# Patient Record
Sex: Female | Born: 1940 | Race: White | Hispanic: No | Marital: Married | Smoking: Former smoker
Health system: Southern US, Community
[De-identification: ages and names within clinical notes are randomized; demographics above are authoritative.]

## PROBLEM LIST (undated history)

## (undated) DIAGNOSIS — H25019 Cortical age-related cataract, unspecified eye: Secondary | ICD-10-CM

## (undated) DIAGNOSIS — R0600 Dyspnea, unspecified: Secondary | ICD-10-CM

## (undated) DIAGNOSIS — K219 Gastro-esophageal reflux disease without esophagitis: Secondary | ICD-10-CM

## (undated) DIAGNOSIS — E538 Deficiency of other specified B group vitamins: Secondary | ICD-10-CM

## (undated) DIAGNOSIS — E039 Hypothyroidism, unspecified: Secondary | ICD-10-CM

## (undated) DIAGNOSIS — J449 Chronic obstructive pulmonary disease, unspecified: Secondary | ICD-10-CM

## (undated) DIAGNOSIS — D649 Anemia, unspecified: Secondary | ICD-10-CM

## (undated) DIAGNOSIS — B029 Zoster without complications: Secondary | ICD-10-CM

## (undated) DIAGNOSIS — C801 Malignant (primary) neoplasm, unspecified: Secondary | ICD-10-CM

## (undated) DIAGNOSIS — J439 Emphysema, unspecified: Secondary | ICD-10-CM

## (undated) DIAGNOSIS — E785 Hyperlipidemia, unspecified: Secondary | ICD-10-CM

## (undated) DIAGNOSIS — I10 Essential (primary) hypertension: Secondary | ICD-10-CM

## (undated) DIAGNOSIS — M25551 Pain in right hip: Secondary | ICD-10-CM

## (undated) DIAGNOSIS — Z974 Presence of external hearing-aid: Secondary | ICD-10-CM

## (undated) DIAGNOSIS — I700 Atherosclerosis of aorta: Secondary | ICD-10-CM

## (undated) DIAGNOSIS — I2510 Atherosclerotic heart disease of native coronary artery without angina pectoris: Secondary | ICD-10-CM

## (undated) DIAGNOSIS — M1612 Unilateral primary osteoarthritis, left hip: Secondary | ICD-10-CM

## (undated) DIAGNOSIS — Z8719 Personal history of other diseases of the digestive system: Secondary | ICD-10-CM

## (undated) DIAGNOSIS — M810 Age-related osteoporosis without current pathological fracture: Secondary | ICD-10-CM

## (undated) HISTORY — PX: CATARACT EXTRACTION: SUR2

## (undated) HISTORY — PX: TUBAL LIGATION: SHX77

---

## 1968-04-05 HISTORY — PX: TUBAL LIGATION: SHX77

## 2004-03-27 ENCOUNTER — Ambulatory Visit: Payer: Self-pay | Admitting: Internal Medicine

## 2004-05-26 ENCOUNTER — Ambulatory Visit: Payer: Self-pay | Admitting: Internal Medicine

## 2004-11-11 ENCOUNTER — Ambulatory Visit: Payer: Self-pay

## 2005-02-04 ENCOUNTER — Ambulatory Visit: Payer: Self-pay | Admitting: Urology

## 2005-04-05 HISTORY — PX: COLONOSCOPY: SHX174

## 2005-05-27 ENCOUNTER — Ambulatory Visit: Payer: Self-pay | Admitting: Internal Medicine

## 2006-03-31 ENCOUNTER — Ambulatory Visit: Payer: Self-pay | Admitting: Gastroenterology

## 2006-05-30 ENCOUNTER — Ambulatory Visit: Payer: Self-pay | Admitting: Internal Medicine

## 2006-09-21 ENCOUNTER — Ambulatory Visit: Payer: Self-pay | Admitting: Internal Medicine

## 2007-06-27 ENCOUNTER — Ambulatory Visit: Payer: Self-pay | Admitting: Internal Medicine

## 2008-06-27 ENCOUNTER — Ambulatory Visit: Payer: Self-pay | Admitting: Internal Medicine

## 2009-07-14 ENCOUNTER — Ambulatory Visit: Payer: Self-pay | Admitting: Internal Medicine

## 2010-08-06 ENCOUNTER — Ambulatory Visit: Payer: Self-pay | Admitting: Internal Medicine

## 2010-08-15 ENCOUNTER — Emergency Department: Payer: Self-pay | Admitting: Unknown Physician Specialty

## 2010-09-06 ENCOUNTER — Ambulatory Visit: Payer: Self-pay | Admitting: Family Medicine

## 2011-08-12 ENCOUNTER — Ambulatory Visit: Payer: Self-pay | Admitting: Internal Medicine

## 2012-08-31 ENCOUNTER — Ambulatory Visit: Payer: Self-pay | Admitting: Internal Medicine

## 2013-01-09 ENCOUNTER — Ambulatory Visit: Payer: Self-pay | Admitting: Internal Medicine

## 2013-04-30 ENCOUNTER — Ambulatory Visit: Payer: Self-pay | Admitting: Internal Medicine

## 2013-09-25 ENCOUNTER — Ambulatory Visit: Payer: Self-pay | Admitting: Internal Medicine

## 2013-11-06 DIAGNOSIS — D519 Vitamin B12 deficiency anemia, unspecified: Secondary | ICD-10-CM | POA: Insufficient documentation

## 2013-11-11 ENCOUNTER — Ambulatory Visit: Payer: Self-pay | Admitting: Internal Medicine

## 2013-11-11 LAB — URINALYSIS, COMPLETE: WBC UR: >30 /HPF (ref 0–5)

## 2013-11-13 LAB — URINE CULTURE

## 2014-05-04 ENCOUNTER — Ambulatory Visit: Payer: Self-pay | Admitting: Emergency Medicine

## 2014-05-04 LAB — URINALYSIS, COMPLETE: WBC UR: >30 /HPF (ref 0–5)

## 2014-05-06 LAB — URINE CULTURE

## 2014-07-19 ENCOUNTER — Other Ambulatory Visit: Payer: Self-pay | Admitting: Internal Medicine

## 2014-07-19 DIAGNOSIS — Z1231 Encounter for screening mammogram for malignant neoplasm of breast: Secondary | ICD-10-CM

## 2014-10-01 ENCOUNTER — Ambulatory Visit
Admission: RE | Admit: 2014-10-01 | Discharge: 2014-10-01 | Disposition: A | Discharge status: Home / Self Care | Payer: Medicare PPO | Source: Ambulatory Visit | Attending: Internal Medicine | Admitting: Internal Medicine

## 2014-10-01 DIAGNOSIS — Z1231 Encounter for screening mammogram for malignant neoplasm of breast: Secondary | ICD-10-CM

## 2015-09-19 ENCOUNTER — Other Ambulatory Visit: Payer: Self-pay | Admitting: Internal Medicine

## 2015-09-19 DIAGNOSIS — Z1231 Encounter for screening mammogram for malignant neoplasm of breast: Secondary | ICD-10-CM

## 2015-10-22 ENCOUNTER — Ambulatory Visit
Admission: RE | Admit: 2015-10-22 | Discharge: 2015-10-22 | Disposition: A | Discharge status: Home / Self Care | Payer: Medicare PPO | Source: Ambulatory Visit | Attending: Internal Medicine | Admitting: Internal Medicine

## 2015-10-22 DIAGNOSIS — Z1231 Encounter for screening mammogram for malignant neoplasm of breast: Secondary | ICD-10-CM | POA: Insufficient documentation

## 2016-06-22 ENCOUNTER — Encounter: Payer: Self-pay | Admitting: *Deleted

## 2016-06-23 ENCOUNTER — Ambulatory Visit
Admission: RE | Admit: 2016-06-23 | Payer: Medicare HMO | Source: Ambulatory Visit | Admitting: Unknown Physician Specialty

## 2016-06-23 ENCOUNTER — Encounter: Admission: RE | Payer: Self-pay | Source: Ambulatory Visit

## 2016-06-23 ENCOUNTER — Encounter
Admission: RE | Disposition: A | Discharge status: Home / Self Care | Payer: Self-pay | Source: Ambulatory Visit | Attending: Unknown Physician Specialty

## 2016-06-23 ENCOUNTER — Ambulatory Visit: Payer: Medicare HMO | Admitting: Anesthesiology

## 2016-06-23 ENCOUNTER — Ambulatory Visit
Admission: RE | Admit: 2016-06-23 | Discharge: 2016-06-23 | Disposition: A | Discharge status: Home / Self Care | Payer: Medicare HMO | Source: Ambulatory Visit | Attending: Unknown Physician Specialty | Admitting: Unknown Physician Specialty

## 2016-06-23 ENCOUNTER — Encounter: Payer: Self-pay | Admitting: *Deleted

## 2016-06-23 DIAGNOSIS — Z79899 Other long term (current) drug therapy: Secondary | ICD-10-CM | POA: Insufficient documentation

## 2016-06-23 DIAGNOSIS — K5730 Diverticulosis of large intestine without perforation or abscess without bleeding: Secondary | ICD-10-CM | POA: Insufficient documentation

## 2016-06-23 DIAGNOSIS — K640 First degree hemorrhoids: Secondary | ICD-10-CM | POA: Diagnosis not present

## 2016-06-23 DIAGNOSIS — J449 Chronic obstructive pulmonary disease, unspecified: Secondary | ICD-10-CM | POA: Insufficient documentation

## 2016-06-23 DIAGNOSIS — E785 Hyperlipidemia, unspecified: Secondary | ICD-10-CM | POA: Diagnosis not present

## 2016-06-23 DIAGNOSIS — K621 Rectal polyp: Secondary | ICD-10-CM | POA: Insufficient documentation

## 2016-06-23 DIAGNOSIS — D649 Anemia, unspecified: Secondary | ICD-10-CM | POA: Insufficient documentation

## 2016-06-23 DIAGNOSIS — Z1211 Encounter for screening for malignant neoplasm of colon: Secondary | ICD-10-CM | POA: Insufficient documentation

## 2016-06-23 DIAGNOSIS — K219 Gastro-esophageal reflux disease without esophagitis: Secondary | ICD-10-CM | POA: Insufficient documentation

## 2016-06-23 DIAGNOSIS — Z8371 Family history of colonic polyps: Secondary | ICD-10-CM | POA: Insufficient documentation

## 2016-06-23 DIAGNOSIS — E039 Hypothyroidism, unspecified: Secondary | ICD-10-CM | POA: Insufficient documentation

## 2016-06-23 DIAGNOSIS — Z87891 Personal history of nicotine dependence: Secondary | ICD-10-CM | POA: Diagnosis not present

## 2016-06-23 HISTORY — PX: COLONOSCOPY: SHX5424

## 2016-06-23 HISTORY — DX: Hypothyroidism, unspecified: E03.9

## 2016-06-23 HISTORY — DX: Gastro-esophageal reflux disease without esophagitis: K21.9

## 2016-06-23 HISTORY — DX: Hyperlipidemia, unspecified: E78.5

## 2016-06-23 HISTORY — DX: Anemia, unspecified: D64.9

## 2016-06-23 SURGERY — COLONOSCOPY WITH PROPOFOL
Anesthesia: General

## 2016-06-23 SURGERY — COLONOSCOPY
Anesthesia: General

## 2016-06-23 MED ORDER — LIDOCAINE HCL (PF) 2 % IJ SOLN
INTRAMUSCULAR | Status: AC
  Filled 2016-06-23: qty 2

## 2016-06-23 MED ORDER — FENTANYL CITRATE (PF) 100 MCG/2ML IJ SOLN
INTRAMUSCULAR | Status: DC | PRN
  Administered 2016-06-23 (×2): 50 ug via INTRAVENOUS

## 2016-06-23 MED ORDER — PROPOFOL 10 MG/ML IV BOLUS
INTRAVENOUS | Status: DC | PRN
  Administered 2016-06-23: 10 mg via INTRAVENOUS
  Administered 2016-06-23: 30 mg via INTRAVENOUS

## 2016-06-23 MED ORDER — SODIUM CHLORIDE 0.9 % IV SOLN: INTRAVENOUS | Status: DC

## 2016-06-23 MED ORDER — LIDOCAINE HCL (PF) 2 % IJ SOLN
INTRAMUSCULAR | Status: DC | PRN
Start: 2016-06-23 — End: 2016-06-23
  Administered 2016-06-23: 50 mg

## 2016-06-23 MED ORDER — MIDAZOLAM HCL 2 MG/2ML IJ SOLN
INTRAMUSCULAR | Status: AC
  Filled 2016-06-23: qty 2

## 2016-06-23 MED ORDER — PROPOFOL 500 MG/50ML IV EMUL
INTRAVENOUS | Status: DC | PRN
  Administered 2016-06-23: 50 ug/kg/min via INTRAVENOUS

## 2016-06-23 MED ORDER — MIDAZOLAM HCL 5 MG/5ML IJ SOLN
INTRAMUSCULAR | Status: DC | PRN
  Administered 2016-06-23 (×2): 1 mg via INTRAVENOUS

## 2016-06-23 MED ORDER — SODIUM CHLORIDE 0.9 % IV SOLN
INTRAVENOUS | Status: DC
  Administered 2016-06-23: 1000 mL via INTRAVENOUS

## 2016-06-23 MED ORDER — FENTANYL CITRATE (PF) 100 MCG/2ML IJ SOLN
INTRAMUSCULAR | Status: AC
  Filled 2016-06-23: qty 2

## 2016-06-23 NOTE — Anesthesia Preprocedure Evaluation (Signed)
Anesthesia Evaluation  Patient identified by MRN, date of birth, ID band Patient awake    Reviewed: Allergy & Precautions, NPO status , Patient's Chart, lab work & pertinent test results  Airway Mallampati: II       Dental  (+) Teeth Intact   Pulmonary neg pulmonary ROS, COPD, former smoker,     + decreased breath sounds      Cardiovascular Exercise Tolerance: Good  Rhythm:Regular     Neuro/Psych negative neurological ROS     GI/Hepatic Neg liver ROS, GERD  Medicated,  Endo/Other    Renal/GU negative Renal ROS     Musculoskeletal   Abdominal Normal abdominal exam  (+)   Peds  Hematology  (+) anemia ,   Anesthesia Other Findings   Reproductive/Obstetrics                             Anesthesia Physical Anesthesia Plan  ASA: II  Anesthesia Plan: General   Post-op Pain Management:    Induction: Intravenous  Airway Management Planned: Natural Airway and Nasal Cannula  Additional Equipment:   Intra-op Plan:   Post-operative Plan:   Informed Consent: I have reviewed the patients History and Physical, chart, labs and discussed the procedure including the risks, benefits and alternatives for the proposed anesthesia with the patient or authorized representative who has indicated his/her understanding and acceptance.     Plan Discussed with: CRNA  Anesthesia Plan Comments:         Anesthesia Quick Evaluation

## 2016-06-23 NOTE — Transfer of Care (Signed)
Immediate Anesthesia Transfer of Care Note  Patient: Kelly Wood  Procedure(s) Performed: Procedure(s): COLONOSCOPY (N/A)  Patient Location: PACU  Anesthesia Type:General  Level of Consciousness: sedated  Airway & Oxygen Therapy: Patient Spontanous Breathing and Patient connected to nasal cannula oxygen  Post-op Assessment: Report given to RN and Post -op Vital signs reviewed and stable  Post vital signs: Reviewed and stable  Last Vitals:  Vitals:   06/23/16 0849 06/23/16 0953  BP: 138/86 98/62  Pulse: 68 64  Resp: (!) 22 11  Temp: 36.3 C (!) 36.1 C    Last Pain:  Vitals:   06/23/16 0953  TempSrc: Tympanic         Complications: No apparent anesthesia complications

## 2016-06-23 NOTE — Op Note (Signed)
[facility] Patient Name: Kelly Wood Procedure Date: 06/23/2016 9:07 AM MRN: [id number] Account #: [id number] Date of Birth: 09-07-1940 Admit Type: Outpatient Age: 76 Room: [facility] ENDO ROOM 4 Gender: Female Note Status: Finalized Procedure:            Colonoscopy Indications:          Colon cancer screening in patient at increased risk:                        Family history of 1st-degree relative with colon polyps Providers:            Manya Silvas, MD Referring MD:         Leonie Douglas. Doy Hutching, MD (Referring MD) Medicines:            Propofol per Anesthesia Complications:        No immediate complications. Procedure:            Pre-Anesthesia Assessment:                       - After reviewing the risks and benefits, the patient                        was deemed in satisfactory condition to undergo the                        procedure.                       After obtaining informed consent, the colonoscope was                        passed under direct vision. Throughout the procedure,                        the patient's blood pressure, pulse, and oxygen                        saturations were monitored continuously. The Olympus                        CF-HQ190L Colonoscope (S#. [id number]) was introduced                        through the anus and advanced to the the cecum,                        identified by appendiceal orifice and ileocecal valve.                        The colonoscopy was performed without difficulty. The                        patient tolerated the procedure well. The quality of                        the bowel preparation was excellent. Findings:      A small polyp was found in the recto-sigmoid colon. The polyp was       sessile. The polyp was removed with a hot snare. Resection and retrieval       were complete.  Many small and large-mouthed diverticula were found in the sigmoid       colon, descending colon and  transverse colon.      Internal hemorrhoids were found during endoscopy. The hemorrhoids were       small and Grade I (internal hemorrhoids that do not prolapse).      The exam was otherwise without abnormality. Impression:           - One small polyp at the recto-sigmoid colon, removed                        with a hot snare. Resected and retrieved.                       - Diverticulosis in the sigmoid colon, in the                        descending colon and in the transverse colon.                       - Internal hemorrhoids.                       - The examination was otherwise normal. Recommendation:       - Await pathology results. Manya Silvas, MD 06/23/2016 9:55:03 AM This report has been signed electronically. Number of Addenda: 0 Note Initiated On: 06/23/2016 9:07 AM Scope Withdrawal Time: 0 hours 10 minutes 58 seconds  Total Procedure Duration: 0 hours 27 minutes 21 seconds       [facility]

## 2016-06-23 NOTE — H&P (Signed)
   Primary Care Physician:  Idelle Crouch, MD Primary Gastroenterologist:  Dr. Vira Agar  Pre-Procedure History & Physical: HPI:  Kelly Wood is a 76 y.o. female is here for an colonoscopy.   Past Medical History:  Diagnosis Date  . Anemia   . GERD (gastroesophageal reflux disease)   . Hyperlipidemia   . Hypothyroidism     Past Surgical History:  Procedure Laterality Date  . TUBAL LIGATION      Prior to Admission medications   Medication Sig Start Date End Date Taking? Authorizing Provider  diltiazem (CARDIZEM CD) 240 MG 24 hr capsule Take 240 mg by mouth daily.   Yes Historical Provider, MD  levothyroxine (SYNTHROID, LEVOTHROID) 75 MCG tablet Take 75 mcg by mouth daily before breakfast.   Yes Historical Provider, MD  potassium chloride SA (K-DUR,KLOR-CON) 20 MEQ tablet Take 20 mEq by mouth 2 (two) times daily.   Yes Historical Provider, MD  pravastatin (PRAVACHOL) 80 MG tablet Take 80 mg by mouth daily.   Yes Historical Provider, MD  torsemide (DEMADEX) 20 MG tablet Take 20 mg by mouth daily.   Yes Historical Provider, MD    Allergies as of 05/25/2016  . (Not on File)    Family History  Problem Relation Age of Onset  . Breast cancer Neg Hx     Social History   Social History  . Marital status: Married    Spouse name: N/A  . Number of children: N/A  . Years of education: N/A   Occupational History  . Not on file.   Social History Main Topics  . Smoking status: Former Smoker    Quit date: 10/24/2006  . Smokeless tobacco: Never Used  . Alcohol use Not on file  . Drug use: Unknown  . Sexual activity: Not on file   Other Topics Concern  . Not on file   Social History Narrative  . No narrative on file    Review of Systems: See HPI, otherwise negative ROS  Physical Exam: BP 138/86   Pulse 68   Temp 97.3 F (36.3 C) (Tympanic)   Resp (!) 22   Ht 5\' 1"  (1.549 m)   Wt 63.5 kg (140 lb)   SpO2 98%   BMI 26.45 kg/m  General:   Alert,  pleasant and  cooperative in NAD Head:  Normocephalic and atraumatic. Neck:  Supple; no masses or thyromegaly. Lungs:  Clear throughout to auscultation.    Heart:  Regular rate and rhythm. Abdomen:  Soft, nontender and nondistended. Normal bowel sounds, without guarding, and without rebound.   Neurologic:  Alert and  oriented x4;  grossly normal neurologically.  Impression/Plan: Kelly Wood is here for an colonoscopy to be performed for screening for family history of colon polyps  Risks, benefits, limitations, and alternatives regarding  colonoscopy have been reviewed with the patient.  Questions have been answered.  All parties agreeable.   Gaylyn Cheers, MD  06/23/2016, 9:15 AM

## 2016-06-23 NOTE — Anesthesia Postprocedure Evaluation (Signed)
Anesthesia Post Note  Patient: Kelly Wood  Procedure(s) Performed: Procedure(s) (LRB): COLONOSCOPY (N/A)  Patient location during evaluation: PACU Anesthesia Type: General Level of consciousness: awake Pain management: pain level controlled Vital Signs Assessment: post-procedure vital signs reviewed and stable Respiratory status: spontaneous breathing Cardiovascular status: stable Anesthetic complications: no     Last Vitals:  Vitals:   06/23/16 1013 06/23/16 1023  BP: 137/77 (!) 142/79  Pulse: 60 63  Resp: 14 13  Temp:      Last Pain:  Vitals:   06/23/16 1023  TempSrc:   PainSc: 0-No pain                 [name],Kelly Fasching

## 2016-06-23 NOTE — Anesthesia Post-op Follow-up Note (Cosign Needed)
Anesthesia QCDR form completed.        

## 2016-06-24 ENCOUNTER — Encounter: Payer: Self-pay | Admitting: Unknown Physician Specialty

## 2016-06-24 LAB — SURGICAL PATHOLOGY

## 2016-07-14 ENCOUNTER — Encounter: Payer: Self-pay | Admitting: *Deleted

## 2016-07-14 ENCOUNTER — Ambulatory Visit
Admission: EM | Admit: 2016-07-14 | Discharge: 2016-07-14 | Disposition: A | Discharge status: Home / Self Care | Payer: Medicare HMO | Attending: Family Medicine | Admitting: Family Medicine

## 2016-07-14 DIAGNOSIS — H6692 Otitis media, unspecified, left ear: Secondary | ICD-10-CM

## 2016-07-14 DIAGNOSIS — H6123 Impacted cerumen, bilateral: Secondary | ICD-10-CM

## 2016-07-14 MED ORDER — AMOXICILLIN 875 MG PO TABS: 875.0000 mg | ORAL_TABLET | Freq: Two times a day (BID) | ORAL | 0 refills | Status: DC

## 2016-07-14 NOTE — ED Triage Notes (Signed)
Left ear pain and fullness x 2 days.

## 2016-07-14 NOTE — Discharge Instructions (Signed)
Take medication as prescribed. Rest. Drink plenty of fluids.  ° °Follow up with your primary care physician this week as needed. Return to Urgent care for new or worsening concerns.  ° °

## 2016-07-14 NOTE — ED Provider Notes (Signed)
[facility] ____________________________________________  Time seen: Approximately 11:42 AM  I have reviewed the triage vital signs and the nursing notes.   HISTORY  Chief Complaint Otalgia   HPI Kelly Wood is a 76 y.o. female presenting for evaluation of left ear discomfort and stopped up feeling. Patient reports that she has had a similar sensation in the past with cerumen impaction, but reports this time has more tenderness. Denies any recent injury or trauma. Denies tinnitus. Denies pain radiation. States mild left ear pain at this time. Has not tried any over-the-counter medications or treatments. Patient denies fevers. Reports otherwise feels well. Denies recent sickness, cough, congestion or sore throat.  Denies chest pain, shortness of breath, abdominal pain, or rash. Denies recent sickness. Denies recent antibiotic use. Denies renal insufficiency.   SPARKS,JEFFREY D, MD: PCP   Past Medical History:  Diagnosis Date  . Anemia   . GERD (gastroesophageal reflux disease)   . Hyperlipidemia   . Hypothyroidism     There are no active problems to display for this patient.   Past Surgical History:  Procedure Laterality Date  . COLONOSCOPY N/A 06/23/2016   Procedure: COLONOSCOPY;  Surgeon: Manya Silvas, MD;  Location: [facility] ENDOSCOPY;  Service: Endoscopy;  Laterality: N/A;  . TUBAL LIGATION       No current facility-administered medications for this encounter.   Current Outpatient Prescriptions:  .  diltiazem (CARDIZEM CD) 240 MG 24 hr capsule, Take 240 mg by mouth daily., Disp: , Rfl:  .  levothyroxine (SYNTHROID, LEVOTHROID) 75 MCG tablet, Take 75 mcg by mouth daily before breakfast., Disp: , Rfl:  .  potassium chloride SA (K-DUR,KLOR-CON) 20 MEQ tablet, Take 20 mEq by mouth 2 (two) times daily., Disp: , Rfl:  .  pravastatin (PRAVACHOL) 80 MG tablet, Take 80 mg by mouth daily., Disp: , Rfl:  .  torsemide (DEMADEX) 20 MG tablet, Take 20 mg by mouth  daily., Disp: , Rfl:  .  amoxicillin (AMOXIL) 875 MG tablet, Take 1 tablet (875 mg total) by mouth 2 (two) times daily., Disp: 20 tablet, Rfl: 0  Allergies Sulfa antibiotics  Family History  Problem Relation Age of Onset  . Breast cancer Neg Hx     Social History Social History  Substance Use Topics  . Smoking status: Former Smoker    Quit date: 10/24/2006  . Smokeless tobacco: Never Used  . Alcohol use No    Review of Systems Constitutional: No fever/chills Eyes: No visual changes. ENT: No sore throat. As above.  Cardiovascular: Denies chest pain. Respiratory: Denies shortness of breath. Gastrointestinal: No abdominal pain.   Genitourinary: Negative for dysuria. Musculoskeletal: Negative for back pain. Skin: Negative for rash. ____________________________________________   PHYSICAL EXAM:  VITAL SIGNS: ED Triage Vitals  Enc Vitals Group     BP 07/14/16 1106 127/62     Pulse Rate 07/14/16 1106 64     Resp 07/14/16 1106 16     Temp 07/14/16 1106 97.7 F (36.5 C)     Temp Source 07/14/16 1106 Oral     SpO2 07/14/16 1106 100 %     Weight 07/14/16 1108 140 lb (63.5 kg)     Height 07/14/16 1108 5\' 1"  (1.549 m)     Head Circumference --      Peak Flow --      Pain Score --      Pain Loc --      Pain Edu? --      Excl. in [place]? --  Constitutional: Alert and oriented. Well appearing and in no acute distress. Eyes: Conjunctivae are normal. PERRL. EOMI. ENT      Head: Normocephalic and atraumatic.      Ears: Right: Nontender, total cerumen impaction. Left: Nontender, cerumen impaction present. Post cerumen removal with irrigation as well as curette reevaluated. Right: Canal clear, no erythema, normal TM. Left: Canal clear, moderate erythema and dullness. The surrounding tenderness, swelling or erythema bilaterally.      Nose: No congestion/rhinnorhea.      Mouth/Throat: Mucous membranes are moist.Oropharynx non-erythematous. Neck: No stridor. Supple without  meningismus.  Hematological/Lymphatic/Immunilogical: No cervical lymphadenopathy. Cardiovascular: Normal rate, regular rhythm. Grossly normal heart sounds.  Good peripheral circulation. Respiratory: Normal respiratory effort without tachypnea nor retractions. Breath sounds are clear and equal bilaterally. No wheezes, rales, rhonchi. Musculoskeletal:No midline cervical, thoracic or lumbar tenderness to palpation.  Neurologic:  Normal speech and language. Speech is normal. No gait instability.  Skin:  Skin is warm, dry and intact. No rash noted. Psychiatric: Mood and affect are normal. Speech and behavior are normal. Patient exhibits appropriate insight and judgment   ___________________________________________   LABS (all labs ordered are listed, but only abnormal results are displayed)  Labs Reviewed - No data to display  PROCEDURES Procedures   Ceruminosis is noted. Procedure explained and verbal consent obtained. Wax is removed by irrigation and curette manual debridement. Patient tolerated well. Instructions for home care to prevent wax buildup are given. __________________________________________   INITIAL IMPRESSION / ASSESSMENT AND PLAN / ED COURSE  Pertinent labs & imaging results that were available during my care of the patient were reviewed by me and considered in my medical decision making (see chart for details).  Well appearing patient. No acute distress. Cerumen impaction removed. Patient tolerated well. Left otitis media. Discussed with patient monitoring and conservative therapy and may resolve without antibiotics, patient expressed concern of not previously having pain in the left ear and owuld like to start antibiotic prescription, will treat patient with oral amoxicillin.Discussed indication, risks and benefits of medications with patient.  Discussed follow up with Primary care physician this week. Discussed follow up and return parameters including no resolution or  any worsening concerns. Patient verbalized understanding and agreed to plan.   ____________________________________________   FINAL CLINICAL IMPRESSION(S) / ED DIAGNOSES  Final diagnoses:  Left otitis media, unspecified otitis media type  Bilateral impacted cerumen     Discharge Medication List as of 07/14/2016 12:22 PM    START taking these medications   Details  amoxicillin (AMOXIL) 875 MG tablet Take 1 tablet (875 mg total) by mouth 2 (two) times daily., Starting Wed 07/14/2016, Normal        Note: This dictation was prepared with Dragon dictation along with smaller phrase technology. Any transcriptional errors that result from this process are unintentional.         Marylene Land, NP 07/14/16 1610

## 2016-11-24 ENCOUNTER — Other Ambulatory Visit: Payer: Self-pay | Admitting: Internal Medicine

## 2016-11-24 DIAGNOSIS — Z1231 Encounter for screening mammogram for malignant neoplasm of breast: Secondary | ICD-10-CM

## 2016-12-15 ENCOUNTER — Ambulatory Visit
Admission: RE | Admit: 2016-12-15 | Discharge: 2016-12-15 | Disposition: A | Discharge status: Home / Self Care | Payer: Medicare HMO | Source: Ambulatory Visit | Attending: Internal Medicine | Admitting: Internal Medicine

## 2016-12-15 DIAGNOSIS — Z1231 Encounter for screening mammogram for malignant neoplasm of breast: Secondary | ICD-10-CM | POA: Diagnosis present

## 2017-07-08 ENCOUNTER — Ambulatory Visit
Admission: EM | Admit: 2017-07-08 | Discharge: 2017-07-08 | Disposition: A | Discharge status: Home / Self Care | Payer: Medicare HMO | Attending: Emergency Medicine | Admitting: Emergency Medicine

## 2017-07-08 ENCOUNTER — Other Ambulatory Visit: Payer: Self-pay

## 2017-07-08 DIAGNOSIS — J0100 Acute maxillary sinusitis, unspecified: Secondary | ICD-10-CM | POA: Diagnosis not present

## 2017-07-08 DIAGNOSIS — R51 Headache: Secondary | ICD-10-CM | POA: Diagnosis not present

## 2017-07-08 DIAGNOSIS — R519 Headache, unspecified: Secondary | ICD-10-CM

## 2017-07-08 MED ORDER — FLUTICASONE PROPIONATE 50 MCG/ACT NA SUSP: 2.0000 | Freq: Every day | NASAL | 0 refills | Status: DC

## 2017-07-08 MED ORDER — GUAIFENESIN ER 600 MG PO TB12: 1200.0000 mg | ORAL_TABLET | Freq: Two times a day (BID) | ORAL | 0 refills | Status: AC

## 2017-07-08 NOTE — ED Provider Notes (Signed)
HPI  SUBJECTIVE:  Kelly Wood is a 77 y.o. female who presents with diffuse, gradual onset, intermittent, hours long headaches and sinus pain and pressure, mild nasal congestion, postnasal drip, sore, scratchy irritated throat starting 2 days ago.  She reports nonproductive cough and fatigue.  No rhinorrhea, fevers, upper dental pain, facial swelling.  No allergy symptoms.  No body aches, flulike symptoms.  No wheezing, chest pain, shortness of breath.  She tried ibuprofen 400 mg with complete resolution of her headache.  Her last dose was last night.  No neck stiffness, photophobia, rash, ear pain.  She has history of atrial fibrillation or SVT, not sure which one, and is on Cardizem for this.  Also hypercholesterolemia, hypothyroidism.  No history of hypertension, allergies, sinusitis, pulmonary disease.  She quit smoking years ago.  Has been caring for her husband over the past 2 weeks who is status post shoulder surgery.  States that she is not getting a whole lot of rest.  XFG:[id number], Leonie Douglas, MD   Past Medical History:  Diagnosis Date  . Anemia   . GERD (gastroesophageal reflux disease)   . Hyperlipidemia   . Hypothyroidism     Past Surgical History:  Procedure Laterality Date  . COLONOSCOPY N/A 06/23/2016   Procedure: COLONOSCOPY;  Surgeon: Manya Silvas, MD;  Location: [facility] ENDOSCOPY;  Service: Endoscopy;  Laterality: N/A;  . TUBAL LIGATION      Family History  Problem Relation Age of Onset  . Breast cancer Neg Hx     Social History   Tobacco Use  . Smoking status: Former Smoker    Last attempt to quit: 10/24/2006    Years since quitting: 10.7  . Smokeless tobacco: Never Used  Substance Use Topics  . Alcohol use: Yes    Comment: rarely  . Drug use: Never    No current facility-administered medications for this encounter.   Current Outpatient Medications:  .  diltiazem (CARDIZEM CD) 240 MG 24 hr capsule, Take 240 mg by mouth daily., Disp: , Rfl:  .   levothyroxine (SYNTHROID, LEVOTHROID) 75 MCG tablet, Take 75 mcg by mouth daily before breakfast., Disp: , Rfl:  .  potassium chloride SA (K-DUR,KLOR-CON) 20 MEQ tablet, Take 20 mEq by mouth 2 (two) times daily., Disp: , Rfl:  .  pravastatin (PRAVACHOL) 80 MG tablet, Take 80 mg by mouth daily., Disp: , Rfl:  .  torsemide (DEMADEX) 20 MG tablet, Take 20 mg by mouth daily., Disp: , Rfl:  .  fluticasone (FLONASE) 50 MCG/ACT nasal spray, Place 2 sprays into both nostrils daily., Disp: 16 g, Rfl: 0 .  guaiFENesin (MUCINEX) 600 MG 12 hr tablet, Take 2 tablets (1,200 mg total) by mouth 2 (two) times daily for 10 days., Disp: 40 tablet, Rfl: 0  Allergies  Allergen Reactions  . Sulfa Antibiotics      ROS  As noted in HPI.   Physical Exam  BP 120/84 (BP Location: Left Arm)   Pulse 73   Temp 98.9 F (37.2 C) (Oral)   Resp 18   Ht 5' (1.524 m)   Wt 135 lb (61.2 kg)   SpO2 98%   BMI 26.37 kg/m   Constitutional: Well developed, well nourished, no acute distress Eyes:  EOMI, conjunctiva normal bilaterally HENT: Normocephalic, atraumatic,mucus membranes moist.  Mucoid nasal congestion.  Erythematous, swollen turbinates.  No frontal sinus tenderness.  Positive maxillary sinus tenderness.  No obvious postnasal drip.  Oropharynx normal. Neck: No cervical lymphadenopathy, meningismus.  No  trapezial tenderness. Respiratory: Normal inspiratory effort Cardiovascular: Normal rate GI: nondistended skin: No rash, skin intact Musculoskeletal: no deformities Neurologic: Alert & oriented x 3, no focal neuro deficits no nerves II through XII intact grossly. Psychiatric: Speech and behavior appropriate   ED Course   Medications - No data to display  No orders of the defined types were placed in this encounter.   No results found for this or any previous visit (from the past 24 hour(s)). No results found.  ED Clinical Impression  Acute non-recurrent maxillary sinusitis  Acute nonintractable  headache, unspecified headache type   ED Assessment/Plan  Patient Hayden Pedro consistent with a viral sinusitis.  Also suspect that the headache is from not getting enough sleep recently.  There is not appear to be any evidence of otitis, pharyngitis, meningitis, or other serious cause of her headache.  Will send home with Flonase, Mucinex, saline nasal irrigation with a Milta Deiters med sinus rinse, 400 mg of ibuprofen to take with 500 mg of Tylenol 3-4 times a day as needed.  Follow-up with PMD or may return here if not better in 7-10 days or or if she starts to have a fever above 100.4 we can consider antibiotics at that time.  Discussed signs and symptoms that should prompt return to the emergency department.  Discussed medical decision making, treatment plan, plan for follow-up with patient.  She agrees the plan.   Meds ordered this encounter  Medications  . fluticasone (FLONASE) 50 MCG/ACT nasal spray    Sig: Place 2 sprays into both nostrils daily.    Dispense:  16 g    Refill:  0  . guaiFENesin (MUCINEX) 600 MG 12 hr tablet    Sig: Take 2 tablets (1,200 mg total) by mouth 2 (two) times daily for 10 days.    Dispense:  40 tablet    Refill:  0    *This clinic note was created using [employer]. Therefore, there may be occasional mistakes despite careful proofreading.   ?    Melynda Ripple, MD 07/08/17 1011

## 2017-07-08 NOTE — Discharge Instructions (Addendum)
Flonase, Mucinex, saline nasal irrigation with a Milta Deiters med sinus rinse, follow the directions on the box.  You may do this as often as you want., 400 mg of ibuprofen to take with 500 mg of Tylenol 3-4 times a day as needed.  Follow-up with Dr. Doy Hutching or may return here if not better in 1 week or if you start to to have a fever above 100.4 , and we can consider antibiotics at that time.

## 2017-07-08 NOTE — ED Triage Notes (Signed)
Patient complains of congestion, scratchy throat, body aches, fever, fatigue, cough x 3-4 days.

## 2017-10-12 ENCOUNTER — Other Ambulatory Visit: Payer: Self-pay | Admitting: Internal Medicine

## 2017-10-12 DIAGNOSIS — Z1231 Encounter for screening mammogram for malignant neoplasm of breast: Secondary | ICD-10-CM

## 2017-12-19 ENCOUNTER — Ambulatory Visit
Admission: RE | Admit: 2017-12-19 | Discharge: 2017-12-19 | Disposition: A | Discharge status: Home / Self Care | Payer: Medicare HMO | Source: Ambulatory Visit | Attending: Internal Medicine | Admitting: Internal Medicine

## 2017-12-19 DIAGNOSIS — Z1231 Encounter for screening mammogram for malignant neoplasm of breast: Secondary | ICD-10-CM | POA: Insufficient documentation

## 2018-11-28 ENCOUNTER — Other Ambulatory Visit: Payer: Self-pay | Admitting: Internal Medicine

## 2018-11-28 DIAGNOSIS — Z1231 Encounter for screening mammogram for malignant neoplasm of breast: Secondary | ICD-10-CM

## 2018-12-26 ENCOUNTER — Encounter
Admission: EM | Disposition: A | Discharge status: Home Health | Payer: Self-pay | Source: Home / Self Care | Attending: Internal Medicine

## 2018-12-26 ENCOUNTER — Inpatient Hospital Stay: Payer: Medicare HMO

## 2018-12-26 ENCOUNTER — Inpatient Hospital Stay: Payer: Medicare HMO | Admitting: Anesthesiology

## 2018-12-26 ENCOUNTER — Emergency Department: Payer: Medicare HMO

## 2018-12-26 ENCOUNTER — Other Ambulatory Visit: Payer: Self-pay

## 2018-12-26 ENCOUNTER — Ambulatory Visit: Payer: Medicare HMO

## 2018-12-26 ENCOUNTER — Inpatient Hospital Stay
Admission: EM | Admit: 2018-12-26 | Discharge: 2018-12-29 | DRG: 481 | Disposition: A | Discharge status: Home Health | Payer: Medicare HMO | Attending: Internal Medicine | Admitting: Internal Medicine

## 2018-12-26 DIAGNOSIS — Z87891 Personal history of nicotine dependence: Secondary | ICD-10-CM | POA: Diagnosis not present

## 2018-12-26 DIAGNOSIS — Z23 Encounter for immunization: Secondary | ICD-10-CM

## 2018-12-26 DIAGNOSIS — E785 Hyperlipidemia, unspecified: Secondary | ICD-10-CM | POA: Diagnosis present

## 2018-12-26 DIAGNOSIS — E039 Hypothyroidism, unspecified: Secondary | ICD-10-CM | POA: Diagnosis present

## 2018-12-26 DIAGNOSIS — S72001A Fracture of unspecified part of neck of right femur, initial encounter for closed fracture: Secondary | ICD-10-CM

## 2018-12-26 DIAGNOSIS — Z20828 Contact with and (suspected) exposure to other viral communicable diseases: Secondary | ICD-10-CM | POA: Diagnosis present

## 2018-12-26 DIAGNOSIS — M25551 Pain in right hip: Secondary | ICD-10-CM | POA: Diagnosis present

## 2018-12-26 DIAGNOSIS — W108XXA Fall (on) (from) other stairs and steps, initial encounter: Secondary | ICD-10-CM | POA: Diagnosis present

## 2018-12-26 DIAGNOSIS — I10 Essential (primary) hypertension: Secondary | ICD-10-CM | POA: Diagnosis present

## 2018-12-26 DIAGNOSIS — D62 Acute posthemorrhagic anemia: Secondary | ICD-10-CM | POA: Diagnosis not present

## 2018-12-26 DIAGNOSIS — Y92008 Other place in unspecified non-institutional (private) residence as the place of occurrence of the external cause: Secondary | ICD-10-CM | POA: Diagnosis not present

## 2018-12-26 DIAGNOSIS — Z79890 Hormone replacement therapy (postmenopausal): Secondary | ICD-10-CM

## 2018-12-26 DIAGNOSIS — Z79899 Other long term (current) drug therapy: Secondary | ICD-10-CM | POA: Diagnosis not present

## 2018-12-26 DIAGNOSIS — S72141A Displaced intertrochanteric fracture of right femur, initial encounter for closed fracture: Principal | ICD-10-CM | POA: Diagnosis present

## 2018-12-26 DIAGNOSIS — Z7951 Long term (current) use of inhaled steroids: Secondary | ICD-10-CM

## 2018-12-26 DIAGNOSIS — Z7982 Long term (current) use of aspirin: Secondary | ICD-10-CM | POA: Diagnosis not present

## 2018-12-26 DIAGNOSIS — Z7983 Long term (current) use of bisphosphonates: Secondary | ICD-10-CM

## 2018-12-26 DIAGNOSIS — K219 Gastro-esophageal reflux disease without esophagitis: Secondary | ICD-10-CM | POA: Diagnosis present

## 2018-12-26 DIAGNOSIS — Z419 Encounter for procedure for purposes other than remedying health state, unspecified: Secondary | ICD-10-CM

## 2018-12-26 DIAGNOSIS — W19XXXA Unspecified fall, initial encounter: Secondary | ICD-10-CM

## 2018-12-26 HISTORY — PX: INTRAMEDULLARY (IM) NAIL INTERTROCHANTERIC: SHX5875

## 2018-12-26 LAB — CBC WITH DIFFERENTIAL/PLATELET
Abs Immature Granulocytes: 0.04 10*3/uL (ref 0.00–0.07)
Basophils Absolute: 0 10*3/uL (ref 0.0–0.1)
Basophils Relative: 1 %
Eosinophils Absolute: 0.3 10*3/uL (ref 0.0–0.5)
Eosinophils Relative: 4 %
HCT: 40.4 % (ref 36.0–46.0)
Hemoglobin: 12.8 g/dL (ref 12.0–15.0)
Immature Granulocytes: 1 %
Lymphocytes Relative: 36 %
Lymphs Abs: 3 10*3/uL (ref 0.7–4.0)
MCH: 27.9 pg (ref 26.0–34.0)
MCHC: 31.7 g/dL (ref 30.0–36.0)
MCV: 88.2 fL (ref 80.0–100.0)
Monocytes Absolute: 0.6 10*3/uL (ref 0.1–1.0)
Monocytes Relative: 7 %
Neutro Abs: 4.3 10*3/uL (ref 1.7–7.7)
Neutrophils Relative %: 51 %
Platelets: 205 10*3/uL (ref 150–400)
RBC: 4.58 MIL/uL (ref 3.87–5.11)
RDW: 13 % (ref 11.5–15.5)
WBC: 8.2 10*3/uL (ref 4.0–10.5)
nRBC: 0 % (ref 0.0–0.2)

## 2018-12-26 LAB — BASIC METABOLIC PANEL
Anion gap: 11 (ref 5–15)
BUN: 17 mg/dL (ref 8–23)
CO2: 23 mmol/L (ref 22–32)
Calcium: 9.3 mg/dL (ref 8.9–10.3)
Chloride: 105 mmol/L (ref 98–111)
Creatinine, Ser: 0.62 mg/dL (ref 0.44–1.00)
GFR calc Af Amer: >60 mL/min (ref >60)
GFR calc non Af Amer: >60 mL/min (ref >60)
Glucose, Bld: 112 mg/dL — ABNORMAL HIGH (ref 70–99)
Potassium: 3.8 mmol/L (ref 3.5–5.1)
Sodium: 139 mmol/L (ref 135–145)

## 2018-12-26 LAB — SARS CORONAVIRUS 2 BY RT PCR (HOSPITAL ORDER, PERFORMED IN ~~LOC~~ HOSPITAL LAB): SARS Coronavirus 2: NEGATIVE

## 2018-12-26 SURGERY — FIXATION, FRACTURE, INTERTROCHANTERIC, WITH INTRAMEDULLARY ROD
Anesthesia: Spinal | Laterality: Right

## 2018-12-26 MED ORDER — HYDROCODONE-ACETAMINOPHEN 5-325 MG PO TABS
1.0000 | ORAL_TABLET | ORAL | Status: DC | PRN
  Administered 2018-12-27 (×2): 2 via ORAL
  Administered 2018-12-27: 02:00:00 1 via ORAL
  Filled 2018-12-26 (×2): qty 2
  Filled 2018-12-26: qty 1

## 2018-12-26 MED ORDER — ONDANSETRON HCL 4 MG PO TABS: 4.0000 mg | ORAL_TABLET | Freq: Four times a day (QID) | ORAL | Status: DC | PRN

## 2018-12-26 MED ORDER — PROPOFOL 500 MG/50ML IV EMUL
INTRAVENOUS | Status: AC
  Filled 2018-12-26: qty 50

## 2018-12-26 MED ORDER — PNEUMOCOCCAL VAC POLYVALENT 25 MCG/0.5ML IJ INJ
0.5000 mL | INJECTION | INTRAMUSCULAR | Status: AC
  Administered 2018-12-27: 13:00:00 0.5 mL via INTRAMUSCULAR
  Filled 2018-12-26: qty 0.5

## 2018-12-26 MED ORDER — FLUTICASONE PROPIONATE 50 MCG/ACT NA SUSP
2.0000 | Freq: Every day | NASAL | Status: DC | PRN
  Filled 2018-12-26: qty 16

## 2018-12-26 MED ORDER — ASPIRIN EC 81 MG PO TBEC
81.0000 mg | DELAYED_RELEASE_TABLET | Freq: Every day | ORAL | Status: DC
  Administered 2018-12-27 – 2018-12-29 (×3): 81 mg via ORAL
  Filled 2018-12-26 (×3): qty 1

## 2018-12-26 MED ORDER — BISACODYL 10 MG RE SUPP: 10.0000 mg | Freq: Every day | RECTAL | Status: DC | PRN

## 2018-12-26 MED ORDER — FENTANYL CITRATE (PF) 100 MCG/2ML IJ SOLN
INTRAMUSCULAR | Status: DC | PRN
  Administered 2018-12-26: 50 ug via INTRAVENOUS

## 2018-12-26 MED ORDER — LACTATED RINGERS IV SOLN
INTRAVENOUS | Status: DC
  Administered 2018-12-26 (×2): via INTRAVENOUS

## 2018-12-26 MED ORDER — INFLUENZA VAC A&B SA ADJ QUAD 0.5 ML IM PRSY
0.5000 mL | PREFILLED_SYRINGE | INTRAMUSCULAR | Status: AC
  Administered 2018-12-27: 13:00:00 0.5 mL via INTRAMUSCULAR
  Filled 2018-12-26: qty 0.5

## 2018-12-26 MED ORDER — ACETAMINOPHEN 500 MG PO TABS: 1000.0000 mg | ORAL_TABLET | Freq: Four times a day (QID) | ORAL | Status: AC

## 2018-12-26 MED ORDER — PROPOFOL 10 MG/ML IV BOLUS
INTRAVENOUS | Status: DC | PRN
  Administered 2018-12-26: 20 mg via INTRAVENOUS

## 2018-12-26 MED ORDER — BUPIVACAINE-EPINEPHRINE (PF) 0.5% -1:200000 IJ SOLN
INTRAMUSCULAR | Status: DC | PRN
  Administered 2018-12-26: 30 mL via PERINEURAL

## 2018-12-26 MED ORDER — METOCLOPRAMIDE HCL 10 MG PO TABS
5.0000 mg | ORAL_TABLET | Freq: Three times a day (TID) | ORAL | Status: DC | PRN
  Administered 2018-12-27: 14:00:00 5 mg via ORAL
  Filled 2018-12-26: qty 1

## 2018-12-26 MED ORDER — FLEET ENEMA 7-19 GM/118ML RE ENEM: 1.0000 | ENEMA | Freq: Once | RECTAL | Status: DC | PRN

## 2018-12-26 MED ORDER — ONDANSETRON HCL 4 MG/2ML IJ SOLN
4.0000 mg | Freq: Four times a day (QID) | INTRAMUSCULAR | Status: DC | PRN
  Administered 2018-12-28: 07:00:00 4 mg via INTRAVENOUS
  Filled 2018-12-26: qty 2

## 2018-12-26 MED ORDER — ONDANSETRON HCL 4 MG PO TABS
4.0000 mg | ORAL_TABLET | Freq: Four times a day (QID) | ORAL | Status: DC | PRN
  Administered 2018-12-27 – 2018-12-29 (×2): 4 mg via ORAL
  Filled 2018-12-26 (×2): qty 1

## 2018-12-26 MED ORDER — FENTANYL CITRATE (PF) 100 MCG/2ML IJ SOLN
INTRAMUSCULAR | Status: AC
  Filled 2018-12-26: qty 2

## 2018-12-26 MED ORDER — METOCLOPRAMIDE HCL 5 MG/ML IJ SOLN: 5.0000 mg | Freq: Three times a day (TID) | INTRAMUSCULAR | Status: DC | PRN

## 2018-12-26 MED ORDER — PROPOFOL 500 MG/50ML IV EMUL
INTRAVENOUS | Status: DC | PRN
  Administered 2018-12-26: 30 ug/kg/min via INTRAVENOUS

## 2018-12-26 MED ORDER — KETAMINE HCL 50 MG/ML IJ SOLN
INTRAMUSCULAR | Status: DC | PRN
  Administered 2018-12-26: 25 mg via INTRAMUSCULAR

## 2018-12-26 MED ORDER — KETAMINE HCL 50 MG/ML IJ SOLN
INTRAMUSCULAR | Status: AC
  Filled 2018-12-26: qty 10

## 2018-12-26 MED ORDER — HYDROMORPHONE HCL 1 MG/ML IJ SOLN: 0.2500 mg | INTRAMUSCULAR | Status: DC | PRN

## 2018-12-26 MED ORDER — CEFAZOLIN SODIUM-DEXTROSE 2-4 GM/100ML-% IV SOLN
2.0000 g | Freq: Four times a day (QID) | INTRAVENOUS | Status: AC
  Administered 2018-12-27 (×3): 2 g via INTRAVENOUS
  Filled 2018-12-26 (×3): qty 100

## 2018-12-26 MED ORDER — SENNOSIDES-DOCUSATE SODIUM 8.6-50 MG PO TABS: 1.0000 | ORAL_TABLET | Freq: Every evening | ORAL | Status: DC | PRN

## 2018-12-26 MED ORDER — MIDAZOLAM HCL 5 MG/5ML IJ SOLN
INTRAMUSCULAR | Status: DC | PRN
  Administered 2018-12-26: 1 mg via INTRAVENOUS

## 2018-12-26 MED ORDER — SODIUM CHLORIDE 0.9 % IV SOLN
INTRAVENOUS | Status: DC
  Administered 2018-12-26: 22:00:00 via INTRAVENOUS

## 2018-12-26 MED ORDER — LEVOTHYROXINE SODIUM 100 MCG PO TABS
100.0000 ug | ORAL_TABLET | Freq: Every day | ORAL | Status: DC
  Administered 2018-12-27 – 2018-12-29 (×3): 100 ug via ORAL
  Filled 2018-12-26 (×3): qty 1

## 2018-12-26 MED ORDER — MIDAZOLAM HCL 2 MG/2ML IJ SOLN
INTRAMUSCULAR | Status: AC
  Filled 2018-12-26: qty 2

## 2018-12-26 MED ORDER — BISACODYL 5 MG PO TBEC: 5.0000 mg | DELAYED_RELEASE_TABLET | Freq: Every day | ORAL | Status: DC | PRN

## 2018-12-26 MED ORDER — ACETAMINOPHEN 325 MG PO TABS: 650.0000 mg | ORAL_TABLET | Freq: Four times a day (QID) | ORAL | Status: DC | PRN

## 2018-12-26 MED ORDER — ONDANSETRON HCL 4 MG/2ML IJ SOLN: 4.0000 mg | Freq: Once | INTRAMUSCULAR | Status: DC | PRN

## 2018-12-26 MED ORDER — MORPHINE SULFATE (PF) 4 MG/ML IV SOLN: 4.0000 mg | INTRAVENOUS | Status: DC | PRN

## 2018-12-26 MED ORDER — PROPOFOL 10 MG/ML IV BOLUS
INTRAVENOUS | Status: AC
  Filled 2018-12-26: qty 20

## 2018-12-26 MED ORDER — MAGNESIUM HYDROXIDE 400 MG/5ML PO SUSP: 30.0000 mL | Freq: Every day | ORAL | Status: DC | PRN

## 2018-12-26 MED ORDER — POTASSIUM CHLORIDE CRYS ER 20 MEQ PO TBCR
20.0000 meq | EXTENDED_RELEASE_TABLET | Freq: Two times a day (BID) | ORAL | Status: DC
  Administered 2018-12-27 – 2018-12-29 (×3): 20 meq via ORAL
  Filled 2018-12-26 (×5): qty 1

## 2018-12-26 MED ORDER — DOCUSATE SODIUM 100 MG PO CAPS
100.0000 mg | ORAL_CAPSULE | Freq: Two times a day (BID) | ORAL | Status: DC
  Administered 2018-12-27 – 2018-12-29 (×5): 100 mg via ORAL
  Filled 2018-12-26 (×5): qty 1

## 2018-12-26 MED ORDER — TRAMADOL HCL 50 MG PO TABS
50.0000 mg | ORAL_TABLET | Freq: Four times a day (QID) | ORAL | Status: DC | PRN
  Administered 2018-12-27 – 2018-12-29 (×4): 50 mg via ORAL
  Filled 2018-12-26 (×4): qty 1

## 2018-12-26 MED ORDER — ACETAMINOPHEN 325 MG PO TABS: 325.0000 mg | ORAL_TABLET | Freq: Four times a day (QID) | ORAL | Status: DC | PRN

## 2018-12-26 MED ORDER — ALBUTEROL SULFATE (2.5 MG/3ML) 0.083% IN NEBU: 2.5000 mg | INHALATION_SOLUTION | RESPIRATORY_TRACT | Status: DC | PRN

## 2018-12-26 MED ORDER — TORSEMIDE 20 MG PO TABS
20.0000 mg | ORAL_TABLET | Freq: Every day | ORAL | Status: DC
  Filled 2018-12-26 (×3): qty 1

## 2018-12-26 MED ORDER — ENOXAPARIN SODIUM 40 MG/0.4ML ~~LOC~~ SOLN
40.0000 mg | SUBCUTANEOUS | Status: DC
  Administered 2018-12-27 – 2018-12-29 (×3): 40 mg via SUBCUTANEOUS
  Filled 2018-12-26 (×3): qty 0.4

## 2018-12-26 MED ORDER — PRAVASTATIN SODIUM 20 MG PO TABS
80.0000 mg | ORAL_TABLET | Freq: Every day | ORAL | Status: DC
  Administered 2018-12-27 – 2018-12-29 (×3): 80 mg via ORAL
  Filled 2018-12-26 (×4): qty 4

## 2018-12-26 MED ORDER — CEFAZOLIN SODIUM-DEXTROSE 2-4 GM/100ML-% IV SOLN
2.0000 g | Freq: Once | INTRAVENOUS | Status: AC
  Administered 2018-12-26: 19:00:00 2 g via INTRAVENOUS
  Filled 2018-12-26: qty 100

## 2018-12-26 MED ORDER — FENTANYL CITRATE (PF) 100 MCG/2ML IJ SOLN: 25.0000 ug | INTRAMUSCULAR | Status: DC | PRN

## 2018-12-26 MED ORDER — DIPHENHYDRAMINE HCL 12.5 MG/5ML PO ELIX: 12.5000 mg | ORAL_SOLUTION | ORAL | Status: DC | PRN

## 2018-12-26 MED ORDER — FENTANYL CITRATE (PF) 100 MCG/2ML IJ SOLN
50.0000 ug | Freq: Once | INTRAMUSCULAR | Status: AC
  Administered 2018-12-26: 14:00:00 50 ug via INTRAVENOUS
  Filled 2018-12-26: qty 2

## 2018-12-26 MED ORDER — OXYCODONE HCL 5 MG PO TABS: 5.0000 mg | ORAL_TABLET | ORAL | Status: DC | PRN

## 2018-12-26 MED ORDER — SODIUM CHLORIDE 0.9 % IV SOLN
INTRAVENOUS | Status: DC | PRN
  Administered 2018-12-26: 19:00:00 20 ug/min via INTRAVENOUS

## 2018-12-26 MED ORDER — ACETAMINOPHEN 10 MG/ML IV SOLN
INTRAVENOUS | Status: AC
  Filled 2018-12-26: qty 100

## 2018-12-26 MED ORDER — ACETAMINOPHEN 650 MG RE SUPP: 650.0000 mg | Freq: Four times a day (QID) | RECTAL | Status: DC | PRN

## 2018-12-26 MED ORDER — ONDANSETRON HCL 4 MG/2ML IJ SOLN: 4.0000 mg | Freq: Four times a day (QID) | INTRAMUSCULAR | Status: DC | PRN

## 2018-12-26 MED ORDER — ACETAMINOPHEN 10 MG/ML IV SOLN
INTRAVENOUS | Status: DC | PRN
  Administered 2018-12-26: 1000 mg via INTRAVENOUS

## 2018-12-26 MED ORDER — SODIUM CHLORIDE 0.9 % IV SOLN: INTRAVENOUS | Status: DC

## 2018-12-26 MED ORDER — DILTIAZEM HCL ER COATED BEADS 240 MG PO CP24
240.0000 mg | ORAL_CAPSULE | Freq: Every day | ORAL | Status: DC
  Administered 2018-12-27 – 2018-12-29 (×3): 240 mg via ORAL
  Filled 2018-12-26 (×3): qty 1

## 2018-12-26 MED ORDER — FENTANYL CITRATE (PF) 100 MCG/2ML IJ SOLN
50.0000 ug | Freq: Once | INTRAMUSCULAR | Status: AC
  Administered 2018-12-26: 50 ug via INTRAVENOUS

## 2018-12-26 SURGICAL SUPPLY — 43 items
BIT DRILL 4.3MMS DISTAL GRDTED (BIT) ×1 IMPLANT
BNDG COHESIVE 4X5 TAN STRL (GAUZE/BANDAGES/DRESSINGS) ×3 IMPLANT
BNDG COHESIVE 6X5 TAN STRL LF (GAUZE/BANDAGES/DRESSINGS) ×3 IMPLANT
CANISTER SUCT 1200ML W/VALVE (MISCELLANEOUS) ×3 IMPLANT
CHLORAPREP W/TINT 26 (MISCELLANEOUS) ×6 IMPLANT
COVER WAND RF STERILE (DRAPES) ×3 IMPLANT
DRAPE 3/4 80X56 (DRAPES) ×3 IMPLANT
DRAPE C-ARMOR (DRAPES) ×3 IMPLANT
DRILL 4.3MMS DISTAL GRADUATED (BIT) ×3
DRSG OPSITE POSTOP 3X4 (GAUZE/BANDAGES/DRESSINGS) ×12 IMPLANT
DRSG OPSITE POSTOP 4X6 (GAUZE/BANDAGES/DRESSINGS) ×6 IMPLANT
ELECT CAUTERY BLADE 6.4 (BLADE) ×3 IMPLANT
ELECT REM PT RETURN 9FT ADLT (ELECTROSURGICAL) ×3
ELECTRODE REM PT RTRN 9FT ADLT (ELECTROSURGICAL) ×1 IMPLANT
GAUZE SPONGE 4X4 12PLY STRL (GAUZE/BANDAGES/DRESSINGS) IMPLANT
GLOVE BIO SURGEON STRL SZ8 (GLOVE) ×9 IMPLANT
GLOVE INDICATOR 8.0 STRL GRN (GLOVE) ×9 IMPLANT
GOWN STRL REUS W/ TWL LRG LVL3 (GOWN DISPOSABLE) ×2 IMPLANT
GOWN STRL REUS W/ TWL XL LVL3 (GOWN DISPOSABLE) ×1 IMPLANT
GOWN STRL REUS W/TWL LRG LVL3 (GOWN DISPOSABLE) ×4
GOWN STRL REUS W/TWL XL LVL3 (GOWN DISPOSABLE) ×2
GUIDEPIN VERSANAIL DSP 3.2X444 (ORTHOPEDIC DISPOSABLE SUPPLIES) ×3 IMPLANT
GUIDEWIRE BALL NOSE 80CM (WIRE) ×3 IMPLANT
HFN RH 130 DEG 9MM X 360MM (Nail) ×3 IMPLANT
HIP FRA NAIL LAG SCREW 10.5X90 (Orthopedic Implant) ×3 IMPLANT
MAT ABSORB  FLUID 56X50 GRAY (MISCELLANEOUS) ×2
MAT ABSORB FLUID 56X50 GRAY (MISCELLANEOUS) ×1 IMPLANT
NEEDLE FILTER BLUNT 18X 1/2SAF (NEEDLE) ×2
NEEDLE FILTER BLUNT 18X1 1/2 (NEEDLE) ×1 IMPLANT
NEEDLE HYPO 22GX1.5 SAFETY (NEEDLE) ×3 IMPLANT
NS IRRIG 500ML POUR BTL (IV SOLUTION) ×3 IMPLANT
PACK HIP COMPR (MISCELLANEOUS) ×3 IMPLANT
SCREW BONE CORTICAL 5.0X40 (Screw) ×3 IMPLANT
SCREW LAG HIP FRA NAIL 10.5X90 (Orthopedic Implant) ×1 IMPLANT
SLEEVE SCD COMPRESS THIGH MED (MISCELLANEOUS) ×3 IMPLANT
STAPLER SKIN PROX 35W (STAPLE) ×3 IMPLANT
STRAP SAFETY 5IN WIDE (MISCELLANEOUS) ×3 IMPLANT
SUT VIC AB 0 CT1 36 (SUTURE) ×3 IMPLANT
SUT VIC AB 1 CT1 36 (SUTURE) ×3 IMPLANT
SUT VIC AB 2-0 CT1 (SUTURE) ×6 IMPLANT
SYR 10ML LL (SYRINGE) ×3 IMPLANT
SYR 30ML LL (SYRINGE) ×3 IMPLANT
TAPE MICROFOAM 4IN (TAPE) ×3 IMPLANT

## 2018-12-26 NOTE — Anesthesia Preprocedure Evaluation (Signed)
Anesthesia Evaluation  Patient identified by MRN, date of birth, ID band Patient awake    Reviewed: Allergy & Precautions, NPO status , Patient's Chart, lab work & pertinent test results  History of Anesthesia Complications Negative for: history of anesthetic complications  Airway Mallampati: III       Dental   Pulmonary neg sleep apnea, neg COPD, Not current smoker, former smoker,           Cardiovascular (-) hypertension(-) Past MI + dysrhythmias (tachycardia) (-) Valvular Problems/Murmurs     Neuro/Psych neg Seizures    GI/Hepatic Neg liver ROS, GERD  Medicated and Controlled,  Endo/Other  Hypothyroidism   Renal/GU negative Renal ROS     Musculoskeletal   Abdominal   Peds  Hematology  (+) anemia ,   Anesthesia Other Findings   Reproductive/Obstetrics                             Anesthesia Physical Anesthesia Plan  ASA: II and emergent  Anesthesia Plan: Spinal   Post-op Pain Management:    Induction:   PONV Risk Score and Plan:   Airway Management Planned:   Additional Equipment:   Intra-op Plan:   Post-operative Plan:   Informed Consent: I have reviewed the patients History and Physical, chart, labs and discussed the procedure including the risks, benefits and alternatives for the proposed anesthesia with the patient or authorized representative who has indicated his/her understanding and acceptance.       Plan Discussed with:   Anesthesia Plan Comments:         Anesthesia Quick Evaluation

## 2018-12-26 NOTE — ED Notes (Signed)
Consent form signed and sent with OR staff.

## 2018-12-26 NOTE — ED Notes (Signed)
[place] on pt by Probation officer and EDT Mel.

## 2018-12-26 NOTE — Op Note (Signed)
12/26/2018  8:01 PM  Patient:   Kelly Wood  Pre-Op Diagnosis:   Closed displaced intertrochanteric fracture, right hip.  Post-Op Diagnosis:   Same  Procedure:   Reduction and internal fixation of right hip fracture with Biomet Affixis TFN nail.  Surgeon:   Pascal Lux, MD  Assistant:   None  Anesthesia:   Spinal  Findings:   As above  Complications:   None  EBL:   50 cc  Fluids:   600 cc crystalloid  UOP:   300 cc  TT:   None  Drains:   None  Closure:   Staples  Implants:   Biomet Affixis 9 x 360 mm TFN with a 90 mm lag screw and a 40 mm distal interlocking screw  Brief Clinical Note:   The patient is a 78 year old female who sustained the above-noted injury earlier today when she tripped descending several steps into her garage and landed on her right hip. She was brought to the emergency room where x-rays demonstrated the above-noted injury. The patient has been cleared medically and presents at this time for reduction and internal fixation of the right hip fracture.  Procedure:   The patient was brought into the operating room. After adequate spinal anesthesia was obtained, the patient was lain in the supine position on the fracture table. The uninjured leg was placed in a flexed and abducted position while the injured lower extremity was placed in longitudinal traction. The fracture was reduced using longitudinal traction and internal rotation. The adequacy of reduction was verified fluoroscopically in AP and lateral projections and found to be near anatomic. The lateral aspects of the right hip and thigh were prepped with ChloraPrep solution before being draped sterilely. Preoperative antibiotics were administered. A timeout was performed to verify the appropriate surgical site. The greater trochanter was identified fluoroscopically and an approximately 3 cm incision made about 2-3 fingerbreadths above the tip of the greater trochanter. The incision was carried  down through the subcutaneous tissues to expose the gluteal fascia. This was split the length of the incision, providing access to the tip of the trochanter. Under fluoroscopic guidance, a guidewire was drilled through the tip of the trochanter into the proximal metaphysis to the level of the lesser trochanter. After verifying its position fluoroscopically in AP and lateral projections, it was overreamed with the initial reamer to the depth of the lesser trochanter. A guidewire was passed down through the femoral canal to the supracondylar region. The adequacy of guidewire position was verified fluoroscopically in AP and lateral projections before the length of the guidewire within the canal was measured and found to be 365 mm. Therefore, a 360 mm length nail was selected. The guidewire was overreamed sequentially using the flexible reamers, beginning with a 9.5 mm reamer and progressing to a 10.5 mm reamer. This provided good cortical chatter. The 9 x 360 mm Biomet Affixis TFN rod was selected and advanced to the appropriate depth, as verified fluoroscopically.   The guide system for the lag screw was positioned and advanced through an approximately 2 cm stab incision over the lateral aspect of the proximal femur. The guidewire was drilled up through the trochanteric femoral nail and into the femoral neck to rest within 5 mm of subchondral bone. After verifying its position in the femoral neck and head in both AP and lateral projections, the guidewire was measured and found to be optimally replicated by a 90 mm lag screw. The guidewire was overreamed to the  appropriate depth before the lag screw was inserted and advanced to the appropriate depth as verified fluoroscopically in AP and lateral projections. The locking screw was advanced, then backed off a quarter turn to set the lag screw. Again the adequacy of hardware position and fracture reduction was verified fluoroscopically in AP and lateral projections and  found to be excellent.  Attention was directed distally. Using the "perfect circle" technique, the leg and fluoroscopy machine were positioned appropriately. An approximately 1.5 cm stab incision was made over the skin at the appropriate point before the drill bit was advanced through the cortex and across the static hole of the nail. The appropriate length of the screw was determined before the 40 mm distal interlocking screw was positioned, then advanced and tightened securely. Again the adequacy of screw position was verified fluoroscopically in AP and lateral projections and found to be excellent.  The wounds were irrigated thoroughly with sterile saline solution before the abductor fascia was reapproximated using #1 Vicryl interrupted sutures. The subcutaneous tissues were closed using 2-0 Vicryl interrupted sutures. The skin was closed using staples. A total of 30 cc of 0.5% Sensorcaine with epinephrine was injected in and around all incisions. Sterile occlusive dressings were applied to all wounds before the patient was transferred back to his/her hospital bed. The patient was then transferred to the recovery room in satisfactory condition after tolerating the procedure well.

## 2018-12-26 NOTE — Transfer of Care (Signed)
Immediate Anesthesia Transfer of Care Note  Patient: Danylah Forrister Hollen  Procedure(s) Performed: INTRAMEDULLARY (IM) NAIL INTERTROCHANTRIC (Right )  Patient Location: PACU  Anesthesia Type:Spinal  Level of Consciousness: awake, alert , oriented and patient cooperative  Airway & Oxygen Therapy: Patient Spontanous Breathing  Post-op Assessment: Report given to RN and Post -op Vital signs reviewed and stable  Post vital signs: Reviewed and stable  Last Vitals:  Vitals Value Taken Time  BP 114/72 12/26/18 2005  Temp 36.4 C 12/26/18 2005  Pulse 78 12/26/18 2007  Resp 19 12/26/18 2007  SpO2 100 % 12/26/18 2007  Vitals shown include unvalidated device data.  Last Pain:  Vitals:   12/26/18 1713  TempSrc: Oral  PainSc:       Patients Stated Pain Goal: 3 ([id number])  Complications: No apparent anesthesia complications

## 2018-12-26 NOTE — H&P (Addendum)
[place] NAME: Kelly Wood    MR#:  [id number]  DATE OF BIRTH:  [birth year]  DATE OF ADMISSION:  12/26/2018  PRIMARY CARE PHYSICIAN: Idelle Crouch, MD   REQUESTING/REFERRING PHYSICIAN: Dr. Krishika Mayans  CHIEF COMPLAINT:   Chief Complaint  Patient presents with  . Hip Pain  . Fall   Fall today. HISTORY OF PRESENT ILLNESS:  Kelly Wood  is a 78 y.o. female with a known history of anemia, hypothyroidism, GERD and hyperlipidemia.  The patient presents the ED with above chief complaints.  She fell by accident today and complains of right hip pain.  She cannot walk after fall.  She denies any loss of consciousness or syncope.  X-ray showed right hip fracture.  Dr. Roland Rack will do surgery today. PAST MEDICAL HISTORY:   Past Medical History:  Diagnosis Date  . Anemia   . GERD (gastroesophageal reflux disease)   . Hyperlipidemia   . Hypothyroidism     PAST SURGICAL HISTORY:   Past Surgical History:  Procedure Laterality Date  . COLONOSCOPY N/A 06/23/2016   Procedure: COLONOSCOPY;  Surgeon: Manya Silvas, MD;  [facility] ENDOSCOPY;  Service: Endoscopy;  Laterality: N/A;  . TUBAL LIGATION      SOCIAL HISTORY:   Social History   Tobacco Use  . Smoking status: Former Smoker    Quit date: 10/24/2006    Years since quitting: 12.1  . Smokeless tobacco: Never Used  Substance Use Topics  . Alcohol use: Yes    Comment: rarely    FAMILY HISTORY:   Family History  Problem Relation Age of Onset  . Breast cancer Neg Hx     DRUG ALLERGIES:   Allergies  Allergen Reactions  . Azithromycin Diarrhea  . Levofloxacin Other (See Comments)    Pt prefers not to take because og side effects  . Sulfa Antibiotics     REVIEW OF SYSTEMS:   Review of Systems  Constitutional: Negative for chills, fever and malaise/fatigue.  HENT: Negative for sore throat.   Eyes: Negative for blurred vision and double vision.  Respiratory:  Negative for cough, hemoptysis, shortness of breath, wheezing and stridor.   Cardiovascular: Negative for chest pain, palpitations, orthopnea and leg swelling.  Gastrointestinal: Negative for abdominal pain, blood in stool, diarrhea, melena, nausea and vomiting.  Genitourinary: Negative for dysuria, flank pain and hematuria.  Musculoskeletal: Positive for joint pain. Negative for back pain.  Skin: Negative for rash.  Neurological: Negative for dizziness, sensory change, focal weakness, seizures, loss of consciousness, weakness and headaches.  Endo/Heme/Allergies: Negative for polydipsia.  Psychiatric/Behavioral: Negative for depression. The patient is not nervous/anxious.     MEDICATIONS AT HOME:   Prior to Admission medications   Medication Sig Start Date End Date Taking? Authorizing Provider  alendronate (FOSAMAX) 70 MG tablet Take 70 mg by mouth once a week. 11/03/18  Yes [provider]  aspirin EC 81 MG tablet Take 81 mg by mouth daily.   Yes [provider]  diltiazem (CARDIZEM CD) 240 MG 24 hr capsule Take 240 mg by mouth daily.   Yes [provider]  levothyroxine (SYNTHROID) 100 MCG tablet Take 100 mcg by mouth daily. Take on an empty stomach with a glass of water at least 30-60 minutes before breakfast 10/23/18 10/23/19 Yes [provider]  potassium chloride SA (K-DUR,KLOR-CON) 20 MEQ tablet Take 20 mEq by mouth 2 (two) times daily.   Yes [provider]  pravastatin (PRAVACHOL) 80 MG tablet Take 80 mg by mouth daily.   Yes [provider]  torsemide (DEMADEX) 20 MG tablet Take 20 mg by mouth daily.   Yes [provider]  fluticasone (FLONASE) 50 MCG/ACT nasal spray Place 2 sprays into both nostrils daily. 07/08/17   Melynda Ripple, MD      VITAL SIGNS:  Blood pressure (!) 166/91, pulse 74, temperature 98.4 F (36.9 C), temperature source Oral, resp. rate 14, height 5\' 2"  (1.575 m), weight 63.5 kg, SpO2 97 %.   PHYSICAL EXAMINATION:  Physical Exam  GENERAL:  78 y.o.-year-old patient lying in the bed with no acute distress.  EYES: Pupils equal, round, reactive to light and accommodation. No scleral icterus. Extraocular muscles intact.  HEENT: Head atraumatic, normocephalic. Oropharynx and nasopharynx clear.  NECK:  Supple, no jugular venous distention. No thyroid enlargement, no tenderness.  LUNGS: Normal breath sounds bilaterally, no wheezing, rales,rhonchi or crepitation. No use of accessory muscles of respiration.  CARDIOVASCULAR: S1, S2 normal. No murmurs, rubs, or gallops.  ABDOMEN: Soft, nontender, nondistended. Bowel sounds present. No organomegaly or mass.  EXTREMITIES: No pedal edema, cyanosis, or clubbing.  Right leg deformity. NEUROLOGIC: Cranial nerves II through XII are intact. Muscle strength 5/5 in all extremities. Sensation intact. Gait not checked.  PSYCHIATRIC: The patient is alert and oriented x 3.  SKIN: No obvious rash, lesion, or ulcer.   LABORATORY PANEL:   CBC Recent Labs  Lab 12/26/18 1233  WBC 8.2  HGB 12.8  HCT 40.4  PLT 205   ------------------------------------------------------------------------------------------------------------------  Chemistries  Recent Labs  Lab 12/26/18 1233  NA 139  K 3.8  CL 105  CO2 23  GLUCOSE 112*  BUN 17  CREATININE 0.62  CALCIUM 9.3   ------------------------------------------------------------------------------------------------------------------  Cardiac Enzymes No results for input(s): TROPONINI in the last 168 hours. ------------------------------------------------------------------------------------------------------------------  RADIOLOGY:  Dg Chest 1 View  Result Date: 12/26/2018 CLINICAL DATA:  Pain EXAM: CHEST  1 VIEW COMPARISON:  None. FINDINGS: The heart size is enlarged. Aortic calcifications are noted. There is no pneumothorax. No large pleural effusion. No focal infiltrate. IMPRESSION: No active  disease. Electronically Signed   By: Constance Holster M.D.   On: 12/26/2018 13:33   Dg Hip Unilat  With Pelvis 2-3 Views Right  Result Date: 12/26/2018 CLINICAL DATA:  Right hip pain after fall from standing today. Initial encounter. EXAM: DG HIP (WITH OR WITHOUT PELVIS) 2-3V RIGHT COMPARISON:  None. FINDINGS: The patient has an acute right intertrochanteric fracture. No other acute abnormality is identified. IMPRESSION: Acute right intertrochanteric fracture. Electronically Signed   By: Inge Rise M.D.   On: 12/26/2018 13:34      IMPRESSION AND PLAN:   Right hip fracture. The patient will be admitted to the medical floor. Right hip surgery today per Dr. Darin Engels.  SCD for now.  PT and DVT prophylaxis after surgery. Pain control and IV fluid support. Hypertension.  Continue Cardizem and Lasix. Hypothyroidism and hyperlipidemia.  Continue home medication.  All the records are reviewed and case discussed with ED provider. Management plans discussed with the patient, her husband and they are in agreement.  CODE STATUS: Full code TOTAL TIME TAKING CARE OF THIS PATIENT: 52 minutes.    Demetrios Loll M.D on 12/26/2018 at 3:17 PM  Between 7am to 6pm - Pager - [phone]  After 6pm go to [email or web address] - [employer] [facility]  Office  [phone]  CC: Primary care physician; Idelle Crouch,  MD   Note: This dictation was prepared with Dragon dictation along with smaller phrase technology. Any transcriptional errors that result from this process are unin

## 2018-12-26 NOTE — OR Nursing (Signed)
Report to [facility] on ortho floor.

## 2018-12-26 NOTE — Anesthesia Post-op Follow-up Note (Signed)
Anesthesia QCDR form completed.        

## 2018-12-26 NOTE — ED Triage Notes (Signed)
Pt in via EMS from home d/t fall resulting in R hip/knee pain. Pt A&Ox4. Denies hitting head. States fell d/t missing step at the house. Denies dizziness. History of HTN, inc chol, thyroid issues. VSS with EMS.

## 2018-12-26 NOTE — ED Provider Notes (Signed)
[facility] Emergency Department Provider Note  ____________________________________________   First MD Initiated Contact with Patient 12/26/18 1234     (approximate)  I have reviewed the triage vital signs and the nursing notes.  History  Chief Complaint Hip Pain and Fall    HPI Kelly Wood is a 78 y.o. female history of hyperlipidemia, hypothyroidism, who presents emergency department for mechanical fall with resultant right hip pain. Pain is moderate in severity, worse with movement. Patient states she missed a step walking from her house into her garage, causing her to fall, landing on her hip.  She denies any preceding prodromal symptoms including lightheadedness, dizziness, chest pain, or shortness of breath.  Unable to walk since the fall.  She denies any head injury, loss of consciousness.  She is not on any blood thinning medications. Last ate at 10:00 AM.    Past Medical Hx Past Medical History:  Diagnosis Date  . Anemia   . GERD (gastroesophageal reflux disease)   . Hyperlipidemia   . Hypothyroidism     Problem List There are no active problems to display for this patient.   Past Surgical Hx Past Surgical History:  Procedure Laterality Date  . COLONOSCOPY N/A 06/23/2016   Procedure: COLONOSCOPY;  Surgeon: Manya Silvas, MD;  Location: [facility] ENDOSCOPY;  Service: Endoscopy;  Laterality: N/A;  . TUBAL LIGATION      Medications Prior to Admission medications   Medication Sig Start Date End Date Taking? Authorizing Provider  diltiazem (CARDIZEM CD) 240 MG 24 hr capsule Take 240 mg by mouth daily.    [provider]  fluticasone (FLONASE) 50 MCG/ACT nasal spray Place 2 sprays into both nostrils daily. 07/08/17   Melynda Ripple, MD  levothyroxine (SYNTHROID, LEVOTHROID) 75 MCG tablet Take 75 mcg by mouth daily before breakfast.    [provider]  potassium chloride SA (K-DUR,KLOR-CON) 20 MEQ tablet Take 20 mEq by  mouth 2 (two) times daily.    [provider]  pravastatin (PRAVACHOL) 80 MG tablet Take 80 mg by mouth daily.    [provider]  torsemide (DEMADEX) 20 MG tablet Take 20 mg by mouth daily.    [provider]    Allergies Sulfa antibiotics  Family Hx Family History  Problem Relation Age of Onset  . Breast cancer Neg Hx     Social Hx Social History   Tobacco Use  . Smoking status: Former Smoker    Quit date: 10/24/2006    Years since quitting: 12.1  . Smokeless tobacco: Never Used  Substance Use Topics  . Alcohol use: Yes    Comment: rarely  . Drug use: Never     Review of Systems  Constitutional: Negative for fever, chills. Eyes: Negative for visual changes. ENT: Negative for sore throat. Cardiovascular: Negative for chest pain. Respiratory: Negative for shortness of breath. Gastrointestinal: Negative for nausea, vomiting.  Genitourinary: Negative for dysuria. Musculoskeletal: + right hip pain Skin: Negative for rash. Neurological: Negative for for headaches.   Physical Exam  Vital Signs: ED Triage Vitals  Enc Vitals Group     BP 12/26/18 1225 (!) 169/98     Pulse Rate 12/26/18 1225 62     Resp 12/26/18 1225 14     Temp 12/26/18 1225 98.4 F (36.9 C)     Temp Source 12/26/18 1225 Oral     SpO2 12/26/18 1225 97 %     Weight 12/26/18 1227 140 lb (63.5 kg)  Height 12/26/18 1227 5\' 2"  (1.575 m)     Head Circumference --      Peak Flow --      Pain Score 12/26/18 1227 9     Pain Loc --      Pain Edu? --      Excl. in [place]? --     Constitutional: Alert and oriented.  Head: Normocephalic. Atraumatic. Eyes: Conjunctivae clear. Sclera anicteric. Nose: No congestion. No rhinorrhea. Mouth/Throat: Mucous membranes are moist.  Neck: No stridor.  No midline CS tenderness. Cardiovascular: Normal rate, regular rhythm. No murmurs. Extremities well perfused. 2+ symmetric DP pulses. Toes are WWP. Respiratory: Normal respiratory effort.   Lungs CTAB. Gastrointestinal: Soft. Non-tender. Non-distended.  Pelvis: TTP on R. ROM deferred 2/2 suspected injury. Musculoskeletal: RLE is shortened and externally rotated. Compartments are soft and compressible. Neurologic:  Normal speech and language. Skin: Skin is warm, dry and intact. No rash noted. Psychiatric: Mood and affect are appropriate for situation.  EKG  Personally reviewed.   Rate: 63 Rhythm: sinus Axis: normal Intervals: WNL No acute ischemic changes No STEMI    Radiology  XR hip: IMPRESSION: Acute right intertrochanteric fracture.   Procedures  Procedure(s) performed (including critical care):  Procedures   Initial Impression / Assessment and Plan / ED Course  78 y.o. female who presents to the ED for mechanical fall with resultant right hip pain, as above.  Imaging reveals a right intertrochanteric fracture. Basic blood work w/o actionable derangements. Updated patient on results. Discussed w/ orthopedics and hospitalist for admission.   Final Clinical Impression(s) / ED Diagnosis  Final diagnoses:  Fall, initial encounter  Closed fracture of right hip, initial encounter [facility])       Note:  This document was prepared using Dragon voice recognition software and may include unintentional dictation errors.   Lilia Pro., MD 12/26/18 [phone]

## 2018-12-26 NOTE — ED Notes (Signed)
Sam RN attempted for IV. Grn/lav collected.

## 2018-12-26 NOTE — Consult Note (Signed)
ORTHOPAEDIC CONSULTATION  REQUESTING PHYSICIAN: Demetrios Loll, MD  Chief Complaint:   Right hip pain.  History of Present Illness: Kelly Wood is a 78 y.o. female with a history of hypothyroidism, hyperlipidemia, gastroesophageal reflux disease, and anemia who lives independently with her husband was in her usual state of health this morning.  Apparently she was rushing into her garage down several steps when she missed the bottom step and fell forward, landing on her left hip.  She was brought to the emergency room where x-rays demonstrated a mildly displaced 2-part intertrochanteric fracture of the right hip.  The patient denies any associated injuries resulting from the fall.  She did not strike her head or lose consciousness.  She also denies any lightheadedness, dizziness, chest pain, shortness of breath, or other symptoms which may have precipitated her fall.  Past Medical History:  Diagnosis Date  . Anemia   . GERD (gastroesophageal reflux disease)   . Hyperlipidemia   . Hypothyroidism    Past Surgical History:  Procedure Laterality Date  . COLONOSCOPY N/A 06/23/2016   Procedure: COLONOSCOPY;  Surgeon: Manya Silvas, MD;  Location: [facility] ENDOSCOPY;  Service: Endoscopy;  Laterality: N/A;  . TUBAL LIGATION     Social History   Socioeconomic History  . Marital status: Married    Spouse name: Not on file  . Number of children: Not on file  . Years of education: Not on file  . Highest education level: Not on file  Occupational History  . Not on file  Social Needs  . Financial resource strain: Not on file  . Food insecurity    Worry: Not on file    Inability: Not on file  . Transportation needs    Medical: Not on file    Non-medical: Not on file  Tobacco Use  . Smoking status: Former Smoker    Quit date: 10/24/2006    Years since quitting: 12.1  . Smokeless tobacco: Never Used  Substance and Sexual  Activity  . Alcohol use: Yes    Comment: rarely  . Drug use: Never  . Sexual activity: Not on file  Lifestyle  . Physical activity    Days per week: Not on file    Minutes per session: Not on file  . Stress: Not on file  Relationships  . Social Herbalist on phone: Not on file    Gets together: Not on file    Attends religious service: Not on file    Active member of club or organization: Not on file    Attends meetings of clubs or organizations: Not on file    Relationship status: Not on file  Other Topics Concern  . Not on file  Social History Narrative  . Not on file   Family History  Problem Relation Age of Onset  . Breast cancer Neg Hx    Allergies  Allergen Reactions  . Azithromycin Diarrhea  . Levofloxacin Other (See Comments)    Pt prefers not to take because og side effects  . Sulfa Antibiotics    Prior to Admission medications   Medication Sig Start Date End Date Taking? Authorizing Provider  alendronate (FOSAMAX) 70 MG tablet Take 70 mg by mouth once a week. 11/03/18  Yes [provider]  aspirin EC 81 MG tablet Take 81 mg by mouth daily.   Yes [provider]  diltiazem (CARDIZEM CD) 240 MG 24 hr capsule Take 240 mg by mouth daily.   Yes  [provider]  levothyroxine (SYNTHROID) 100 MCG tablet Take 100 mcg by mouth daily. Take on an empty stomach with a glass of water at least 30-60 minutes before breakfast 10/23/18 10/23/19 Yes [provider]  potassium chloride SA (K-DUR,KLOR-CON) 20 MEQ tablet Take 20 mEq by mouth 2 (two) times daily.   Yes [provider]  pravastatin (PRAVACHOL) 80 MG tablet Take 80 mg by mouth daily.   Yes [provider]  torsemide (DEMADEX) 20 MG tablet Take 20 mg by mouth daily.   Yes [provider]  fluticasone (FLONASE) 50 MCG/ACT nasal spray Place 2 sprays into both nostrils daily. 07/08/17   Melynda Ripple, MD   Dg Chest 1 View  Result Date:  12/26/2018 CLINICAL DATA:  Pain EXAM: CHEST  1 VIEW COMPARISON:  None. FINDINGS: The heart size is enlarged. Aortic calcifications are noted. There is no pneumothorax. No large pleural effusion. No focal infiltrate. IMPRESSION: No active disease. Electronically Signed   By: Constance Holster M.D.   On: 12/26/2018 13:33   Dg Hip Unilat  With Pelvis 2-3 Views Right  Result Date: 12/26/2018 CLINICAL DATA:  Right hip pain after fall from standing today. Initial encounter. EXAM: DG HIP (WITH OR WITHOUT PELVIS) 2-3V RIGHT COMPARISON:  None. FINDINGS: The patient has an acute right intertrochanteric fracture. No other acute abnormality is identified. IMPRESSION: Acute right intertrochanteric fracture. Electronically Signed   By: Inge Rise M.D.   On: 12/26/2018 13:34    Positive ROS: All other systems have been reviewed and were otherwise negative with the exception of those mentioned in the HPI and as above.  Physical Exam: General:  Alert, no acute distress Psychiatric:  Patient is competent for consent with normal mood and affect   Cardiovascular:  No pedal edema Respiratory:  No wheezing, non-labored breathing GI:  Abdomen is soft and non-tender Skin:  No lesions in the area of chief complaint Neurologic:  Sensation intact distally Lymphatic:  No axillary or cervical lymphadenopathy  Orthopedic Exam:  Orthopedic examination is limited to the right hip and lower extremity.  The right lower extremity is somewhat shortened and externally rotated as compared to the left lower extremity.  Skin inspection around the right hip is unremarkable.  No swelling, erythema, ecchymosis, abrasions, or other skin abnormalities are identified.  She has mild discomfort to palpation over the lateral aspect of the right hip.  She has more severe pain with any attempted active or passive motion of the hip.  She is able to dorsiflex and plantarflex her toes and ankle.  Sensation is intact to light touch to all  distributions.  She has good capillary refill to her right foot.  X-rays:  X-rays of the pelvis and right hip are available for review and have been reviewed by myself.  These films demonstrate a mildly displaced 2 part intertrochanteric fracture of the right hip.  The hip joint itself appears to be well-maintained with no evidence for significant degenerative changes.  No lytic lesions or other acute bony abnormalities are identified.  Assessment: Displaced intertrochanteric fracture, right hip.  Plan: The treatment options have been discussed with the patient and her husband who is at the bedside, including both surgical and nonsurgical choices.  The patient would like to proceed with surgical intervention to include an intramedullary nailing of the displaced right intertrochanteric hip fracture.  This procedure has been discussed in detail with the patient, as have the potential risks (including bleeding, infection, nerve and/or blood vessel injury,  persistent or recurrent pain, stiffness, malunion or nonunion, need for further surgery, blood clots, strokes, heart attacks and/or arrhythmias, etc.) and benefits.  The patient states her understanding and wishes to proceed.  A formal written consent will be obtained by the nursing staff.  Thank you for asking me to participate in the care of this most pleasant yet unfortunate woman.  I will be happy to follow her with you.   Pascal Lux, MD  Beeper #:  [phone]  12/26/2018 5:38 PM

## 2018-12-27 ENCOUNTER — Encounter: Payer: Self-pay | Admitting: Surgery

## 2018-12-27 LAB — CBC WITH DIFFERENTIAL/PLATELET
Abs Immature Granulocytes: 0.03 10*3/uL (ref 0.00–0.07)
Basophils Absolute: 0 10*3/uL (ref 0.0–0.1)
Basophils Relative: 0 %
Eosinophils Absolute: 0 10*3/uL (ref 0.0–0.5)
Eosinophils Relative: 0 %
HCT: 29.2 % — ABNORMAL LOW (ref 36.0–46.0)
Hemoglobin: 9.3 g/dL — ABNORMAL LOW (ref 12.0–15.0)
Immature Granulocytes: 0 %
Lymphocytes Relative: 23 %
Lymphs Abs: 2.1 10*3/uL (ref 0.7–4.0)
MCH: 27.9 pg (ref 26.0–34.0)
MCHC: 31.8 g/dL (ref 30.0–36.0)
MCV: 87.7 fL (ref 80.0–100.0)
Monocytes Absolute: 0.8 10*3/uL (ref 0.1–1.0)
Monocytes Relative: 9 %
Neutro Abs: 6.2 10*3/uL (ref 1.7–7.7)
Neutrophils Relative %: 68 %
Platelets: 168 10*3/uL (ref 150–400)
RBC: 3.33 MIL/uL — ABNORMAL LOW (ref 3.87–5.11)
RDW: 13.1 % (ref 11.5–15.5)
WBC: 9.3 10*3/uL (ref 4.0–10.5)
nRBC: 0 % (ref 0.0–0.2)

## 2018-12-27 LAB — URINALYSIS, COMPLETE (UACMP) WITH MICROSCOPIC
Bacteria, UA: NONE SEEN
Bilirubin Urine: NEGATIVE
Glucose, UA: NEGATIVE mg/dL
Ketones, ur: NEGATIVE mg/dL
Nitrite: NEGATIVE
Protein, ur: NEGATIVE mg/dL
Specific Gravity, Urine: 1.017 (ref 1.005–1.030)
Squamous Epithelial / LPF: NONE SEEN (ref 0–5)
WBC, UA: >50 WBC/hpf — ABNORMAL HIGH (ref 0–5)
pH: 6 (ref 5.0–8.0)

## 2018-12-27 LAB — BASIC METABOLIC PANEL
Anion gap: 9 (ref 5–15)
BUN: 13 mg/dL (ref 8–23)
CO2: 24 mmol/L (ref 22–32)
Calcium: 7.8 mg/dL — ABNORMAL LOW (ref 8.9–10.3)
Chloride: 105 mmol/L (ref 98–111)
Creatinine, Ser: 0.59 mg/dL (ref 0.44–1.00)
GFR calc Af Amer: >60 mL/min (ref >60)
GFR calc non Af Amer: >60 mL/min (ref >60)
Glucose, Bld: 115 mg/dL — ABNORMAL HIGH (ref 70–99)
Potassium: 3.4 mmol/L — ABNORMAL LOW (ref 3.5–5.1)
Sodium: 138 mmol/L (ref 135–145)

## 2018-12-27 NOTE — Progress Notes (Signed)
Physical Therapy Treatment Patient Details Name: Kelly Wood MRN: [id number] DOB: 1940-06-25 Today's Date: 12/27/2018    History of Present Illness presented to ER secondary to mechanical fall with acute onset of R hip pain; admitted for management of displaced R introtrochanteric hip fracture, s/p ORIF with nailing (9/22), WBAT.    PT Comments    Pt agreeable to PT; however, pt feeling very nauseated and reports recent emesis. Pt states she is trying not to take anti nausea medication if she can manage. Pt visibly looking ill and using a cold towel. Pt primarily wishes return to bed. Requires Mod A to stand and to control descent to bed as well as sit to supine bed mobility. Pt educated on a few basic bed level exercises and encouraged to perform in sets of 10 when pt feeling better. Pt wishing anti nausea medication at this time and nursing notified. Continue PT to progress strength, endurance to improve all functional mobility.   Follow Up Recommendations  SNF     Equipment Recommendations  Rolling walker with 5" wheels;3in1 (PT)(received)    Recommendations for Other Services       Precautions / Restrictions Precautions Precautions: Fall Restrictions Weight Bearing Restrictions: Yes RLE Weight Bearing: Weight bearing as tolerated    Mobility  Bed Mobility Overal bed mobility: Needs Assistance Bed Mobility: Sit to Supine     Supine to sit: Mod assist Sit to supine: Mod assist   General bed mobility comments: for LEs  Transfers Overall transfer level: Needs assistance Equipment used: Rolling walker (2 wheeled) Transfers: Sit to/from Stand Sit to Stand: Mod assist         General transfer comment: From recliner and to control descent to bed  Ambulation/Gait Ambulation/Gait assistance: Min guard Gait Distance (Feet): 3 Feet Assistive device: Rolling walker (2 wheeled) Gait Pattern/deviations: Step-to pattern     General Gait Details: chair to  bed   Stairs             Wheelchair Mobility    Modified Rankin (Stroke Patients Only)       Balance Overall balance assessment: Needs assistance Sitting-balance support: Bilateral upper extremity supported;Feet supported Sitting balance-Leahy Scale: Good Sitting balance - Comments: rigid/guarded sit   Standing balance support: Bilateral upper extremity supported Standing balance-Leahy Scale: Fair                              Cognition Arousal/Alertness: Awake/alert Behavior During Therapy: WFL for tasks assessed/performed Overall Cognitive Status: Within Functional Limits for tasks assessed                                        Exercises General Exercises - Lower Extremity Ankle Circles/Pumps: AROM;Both;10 reps Quad Sets: Strengthening;Both;5 reps Gluteal Sets: Strengthening;Both;5 reps Heel Slides: AAROM;Right;5 reps Other Exercises Other Exercises: Educated on performing exercises in sets of 10 as pt nausea symptoms improve/resolve Other Exercises: Additional activity declined due to nausea (BP 147/79, HR 90); RN informed/aware.    General Comments        Pertinent Vitals/Pain Pain Assessment: 0-10 Pain Score: 5  Pain Location: R hip/thigh with movement/walking Pain Descriptors / Indicators: Guarding;Grimacing;Aching;Shooting Pain Intervention(s): Monitored during session;Premedicated before session;Repositioned    Home Living Family/patient expects to be discharged to:: Private residence Living Arrangements: Spouse/significant other Available Help at Discharge: Family Type of Home: House  Home Access: Stairs to enter Entrance Stairs-Rails: Right Home Layout: Two level;Able to live on main level with bedroom/bathroom Home Equipment: None      Prior Function Level of Independence: Independent      Comments: Indep with ADLs, household and community mobilization upon discharge; denies additional fall history   PT  Goals (current goals can now be found in the care plan section) Acute Rehab PT Goals Patient Stated Goal: to do what I can PT Goal Formulation: With patient Time For Goal Achievement: 01/10/19 Potential to Achieve Goals: Good Progress towards PT goals: Progressing toward goals    Frequency    BID      PT Plan Current plan remains appropriate    Co-evaluation              AM-PAC PT "6 Clicks" Mobility   Outcome Measure  Help needed turning from your back to your side while in a flat bed without using bedrails?: A Little Help needed moving from lying on your back to sitting on the side of a flat bed without using bedrails?: A Lot Help needed moving to and from a bed to a chair (including a wheelchair)?: A Little Help needed standing up from a chair using your arms (e.g., wheelchair or bedside chair)?: A Lot Help needed to walk in hospital room?: A Lot Help needed climbing 3-5 steps with a railing? : A Lot 6 Click Score: 14    End of Session Equipment Utilized During Treatment: Gait belt Activity Tolerance: Other (comment)(nausea) Patient left: in bed;with call bell/phone within reach;with bed alarm set;with SCD's reapplied Nurse Communication: Other (comment)(request anti nausea medication) PT Visit Diagnosis: Muscle weakness (generalized) (M62.81);Difficulty in walking, not elsewhere classified (R26.2);Pain Pain - Right/Left: Right Pain - part of body: Hip     Time: [id number] PT Time Calculation (min) (ACUTE ONLY): 25 min  Charges:  $Gait Training: 8-22 mins $Therapeutic Exercise: 8-22 mins $Therapeutic Activity: 8-22 mins                      Larae Grooms, PTA 12/27/2018, 2:04 PM

## 2018-12-27 NOTE — Evaluation (Signed)
Physical Therapy Evaluation Patient Details Name: Kelly Wood MRN: [id number] DOB: 02-17-1941 Today's Date: 12/27/2018   History of Present Illness  presented to ER secondary to mechanical fall with acute onset of R hip pain; admitted for management of displaced R introtrochanteric hip fracture, s/p ORIF with nailing (9/22), WBAT.  Clinical Impression  Upon evaluation, patient alert and oriented; agreeable to session, but generally fearful/hesitant due to pain in R hip.  R hip remains generally guarded, but tolerates approx 40-50% normal ROM with act assist from therapist.  Currently requiring mod assist for bed mobility; min/mod assist for sit/stand, basic transfers and gait (5') with RW.  Decreased stance time/WBing R LE due to pain, mod WBing bilat UEs.  Reports onset of nausea with transition OOB; vitals stable and WFL. Additional gait deferred as result. Will continue to assess/progress in subsequent sessions as medically appropriate. Would benefit from skilled PT to address above deficits and promote optimal return to PLOF; recommend transition to STR upon discharge from acute hospitalization.  Will monitor progress and update as appropriate.     Follow Up Recommendations SNF    Equipment Recommendations  Rolling walker with 5" wheels;3in1 (PT)    Recommendations for Other Services       Precautions / Restrictions Precautions Precautions: Fall Restrictions Weight Bearing Restrictions: Yes RLE Weight Bearing: Weight bearing as tolerated      Mobility  Bed Mobility Overal bed mobility: Needs Assistance Bed Mobility: Supine to Sit     Supine to sit: Mod assist        Transfers Overall transfer level: Needs assistance   Transfers: Sit to/from Stand Sit to Stand: Min assist;Mod assist         General transfer comment: cuing for hand/foot placement; generally slow and guarded, very fearful of pain  Ambulation/Gait Ambulation/Gait assistance: Min assist Gait  Distance (Feet): 5 Feet Assistive device: Rolling walker (2 wheeled)       General Gait Details: 3-point, step to gait pattern with mod WBing bilat UEs; decreased stance time/weight acceptance, decreased R LE step height/length R LE, limited by pain.  Stairs            Wheelchair Mobility    Modified Rankin (Stroke Patients Only)       Balance Overall balance assessment: Needs assistance Sitting-balance support: No upper extremity supported;Feet supported Sitting balance-Leahy Scale: Good     Standing balance support: Bilateral upper extremity supported Standing balance-Leahy Scale: Fair                               Pertinent Vitals/Pain Pain Assessment: 0-10 Pain Score: 7  Pain Location: R hip Pain Descriptors / Indicators: Aching;Guarding;Grimacing Pain Intervention(s): Limited activity within patient's tolerance;Monitored during session;Repositioned    Home Living Family/patient expects to be discharged to:: Private residence Living Arrangements: Spouse/significant other Available Help at Discharge: Family Type of Home: House Home Access: Stairs to enter Entrance Stairs-Rails: Right Entrance Stairs-Number of Steps: 2 Home Layout: Two level;Able to live on main level with bedroom/bathroom Home Equipment: None      Prior Function Level of Independence: Independent         Comments: Indep with ADLs, household and community mobilization upon discharge; denies additional fall history     Hand Dominance        Extremity/Trunk Assessment   Upper Extremity Assessment Upper Extremity Assessment: Overall WFL for tasks assessed    Lower Extremity Assessment Lower Extremity  Assessment: (R hip grossly 3-/5, limited by pain; otherwise, LEs WFL)       Communication   Communication: No difficulties  Cognition Arousal/Alertness: Awake/alert Behavior During Therapy: WFL for tasks assessed/performed Overall Cognitive Status: Within  Functional Limits for tasks assessed                                        General Comments      Exercises Other Exercises Other Exercises: Supine R LE therex, 1x10, act assist ROM: ankle pumps, quad sets, SAQs, heel slides, hip abduct/adduct.  Guarded due to pain.   Assessment/Plan    PT Assessment Patient needs continued PT services  PT Problem List Decreased strength;Decreased range of motion;Decreased activity tolerance;Decreased balance;Decreased mobility;Decreased knowledge of use of DME;Decreased safety awareness;Decreased knowledge of precautions;Decreased skin integrity;Pain       PT Treatment Interventions DME instruction;Gait training;Stair training;Functional mobility training;Therapeutic activities;Therapeutic exercise;Patient/family education    PT Goals (Current goals can be found in the Care Plan section)  Acute Rehab PT Goals Patient Stated Goal: to do what I can PT Goal Formulation: With patient Time For Goal Achievement: 01/10/19 Potential to Achieve Goals: Good    Frequency BID   Barriers to discharge        Co-evaluation               AM-PAC PT "6 Clicks" Mobility  Outcome Measure Help needed turning from your back to your side while in a flat bed without using bedrails?: A Little Help needed moving from lying on your back to sitting on the side of a flat bed without using bedrails?: A Lot Help needed moving to and from a bed to a chair (including a wheelchair)?: A Lot Help needed standing up from a chair using your arms (e.g., wheelchair or bedside chair)?: A Lot Help needed to walk in hospital room?: A Lot Help needed climbing 3-5 steps with a railing? : Total 6 Click Score: 12    End of Session Equipment Utilized During Treatment: Gait belt Activity Tolerance: Patient tolerated treatment well Patient left: in chair;with call bell/phone within reach;with chair alarm set;with family/visitor present;with nursing/sitter in  room Nurse Communication: Mobility status PT Visit Diagnosis: Muscle weakness (generalized) (M62.81);Difficulty in walking, not elsewhere classified (R26.2);Pain Pain - Right/Left: Right Pain - part of body: Hip    Time: [id number] PT Time Calculation (min) (ACUTE ONLY): 42 min   Charges:   PT Evaluation $PT Eval Moderate Complexity: 1 Mod PT Treatments $Therapeutic Exercise: 8-22 mins $Therapeutic Activity: 8-22 mins        Temari Schooler H. Owens Shark, PT, DPT, NCS 12/27/18, 11:04 AM [phone]

## 2018-12-27 NOTE — NC FL2 (Signed)
[place] LEVEL OF CARE SCREENING TOOL     IDENTIFICATION  Patient Name: Kelly Wood Birthdate: 1940-12-15 Sex: female Admission Date (Current Location): 12/26/2018  [place] and [place] Number:  Engineering geologist and Address:  [facility], [address] [place], [place] [address]      Provider Number: [id number]  Attending Physician Name and Address:  Nicholes Mango, MD  Relative Name and Phone Number:       Current Level of Care: Hospital Recommended Level of Care: [place] Prior Approval Number:    Date Approved/Denied:   PASRR Number: [id number]  Discharge Plan: SNF    Current Diagnoses: Patient Active Problem List   Diagnosis Date Noted  . Closed right hip fracture ([place]) 12/26/2018    Orientation RESPIRATION BLADDER Height & Weight     Self, Time, Situation, Place  Normal Continent Weight: 140 lb (63.5 kg) Height:  5\' 2"  (157.5 cm)  BEHAVIORAL SYMPTOMS/MOOD NEUROLOGICAL BOWEL NUTRITION STATUS      Continent Diet(Diet: Heart Healthy/ Carb Modified.)  AMBULATORY STATUS COMMUNICATION OF NEEDS Skin   Extensive Assist Verbally Surgical wounds(Incision: Right Hip.)                       Personal Care Assistance Level of Assistance  Bathing, Feeding, Dressing Bathing Assistance: Limited assistance Feeding assistance: Independent Dressing Assistance: Limited assistance     Functional Limitations Info  Sight, Hearing, Speech Sight Info: Adequate Hearing Info: Adequate Speech Info: Adequate    SPECIAL CARE FACTORS FREQUENCY  PT (By licensed PT), OT (By licensed OT)     PT Frequency: 5 OT Frequency: 5            Contractures      Additional Factors Info  Code Status, Allergies Code Status Info: Full Code. Allergies Info: Azithromycin, Levofloxacin, Sulfa Antibiotics           Current Medications (12/27/2018):  This is the current hospital active medication list Current  Facility-Administered Medications  Medication Dose Route Frequency Provider Last Rate Last Dose  . 0.9 %  sodium chloride infusion   Intravenous Continuous Poggi, Marshall Cork, MD   Stopped at 12/27/18 [phone]  . acetaminophen (TYLENOL) tablet 650 mg  650 mg Oral Q6H PRN Demetrios Loll, MD       Or  . acetaminophen (TYLENOL) suppository 650 mg  650 mg Rectal Q6H PRN Demetrios Loll, MD      . albuterol (PROVENTIL) (2.5 MG/3ML) 0.083% nebulizer solution 2.5 mg  2.5 mg Nebulization Q2H PRN Demetrios Loll, MD      . aspirin EC tablet 81 mg  81 mg Oral Daily Demetrios Loll, MD   81 mg at 12/27/18 0853  . bisacodyl (DULCOLAX) EC tablet 5 mg  5 mg Oral Daily PRN Demetrios Loll, MD      . bisacodyl (DULCOLAX) suppository 10 mg  10 mg Rectal Daily PRN Poggi, Marshall Cork, MD      . ceFAZolin (ANCEF) IVPB 2g/100 mL premix  2 g Intravenous Q6H Poggi, Marshall Cork, MD 200 mL/hr at 12/27/18 [phone]    . diltiazem (CARDIZEM CD) 24 hr capsule 240 mg  240 mg Oral Daily Demetrios Loll, MD   240 mg at 12/27/18 0853  . diphenhydrAMINE (BENADRYL) 12.5 MG/5ML elixir 12.5-25 mg  12.5-25 mg Oral Q4H PRN Poggi, Marshall Cork, MD      . docusate sodium (COLACE) capsule 100 mg  100 mg Oral BID Poggi, Marshall Cork,  MD   100 mg at 12/27/18 0849  . enoxaparin (LOVENOX) injection 40 mg  40 mg Subcutaneous Q24H Poggi, Marshall Cork, MD   40 mg at 12/27/18 0855  . fluticasone (FLONASE) 50 MCG/ACT nasal spray 2 spray  2 spray Each Nare Daily PRN Demetrios Loll, MD      . HYDROcodone-acetaminophen (NORCO/VICODIN) 5-325 MG per tablet 1-2 tablet  1-2 tablet Oral Q4H PRN Demetrios Loll, MD   2 tablet at 12/27/18 [phone]  . HYDROmorphone (DILAUDID) injection 0.25-0.5 mg  0.25-0.5 mg Intravenous Q2H PRN Poggi, Marshall Cork, MD      . influenza vaccine adjuvanted (FLUAD) injection 0.5 mL  0.5 mL Intramuscular Tomorrow-1000 Poggi, Marshall Cork, MD      . levothyroxine (SYNTHROID) tablet 100 mcg  100 mcg Oral Q0600 Demetrios Loll, MD   100 mcg at 12/27/18 0607  . magnesium hydroxide (MILK OF MAGNESIA) suspension 30 mL  30 mL Oral  Daily PRN Poggi, Marshall Cork, MD      . metoCLOPramide (REGLAN) tablet 5-10 mg  5-10 mg Oral Q8H PRN Poggi, Marshall Cork, MD       Or  . metoCLOPramide (REGLAN) injection 5-10 mg  5-10 mg Intravenous Q8H PRN Poggi, Marshall Cork, MD      . morphine 4 MG/ML injection 4 mg  4 mg Intravenous Q4H PRN Demetrios Loll, MD      . ondansetron [facility] tablet 4 mg  4 mg Oral Q6H PRN Poggi, Marshall Cork, MD       Or  . ondansetron (ZOFRAN) injection 4 mg  4 mg Intravenous Q6H PRN Poggi, Marshall Cork, MD      . oxyCODONE (Oxy IR/ROXICODONE) immediate release tablet 5-10 mg  5-10 mg Oral Q4H PRN Poggi, Marshall Cork, MD      . pneumococcal 23 valent vaccine (PNU-IMMUNE) injection 0.5 mL  0.5 mL Intramuscular Tomorrow-1000 Poggi, Marshall Cork, MD      . potassium chloride SA (K-DUR) CR tablet 20 mEq  20 mEq Oral BID Demetrios Loll, MD      . pravastatin (PRAVACHOL) tablet 80 mg  80 mg Oral Daily Demetrios Loll, MD   80 mg at 12/27/18 0850  . senna-docusate (Senokot-S) tablet 1 tablet  1 tablet Oral QHS PRN Demetrios Loll, MD      . sodium phosphate (FLEET) 7-19 GM/118ML enema 1 enema  1 enema Rectal Once PRN Poggi, Marshall Cork, MD      . torsemide [facility]) tablet 20 mg  20 mg Oral Daily Demetrios Loll, MD      . traMADol Veatrice [facility]bon) tablet 50 mg  50 mg Oral Q6H PRN Poggi, Marshall Cork, MD   50 mg at 12/27/18 [id number]     Discharge Medications: Please see discharge summary for a list of discharge medications.  Relevant Imaging Results:  Relevant Lab Results:   Additional Information SSN: [id number]  Kelly Wood, Kelly Beets, LCSW

## 2018-12-27 NOTE — Anesthesia Postprocedure Evaluation (Signed)
Anesthesia Post Note  Patient: Kelly Wood  Procedure(s) Performed: INTRAMEDULLARY (IM) NAIL INTERTROCHANTRIC (Right )  Patient location during evaluation: Nursing Unit Anesthesia Type: Spinal Level of consciousness: awake, awake and alert, oriented and patient cooperative Pain management: pain level controlled Vital Signs Assessment: post-procedure vital signs reviewed and stable Respiratory status: spontaneous breathing, nonlabored ventilation and respiratory function stable Cardiovascular status: stable Postop Assessment: no headache, no backache, no apparent nausea or vomiting, patient able to bend at knees and adequate PO intake Anesthetic complications: no     Last Vitals:  Vitals:   12/27/18 0057 12/27/18 0406  BP: (!) 146/76 (!) 143/77  Pulse: 80 91  Resp: 19 19  Temp: 36.6 C 36.6 C  SpO2: 94% 91%    Last Pain:  Vitals:   12/27/18 0607  TempSrc:   PainSc: 7                  [name],  [name]

## 2018-12-27 NOTE — Progress Notes (Signed)
[place] at [place] NAME: Kelly Wood    MR#:  [id number]  DATE OF BIRTH:  Oct 13, 1940  SUBJECTIVE:  CHIEF COMPLAINT: Patient is reporting right hip pain after working with physical therapy   out of bed to chair  REVIEW OF SYSTEMS:  CONSTITUTIONAL: No fever, fatigue or weakness.  EYES: No blurred or double vision.  EARS, NOSE, AND THROAT: No tinnitus or ear pain.  RESPIRATORY: No cough, shortness of breath, wheezing or hemoptysis.  CARDIOVASCULAR: No chest pain, orthopnea, edema.  GASTROINTESTINAL: No nausea, vomiting, diarrhea or abdominal pain.  GENITOURINARY: No dysuria, hematuria.  ENDOCRINE: No polyuria, nocturia,  HEMATOLOGY: No anemia, easy bruising or bleeding SKIN: No rash or lesion. MUSCULOSKELETAL: Right hip pain NEUROLOGIC: No tingling, numbness, weakness.  PSYCHIATRY: No anxiety or depression.   DRUG ALLERGIES:   Allergies  Allergen Reactions  . Azithromycin Diarrhea  . Levofloxacin Other (See Comments)    Pt prefers not to take because og side effects  . Sulfa Antibiotics     VITALS:  Blood pressure (!) 161/82, pulse (!) 101, temperature 99.6 F (37.6 C), temperature source Oral, resp. rate 19, height 5\' 2"  (1.575 m), weight 63.5 kg, SpO2 93 %.  PHYSICAL EXAMINATION:  GENERAL:  78 y.o.-year-old patient lying in the bed with no acute distress.  EYES: Pupils equal, round, reactive to light and accommodation. No scleral icterus. Extraocular muscles intact.  HEENT: Head atraumatic, normocephalic. Oropharynx and nasopharynx clear.  NECK:  Supple, no jugular venous distention. No thyroid enlargement, no tenderness.  LUNGS: Normal breath sounds bilaterally, no wheezing, rales,rhonchi or crepitation. No use of accessory muscles of respiration.  CARDIOVASCULAR: S1, S2 normal. No murmurs, rubs, or gallops.  ABDOMEN: Soft, nontender, nondistended. Bowel sounds present. EXTREMITIES: Right hip and cleared honeycomb  dressing no pedal edema, cyanosis, or clubbing.  NEUROLOGIC: Cranial nerves II through XII are intact. Muscle strength 5/5 in all extremities except on right lower extremity. Sensation intact. Gait not checked.  PSYCHIATRIC: The patient is alert and oriented x 3.  SKIN: No obvious rash, lesion, or ulcer.    LABORATORY PANEL:   CBC Recent Labs  Lab 12/27/18 0550  WBC 9.3  HGB 9.3*  HCT 29.2*  PLT 168   ------------------------------------------------------------------------------------------------------------------  Chemistries  Recent Labs  Lab 12/27/18 0550  NA 138  K 3.4*  CL 105  CO2 24  GLUCOSE 115*  BUN 13  CREATININE 0.59  CALCIUM 7.8*   ------------------------------------------------------------------------------------------------------------------  Cardiac Enzymes No results for input(s): TROPONINI in the last 168 hours. ------------------------------------------------------------------------------------------------------------------  RADIOLOGY:  Dg Chest 1 View  Result Date: 12/26/2018 CLINICAL DATA:  Pain EXAM: CHEST  1 VIEW COMPARISON:  None. FINDINGS: The heart size is enlarged. Aortic calcifications are noted. There is no pneumothorax. No large pleural effusion. No focal infiltrate. IMPRESSION: No active disease. Electronically Signed   By: Constance Holster M.D.   On: 12/26/2018 13:33   Dg Hip Operative Unilat W Or W/o Pelvis Right  Result Date: 12/27/2018 CLINICAL DATA:  Right hip fixation. EXAM: OPERATIVE right HIP (WITH PELVIS IF PERFORMED) 5 VIEWS TECHNIQUE: Fluoroscopic spot image(s) were submitted for interpretation post-operatively. Radiation exposure index: 7.1 mGy. COMPARISON:  Radiographs of December 26, 2018. FINDINGS: Five intraoperative fluoroscopic images were obtained of the right femur. These demonstrate the patient be status post surgical internal fixation of proximal left femoral fracture with intramedullary rod fixation of the right  femur. IMPRESSION: Fluoroscopic guidance provided during surgical internal fixation of proximal right  femoral fracture and intramedullary rod fixation of the right femur. Electronically Signed   By: Marijo Conception M.D.   On: 12/27/2018 07:03   Dg Hip Unilat  With Pelvis 2-3 Views Right  Result Date: 12/26/2018 CLINICAL DATA:  Right hip pain after fall from standing today. Initial encounter. EXAM: DG HIP (WITH OR WITHOUT PELVIS) 2-3V RIGHT COMPARISON:  None. FINDINGS: The patient has an acute right intertrochanteric fracture. No other acute abnormality is identified. IMPRESSION: Acute right intertrochanteric fracture. Electronically Signed   By: Inge Rise M.D.   On: 12/26/2018 13:34    EKG:   Orders placed or performed during the hospital encounter of 12/26/18  . EKG 12-Lead  . EKG 12-Lead  . ED EKG  . ED EKG    ASSESSMENT AND PLAN:    Right hip fracture. Right hip surgery done on 12/26/2018 Postop care by orthopedics Pain management as needed Physical therapy Monitor a.m. labs   Hypertension.  Continue Cardizem and Lasix.  Hypothyroidism continue Synthyroid   hyperlipidemia.  Continue home medication statin  DVT prophylaxis with Lovenox  Dispositione PT is recommending skilled nursing facility All the records are reviewed and case discussed with Care Management/Social Workerr. Management plans discussed with the patient, family and they are in agreement.  CODE STATUS: fc   TOTAL TIME TAKING CARE OF THIS PATIENT: 35  minutes.   POSSIBLE D/C IN  1-2 DAYS, DEPENDING ON CLINICAL CONDITION.  Note: This dictation was prepared with Dragon dictation along with smaller phrase technology. Any transcriptional errors that result from this process are unintentional.   Nicholes Mango M.D on 12/27/2018 at 9:40 PM  Between 7am to 6pm - Pager - [phone] After 6pm go to [email or web address] - password [email or web address] [place] [facility]  Office  [phone]  CC: Primary care  physician; Idelle Crouch, MD

## 2018-12-27 NOTE — Progress Notes (Addendum)
Subjective: 1 Day Post-Op Procedure(s) (LRB): INTRAMEDULLARY (IM) NAIL INTERTROCHANTRIC (Right) Patient reports pain as mild.   Patient is well, and has had no acute complaints or problems PT and care management to assist with discharge planning, patient is hoping to return home with HHPT. Negative for chest pain and shortness of breath Fever: no Gastrointestinal:Negative for nausea and vomiting  Objective: Vital signs in last 24 hours: Temp:  [97.2 F (36.2 C)-98.4 F (36.9 C)] 97.8 F (36.6 C) (09/23 0406) Pulse Rate:  [62-166] 91 (09/23 0406) Resp:  [14-24] 19 (09/23 0406) BP: (96-180)/(52-98) 143/77 (09/23 0406) SpO2:  [91 %-100 %] 91 % (09/23 0406) Weight:  [63.5 kg] 63.5 kg (09/22 1227)  Intake/Output from previous day:  Intake/Output Summary (Last 24 hours) at 12/27/2018 0737 Last data filed at 12/27/2018 [id number] Gross per 24 hour  Intake 1544.37 ml  Output 1215 ml  Net 329.37 ml    Intake/Output this shift: No intake/output data recorded.  Labs: Recent Labs    12/26/18 1233 12/27/18 0550  HGB 12.8 9.3*   Recent Labs    12/26/18 1233 12/27/18 0550  WBC 8.2 9.3  RBC 4.58 3.33*  HCT 40.4 29.2*  PLT 205 168   Recent Labs    12/26/18 1233 12/27/18 0550  NA 139 138  K 3.8 3.4*  CL 105 105  CO2 23 24  BUN 17 13  CREATININE 0.62 0.59  GLUCOSE 112* 115*  CALCIUM 9.3 7.8*   No results for input(s): LABPT, INR in the last 72 hours.   EXAM General - Patient is Alert, Appropriate and Oriented Extremity - ABD soft Sensation intact distally Intact pulses distally Dorsiflexion/Plantar flexion intact Incision: dressing C/D/I No cellulitis present Dressing/Incision - clean, dry, no drainage Motor Function - intact, moving foot and toes well on exam.   Past Medical History:  Diagnosis Date  . Anemia   . GERD (gastroesophageal reflux disease)   . Hyperlipidemia   . Hypothyroidism     Assessment/Plan: 1 Day Post-Op Procedure(s)  (LRB): INTRAMEDULLARY (IM) NAIL INTERTROCHANTRIC (Right) Active Problems:   Closed right hip fracture (HCC)  Estimated body mass index is 25.61 kg/m as calculated from the following:   Height as of this encounter: 5\' 2"  (1.575 m).   Weight as of this encounter: 63.5 kg. Advance diet Up with therapy D/C IV fluids  When tolerating po intake.  Labs reviewed this AM.  Hg 9.3, WBC 9.3 K+ 3.4, continue to monitor. Begin working on BM, patient is passing gas. Up with therapy today.  DVT Prophylaxis - Lovenox and SCDs Weight-Bearing as tolerated to right leg  J. Cameron Proud, PA-C [facility] Orthopaedic Surgery 12/27/2018, 7:37 AM

## 2018-12-27 NOTE — TOC Initial Note (Signed)
Transition of Care [facility]) - Initial/Assessment Note    Patient Details  Name: Kelly Wood MRN: [id number] Date of Birth: 16-Mar-1941  Transition of Care [facility]) CM/SW Contact:    Navayah Sok, Lenice Llamas Phone Number: [phone]  12/27/2018, 3:33 PM  Clinical Narrative: Clinical Social Worker (CSW) met with patient to discuss D/C plan. PT is recommending SNF. Patient was alert and oriented X4 and was sitting up in the chair at bedside. CSW introduced self and explained role of CSW department. Patient reported that she lives in [place] with her husband Kelly Wood. CSW explained SNF process. Patient adamantly refused SNF and reported that she is going home. Patient reported that she will have 24/7 supervision from her husband, friends and neighbors. CSW explained the benefit of SNF and patient continued to refuse SNF. Patient is agreeable to home health and does not have a preference of a home health agency. Per [facility] representative they will accept patient. Patient is agreeable to [facility]. CSW provided patient CMS home health list. Patient requested a rolling walker and bedside commode. [employer] agency representative delivered rolling walker and bedside commode. Lovenox price requested. CSW attempted to contact patient's husband Kelly Wood however he did not answer and a voicemail was left. CSW will continue to follow and assist as needed.                  Expected Discharge Plan: [place] Barriers to Discharge: Continued Medical Work up   Patient Goals and CMS Choice Patient states their goals for this hospitalization and ongoing recovery are:: To go home. CMS Medicare.gov Compare Post Acute Care list provided to:: Patient Choice offered to / list presented to : Patient  Expected Discharge Plan and Services Expected Discharge Plan: [place] In-house Referral: Clinical Social Work   Post Acute Care Choice: [place]  arrangements for the past 2 months: [place]                 DME Arranged: Bedside commode, Walker rolling DME Agency: [employer] Date DME Agency Contacted: 12/27/18 Time DME Agency Contacted: 59 Representative spoke with at DME Agency: [place]: PT, OT, Nurse's Aide [place] Agency: [place] ([place]) Date [place]: 12/27/18 Time HH Agency Contacted: 0130 Representative spoke with at [place]: [place] Arrangements/Services Living arrangements for the past 2 months: [place] Lives with:: Spouse Patient language and need for interpreter reviewed:: No Do you feel safe going back to the place where you live?: Yes      Need for Family Participation in Patient Care: Yes (Comment) Care giver support system in place?: Yes (comment)   Criminal Activity/Legal Involvement Pertinent to Current Situation/Hospitalization: No - Comment as needed  Activities of Daily Living Home Assistive Devices/Equipment: None ADL Screening (condition at time of admission) Patient's cognitive ability adequate to safely complete daily activities?: Yes Is the patient deaf or have difficulty hearing?: No Does the patient have difficulty seeing, even when wearing glasses/contacts?: No Does the patient have difficulty concentrating, remembering, or making decisions?: No Patient able to express need for assistance with ADLs?: Yes Does the patient have difficulty dressing or bathing?: No Independently performs ADLs?: Yes (appropriate for developmental age) Does the patient have difficulty walking or climbing stairs?: Yes Weakness of Legs: Right Weakness of Arms/Hands: None  Permission Sought/Granted Permission sought to share information with : Other (comment)(Home Healthy agency and DME agency)  Permission granted to share information with : Yes, Verbal Permission Granted              Emotional Assessment Appearance:: Appears stated  age Attitude/Demeanor/Rapport: Engaged Affect (typically observed): Pleasant, Calm Orientation: : Oriented to Self, Oriented to Place, Oriented to  Time, Oriented to Situation Alcohol / Substance Use: Not Applicable Psych Involvement: No (comment)  Admission diagnosis:  Fall [W19.XXXA] Elective surgery [Z41.9] Fall, initial encounter [W19.XXXA] Closed fracture of right hip, initial encounter [facility]) [S72.001A] Patient Active Problem List   Diagnosis Date Noted  . Closed right hip fracture ([place]) 12/26/2018   PCP:  Idelle Crouch, MD Pharmacy:   [id number] [place] [address]Phone: [phone]: [address] [address]Phone: [phone]: [phone]    Social Determinants of Health (SDOH) Interventions    Readmission Risk Interventions No flowsheet data found.

## 2018-12-28 LAB — BASIC METABOLIC PANEL
Anion gap: 10 (ref 5–15)
BUN: 13 mg/dL (ref 8–23)
CO2: 23 mmol/L (ref 22–32)
Calcium: 8 mg/dL — ABNORMAL LOW (ref 8.9–10.3)
Chloride: 103 mmol/L (ref 98–111)
Creatinine, Ser: 0.6 mg/dL (ref 0.44–1.00)
GFR calc Af Amer: >60 mL/min (ref >60)
GFR calc non Af Amer: >60 mL/min (ref >60)
Glucose, Bld: 120 mg/dL — ABNORMAL HIGH (ref 70–99)
Potassium: 3.5 mmol/L (ref 3.5–5.1)
Sodium: 136 mmol/L (ref 135–145)

## 2018-12-28 LAB — CBC
HCT: 30 % — ABNORMAL LOW (ref 36.0–46.0)
Hemoglobin: 9.7 g/dL — ABNORMAL LOW (ref 12.0–15.0)
MCH: 28.4 pg (ref 26.0–34.0)
MCHC: 32.3 g/dL (ref 30.0–36.0)
MCV: 88 fL (ref 80.0–100.0)
Platelets: 156 10*3/uL (ref 150–400)
RBC: 3.41 MIL/uL — ABNORMAL LOW (ref 3.87–5.11)
RDW: 13 % (ref 11.5–15.5)
WBC: 9 10*3/uL (ref 4.0–10.5)
nRBC: 0 % (ref 0.0–0.2)

## 2018-12-28 MED ORDER — HYDROCODONE-ACETAMINOPHEN 5-325 MG PO TABS: 1.0000 | ORAL_TABLET | ORAL | 0 refills | Status: DC | PRN

## 2018-12-28 MED ORDER — ENOXAPARIN SODIUM 40 MG/0.4ML ~~LOC~~ SOLN: 40.0000 mg | SUBCUTANEOUS | 0 refills | Status: DC

## 2018-12-28 MED ORDER — PANTOPRAZOLE SODIUM 40 MG PO TBEC
40.0000 mg | DELAYED_RELEASE_TABLET | Freq: Every day | ORAL | Status: DC
  Administered 2018-12-28 – 2018-12-29 (×2): 40 mg via ORAL
  Filled 2018-12-28 (×2): qty 1

## 2018-12-28 MED ORDER — KETOROLAC TROMETHAMINE 10 MG PO TABS
10.0000 mg | ORAL_TABLET | Freq: Three times a day (TID) | ORAL | Status: DC | PRN
  Administered 2018-12-28 – 2018-12-29 (×3): 10 mg via ORAL
  Filled 2018-12-28 (×4): qty 1

## 2018-12-28 NOTE — Progress Notes (Signed)
Kelly Wood at [place] NAME: Kelly Wood    MR#:  [id number]  DATE OF BIRTH:  1940-04-27  SUBJECTIVE:  CHIEF COMPLAINT: Patient is resting comfortably.  She is not quite sure that she could go home with home health or not.  Patient's husband being 76 year old might not be able to take care of her at home, patient may consider going to SNF  REVIEW OF SYSTEMS:  CONSTITUTIONAL: No fever, fatigue or weakness.  EYES: No blurred or double vision.  EARS, NOSE, AND THROAT: No tinnitus or ear pain.  RESPIRATORY: No cough, shortness of breath, wheezing or hemoptysis.  CARDIOVASCULAR: No chest pain, orthopnea, edema.  GASTROINTESTINAL: No nausea, vomiting, diarrhea or abdominal pain.  GENITOURINARY: No dysuria, hematuria.  ENDOCRINE: No polyuria, nocturia,  HEMATOLOGY: No anemia, easy bruising or bleeding SKIN: No rash or lesion. MUSCULOSKELETAL: Right hip pain NEUROLOGIC: No tingling, numbness, weakness.  PSYCHIATRY: No anxiety or depression.   DRUG ALLERGIES:   Allergies  Allergen Reactions  . Azithromycin Diarrhea  . Levofloxacin Other (See Comments)    Pt prefers not to take because og side effects  . Sulfa Antibiotics     VITALS:  Blood pressure 122/65, pulse 67, temperature 99.3 F (37.4 C), temperature source Oral, resp. rate 17, height 5\' 2"  (1.575 m), weight 63.5 kg, SpO2 98 %.  PHYSICAL EXAMINATION:  GENERAL:  78 y.o.-year-old patient lying in the bed with no acute distress.  EYES: Pupils equal, round, reactive to light and accommodation. No scleral icterus. Extraocular muscles intact.  HEENT: Head atraumatic, normocephalic. Oropharynx and nasopharynx clear.  NECK:  Supple, no jugular venous distention. No thyroid enlargement, no tenderness.  LUNGS: Normal breath sounds bilaterally, no wheezing, rales,rhonchi or crepitation. No use of accessory muscles of respiration.  CARDIOVASCULAR: S1, S2 normal. No murmurs, rubs, or  gallops.  ABDOMEN: Soft, nontender, nondistended. Bowel sounds present. EXTREMITIES: Right hip and cleared honeycomb dressing no pedal edema, cyanosis, or clubbing.  NEUROLOGIC: Cranial nerves II through XII are intact. Muscle strength 5/5 in all extremities except on right lower extremity. Sensation intact. Gait not checked.  PSYCHIATRIC: The patient is alert and oriented x 3.  SKIN: No obvious rash, lesion, or ulcer.    LABORATORY PANEL:   CBC Recent Labs  Lab 12/28/18 0500  WBC 9.0  HGB 9.7*  HCT 30.0*  PLT 156   ------------------------------------------------------------------------------------------------------------------  Chemistries  Recent Labs  Lab 12/28/18 0500  NA 136  K 3.5  CL 103  CO2 23  GLUCOSE 120*  BUN 13  CREATININE 0.60  CALCIUM 8.0*   ------------------------------------------------------------------------------------------------------------------  Cardiac Enzymes No results for input(s): TROPONINI in the last 168 hours. ------------------------------------------------------------------------------------------------------------------  RADIOLOGY:  Dg Hip Operative Unilat W Or W/o Pelvis Right  Result Date: 12/27/2018 CLINICAL DATA:  Right hip fixation. EXAM: OPERATIVE right HIP (WITH PELVIS IF PERFORMED) 5 VIEWS TECHNIQUE: Fluoroscopic spot image(s) were submitted for interpretation post-operatively. Radiation exposure index: 7.1 mGy. COMPARISON:  Radiographs of December 26, 2018. FINDINGS: Five intraoperative fluoroscopic images were obtained of the right femur. These demonstrate the patient be status post surgical internal fixation of proximal left femoral fracture with intramedullary rod fixation of the right femur. IMPRESSION: Fluoroscopic guidance provided during surgical internal fixation of proximal right femoral fracture and intramedullary rod fixation of the right femur. Electronically Signed   By: Marijo Conception M.D.   On: 12/27/2018 07:03     EKG:   Orders placed or performed during the hospital encounter  of 12/26/18  . EKG 12-Lead  . EKG 12-Lead  . ED EKG  . ED EKG    ASSESSMENT AND PLAN:    Right hip fracture. Right hip surgery done on 12/26/2018 Postop care by orthopedics Pain management as needed Physical therapy is recommending skilled nursing facility Hemoglobin 12.3-postoperatively 9.3-9.7 Monitor a.m. labs  Blood loss anemia from surgery Monitor hemoglobin closely and transfuse if needed   Hypertension.  Continue Cardizem and Lasix.  Hypothyroidism continue Synthyroid   hyperlipidemia.  Continue home medication statin  DVT prophylaxis with Lovenox  Dispositione PT is recommending skilled nursing facility .  Patient will discuss with her husband and make a decision whether she wants to go to rehab center or home with home health social worker is following   All the records are reviewed and case discussed with Care Management/Social Workerr. Management plans discussed with the patient, family and they are in agreement.  CODE STATUS: fc   TOTAL TIME TAKING CARE OF THIS PATIENT: 35  minutes.   POSSIBLE D/C IN  1-2 DAYS, DEPENDING ON CLINICAL CONDITION.  Note: This dictation was prepared with Dragon dictation along with smaller phrase technology. Any transcriptional errors that result from this process are unintentional.   Nicholes Mango M.D on 12/28/2018 at 3:39 PM  Between 7am to 6pm - Pager - [phone] After 6pm go to [email or web address] - password [email or web address] [place] [place] [facility]  Office  [phone]  CC: Primary care physician; Idelle Crouch, MD

## 2018-12-28 NOTE — Progress Notes (Signed)
Subjective: 2 Days Post-Op Procedure(s) (LRB): INTRAMEDULLARY (IM) NAIL INTERTROCHANTRIC (Right) Patient reports pain as mild.   Patient is well, and has had no acute complaints or problems Negative for chest pain and shortness of breath. Fever: no Gastrointestinal:Negative for nausea and vomiting  Objective: Vital signs in last 24 hours: Temp:  [97.7 F (36.5 C)-99.6 F (37.6 C)] 97.7 F (36.5 C) (09/24 0734) Pulse Rate:  [75-101] 85 (09/24 0734) Resp:  [18-19] 18 (09/24 0734) BP: (130-161)/(75-84) 135/75 (09/24 0734) SpO2:  [92 %-95 %] 93 % (09/24 0734)  Intake/Output from previous day:  Intake/Output Summary (Last 24 hours) at 12/28/2018 0858 Last data filed at 12/27/2018 1700 Gross per 24 hour  Intake 1585.37 ml  Output 1 ml  Net 1584.37 ml    Intake/Output this shift: No intake/output data recorded.  Labs: Recent Labs    12/26/18 1233 12/27/18 0550 12/28/18 0500  HGB 12.8 9.3* 9.7*   Recent Labs    12/27/18 0550 12/28/18 0500  WBC 9.3 9.0  RBC 3.33* 3.41*  HCT 29.2* 30.0*  PLT 168 156   Recent Labs    12/27/18 0550 12/28/18 0500  NA 138 136  K 3.4* 3.5  CL 105 103  CO2 24 23  BUN 13 13  CREATININE 0.59 0.60  GLUCOSE 115* 120*  CALCIUM 7.8* 8.0*   No results for input(s): LABPT, INR in the last 72 hours.   EXAM General - Patient is Alert, Appropriate and Oriented Extremity - ABD soft Sensation intact distally Intact pulses distally Dorsiflexion/Plantar flexion intact Incision: dressing C/D/I No cellulitis present Dressing/Incision - clean, dry, no drainage Motor Function - intact, moving foot and toes well on exam.   Past Medical History:  Diagnosis Date  . Anemia   . GERD (gastroesophageal reflux disease)   . Hyperlipidemia   . Hypothyroidism     Assessment/Plan: 2 Days Post-Op Procedure(s) (LRB): INTRAMEDULLARY (IM) NAIL INTERTROCHANTRIC (Right) Active Problems:   Closed right hip fracture (HCC)  Estimated body mass index  is 25.61 kg/m as calculated from the following:   Height as of this encounter: 5\' 2"  (1.575 m).   Weight as of this encounter: 63.5 kg. Advance diet Up with therapy   Patient with good BM yesterday Labs reviewed this AM.  Hg 9.7, stable, trending up K+ 3.5, stable, trending up. Up with therapy today.   Follow-up with [facility] in 2 weeks for staple removal and Steri-Strip application. Compression stockings bilateral lower extremities x6 weeks Lovenox 40 mg subcu daily x14 days at discharge   DVT Prophylaxis - Lovenox and SCDs Weight-Bearing as tolerated to right leg  T. Rachelle Hora, PA-C [facility] Orthopaedic Surgery 12/28/2018, 8:58 AM

## 2018-12-28 NOTE — Progress Notes (Signed)
Physical Therapy Treatment Patient Details Name: Kelly Wood MRN: [id number] DOB: Dec 10, 1940 Today's Date: 12/28/2018    History of Present Illness presented to ER secondary to mechanical fall with acute onset of R hip pain; admitted for management of displaced R introtrochanteric hip fracture, s/p ORIF with nailing (9/22), WBAT.    PT Comments    Pt in bed upon arrival and agreed to participate with PT. Pt is progressing well towards PT goals. Pt performed all functional mobility today with min/min guard assist. Pt performed therex requiring min cuing and physical assistance for correct performance. Pt required min assist to rise x3 trials and for stand pivot transfers. Pt progressed ambulation today with improved safety. Pt reported having a bad night and not feeling well yesterday limiting her in therapy. Pt on 2L O2 nasal cannula upon arrival. Pt ambulated on room air with SpO2 dropping to 89%, improving to 91% with cuing for deep breaths. SpO2 89% after ambulation sitting in recliner and immediately increased to greater than 90% with cuing for deep breaths. Pt reports using incentive spirometer throughout the day.  Pt asked multiple questions related to concerns over discharge planning. Pt does not want to go to short term rehab due to concerns of Coronavirus and not having visitors. Pt and pts husband, who arrived end of session, believe they can handle everything on their own at home. Pt reports having multiple neighbors, including a nurse, who are willing and able to provide assistance as necessary. Pt is agreeable to home health PT. Pt presents with decreased strength, ROM, balance and activity tolerance limiting functional mobility. Pt would benefit from further skilled therapy to address deficits. PT scheduled afternoon session to include caregiver education to determine if pt's spouse is able to provide the appropriate assistance needed in a safe manner. Pt reports having two steps to  enter home so stair training with caregiver will be assessed tomorrow as appropriate.   Follow Up Recommendations  SNF;Supervision for mobility/OOB     Equipment Recommendations  Rolling walker with 5" wheels;3in1 (PT)    Recommendations for Other Services       Precautions / Restrictions Precautions Precautions: Fall Restrictions Weight Bearing Restrictions: Yes RLE Weight Bearing: Weight bearing as tolerated    Mobility  Bed Mobility Overal bed mobility: Needs Assistance Bed Mobility: Sit to Supine       Sit to supine: Min assist;HOB elevated   General bed mobility comments: min assist for LEs, cuing required for proper sequencing, pt utilized increased time, bed rails and LLE to help sit EOB  Transfers Overall transfer level: Needs assistance Equipment used: Rolling walker (2 wheeled) Transfers: Sit to/from [facility] Sit to Stand: Min assist Stand pivot transfers: Min assist       General transfer comment: min A to rise x3 trials from bed, BSC and recliner, pt required cuing for hand and foot placement, min A for stand pivot with cuing for walker mgt  Ambulation/Gait Ambulation/Gait assistance: Min guard Gait Distance (Feet): 15 Feet Assistive device: Rolling walker (2 wheeled) Gait Pattern/deviations: Step-to pattern;Decreased step length - right;Decreased stance time - right;Decreased weight shift to right;Antalgic;Trunk flexed Gait velocity: decreased   General Gait Details: pt ambulated in room with antalgic gait pattern, pt relied heavily on UE support from RW, pt ambulated with no unsteadiness, buckling or LOB, pt required min cuing for walker mgt, decreased weight shift and stance time on R secondary to pain   Stairs  Wheelchair Mobility    Modified Rankin (Stroke Patients Only)       Balance Overall balance assessment: Needs assistance Sitting-balance support: Bilateral upper extremity supported;Feet  supported Sitting balance-Leahy Scale: Good     Standing balance support: Bilateral upper extremity supported;During functional activity Standing balance-Leahy Scale: Fair Standing balance comment: pt reliant on UE support for standing balance secondary to pain and dec. WBing on R                            Cognition Arousal/Alertness: Awake/alert Behavior During Therapy: WFL for tasks assessed/performed Overall Cognitive Status: Within Functional Limits for tasks assessed                                        Exercises Total Joint Exercises Ankle Circles/Pumps: AROM;Both;15 reps Quad Sets: AROM;Right;10 reps Gluteal Sets: AROM;Both;10 reps Towel Squeeze: AROM;Both;10 reps Hip ABduction/ADduction: AAROM;Right;10 reps Straight Leg Raises: AAROM;Right;10 reps    General Comments        Pertinent Vitals/Pain Pain Score: 5  Pain Location: R hip Pain Descriptors / Indicators: Guarding;Aching Pain Intervention(s): Limited activity within patient's tolerance;Monitored during session;Premedicated before session;Repositioned    Home Living                      Prior Function            PT Goals (current goals can now be found in the care plan section) Progress towards PT goals: Progressing toward goals    Frequency    BID      PT Plan Current plan remains appropriate    Co-evaluation              AM-PAC PT "6 Clicks" Mobility   Outcome Measure  Help needed turning from your back to your side while in a flat bed without using bedrails?: A Little Help needed moving from lying on your back to sitting on the side of a flat bed without using bedrails?: A Lot Help needed moving to and from a bed to a chair (including a wheelchair)?: A Little Help needed standing up from a chair using your arms (e.g., wheelchair or bedside chair)?: A Little Help needed to walk in hospital room?: A Little Help needed climbing 3-5 steps with a  railing? : A Lot 6 Click Score: 16    End of Session Equipment Utilized During Treatment: Gait belt;Oxygen Activity Tolerance: Patient tolerated treatment well;Patient limited by pain Patient left: in chair;with call bell/phone within reach;with SCD's reapplied;with family/visitor present Nurse Communication: Mobility status PT Visit Diagnosis: Muscle weakness (generalized) (M62.81);Difficulty in walking, not elsewhere classified (R26.2);Pain Pain - Right/Left: Right Pain - part of body: Hip     Time: [id number] PT Time Calculation (min) (ACUTE ONLY): 53 min  Charges:  $Therapeutic Exercise: 8-22 mins $Therapeutic Activity: 23-37 mins                     Idabelle Mcpeters PT, DPT 10:54 AM,12/28/18 [phone]

## 2018-12-28 NOTE — Progress Notes (Signed)
Pt o2 88% started on 2l of oxygen via Nasal Canula came upo to 93% . Lung sounds clear and diminished in bilateral lungs

## 2018-12-28 NOTE — TOC Progression Note (Signed)
Transition of Care [facility]) - Progression Note    Patient Details  Name: Kelly Wood MRN: [id number] Date of Birth: 1940-05-26  Transition of Care [facility]) CM/SW [place], RN Phone Number: 12/28/2018, 9:38 AM  Clinical Narrative:     Met with the patient and discussed the need to go to rehab.  She stated that she did not have a good night and she does not know if her husband would be able to get her up or not.  We reviewed the bed offers for rehab.  I explained that none of the rehab facilities offers visitors and they all quarantine the patients for 14 days, she stated that she would accept the bed at [facility] due to she has several friends that have gone there. I notified Otila Kluver at [facility] and she stated that she would start the insurance authorization. The patient inquired about personal care assistance for the home, I explained that her insurance does not cover it however There are a few companies, I provided her with a list with 6 companies, I explained that they may not be accepting new patients, she would need to call to find out.  I explained that I do not know anything about the companies and can not recommend one over another, she stated that she would talk to her husband but to go ahead with [facility] for now   Expected Discharge Plan: [place] Barriers to Discharge: Continued Medical Work up  Expected Discharge Plan and Services Expected Discharge Plan: Long In-house Referral: Clinical Social Work   Post Acute Care Choice: [place] arrangements for the past 2 months: Single Family Home                 DME Arranged: Bedside commode, Walker rolling DME Agency: [employer] Date DME Agency Contacted: 12/27/18 Time DME Agency Contacted: 10 Representative spoke with at DME Agency: [place]: PT, OT, Nurse's Aide [place] Agency: [place] ([place]) Date [place]: 12/27/18 Time  [place]: 0130 Representative spoke with at [place]: [place] ([place]) Interventions    Readmission Risk Interventions No flowsheet data found.

## 2018-12-28 NOTE — Progress Notes (Signed)
Physical Therapy Treatment Patient Details Name: Kelly Wood MRN: [id number] DOB: Jul 24, 1940 Today's Date: 12/28/2018    History of Present Illness presented to ER secondary to mechanical fall with acute onset of R hip pain; admitted for management of displaced R introtrochanteric hip fracture, s/p ORIF with nailing (9/22), WBAT.    PT Comments    Pt in recliner upon arrival with spouse present and agreed to participate with PT. Pt is progressing well towards PT goals. Pt progressed ambulation tolerance with improved gait pattern. Pts spouse educated on appropriate guarding technique and use of gait belt. Pt and spouse both report the longest distance pt needs to be able to walk in the home without taking a rest is approximately 25 ft. Pt SpO2 88% after ambulation and quickly returned to >90% with seated rest and cuing for deep breaths. Pt denied feeling short of breath. Pt educated on seated and supine therex to perform daily as HEP with pt and spouse verbalizing understanding and requesting a handout. Pt performed bed mobility tasks with min assist. Pts spouse educated on use of gait belt during mobility and how to assist pt with functional mobility tasks including bed mobility, transfers to/from [facility], LE dressing, ambulation and stair navigation. Pt and pts spouse report feeling like they can manage at home and neither want to pursue short term rehab options. Pt reports her son will be able to assist her with the stairs to enter home as well. Pt educated on stair navigation with sideways technique and PT will assess stair navigation with spouse tomorrow morning. Pt presents with decreased strength, ROM, balance and activity tolerance consistent with recent fx. Pt will benefit from skilled acute therapy to improve deficits to increase independence, allow for return to PLOF and decrease caregiver burden.  Follow Up Recommendations  SNF;Supervision for mobility/OOB     Equipment Recommendations  Rolling walker with 5" wheels;3in1 (PT)    Recommendations for Other Services       Precautions / Restrictions Precautions Precautions: Fall Restrictions Weight Bearing Restrictions: Yes RLE Weight Bearing: Weight bearing as tolerated    Mobility  Bed Mobility Overal bed mobility: Needs Assistance Bed Mobility: Sit to Supine       Sit to supine: Min assist   General bed mobility comments: min assist for sit to supine for LE advancement, bed flat and no bed rails to best mimic home set up, pt utilized increased time and effort and was safe with bed mobility  Transfers Overall transfer level: Needs assistance Equipment used: Rolling walker (2 wheeled) Transfers: Sit to/from [facility] Sit to Stand: Min guard Stand pivot transfers: Min guard       General transfer comment: min guard A for transfers from recliner, pt required min cuing for hand and foot placement but demonstrated good carryover from prior session, no assist required for walker mgt this session  Ambulation/Gait Ambulation/Gait assistance: Min guard Gait Distance (Feet): 50 Feet Assistive device: Rolling walker (2 wheeled) Gait Pattern/deviations: Step-to pattern;Decreased step length - right;Decreased stance time - right;Decreased weight shift to right;Antalgic;Trunk flexed Gait velocity: decreased   General Gait Details: pt ambulated ~50 ft with RW and chair follow from spouse for safety, pt ambulated with decreased gait speed, decreased step length and weight shift on R although improved from this morning, no unsteadiness, buckling or LOB noted   Stairs             Wheelchair Mobility    Modified Rankin (Stroke Patients Only)  Balance Overall balance assessment: Needs assistance Sitting-balance support: Single extremity supported;Feet supported Sitting balance-Leahy Scale: Good     Standing balance support: Bilateral upper extremity supported;During functional  activity Standing balance-Leahy Scale: Fair Standing balance comment: pt reliant on UE support for standing balance secondary to pain and dec. WBing on R                            Cognition Arousal/Alertness: Awake/alert Behavior During Therapy: WFL for tasks assessed/performed Overall Cognitive Status: Within Functional Limits for tasks assessed                                        Exercises Total Joint Exercises Ankle Circles/Pumps: AROM;Both;15 reps Long Arc Quad: AROM;Both;10 reps Marching in Standing: AROM;Both;10 reps;Seated Other Exercises Other Exercises: educated and review bed level therex from this morning, encouraged pt to do exercises couple times a day    General Comments        Pertinent Vitals/Pain Pain Assessment: No/denies pain    Home Living                      Prior Function            PT Goals (current goals can now be found in the care plan section) Progress towards PT goals: Progressing toward goals    Frequency    BID      PT Plan Current plan remains appropriate    Co-evaluation              AM-PAC PT "6 Clicks" Mobility   Outcome Measure  Help needed turning from your back to your side while in a flat bed without using bedrails?: A Little Help needed moving from lying on your back to sitting on the side of a flat bed without using bedrails?: A Little Help needed moving to and from a bed to a chair (including a wheelchair)?: A Little Help needed standing up from a chair using your arms (e.g., wheelchair or bedside chair)?: A Little Help needed to walk in hospital room?: A Little Help needed climbing 3-5 steps with a railing? : A Lot 6 Click Score: 17    End of Session Equipment Utilized During Treatment: Gait belt;Oxygen Activity Tolerance: Patient tolerated treatment well Patient left: in bed;with call bell/phone within reach;with SCD's reapplied;with family/visitor present;with  nursing/sitter in room Nurse Communication: Mobility status PT Visit Diagnosis: Muscle weakness (generalized) (M62.81);Difficulty in walking, not elsewhere classified (R26.2);Pain Pain - Right/Left: Right Pain - part of body: Hip     Time: 1319-1415 PT Time Calculation (min) (ACUTE ONLY): 56 min  Charges:  $Therapeutic Exercise: 23-37 mins $Therapeutic Activity: 23-37 mins                     Gal Feldhaus PT, DPT 3:39 PM,12/28/18 [phone]

## 2018-12-28 NOTE — TOC Progression Note (Signed)
Transition of Care [facility]) - Progression Note    Patient Details  Name: Kelly Wood MRN: [id number] Date of Birth: 02-Aug-1940  Transition of Care [facility]) CM/SW [place], RN Phone Number: 12/28/2018, 3:58 PM  Clinical Narrative:     Met with Patient and Husband in the room, she has decided that after working with PT a couple of times today she will be able to go home with [facility].  She stated that the Physical Therapist showed them exercises to do at home and provided them with an instructional pamphlet.  She feels confident that she will do ok at home.   Expected Discharge Plan: [place] Barriers to Discharge: Continued Medical Work up  Expected Discharge Plan and Services Expected Discharge Plan: [place] In-house Referral: Clinical Social Work   Post Acute Care Choice: [place] arrangements for the past 2 months: [place]                 DME Arranged: Bedside commode, Walker rolling DME Agency: [employer] Date DME Agency Contacted: 12/27/18 Time DME Agency Contacted: 36 Representative spoke with at DME Agency: [place]: PT, OT, Nurse's Aide [place] Agency: [place] ([place]) Date [place]: 12/27/18 Time [place]: 0130 Representative spoke with at [place]: [place] ([place]) Interventions    Readmission Risk Interventions No flowsheet data found.

## 2018-12-29 LAB — BASIC METABOLIC PANEL
Anion gap: 6 (ref 5–15)
BUN: 17 mg/dL (ref 8–23)
CO2: 25 mmol/L (ref 22–32)
Calcium: 8 mg/dL — ABNORMAL LOW (ref 8.9–10.3)
Chloride: 107 mmol/L (ref 98–111)
Creatinine, Ser: 0.54 mg/dL (ref 0.44–1.00)
GFR calc Af Amer: >60 mL/min (ref >60)
GFR calc non Af Amer: >60 mL/min (ref >60)
Glucose, Bld: 106 mg/dL — ABNORMAL HIGH (ref 70–99)
Potassium: 3.7 mmol/L (ref 3.5–5.1)
Sodium: 138 mmol/L (ref 135–145)

## 2018-12-29 LAB — CBC
HCT: 27.2 % — ABNORMAL LOW (ref 36.0–46.0)
Hemoglobin: 8.7 g/dL — ABNORMAL LOW (ref 12.0–15.0)
MCH: 28.2 pg (ref 26.0–34.0)
MCHC: 32 g/dL (ref 30.0–36.0)
MCV: 88 fL (ref 80.0–100.0)
Platelets: 151 10*3/uL (ref 150–400)
RBC: 3.09 MIL/uL — ABNORMAL LOW (ref 3.87–5.11)
RDW: 13.1 % (ref 11.5–15.5)
WBC: 8 10*3/uL (ref 4.0–10.5)
nRBC: 0 % (ref 0.0–0.2)

## 2018-12-29 MED ORDER — ALUM & MAG HYDROXIDE-SIMETH 200-200-20 MG/5ML PO SUSP: 30.0000 mL | Freq: Four times a day (QID) | ORAL | 0 refills | Status: DC | PRN

## 2018-12-29 MED ORDER — ALUM & MAG HYDROXIDE-SIMETH 200-200-20 MG/5ML PO SUSP
30.0000 mL | Freq: Four times a day (QID) | ORAL | Status: DC | PRN
  Administered 2018-12-29: 10:00:00 30 mL via ORAL
  Filled 2018-12-29: qty 30

## 2018-12-29 MED ORDER — DOCUSATE SODIUM 100 MG PO CAPS: 100.0000 mg | ORAL_CAPSULE | Freq: Two times a day (BID) | ORAL | 0 refills | Status: DC

## 2018-12-29 MED ORDER — IRON (FERROUS SULFATE) 325 (65 FE) MG PO TABS: 1.0000 | ORAL_TABLET | Freq: Two times a day (BID) | ORAL | 1 refills | Status: DC

## 2018-12-29 NOTE — TOC Transition Note (Signed)
Transition of Care [facility]) - CM/SW Discharge Note   Patient Details  Name: Kelly Wood MRN: [id number] Date of Birth: 1940-11-29  Transition of Care [facility]) CM/SW Contact:  Arlen Dupuis, Lenice Llamas Phone Number: [phone]  12/29/2018, 12:04 PM   Clinical Narrative: Clinical Social Worker (CSW) met with patient and her husband Kelly Wood was at bedside. Patient reported that she has decided to go home with home health. Patient is agreeable to [place]. [place] representative is aware of D/C today. Patient's rolling walker and bedside commode have been delivered to patient's room. Per patient's husband he has taken rolling walker and bedside commode home. CSW made patient and husband aware that price of Lovenox is $249.35. CSW also provided patient and husband good Rx card and explained that patient can get Lovenox at [facility] for $92.52 with the good Rx card. CSW explained that if patient uses the good Rx card it will not go towards her deductible. Patient reported that she will use the good Rx card. RN will do Lovenox teaching. Patient and husband reported no other needs or concerns. Per patient she will D/C home today after her second session with PT. RN aware of above. Please reconsult if future social work needs arise. CSW signing off.     Final next level of care: [place] Barriers to Discharge: Barriers Resolved   Patient Goals and CMS Choice Patient states their goals for this hospitalization and ongoing recovery are:: To go home. CMS Medicare.gov Compare Post Acute Care list provided to:: Patient Choice offered to / list presented to : Patient  Discharge Placement                       Discharge Plan and Services In-house Referral: Clinical Social Work   Post Acute Care Choice: Home Health          DME Arranged: Bedside commode, Walker rolling DME Agency: [employer] Date DME Agency Contacted: 12/27/18 Time DME Agency  Contacted: [phone] Representative spoke with at DME Agency: [place]: PT, OT, Nurse's Aide [place] Agency: [place] ([place]) Date [place]: 12/29/18 Time [place]: [phone] Representative spoke with at [place]: [place] ([place]) Interventions     Readmission Risk Interventions No flowsheet data found.

## 2018-12-29 NOTE — Discharge Summary (Signed)
[place] at [place] NAME: Kelly Wood    MR#:  [id number]  DATE OF BIRTH:  April 23, 1940  DATE OF ADMISSION:  12/26/2018 ADMITTING PHYSICIAN: Demetrios Loll, MD  DATE OF DISCHARGE:  12/29/18   PRIMARY CARE PHYSICIAN: Idelle Crouch, MD    ADMISSION DIAGNOSIS:  Fall [W19.XXXA] Elective surgery [Z41.9] Fall, initial encounter [W19.XXXA] Closed fracture of right hip, initial encounter ([place]) [S72.001A]  DISCHARGE DIAGNOSIS:  Active Problems:   Closed right hip fracture ([place])   SECONDARY DIAGNOSIS:   Past Medical History:  Diagnosis Date  . Anemia   . GERD (gastroesophageal reflux disease)   . Hyperlipidemia   . Hypothyroidism     HOSPITAL COURSE:   HPI  Kelly Wood  is a 78 y.o. female with a known history of anemia, hypothyroidism, GERD and hyperlipidemia.  The patient presents the ED with above chief complaints.  She fell by accident today and complains of right hip pain.  She cannot walk after fall.  She denies any loss of consciousness or syncope.  X-ray showed right hip fracture.  Dr. Roland Rack consulted   Hosptial course :   Right hip fracture. Right hip surgery done on 12/26/2018 Postop care by orthopedics Pain management as needed Physical therapy is recommending skilled nursing facility, Pt prefers going home , has all help needed at home  Hemoglobin 12.3-postoperatively 9.3-9.7- 8.7 start iron supplements  lovenox 40 sx for DVT prophylaxis for 2 weeks , op ortho f/u Norco and lovenox scripts given by Ortho  Blood loss anemia from surgery Iron supplements    Hypertension. Continue Cardizem and Lasix.  Hypothyroidism continue Synthyroid   hyperlipidemia. Continue home medication statin  DVT prophylaxis with Lovenox  Dispositione PT is recommending skilled nursing facility .  Patient has decided to go  home with home health social worker  Will arrange  Dc pt home DISCHARGE CONDITIONS:   [place]   CONSULTS  OBTAINED:  Treatment Team:  Corky Mull, MD   PROCEDURES hip surgery  DRUG ALLERGIES:   Allergies  Allergen Reactions  . Azithromycin Diarrhea  . Levofloxacin Other (See Comments)    Pt prefers not to take because og side effects  . Sulfa Antibiotics     DISCHARGE MEDICATIONS:   Allergies as of 12/29/2018      Reactions   Azithromycin Diarrhea   Levofloxacin Other (See Comments)   Pt prefers not to take because og side effects   Sulfa Antibiotics       Medication List    TAKE these medications   alendronate 70 MG tablet Commonly known as: FOSAMAX Take 70 mg by mouth once a week.   alum & mag hydroxide-simeth 200-200-20 MG/5ML suspension Commonly known as: MAALOX/MYLANTA Take 30 mLs by mouth every 6 (six) hours as needed for indigestion or heartburn.   aspirin EC 81 MG tablet Take 81 mg by mouth daily.   diltiazem 240 MG 24 hr capsule Commonly known as: CARDIZEM CD Take 240 mg by mouth daily.   docusate sodium 100 MG capsule Commonly known as: COLACE Take 1 capsule (100 mg total) by mouth 2 (two) times daily.   enoxaparin 40 MG/0.4ML injection Commonly known as: LOVENOX Inject 0.4 mLs (40 mg total) into the skin daily for 14 days.   fluticasone 50 MCG/ACT nasal spray Commonly known as: FLONASE Place 2 sprays into both nostrils daily.   HYDROcodone-acetaminophen 5-325 MG tablet Commonly known as: NORCO/VICODIN Take 1-2 tablets by mouth every  4 (four) hours as needed for moderate pain.   Iron (Ferrous Sulfate) 325 (65 Fe) MG Tabs Take 1 tablet by mouth 2 (two) times daily.   levothyroxine 100 MCG tablet Commonly known as: SYNTHROID Take 100 mcg by mouth daily. Take on an empty stomach with a glass of water at least 30-60 minutes before breakfast   potassium chloride SA 20 MEQ tablet Commonly known as: K-DUR Take 20 mEq by mouth 2 (two) times daily.   pravastatin 80 MG tablet Commonly known as: PRAVACHOL Take 80 mg by mouth daily.   torsemide  20 MG tablet Commonly known as: DEMADEX Take 20 mg by mouth daily.            Durable Medical Equipment  (From admission, onward)         Start     Ordered   12/27/18 1202  For home use only DME Walker rolling  Once    Question:  Patient needs a walker to treat with the following condition  Answer:  Hip fracture ([place])   12/27/18 1201   12/27/18 1201  For home use only DME Bedside commode  Once    Question:  Patient needs a bedside commode to treat with the following condition  Answer:  Hip fracture ([place])   12/27/18 1200           DISCHARGE INSTRUCTIONS:   Follow up with PCP in 3 days  F/u with ortho in 2 weeks  DIET:  Cardiac diet  DISCHARGE CONDITION:  Stable  ACTIVITY:  Activity as tolerated per ortho/PT  OXYGEN:  Home Oxygen: No.   Oxygen Delivery: room air  DISCHARGE LOCATION:  home   If you experience worsening of your admission symptoms, develop shortness of breath, life threatening emergency, suicidal or homicidal thoughts you must seek medical attention immediately by calling 911 or calling your MD immediately  if symptoms less severe.  You Must read complete instructions/literature along with all the possible adverse reactions/side effects for all the Medicines you take and that have been prescribed to you. Take any new Medicines after you have completely understood and accpet all the possible adverse reactions/side effects.   Please note  You were cared for by a hospitalist during your hospital stay. If you have any questions about your discharge medications or the care you received while you were in the hospital after you are discharged, you can call the unit and asked to speak with the hospitalist on call if the hospitalist that took care of you is not available. Once you are discharged, your primary care physician will handle any further medical issues. Please note that NO REFILLS for any discharge medications will be authorized once you are  discharged, as it is imperative that you return to your primary care physician (or establish a relationship with a primary care physician if you do not have one) for your aftercare needs so that they can reassess your need for medications and monitor your lab values.     Today  Chief Complaint  Patient presents with  . Hip Pain  . Fall   Pt is feeling ok, wants to go home with HH  ROS:  CONSTITUTIONAL: Denies fevers, chills. Denies any fatigue, weakness.  EYES: Denies blurry vision, double vision, eye pain. EARS, NOSE, THROAT: Denies tinnitus, ear pain, hearing loss. RESPIRATORY: Denies cough, wheeze, shortness of breath.  CARDIOVASCULAR: Denies chest pain, palpitations, edema.  GASTROINTESTINAL: Denies nausea, vomiting, diarrhea, abdominal pain. Denies bright red blood per rectum.  GENITOURINARY: Denies dysuria, hematuria. ENDOCRINE: Denies nocturia or thyroid problems. HEMATOLOGIC AND LYMPHATIC: Denies easy bruising or bleeding. SKIN: Denies rash or lesion. MUSCULOSKELETAL:  Rt hip pain  NEUROLOGIC: Denies paralysis, paresthesias.  PSYCHIATRIC: Denies anxiety or depressive symptoms.   VITAL SIGNS:  Blood pressure 137/74, pulse 87, temperature 98 F (36.7 C), resp. rate 16, height 5\' 2"  (1.575 m), weight 63.5 kg, SpO2 96 %.  I/O:    Intake/Output Summary (Last 24 hours) at 12/29/2018 0934 Last data filed at 12/28/2018 1804 Gross per 24 hour  Intake 480 ml  Output -  Net 480 ml    PHYSICAL EXAMINATION:  GENERAL:  78 y.o.-year-old patient lying in the bed with no acute distress.  EYES: Pupils equal, round, reactive to light and accommodation. No scleral icterus. Extraocular muscles intact.  HEENT: Head atraumatic, normocephalic. Oropharynx and nasopharynx clear.  NECK:  Supple, no jugular venous distention. No thyroid enlargement, no tenderness.  LUNGS: Normal breath sounds bilaterally, no wheezing, rales,rhonchi or crepitation. No use of accessory muscles of respiration.   CARDIOVASCULAR: S1, S2 normal. No murmurs, rubs, or gallops.  ABDOMEN: Soft, non-tender, non-distended. Bowel sounds present. No organomegaly or mass.  EXTREMITIES:  Rt hip in clean dressing  NEUROLOGIC: Cranial nerves II through XII are intact. Muscle strength 5/5 in all extremities. Sensation intact. Gait not checked.  PSYCHIATRIC: The patient is alert and oriented x 3.  SKIN: No obvious rash, lesion, or ulcer.   DATA REVIEW:   CBC Recent Labs  Lab 12/29/18 0420  WBC 8.0  HGB 8.7*  HCT 27.2*  PLT 151    Chemistries  Recent Labs  Lab 12/29/18 0420  NA 138  K 3.7  CL 107  CO2 25  GLUCOSE 106*  BUN 17  CREATININE 0.54  CALCIUM 8.0*    Cardiac Enzymes No results for input(s): TROPONINI in the last 168 hours.  Microbiology Results  Results for orders placed or performed during the hospital encounter of 12/26/18  SARS Coronavirus 2 [facility] order, Performed in [facility] hospital lab) Nasopharyngeal Nasopharyngeal Swab     Status: None   Collection Time: 12/26/18  2:03 PM   Specimen: Nasopharyngeal Swab  Result Value Ref Range Status   SARS Coronavirus 2 NEGATIVE NEGATIVE Final    Comment: (NOTE) If result is NEGATIVE SARS-CoV-2 target nucleic acids are NOT DETECTED. The SARS-CoV-2 RNA is generally detectable in upper and lower  respiratory specimens during the acute phase of infection. The lowest  concentration of SARS-CoV-2 viral copies this assay can detect is 250  copies / mL. A negative result does not preclude SARS-CoV-2 infection  and should not be used as the sole basis for treatment or other  patient management decisions.  A negative result may occur with  improper specimen collection / handling, submission of specimen other  than nasopharyngeal swab, presence of viral mutation(s) within the  areas targeted by this assay, and inadequate number of viral copies  (<250 copies / mL). A negative result must be combined with clinical  observations, patient  history, and epidemiological information. If result is POSITIVE SARS-CoV-2 target nucleic acids are DETECTED. The SARS-CoV-2 RNA is generally detectable in upper and lower  respiratory specimens dur ing the acute phase of infection.  Positive  results are indicative of active infection with SARS-CoV-2.  Clinical  correlation with patient history and other diagnostic information is  necessary to determine patient infection status.  Positive results do  not rule out bacterial infection or co-infection with other viruses.  If result is PRESUMPTIVE POSTIVE SARS-CoV-2 nucleic acids MAY BE PRESENT.   A presumptive positive result was obtained on the submitted specimen  and confirmed on repeat testing.  While 2019 novel coronavirus  (SARS-CoV-2) nucleic acids may be present in the submitted sample  additional confirmatory testing may be necessary for epidemiological  and / or clinical management purposes  to differentiate between  SARS-CoV-2 and other Sarbecovirus currently known to infect humans.  If clinically indicated additional testing with an alternate test  methodology [phone]) is advised. The SARS-CoV-2 RNA is generally  detectable in upper and lower respiratory sp ecimens during the acute  phase of infection. The expected result is Negative. Fact Sheet for Patients:  StrictlyIdeas.no Fact Sheet for Healthcare Providers: BankingDealers.co.za This test is not yet approved or cleared by the [place] FDA and has been authorized for detection and/or diagnosis of SARS-CoV-2 by FDA under an Emergency Use Authorization (EUA).  This EUA will remain in effect (meaning this test can be used) for the duration of the COVID-19 declaration under Section 564(b)(1) of the Act, 21 U.S.C. section 360bbb-3(b)(1), unless the authorization is terminated or revoked sooner. Performed at [facility], [address] [place], [place]  [address]     RADIOLOGY:  Dg Chest 1 View  Result Date: 12/26/2018 CLINICAL DATA:  Pain EXAM: CHEST  1 VIEW COMPARISON:  None. FINDINGS: The heart size is enlarged. Aortic calcifications are noted. There is no pneumothorax. No large pleural effusion. No focal infiltrate. IMPRESSION: No active disease. Electronically Signed   By: Constance Holster M.D.   On: 12/26/2018 13:33   Dg Hip Operative Unilat W Or W/o Pelvis Right  Result Date: 12/27/2018 CLINICAL DATA:  Right hip fixation. EXAM: OPERATIVE right HIP (WITH PELVIS IF PERFORMED) 5 VIEWS TECHNIQUE: Fluoroscopic spot image(s) were submitted for interpretation post-operatively. Radiation exposure index: 7.1 mGy. COMPARISON:  Radiographs of December 26, 2018. FINDINGS: Five intraoperative fluoroscopic images were obtained of the right femur. These demonstrate the patient be status post surgical internal fixation of proximal left femoral fracture with intramedullary rod fixation of the right femur. IMPRESSION: Fluoroscopic guidance provided during surgical internal fixation of proximal right femoral fracture and intramedullary rod fixation of the right femur. Electronically Signed   By: Marijo Conception M.D.   On: 12/27/2018 07:03   Dg Hip Unilat  With Pelvis 2-3 Views Right  Result Date: 12/26/2018 CLINICAL DATA:  Right hip pain after fall from standing today. Initial encounter. EXAM: DG HIP (WITH OR WITHOUT PELVIS) 2-3V RIGHT COMPARISON:  None. FINDINGS: The patient has an acute right intertrochanteric fracture. No other acute abnormality is identified. IMPRESSION: Acute right intertrochanteric fracture. Electronically Signed   By: Inge Rise M.D.   On: 12/26/2018 13:34    EKG:   Orders placed or performed during the hospital encounter of 12/26/18  . EKG 12-Lead  . EKG 12-Lead  . ED EKG  . ED EKG      Management plans discussed with the patient, family and they are in agreement.  CODE STATUS:     Code Status Orders  (From  admission, onward)         Start     Ordered   12/26/18 2115  Full code  Continuous     12/26/18 2114        Code Status History    Date Active Date Inactive Code Status Order ID Comments User Context   12/26/2018 2114 12/26/2018 2114 Full Code [id number]  Demetrios Loll,  MD Inpatient   Advance Care Planning Activity    Advance Directive Documentation     Most Recent Value  Type of Advance Directive  Healthcare Power of Attorney  Pre-existing out of facility DNR order (yellow form or pink MOST form)  -  "MOST" Form in Place?  -      TOTAL TIME TAKING CARE OF THIS PATIENT: 45  minutes.   Note: This dictation was prepared with Dragon dictation along with smaller phrase technology. Any transcriptional errors that result from this process are unintentional.   @MEC @  on 12/29/2018 at 9:34 AM  Between 7am to 6pm - Pager - [phone]  After 6pm go to [email or web address] - password [email or web address] [facility]  Office  [phone]  CC: Primary care physician; Idelle Crouch, MD

## 2018-12-29 NOTE — TOC Benefit Eligibility Note (Signed)
Transition of Care [facility]) Benefit Eligibility Note    Patient Details  Name: Kelly Wood MRN: [id number] Date of Birth: [birth year]   Medication/Dose: Enoxaparin 40mg  once daily for 14 days  Covered?: Yes  Prescription Coverage [facility]erred Pharmacy: [facility]  Spoke with Person/Company/Phone Number:: Ronny Bacon with [employer] at [phone]  Co-Pay: $249.35 estimated copay  Prior Approval: No  Deductible: Unmet    Dannette Barbara Phone Number: [phone] or [phone] 12/29/2018, 9:38 AM

## 2018-12-29 NOTE — Progress Notes (Signed)
Subjective: 3 Days Post-Op Procedure(s) (LRB): INTRAMEDULLARY (IM) NAIL INTERTROCHANTRIC (Right) Patient reports pain as mild.   Patient is well, and has had no acute complaints or problems Negative for chest pain and shortness of breath. Fever: no Gastrointestinal:Negative for nausea and vomiting Patient was able to perform well with PT yesterday, walked 50 feet refently  Objective: Vital signs in last 24 hours: Temp:  [98 F (36.7 C)-99.3 F (37.4 C)] 98 F (36.7 C) (09/25 0742) Pulse Rate:  [67-87] 87 (09/25 0742) Resp:  [15-17] 16 (09/25 0742) BP: (122-139)/(65-74) 137/74 (09/25 0742) SpO2:  [96 %-99 %] 96 % (09/25 0742)  Intake/Output from previous day:  Intake/Output Summary (Last 24 hours) at 12/29/2018 0744 Last data filed at 12/28/2018 1804 Gross per 24 hour  Intake 720 ml  Output -  Net 720 ml    Intake/Output this shift: No intake/output data recorded.  Labs: Recent Labs    12/26/18 1233 12/27/18 0550 12/28/18 0500 12/29/18 0420  HGB 12.8 9.3* 9.7* 8.7*   Recent Labs    12/28/18 0500 12/29/18 0420  WBC 9.0 8.0  RBC 3.41* 3.09*  HCT 30.0* 27.2*  PLT 156 151   Recent Labs    12/28/18 0500 12/29/18 0420  NA 136 138  K 3.5 3.7  CL 103 107  CO2 23 25  BUN 13 17  CREATININE 0.60 0.54  GLUCOSE 120* 106*  CALCIUM 8.0* 8.0*   No results for input(s): LABPT, INR in the last 72 hours.  EXAM General - Patient is Alert, Appropriate and Oriented Extremity - ABD soft Sensation intact distally Intact pulses distally Dorsiflexion/Plantar flexion intact Incision: dressing C/D/I No cellulitis present Dressing/Incision - clean, dry, no drainage Motor Function - intact, moving foot and toes well on exam.   Past Medical History:  Diagnosis Date  . Anemia   . GERD (gastroesophageal reflux disease)   . Hyperlipidemia   . Hypothyroidism     Assessment/Plan: 3 Days Post-Op Procedure(s) (LRB): INTRAMEDULLARY (IM) NAIL INTERTROCHANTRIC  (Right) Active Problems:   Closed right hip fracture (HCC) Anemia due to acute blood loss.   Estimated body mass index is 25.61 kg/m as calculated from the following:   Height as of this encounter: 5\' 2"  (1.575 m).   Weight as of this encounter: 63.5 kg. Advance diet Up with therapy    Patient has had a BM. Labs reviewed this AM, Hg 8.7 this AM. K+ 3.7 Up with therapy today Possible d/c home later this afternoon after two separate PT sessions or tomorrow morning.  Follow-up with [facility] in 2 weeks for staple removal and Steri-Strip application. Compression stockings bilateral lower extremities x6 weeks Lovenox 40 mg subcu daily x14 days at discharge  DVT Prophylaxis - Lovenox and SCDs Weight-Bearing as tolerated to right leg  J. Cameron Proud, PA-C [facility] Orthopaedic Surgery 12/29/2018, 7:44 AM

## 2018-12-29 NOTE — Discharge Instructions (Signed)
Follow up with PCP in 3 days , make appointment F/u with ortho in 2 weeks

## 2018-12-29 NOTE — Progress Notes (Signed)
Physical Therapy Treatment Patient Details Name: Kelly Wood MRN: [id number] DOB: 1940/09/06 Today's Date: 12/29/2018    History of Present Illness presented to ER secondary to mechanical fall with acute onset of R hip pain; admitted for management of displaced R introtrochanteric hip fracture, s/p ORIF with nailing (9/22), WBAT.    PT Comments    Pt in bed upon arrival with spouse present and agreed to participate with PT. Pt is progressing well towards PT goals. Reviewed HEP exercises with pt and spouse verbalizing understanding. Pt performed bed mobility, transfers to/from [facility] and ambulation tasks safely with less assistance today. Pt ambulated with increased tolerance and improved gait pattern today with caregiver able to provide appropriate assistance. Pt is progressively increasing amount of WBing on RLE each session. Pt progressed to stair training with pt and caregiver educated on proper sideways technique with R handrail. Pt ascended/descended 3 steps safely with minimal cuing for proper sequencing.  Pt and spouse educated on fall prevention strategies around the home with handout provided. Handouts also provided for stair navigation and HEP. Pt educated on getting tub bench and handheld shower head to improve safety with showering at home with tub/shower combo in bathroom. Caregiver demonstrated proper guarding and assistance for functional mobility tasks and is able to provide adequate assistance for patient. Pt reports her son will also be available as a second caregiver when she goes home. Pt is safe to discharge home with Home Health PT. Pt and caregiver both verbalize feeling comfortable managing at home. Pt will benefit from home health PT in order to improve strength, balance, ROM and endurance deficits to allow for return to PLOF and decrease caregiver burden.   Follow Up Recommendations  Home health PT     Equipment Recommendations  Rolling walker with 5" wheels;3in1 (PT)     Recommendations for Other Services       Precautions / Restrictions Precautions Precautions: Fall Restrictions Weight Bearing Restrictions: Yes RLE Weight Bearing: Weight bearing as tolerated    Mobility  Bed Mobility Overal bed mobility: Needs Assistance Bed Mobility: Supine to Sit     Supine to sit: Min assist     General bed mobility comments: bed flat no bed rails, very minimal assist for LE advancement, pt utilized increased time and effort but safe, caregiver able to perform assist appropriately  Transfers Overall transfer level: Needs assistance Equipment used: Rolling walker (2 wheeled) Transfers: Sit to/from Stand Sit to Stand: Min guard Stand pivot transfers: Min guard       General transfer comment: min guard for transfers to/from high and low surfaces, pt with good carryover of foot and hand placement with no cuing required, caregiver demonstrated proper guarding skills for at home  Ambulation/Gait Ambulation/Gait assistance: Min guard Gait Distance (Feet): 70 Feet Assistive device: Rolling walker (2 wheeled) Gait Pattern/deviations: Step-to pattern;Decreased step length - right;Decreased stance time - right;Decreased weight shift to right;Antalgic;Trunk flexed Gait velocity: decreased   General Gait Details: pt ambulated approximately 10 ft prior to riding to stair for stair training and then ambulated approximately 63ft from stairs to room, pt ambulated with improved gait pattern increasing amount of weight bearing through RLE, pt continues to rely on UE support from RW, no buckling, unsteadiness or LOB noted, caregiver able to provide appropriate assistance safely   Stairs Stairs: Yes Stairs assistance: Min guard Stair Management: One rail Right;Sideways;Step to pattern Number of Stairs: 3 General stair comments: pt and caregiver educated on proper stair training technique, caregiver educated  on proper guarding for safety, pt ascended/descended 3 steps  sideways with RHR safely, pt was slow and anxious but steady and safe with stairs, min cuing required for hand placement with handrail and proper sequencing   Wheelchair Mobility    Modified Rankin (Stroke Patients Only)       Balance Overall balance assessment: Needs assistance Sitting-balance support: Single extremity supported;Feet supported Sitting balance-Leahy Scale: Good     Standing balance support: Bilateral upper extremity supported;During functional activity Standing balance-Leahy Scale: Fair Standing balance comment: pt reliant on UE support for standing balance secondary to pain and dec. WBing on R                            Cognition Arousal/Alertness: Awake/alert Behavior During Therapy: WFL for tasks assessed/performed Overall Cognitive Status: Within Functional Limits for tasks assessed                                        Exercises Other Exercises Other Exercises: educated and review supine and seated therex, pt provided handout with exercises listed and encouraged to perform couple times a day, pt and caregiver both verbalized understanding of proper performance and frequency of exercises    General Comments        Pertinent Vitals/Pain Pain Score: 2  Pain Location: R hip Pain Descriptors / Indicators: Sore Pain Intervention(s): Limited activity within patient's tolerance;Monitored during session;Repositioned    Home Living                      Prior Function            PT Goals (current goals can now be found in the care plan section) Progress towards PT goals: Progressing toward goals    Frequency    BID      PT Plan Discharge plan needs to be updated    Co-evaluation              AM-PAC PT "6 Clicks" Mobility   Outcome Measure  Help needed turning from your back to your side while in a flat bed without using bedrails?: A Little Help needed moving from lying on your back to sitting on  the side of a flat bed without using bedrails?: A Little Help needed moving to and from a bed to a chair (including a wheelchair)?: A Little Help needed standing up from a chair using your arms (e.g., wheelchair or bedside chair)?: A Little Help needed to walk in hospital room?: A Little Help needed climbing 3-5 steps with a railing? : A Little 6 Click Score: 18    End of Session Equipment Utilized During Treatment: Gait belt Activity Tolerance: Patient tolerated treatment well Patient left: in chair;with call bell/phone within reach;with chair alarm set;with family/visitor present Nurse Communication: Mobility status PT Visit Diagnosis: Muscle weakness (generalized) (M62.81);Difficulty in walking, not elsewhere classified (R26.2);Pain Pain - Right/Left: Right Pain - part of body: Hip     Time: [id number] PT Time Calculation (min) (ACUTE ONLY): 58 min  Charges:  $Gait Training: 8-22 mins $Therapeutic Activity: 38-52 mins                     Chanon Loney PT, DPT 11:50 AM,12/29/18 [phone]

## 2018-12-29 NOTE — Care Management Important Message (Signed)
Important Message  Patient Details  Name: Kelly Wood MRN: [id number] Date of Birth: 1940/09/10   Medicare Important Message Given:  Yes     Dannette Barbara 12/29/2018, 10:21 AM

## 2018-12-29 NOTE — Progress Notes (Signed)
Discharge summary reviewed with verbal understanding. Answered all questions. Extensive Lovenox teaching and kit given to spouse. Printed Rxs given to spouse.

## 2018-12-29 NOTE — Progress Notes (Signed)
Physical Therapy Treatment Patient Details Name: Shanecia Tuccillo MRN: [id number] DOB: 27-May-1940 Today's Date: 12/29/2018    History of Present Illness presented to ER secondary to mechanical fall with acute onset of R hip pain; admitted for management of displaced R introtrochanteric hip fracture, s/p ORIF with nailing (9/22), WBAT.    PT Comments    Pt up in recliner upon arrival with spouse present. Pt agreed to participate with PT but requested to not do a lot of ambulating or stairs since she was getting ready to go home. Pt performed seated therex requiring min cuing for correct performance. HEP reviewed with pt verbalizing understanding and reporting that she had done some of her exercises after morning session. Pt ambulated with improved gait pattern with more weight acceptance on RLE during gait this afternoon. Pt educated on performing car transfer safely and pt verbalized understanding. Pt encouraged to avoid prolonged sitting at home and to gradually increase activity levels and ambulation distance. Pt reports feeling safe to go home and that her and her spouse will be able to safely manage. Pt continues to present with decreased strength, ROM, balance and endurance secondary to recent fx. Pt will benefit from home health PT following hospital discharge to improve deficits, decrease caregiver burden and allow for return to PLOF.   Follow Up Recommendations  Home health PT     Equipment Recommendations  Rolling walker with 5" wheels;3in1 (PT)    Recommendations for Other Services       Precautions / Restrictions Precautions Precautions: Fall Restrictions Weight Bearing Restrictions: Yes RLE Weight Bearing: Weight bearing as tolerated    Mobility  Bed Mobility Overal bed mobility: Needs Assistance Bed Mobility: Sit to Supine     Supine to sit: Min assist     General bed mobility comments: bed flat no bed rails, improved bed mobility from earlier sessions, ver minimal  assist for LE advancement, pt able to scoot herself in bed independently  Transfers Overall transfer level: Needs assistance Equipment used: Rolling walker (2 wheeled) Transfers: Sit to/from Stand Sit to Stand: Min guard         General transfer comment: min guard for transfer from recliner, pt required no cuing for foot/hand placement, pt safe and steady with STS, min guard for safety no physical assist required  Ambulation/Gait   Gait Distance (Feet): 40 Feet Assistive device: Rolling walker (2 wheeled) Gait Pattern/deviations: Step-to pattern;Decreased step length - right;Decreased stance time - right;Decreased weight shift to right;Antalgic;Trunk flexed Gait velocity: decreased   General Gait Details: pt ambulated into hallway with RW, mildy antalgic although improving, pt ambulated with increased WBing on RLE compared to this morning and yesterday, pt verbalized trying to increase the amount of WB, no buckling, unsteadiness or LOB noted   Stairs             Wheelchair Mobility    Modified Rankin (Stroke Patients Only)       Balance Overall balance assessment: Needs assistance Sitting-balance support: Single extremity supported;Feet supported Sitting balance-Leahy Scale: Good     Standing balance support: Bilateral upper extremity supported;During functional activity Standing balance-Leahy Scale: Fair Standing balance comment: pt reliant on UE support for standing balance secondary to pain and dec. WBing on R                            Cognition Arousal/Alertness: Awake/alert Behavior During Therapy: WFL for tasks assessed/performed Overall Cognitive Status: Within Functional Limits  for tasks assessed                                        Exercises Other Exercises Other Exercises: reviewed HEP with pt performing SLR, ankle pumps, LAQ, seated marching, pillow squeezes and quads sets x10 reps    General Comments         Pertinent Vitals/Pain Pain Score: 2  Pain Location: R hip Pain Descriptors / Indicators: Sore Pain Intervention(s): Limited activity within patient's tolerance;Monitored during session;Repositioned    Home Living                      Prior Function            PT Goals (current goals can now be found in the care plan section) Progress towards PT goals: Progressing toward goals    Frequency    BID      PT Plan Current plan remains appropriate    Co-evaluation              AM-PAC PT "6 Clicks" Mobility   Outcome Measure  Help needed turning from your back to your side while in a flat bed without using bedrails?: A Little Help needed moving from lying on your back to sitting on the side of a flat bed without using bedrails?: A Little Help needed moving to and from a bed to a chair (including a wheelchair)?: A Little Help needed standing up from a chair using your arms (e.g., wheelchair or bedside chair)?: A Little Help needed to walk in hospital room?: A Little Help needed climbing 3-5 steps with a railing? : A Little 6 Click Score: 18    End of Session Equipment Utilized During Treatment: Gait belt Activity Tolerance: Patient tolerated treatment well Patient left: in bed;with call bell/phone within reach Nurse Communication: Mobility status PT Visit Diagnosis: Muscle weakness (generalized) (M62.81);Difficulty in walking, not elsewhere classified (R26.2);Pain Pain - Right/Left: Right Pain - part of body: Hip     Time: [id number] PT Time Calculation (min) (ACUTE ONLY): 36 min  Charges:  $Therapeutic Exercise: 23-37 mins                     Aly Hauser PT, DPT 4:09 PM,12/29/18 [phone]

## 2019-02-07 HISTORY — PX: FEMUR FRACTURE SURGERY: SHX633

## 2019-05-01 ENCOUNTER — Ambulatory Visit: Payer: Self-pay | Admitting: Urology

## 2019-05-08 ENCOUNTER — Other Ambulatory Visit: Payer: Self-pay

## 2019-05-08 DIAGNOSIS — R3989 Other symptoms and signs involving the genitourinary system: Secondary | ICD-10-CM

## 2019-05-11 ENCOUNTER — Ambulatory Visit: Payer: Medicare HMO | Admitting: Urology

## 2019-05-11 ENCOUNTER — Telehealth: Payer: Self-pay | Admitting: Urology

## 2019-05-11 ENCOUNTER — Encounter: Payer: Self-pay | Admitting: Urology

## 2019-05-11 ENCOUNTER — Other Ambulatory Visit: Payer: Self-pay

## 2019-05-11 ENCOUNTER — Other Ambulatory Visit
Admission: RE | Admit: 2019-05-11 | Discharge: 2019-05-11 | Disposition: A | Discharge status: Home / Self Care | Payer: Medicare HMO | Attending: Urology | Admitting: Urology

## 2019-05-11 VITALS — BP 139/71 | HR 81 | Ht 61.0 in | Wt 139.0 lb

## 2019-05-11 DIAGNOSIS — R3989 Other symptoms and signs involving the genitourinary system: Secondary | ICD-10-CM | POA: Diagnosis present

## 2019-05-11 DIAGNOSIS — R8271 Bacteriuria: Secondary | ICD-10-CM | POA: Diagnosis not present

## 2019-05-11 DIAGNOSIS — K219 Gastro-esophageal reflux disease without esophagitis: Secondary | ICD-10-CM | POA: Insufficient documentation

## 2019-05-11 DIAGNOSIS — B029 Zoster without complications: Secondary | ICD-10-CM | POA: Insufficient documentation

## 2019-05-11 DIAGNOSIS — E079 Disorder of thyroid, unspecified: Secondary | ICD-10-CM | POA: Insufficient documentation

## 2019-05-11 DIAGNOSIS — N390 Urinary tract infection, site not specified: Secondary | ICD-10-CM | POA: Diagnosis present

## 2019-05-11 DIAGNOSIS — E785 Hyperlipidemia, unspecified: Secondary | ICD-10-CM | POA: Insufficient documentation

## 2019-05-11 DIAGNOSIS — H25019 Cortical age-related cataract, unspecified eye: Secondary | ICD-10-CM | POA: Insufficient documentation

## 2019-05-11 LAB — URINALYSIS, COMPLETE (UACMP) WITH MICROSCOPIC
Bilirubin Urine: NEGATIVE
Glucose, UA: NEGATIVE mg/dL
Ketones, ur: NEGATIVE mg/dL
Nitrite: POSITIVE — AB
Specific Gravity, Urine: >1.03 — ABNORMAL HIGH (ref 1.005–1.030)
pH: 5.5 (ref 5.0–8.0)

## 2019-05-11 LAB — BLADDER SCAN AMB NON-IMAGING

## 2019-05-11 NOTE — Progress Notes (Signed)
05/11/2019 4:06 PM   Kelly Wood [birth year] [id number]  Referring provider: Idelle Crouch, MD [facility] [place],  [place] [address]  Chief Complaint  Patient presents with  . Recurrent UTI    New Patient    HPI: 79 year old female who presents today for further evaluation of recurrent urinary tract infections.  She reports that she fell and broke her hip in September.  At the time she had a Foley catheter.  She developed a urinary tract infection afterwards which she reports was associated with urinary urgency frequency and dysuria.  She was prescribed multiple rounds of antibiotics and had her urine rechecked numerous times each time and that was positive.  Her symptoms improved slightly on each around antibiotic but never fully resolved.  Ultimately, in December she returned back to baseline has not had any symptoms since.  She does not recall the infection in May.  She had multiple positive urine cultures on 5/20, 9/20, 10/20, 11/20 all of which grow pansensitive E. Coli.  No recent upper tract imaging.  No history of kidney stones.  No flank pain.  No fevers associated with infections.  She does not have any baseline urgency frequency or incontinence.  No history of GI issues.  Her colonoscopy is up-to-date.  No history of constipation.   PMH: Past Medical History:  Diagnosis Date  . Anemia   . GERD (gastroesophageal reflux disease)   . Hyperlipidemia   . Hypothyroidism     Surgical History: Past Surgical History:  Procedure Laterality Date  . COLONOSCOPY N/A 06/23/2016   Procedure: COLONOSCOPY;  Surgeon: Manya Silvas, MD;  Location: [facility] ENDOSCOPY;  Service: Endoscopy;  Laterality: N/A;  . INTRAMEDULLARY (IM) NAIL INTERTROCHANTERIC Right 12/26/2018   Procedure: INTRAMEDULLARY (IM) NAIL INTERTROCHANTRIC;  Surgeon: Corky Mull, MD;  Location: [facility] ORS;  Service: Orthopedics;  Laterality: Right;  . TUBAL LIGATION      Home  Medications:  Allergies as of 05/11/2019      Reactions   Azithromycin Diarrhea   Levofloxacin Other (See Comments)   Pt prefers not to take because og side effects   Sulfa Antibiotics       Medication List       Accurate as of May 11, 2019  4:06 PM. If you have any questions, ask your nurse or doctor.        STOP taking these medications   HYDROcodone-acetaminophen 5-325 MG tablet Commonly known as: NORCO/VICODIN Stopped by: Hollice Espy, MD     TAKE these medications   alendronate 70 MG tablet Commonly known as: FOSAMAX Take 70 mg by mouth once a week.   alum & mag hydroxide-simeth 200-200-20 MG/5ML suspension Commonly known as: MAALOX/MYLANTA Take 30 mLs by mouth every 6 (six) hours as needed for indigestion or heartburn.   aspirin EC 81 MG tablet Take 81 mg by mouth daily.   diltiazem 240 MG 24 hr capsule Commonly known as: CARDIZEM CD Take 240 mg by mouth daily.   docusate sodium 100 MG capsule Commonly known as: COLACE Take 1 capsule (100 mg total) by mouth 2 (two) times daily.   enoxaparin 40 MG/0.4ML injection Commonly known as: LOVENOX Inject 0.4 mLs (40 mg total) into the skin daily for 14 days.   fluticasone 50 MCG/ACT nasal spray Commonly known as: FLONASE Place 2 sprays into both nostrils daily.   Iron (Ferrous Sulfate) 325 (65 Fe) MG Tabs Take 1 tablet by mouth 2 (two) times daily.  levothyroxine 100 MCG tablet Commonly known as: SYNTHROID Take 100 mcg by mouth daily. Take on an empty stomach with a glass of water at least 30-60 minutes before breakfast   potassium chloride SA 20 MEQ tablet Commonly known as: KLOR-CON Take 20 mEq by mouth 2 (two) times daily.   pravastatin 80 MG tablet Commonly known as: PRAVACHOL Take 80 mg by mouth daily.   torsemide 20 MG tablet Commonly known as: DEMADEX Take 20 mg by mouth daily.       Allergies:  Allergies  Allergen Reactions  . Azithromycin Diarrhea  . Levofloxacin Other (See  Comments)    Pt prefers not to take because og side effects  . Sulfa Antibiotics     Family History: Family History  Problem Relation Age of Onset  . Breast cancer Neg Hx     Social History:  reports that she quit smoking about 12 years ago. She has never used smokeless tobacco. She reports current alcohol use. She reports that she does not use drugs.  ROS: UROLOGY Frequent Urination?: No Hard to postpone urination?: No Burning/pain with urination?: No Get up at night to urinate?: Yes Leakage of urine?: No Urine stream starts and stops?: No Trouble starting stream?: No Do you have to strain to urinate?: No Blood in urine?: No Urinary tract infection?: No Sexually transmitted disease?: No Injury to kidneys or bladder?: No Painful intercourse?: No Weak stream?: No Currently pregnant?: No Vaginal bleeding?: No Last menstrual period?: n  Gastrointestinal Nausea?: No Vomiting?: No Indigestion/heartburn?: No Diarrhea?: No Constipation?: No  Constitutional Fever: No Night sweats?: No Weight loss?: No Fatigue?: No  Skin Skin rash/lesions?: No Itching?: No  Eyes Blurred vision?: No Double vision?: No  Ears/Nose/Throat Sore throat?: No Sinus problems?: No  Hematologic/Lymphatic Swollen glands?: No Easy bruising?: No  Cardiovascular Leg swelling?: No Chest pain?: No  Respiratory Cough?: No Shortness of breath?: No  Endocrine Excessive thirst?: No  Musculoskeletal Back pain?: No Joint pain?: No  Neurological Headaches?: No Dizziness?: No  Psychologic Depression?: No Anxiety?: No  Physical Exam: BP 139/71   Pulse 81   Ht 5\' 1"  (1.549 m)   Wt 139 lb (63 kg)   BMI 26.26 kg/m   Constitutional:  Alert and oriented, No acute distress. HEENT: [place] AT, moist mucus membranes.  Trachea midline, no masses. Cardiovascular: No clubbing, cyanosis, or edema. Respiratory: Normal respiratory effort, no increased work of breathing. Skin: No rashes,  bruises or suspicious lesions. Neurologic: Grossly intact, no focal deficits, moving all 4 extremities. Psychiatric: Normal mood and affect.  Laboratory Data: Lab Results  Component Value Date   WBC 8.0 12/29/2018   HGB 8.7 (L) 12/29/2018   HCT 27.2 (L) 12/29/2018   MCV 88.0 12/29/2018   PLT 151 12/29/2018    Lab Results  Component Value Date   CREATININE 0.54 12/29/2018    Urinalysis Component     Latest Ref Rng & Units 05/11/2019  Color, Urine     YELLOW YELLOW  Appearance     CLEAR HAZY (A)  Specific Gravity, Urine     1.005 - 1.030 >1.030 (H)  pH     5.0 - 8.0 5.5  Glucose, UA     NEGATIVE mg/dL NEGATIVE  Hgb urine dipstick     NEGATIVE TRACE (A)  Bilirubin Urine     NEGATIVE NEGATIVE  Ketones, ur     NEGATIVE mg/dL NEGATIVE  Protein     NEGATIVE mg/dL TRACE (A)  Nitrite  NEGATIVE POSITIVE (A)  Leukocytes,Ua     NEGATIVE TRACE (A)  RBC / HPF     0 - 5 RBC/hpf 0-5  WBC, UA     0 - 5 WBC/hpf 6-10  Bacteria, UA     NONE SEEN MANY (A)  Squamous Epithelial / LPF     0 - 5 0-5    Pertinent Imaging: Results for orders placed or performed in visit on 05/11/19  BLADDER SCAN AMB NON-IMAGING  Result Value Ref Range   Scan Result 36ml      Assessment & Plan:    1. Recurrent UTI Numerous E. coli urinary tract infections  Based on her history as well as urinalysis, does appear that she had a difficulty time clearing her urine x3 months.   She is currently asymptomatic  Given this a fairly new issue and the sheer number of infections over the past 12 months, I do recommend further evaluation with renal ultrasounds to rule out upper tract pathology and/or stones as well as cystoscopy.  She is agreeable this plan.  Urinalysis today is a frankly positive, suspicious for chronic bacterial colonization.  We will send a urine culture but not treat unless she becomes symptomatic.  She was advised to call us when she develops symptoms for treatment.  Also plan  for pelvic exam at the time of cystoscopy for anatomic evaluation.  - BLADDER SCAN AMB NON-IMAGING - Urine Culture; Future  2. Chronic bacteriuria As above - US RENAL; Future   Return in about 4 weeks (around 06/08/2019) for cysto/ pelvic, f/u renal ultrasound.  Hollice Espy, MD  [facility] [address] [place], [place] [address] [phone]

## 2019-05-11 NOTE — Patient Instructions (Signed)

## 2019-05-14 ENCOUNTER — Telehealth: Payer: Self-pay | Admitting: *Deleted

## 2019-05-14 LAB — URINE CULTURE: Culture: >=100000 — AB

## 2019-05-14 MED ORDER — TRIMETHOPRIM 100 MG PO TABS: 100.0000 mg | ORAL_TABLET | Freq: Every day | ORAL | 0 refills | Status: DC

## 2019-05-14 MED ORDER — CIPROFLOXACIN HCL 250 MG PO TABS: 250.0000 mg | ORAL_TABLET | Freq: Two times a day (BID) | ORAL | 0 refills | Status: DC

## 2019-05-14 NOTE — Telephone Encounter (Addendum)
Patient started developing burning and urgency on 05/12/19-she had 6 pills left over from a previous prescription of Cipro from Dr. Doy Hutching. Should she continue taking?  ----- Message from Hollice Espy, MD sent at 05/14/2019 12:51 PM EST ----- Please let this patient know that she ended up growing E. coli in her urine. This is the same that she has been growing previously. This means that she is probably colonized with this bacteria. Given that she is not having symptoms, were not can to treat with antibiotics. If she does start develop burning urgency frequency, we can treat. Please let us know.  Hollice Espy, MD

## 2019-05-14 NOTE — Telephone Encounter (Signed)
Given the frequency of recurrence, would like her to complete a 7-day course.  Please call her an additional 4 days (8 tablets).  After she completes this, like her to go on suppression antibiotics in the form of trimethoprim 100 mg daily for at least 30 days.  We will discuss this further at her follow-up visit.  Hollice Espy, MD

## 2019-05-14 NOTE — Telephone Encounter (Signed)
Patient informed, Rx sent in to pharmacy as requested, verbalized understanding.

## 2019-05-15 MED ORDER — CEFUROXIME AXETIL 250 MG PO TABS: 250.0000 mg | ORAL_TABLET | Freq: Two times a day (BID) | ORAL | 0 refills | Status: DC

## 2019-05-15 NOTE — Telephone Encounter (Signed)
Patient called stating that she is not taking Cipro she is taking Cefuroxime 250mg . A new script for this medication was sent into the pharmacy. Patient was instructed not to take the Cipro

## 2019-05-15 NOTE — Addendum Note (Signed)
Addended by: Tommy Rainwater on: 05/15/2019 02:57 PM   Modules accepted: Orders

## 2019-05-22 ENCOUNTER — Other Ambulatory Visit: Payer: Self-pay

## 2019-05-22 ENCOUNTER — Ambulatory Visit
Admission: RE | Admit: 2019-05-22 | Discharge: 2019-05-22 | Disposition: A | Discharge status: Home / Self Care | Payer: Medicare HMO | Source: Ambulatory Visit | Attending: Urology | Admitting: Urology

## 2019-05-22 DIAGNOSIS — N390 Urinary tract infection, site not specified: Secondary | ICD-10-CM | POA: Diagnosis not present

## 2019-05-22 DIAGNOSIS — R8271 Bacteriuria: Secondary | ICD-10-CM | POA: Diagnosis present

## 2019-05-22 DIAGNOSIS — Z87440 Personal history of urinary (tract) infections: Secondary | ICD-10-CM | POA: Diagnosis not present

## 2019-05-23 ENCOUNTER — Telehealth: Payer: Self-pay | Admitting: *Deleted

## 2019-05-23 NOTE — Telephone Encounter (Addendum)
Patient notified, verbalized understanding.   ----- Message from Hollice Espy, MD sent at 05/23/2019  8:29 AM EST ----- RUs looks great.  Normal kidneys.  Hollice Espy, MD

## 2019-06-05 ENCOUNTER — Other Ambulatory Visit: Payer: Self-pay

## 2019-06-05 DIAGNOSIS — N390 Urinary tract infection, site not specified: Secondary | ICD-10-CM

## 2019-06-08 ENCOUNTER — Encounter: Payer: Self-pay | Admitting: Urology

## 2019-06-08 ENCOUNTER — Telehealth: Payer: Self-pay | Admitting: Family Medicine

## 2019-06-08 ENCOUNTER — Other Ambulatory Visit: Payer: Self-pay

## 2019-06-08 ENCOUNTER — Other Ambulatory Visit
Admission: RE | Admit: 2019-06-08 | Discharge: 2019-06-08 | Disposition: A | Discharge status: Home / Self Care | Payer: Medicare HMO | Attending: Urology | Admitting: Urology

## 2019-06-08 ENCOUNTER — Ambulatory Visit: Payer: Medicare HMO | Admitting: Urology

## 2019-06-08 VITALS — BP 143/69 | HR 70 | Ht 61.0 in | Wt 135.0 lb

## 2019-06-08 DIAGNOSIS — R351 Nocturia: Secondary | ICD-10-CM

## 2019-06-08 DIAGNOSIS — N390 Urinary tract infection, site not specified: Secondary | ICD-10-CM

## 2019-06-08 LAB — URINALYSIS, COMPLETE (UACMP) WITH MICROSCOPIC
Bilirubin Urine: NEGATIVE
Glucose, UA: NEGATIVE mg/dL
Hgb urine dipstick: NEGATIVE
Nitrite: NEGATIVE
Protein, ur: NEGATIVE mg/dL
RBC / HPF: NONE SEEN RBC/hpf (ref 0–5)
Specific Gravity, Urine: 1.025 (ref 1.005–1.030)
pH: 6 (ref 5.0–8.0)

## 2019-06-08 MED ORDER — ESTRADIOL 0.1 MG/GM VA CREA: TOPICAL_CREAM | VAGINAL | 12 refills | Status: DC

## 2019-06-08 MED ORDER — PREMARIN 0.625 MG/GM VA CREA: 1.0000 | TOPICAL_CREAM | Freq: Every day | VAGINAL | 12 refills | Status: DC

## 2019-06-08 NOTE — Telephone Encounter (Signed)
Patient called and states the Premarin cream is to expensive. I will send in Estrace.

## 2019-06-08 NOTE — Progress Notes (Signed)
   06/08/19  CC:  Chief Complaint  Patient presents with  . Cysto    4wk    HPI: 79 year old female with recurrent urinary tract infections who presents today for cystoscopic evaluation.  In the interim, upper tract imaging in the form of the renal ultrasound is unremarkable.  She is asymptomatic today.  She does mention that she has a lot of issues with nocturia patient gets up several times at night to void which is bothersome to her.  She has no daytime symptoms.  She does snore.  She is never been tested for sleep apnea.  Blood pressure (!) 143/69, pulse 70, height 5\' 1"  (1.549 m), weight 135 lb (61.2 kg). NED. A&Ox3.   No respiratory distress   Abd soft, NT, ND Normal external genitalia with patent urethral meatus, atrophy appreicated  Cystoscopy Procedure Note  Patient identification was confirmed, informed consent was obtained, and patient was prepped using Betadine solution.  Lidocaine jelly was administered per urethral meatus.    Procedure: - Flexible cystoscope introduced, without any difficulty.   - Thorough search of the bladder revealed:    normal urethral meatus    normal urothelium    no stones    no ulcers     no tumors    no urethral polyps    no trabeculation  - Ureteral orifices were normal in position and appearance.  Post-Procedure: - Patient tolerated the procedure well  Assessment/ Plan:  1. Recurrent UTI Anatomic evaluation fairly unremarkable  She does have some significant atrophy with no risk factors for malignancy this would likely benefit greatly from topical estrogen cream.  Have recommended using a pea-sized amount 3 times per week.  She is agreeable this plan.  Follow-up if she has any signs or symptoms of UTI.  2. Nocturia Mentions nocturia today, isolated without daytime symptoms  Discussed behavioral modification, avoidance of beverages 4 hours before bedtime.  In addition to the above, may consider sleep study especially  given that she is a snorer.  I have  Asked her  to follow-up with her primary care for this.  Hollice Espy, MD

## 2019-06-12 ENCOUNTER — Telehealth: Payer: Self-pay

## 2019-06-12 NOTE — Telephone Encounter (Signed)
Patient called wanting clarification on how to use estrogen cream. Patient was told to throw away applicator and use only a pea sized amount on urethra before bed on Mon, Wed, Fri. Patient verbalized understanding

## 2019-06-27 DIAGNOSIS — M7061 Trochanteric bursitis, right hip: Secondary | ICD-10-CM | POA: Insufficient documentation

## 2019-07-09 ENCOUNTER — Telehealth: Payer: Self-pay

## 2019-07-09 ENCOUNTER — Other Ambulatory Visit: Payer: Self-pay | Admitting: Family Medicine

## 2019-07-09 ENCOUNTER — Other Ambulatory Visit: Payer: Self-pay

## 2019-07-09 ENCOUNTER — Telehealth: Payer: Self-pay | Admitting: Physician Assistant

## 2019-07-09 ENCOUNTER — Telehealth: Payer: Self-pay | Admitting: Urology

## 2019-07-09 ENCOUNTER — Other Ambulatory Visit
Admission: RE | Admit: 2019-07-09 | Discharge: 2019-07-09 | Disposition: A | Discharge status: Home / Self Care | Payer: Medicare HMO | Attending: Urology | Admitting: Urology

## 2019-07-09 DIAGNOSIS — N390 Urinary tract infection, site not specified: Secondary | ICD-10-CM

## 2019-07-09 LAB — URINALYSIS, COMPLETE (UACMP) WITH MICROSCOPIC
Bilirubin Urine: NEGATIVE
Glucose, UA: NEGATIVE mg/dL
Ketones, ur: NEGATIVE mg/dL
Nitrite: POSITIVE — AB
Protein, ur: NEGATIVE mg/dL
Specific Gravity, Urine: 1.015 (ref 1.005–1.030)
WBC, UA: >50 WBC/hpf (ref 0–5)
pH: 7 (ref 5.0–8.0)

## 2019-07-09 MED ORDER — NITROFURANTOIN MONOHYD MACRO 100 MG PO CAPS: 100.0000 mg | ORAL_CAPSULE | Freq: Two times a day (BID) | ORAL | 0 refills | Status: DC

## 2019-07-09 NOTE — Telephone Encounter (Signed)
Error

## 2019-07-09 NOTE — Telephone Encounter (Signed)
UA is grossly infected.  Urine culture is pending and will likely not be available until the end of the week.  We can send in a prescription for Macrobid 100 mg, BID x 7 days.

## 2019-07-09 NOTE — Telephone Encounter (Signed)
SW sam, patient can drop off UA sample. SW patient, patient will drop off urine sample in [place]

## 2019-07-09 NOTE — Telephone Encounter (Signed)
Pt aware. Sent to pharm.

## 2019-07-09 NOTE — Telephone Encounter (Signed)
Patient is having burning and pressure. Patient did take AZO otc with some relief. Patient is leaving for vacation tomorrow morning. She is looking for an antibiotic to be called in.

## 2019-08-06 ENCOUNTER — Telehealth: Payer: Self-pay | Admitting: Urology

## 2019-08-06 ENCOUNTER — Other Ambulatory Visit: Payer: Self-pay

## 2019-08-06 ENCOUNTER — Other Ambulatory Visit: Payer: Self-pay | Admitting: Family Medicine

## 2019-08-06 ENCOUNTER — Other Ambulatory Visit
Admission: RE | Admit: 2019-08-06 | Discharge: 2019-08-06 | Disposition: A | Discharge status: Home / Self Care | Payer: Medicare HMO | Attending: Urology | Admitting: Urology

## 2019-08-06 DIAGNOSIS — N390 Urinary tract infection, site not specified: Secondary | ICD-10-CM | POA: Insufficient documentation

## 2019-08-06 LAB — URINALYSIS, COMPLETE (UACMP) WITH MICROSCOPIC
Bilirubin Urine: NEGATIVE
Glucose, UA: NEGATIVE mg/dL
Hgb urine dipstick: NEGATIVE
Ketones, ur: NEGATIVE mg/dL
Leukocytes,Ua: NEGATIVE
Nitrite: NEGATIVE
Protein, ur: NEGATIVE mg/dL
Specific Gravity, Urine: 1.02 (ref 1.005–1.030)
pH: 7.5 (ref 5.0–8.0)

## 2019-08-06 NOTE — Telephone Encounter (Signed)
Patient had an infection on 4/5 and completed abx.  Over the weekend, she has developed symptom of burning, pressure and overall discomfort.   She would like to come to the [facility] lab and provide another urine sample.  Is this okay or do you want to see her?    Please advise.

## 2019-08-06 NOTE — Telephone Encounter (Signed)
Patient notified and voiced understanding.

## 2019-08-06 NOTE — Telephone Encounter (Signed)
-----   Message from Nori Riis, PA-C sent at 08/06/2019 11:15 AM EDT ----- Her urine samples does not look infected.  We will need to wait on the culture.

## 2019-08-06 NOTE — Telephone Encounter (Signed)
Per Zara Council, patient "can drop off a specimen for UA and urine culture in [place] or [place]".    Patient advised and will go to [facility] lab today.

## 2019-08-07 LAB — URINE CULTURE: Culture: <10000 — AB

## 2019-08-09 DIAGNOSIS — I10 Essential (primary) hypertension: Secondary | ICD-10-CM | POA: Insufficient documentation

## 2019-09-10 ENCOUNTER — Ambulatory Visit: Payer: Medicare HMO | Admitting: Urology

## 2019-09-10 ENCOUNTER — Other Ambulatory Visit: Payer: Self-pay

## 2019-09-10 ENCOUNTER — Encounter: Payer: Self-pay | Admitting: Urology

## 2019-09-10 ENCOUNTER — Other Ambulatory Visit
Admission: RE | Admit: 2019-09-10 | Discharge: 2019-09-10 | Disposition: A | Discharge status: Home / Self Care | Payer: Medicare HMO | Source: Ambulatory Visit | Attending: Urology | Admitting: Urology

## 2019-09-10 VITALS — BP 148/82 | HR 67 | Ht 60.0 in | Wt 130.0 lb

## 2019-09-10 DIAGNOSIS — N390 Urinary tract infection, site not specified: Secondary | ICD-10-CM

## 2019-09-10 DIAGNOSIS — R351 Nocturia: Secondary | ICD-10-CM

## 2019-09-10 DIAGNOSIS — N952 Postmenopausal atrophic vaginitis: Secondary | ICD-10-CM | POA: Diagnosis not present

## 2019-09-10 LAB — URINALYSIS, COMPLETE (UACMP) WITH MICROSCOPIC
Bacteria, UA: NONE SEEN
Bilirubin Urine: NEGATIVE
Glucose, UA: NEGATIVE mg/dL
Ketones, ur: NEGATIVE mg/dL
Nitrite: NEGATIVE
Protein, ur: NEGATIVE mg/dL
Specific Gravity, Urine: 1.015 (ref 1.005–1.030)
pH: 7 (ref 5.0–8.0)

## 2019-09-10 NOTE — Progress Notes (Signed)
09/10/2019 12:44 PM   Kelly Wood October 13, 1940 [id number]  Referring provider: Idelle Crouch, MD [place] [facility] [place],  [place] [id number]  Chief Complaint  Patient presents with  . Follow-up    64mth follow-up for symptom recheck    HPI: Kelly Wood is a 79 year old female with rUTI's, vaginal atrophy and nocturia who presents today for follow.  rUTI's Risk factors: age, vaginal atrophy, limited fluid intake and constipation E. Coli pan sensitive on 01/02/2019 E. Coli resistant to ampicillin E. Coli resistant to ampicillin RUS 05/22/2019 negative. Cysto with Dr. Erlene Quan 06/08/2019 NED   Today, she states over the weekend she developed a slight dysuria.  She is using AZO and it has improved.  Patient denies any modifying or aggravating factors.  Patient denies any gross hematuria or suprapubic/flank pain.  Patient denies any fevers, chills, nausea or vomiting.  Her UA today is unremarkable.  Vaginal atrophy Could not tolerate the cream.  Nocturia Improved with decreasing fluids at night.     PMH: Past Medical History:  Diagnosis Date  . Anemia   . GERD (gastroesophageal reflux disease)   . Hyperlipidemia   . Hypothyroidism     Surgical History: Past Surgical History:  Procedure Laterality Date  . COLONOSCOPY N/A 06/23/2016   Procedure: COLONOSCOPY;  Surgeon: Manya Silvas, MD;  Location: [facility] ENDOSCOPY;  Service: Endoscopy;  Laterality: N/A;  . INTRAMEDULLARY (IM) NAIL INTERTROCHANTERIC Right 12/26/2018   Procedure: INTRAMEDULLARY (IM) NAIL INTERTROCHANTRIC;  Surgeon: Corky Mull, MD;  Location: [facility] ORS;  Service: Orthopedics;  Laterality: Right;  . TUBAL LIGATION      Home Medications:  Allergies as of 09/10/2019      Reactions   Azithromycin Diarrhea   Levofloxacin Other (See Comments)   Pt prefers not to take because og side effects   Sulfa Antibiotics       Medication List       Accurate as of September 10, 2019 12:44  PM. If you have any questions, ask your nurse or doctor.        STOP taking these medications   enoxaparin 40 MG/0.4ML injection Commonly known as: LOVENOX Stopped by: Shayra Anton, PA-C   fluticasone 50 MCG/ACT nasal spray Commonly known as: FLONASE Stopped by: Tenya Araque, PA-C   nitrofurantoin (macrocrystal-monohydrate) 100 MG capsule Commonly known as: MACROBID Stopped by: Zara Council, PA-C     TAKE these medications   alendronate 70 MG tablet Commonly known as: FOSAMAX Take 70 mg by mouth once a week.   alum & mag hydroxide-simeth 200-200-20 MG/5ML suspension Commonly known as: MAALOX/MYLANTA Take 30 mLs by mouth every 6 (six) hours as needed for indigestion or heartburn.   aspirin EC 81 MG tablet Take 81 mg by mouth daily.   diltiazem 240 MG 24 hr capsule Commonly known as: CARDIZEM CD Take 240 mg by mouth daily.   docusate sodium 100 MG capsule Commonly known as: COLACE Take 1 capsule (100 mg total) by mouth 2 (two) times daily.   estradiol 0.1 MG/GM vaginal cream Commonly known as: ESTRACE Place 1 Applicatorful vaginally daily. Use pea sized amount M-W-Fr before bedtime   Iron (Ferrous Sulfate) 325 (65 Fe) MG Tabs Take 1 tablet by mouth 2 (two) times daily.   levothyroxine 100 MCG tablet Commonly known as: SYNTHROID Take 100 mcg by mouth daily. Take on an empty stomach with a glass of water at least 30-60 minutes before breakfast   potassium chloride  SA 20 MEQ tablet Commonly known as: KLOR-CON Take 20 mEq by mouth 2 (two) times daily.   pravastatin 80 MG tablet Commonly known as: PRAVACHOL Take 80 mg by mouth daily.   Premarin vaginal cream Generic drug: conjugated estrogens Place 1 Applicatorful vaginally daily. Use pea sized amount M-W-Fr before bedtime   torsemide 20 MG tablet Commonly known as: DEMADEX Take 20 mg by mouth daily.   trimethoprim 100 MG tablet Commonly known as: TRIMPEX Take 1 tablet (100 mg total) by mouth  daily.       Allergies:  Allergies  Allergen Reactions  . Azithromycin Diarrhea  . Levofloxacin Other (See Comments)    Pt prefers not to take because og side effects  . Sulfa Antibiotics     Family History: Family History  Problem Relation Age of Onset  . Breast cancer Neg Hx     Social History:  reports that she quit smoking about 12 years ago. She has never used smokeless tobacco. She reports current alcohol use. She reports that she does not use drugs.  ROS: Pertinent ROS in HPI  Physical Exam: BP (!) 148/82   Pulse 67   Ht 5' (1.524 m)   Wt 130 lb (59 kg)   BMI 25.39 kg/m   Constitutional:  Well nourished. Alert and oriented, No acute distress. HEENT: [place] AT, mask in place.  Trachea midline Cardiovascular: No clubbing, cyanosis, or edema. Respiratory: Normal respiratory effort, no increased work of breathing. Neurologic: Grossly intact, no focal deficits, moving all 4 extremities. Psychiatric: Normal mood and affect.  Laboratory Data: Lab Results  Component Value Date   WBC 8.0 12/29/2018   HGB 8.7 (L) 12/29/2018   HCT 27.2 (L) 12/29/2018   MCV 88.0 12/29/2018   PLT 151 12/29/2018    Lab Results  Component Value Date   CREATININE 0.54 12/29/2018   Urinalysis Component     Latest Ref Rng & Units 09/10/2019  Color, Urine     YELLOW YELLOW  Appearance     CLEAR CLEAR  Specific Gravity, Urine     1.005 - 1.030 1.015  pH     5.0 - 8.0 7.0  Glucose, UA     NEGATIVE mg/dL NEGATIVE  Hgb urine dipstick     NEGATIVE TRACE (A)  Bilirubin Urine     NEGATIVE NEGATIVE  Ketones, ur     NEGATIVE mg/dL NEGATIVE  Protein     NEGATIVE mg/dL NEGATIVE  Nitrite     NEGATIVE NEGATIVE  Leukocytes,Ua     NEGATIVE SMALL (A)  RBC / HPF     0 - 5 RBC/hpf 0-5  WBC, UA     0 - 5 WBC/hpf 0-5  Bacteria, UA     NONE SEEN NONE SEEN  Squamous Epithelial / LPF     0 - 5 6-10   I have reviewed the labs.   Pertinent Imaging: No new imaging since last  visit  Assessment & Plan:    1. Recurrent UTI - Urine Culture; Future -we will send for culture for completeness sake's, but will not prescribe antibiotic at this time - Urinalysis, Complete w Microscopic; Future  - Patient will try taking a daily stool softener to see if she can improve constipation and in turn reduce her recurrent UTIs  2. Vaginal atrophy -She could not tolerate the vaginal estrogen cream, so she discontinued the medication  3. Nocturia -Improved with the reducing of fluids in the evening  Return for follow up pending urine  culture results .  These notes generated with voice recognition software. I apologize for typographical errors.  Zara Council, PA-C  [facility] [address]  [place], [place] [address] [phone]

## 2019-09-11 ENCOUNTER — Telehealth: Payer: Self-pay | Admitting: Family Medicine

## 2019-09-11 LAB — URINE CULTURE: Culture: NO GROWTH

## 2019-09-11 NOTE — Telephone Encounter (Signed)
Patient notified

## 2019-09-11 NOTE — Telephone Encounter (Signed)
-----   Message from Nori Riis, PA-C sent at 09/11/2019  1:35 PM EDT ----- Please let Mrs. Bohlken know that her urine culture was negative.

## 2019-12-14 ENCOUNTER — Other Ambulatory Visit: Payer: Self-pay | Admitting: Internal Medicine

## 2019-12-14 DIAGNOSIS — Z1231 Encounter for screening mammogram for malignant neoplasm of breast: Secondary | ICD-10-CM

## 2020-01-02 ENCOUNTER — Ambulatory Visit
Admission: RE | Admit: 2020-01-02 | Discharge: 2020-01-02 | Disposition: A | Discharge status: Home / Self Care | Payer: Medicare HMO | Source: Ambulatory Visit | Attending: Internal Medicine | Admitting: Internal Medicine

## 2020-01-02 ENCOUNTER — Encounter (INDEPENDENT_AMBULATORY_CARE_PROVIDER_SITE_OTHER): Payer: Self-pay

## 2020-01-02 ENCOUNTER — Other Ambulatory Visit: Payer: Self-pay

## 2020-01-02 DIAGNOSIS — Z1231 Encounter for screening mammogram for malignant neoplasm of breast: Secondary | ICD-10-CM

## 2020-03-10 DIAGNOSIS — M47816 Spondylosis without myelopathy or radiculopathy, lumbar region: Secondary | ICD-10-CM | POA: Insufficient documentation

## 2020-05-08 ENCOUNTER — Other Ambulatory Visit: Payer: Self-pay | Admitting: Internal Medicine

## 2020-05-08 DIAGNOSIS — R9389 Abnormal findings on diagnostic imaging of other specified body structures: Secondary | ICD-10-CM

## 2020-05-08 DIAGNOSIS — R0602 Shortness of breath: Secondary | ICD-10-CM

## 2020-05-14 ENCOUNTER — Other Ambulatory Visit: Payer: Self-pay

## 2020-05-14 ENCOUNTER — Ambulatory Visit
Admission: RE | Admit: 2020-05-14 | Discharge: 2020-05-14 | Disposition: A | Discharge status: Home / Self Care | Payer: Medicare HMO | Source: Ambulatory Visit | Attending: Internal Medicine | Admitting: Internal Medicine

## 2020-05-14 DIAGNOSIS — R0602 Shortness of breath: Secondary | ICD-10-CM | POA: Insufficient documentation

## 2020-05-14 DIAGNOSIS — R9389 Abnormal findings on diagnostic imaging of other specified body structures: Secondary | ICD-10-CM | POA: Insufficient documentation

## 2020-05-28 NOTE — Telephone Encounter (Signed)
Opened in error

## 2020-07-24 ENCOUNTER — Other Ambulatory Visit: Payer: Self-pay | Admitting: Surgery

## 2020-07-24 DIAGNOSIS — M7061 Trochanteric bursitis, right hip: Secondary | ICD-10-CM

## 2020-07-24 DIAGNOSIS — S72141D Displaced intertrochanteric fracture of right femur, subsequent encounter for closed fracture with routine healing: Secondary | ICD-10-CM

## 2020-08-12 ENCOUNTER — Other Ambulatory Visit: Payer: Self-pay

## 2020-08-12 ENCOUNTER — Ambulatory Visit
Admission: RE | Admit: 2020-08-12 | Discharge: 2020-08-12 | Disposition: A | Discharge status: Home / Self Care | Payer: Medicare HMO | Source: Ambulatory Visit | Attending: Surgery | Admitting: Surgery

## 2020-08-12 DIAGNOSIS — M7061 Trochanteric bursitis, right hip: Secondary | ICD-10-CM | POA: Insufficient documentation

## 2020-08-12 DIAGNOSIS — S72141D Displaced intertrochanteric fracture of right femur, subsequent encounter for closed fracture with routine healing: Secondary | ICD-10-CM | POA: Diagnosis present

## 2020-08-14 ENCOUNTER — Other Ambulatory Visit: Payer: Self-pay | Admitting: Internal Medicine

## 2020-08-14 DIAGNOSIS — Z1231 Encounter for screening mammogram for malignant neoplasm of breast: Secondary | ICD-10-CM

## 2020-09-08 ENCOUNTER — Ambulatory Visit: Payer: Self-pay | Admitting: Surgery

## 2020-09-15 ENCOUNTER — Other Ambulatory Visit: Payer: Self-pay

## 2020-09-15 ENCOUNTER — Ambulatory Visit: Payer: Medicare HMO | Admitting: Surgery

## 2020-09-15 ENCOUNTER — Encounter: Payer: Self-pay | Admitting: Surgery

## 2020-09-15 VITALS — BP 131/67 | HR 50 | Temp 98.3°F | Ht 60.0 in | Wt 132.4 lb

## 2020-09-15 DIAGNOSIS — K449 Diaphragmatic hernia without obstruction or gangrene: Secondary | ICD-10-CM | POA: Diagnosis not present

## 2020-09-15 DIAGNOSIS — R1084 Generalized abdominal pain: Secondary | ICD-10-CM

## 2020-09-15 NOTE — Patient Instructions (Addendum)
We will get you scheduled for a Barium swallow and a CT scan of your abdomen and pelvis with oral contrast. We will call you about these appointments.   We will send a referral to [facility] to have you set up for an Upper Endoscopy scope. They will call  you to schedule this.   We will have you come back to see Dr Dahlia Byes after these studies are completed. We will send you a letter in August to follow up in September.     Laparoscopic Nissen Fundoplication Laparoscopic Nissen fundoplication is surgery to relieve heartburn and other problems caused by gastric fluids flowing up into your esophagus. The esophagus is the tube that carries food and liquid from your throat to your stomach. Normally, the muscle that sits between your stomach and your esophagus (lower esophageal sphincter or LES) keeps stomach fluids in your stomach. In some people, the LES does not work properly, and stomach fluids flow up into the esophagus. This can happen when part of the stomach bulges through the LES (hiatal hernia). The backward flow of stomach fluids can cause a type of severe and long-standing heartburn that is called gastroesophageal reflux disease (GERD). You may need this surgery if other treatments for GERD have not helped. In a laparoscopic Nissen fundoplication, the upper part of your stomach is wrapped around the lower part of your esophagus to strengthen the LES and prevent reflux. If you have a hiatal hernia, it will also be repaired with this surgery. The procedure is done through several small incisions in your abdomen. It is performed using a thin, telescopic instrument (laparoscope) and other instruments that can pass through the scope or through other small incisions. Tell a health care provider about: Any allergies you have. All medicines you are taking, including vitamins, herbs, eye drops, creams, and over-the-counter medicines. Any problems you or family members have had with  anesthetic medicines. Any blood disorders you have. Any surgeries you have had. Any medical conditions you have. What are the risks? Generally, this is a safe procedure. However, problems may occur, including: Difficulty swallowing (dysphagia). Bloating. Nausea or vomiting. Damage to the lung, causing a collapsed lung. Infection or bleeding. What happens before the procedure? Ask your health care provider about: Changing or stopping your regular medicines. This is especially important if you are taking diabetes medicines or blood thinners. Taking medicines such as aspirin and ibuprofen. These medicines can thin your blood. Do not take these medicines before your procedure if your health care provider asks you not to. Follow your health care provider's instructions about eating or drinking restrictions. Plan to have someone take you home after the procedure. What happens during the procedure? An IV tube will be inserted into one of your veins. It will be used to give you fluids and medicines during the procedure. You will be given a medicine that makes you fall asleep (general anesthetic). Your abdomen will be cleaned with a germ-killing solution (antiseptic). The surgeon will make a small incision in your abdomen and insert a tube through the incision. Your abdomen will be filled with a gas. This helps the surgeon to see your organs more easily and it makes more space to work. The surgeon will insert the laparoscope through the incision. The scope has a camera that will send pictures to a monitor in the operating room. The surgeon will make several other small incisions in your abdomen to insert the other instruments that are needed during the procedure. Another  instrument (dilator) will be passed through your mouth and down your esophagus into the upper part of your stomach. The dilator will prevent your LES from being closed too tightly during surgery. The surgeon will pass the top  portion of your stomach behind the lower part of your esophagus and wrap it all the way around. This will be stitched into place. If you have a hiatal hernia, it will be repaired during this procedure. All instruments will be removed, and your incisions will be closed under your skin with stitches (sutures). Skin adhesive strips may also be used. A bandage (dressing) will be placed on your skin over the incisions. The procedure may vary among health care providers and hospitals. What happens after the procedure? You will be moved to a recovery area. Your blood pressure, heart rate, breathing rate, and blood oxygen level will be monitored often until the medicines you were given have worn off. You will be given pain medicine as needed. Your IV tube will be kept in until you are able to drink fluids. This information is not intended to replace advice given to you by your health care provider. Make sure you discuss any questions you have with your health care provider. Document Released: 04/12/2014 Document Revised: 08/28/2015 Document Reviewed: 11/21/2013 Elsevier Interactive Patient Education  2017 [employer]   Laparoscopic Nissen Fundoplication, Care After Refer to this sheet in the next few weeks. These instructions provide you with information about caring for yourself after your procedure. Your health care provider may also give you more specific instructions. Your treatment has been planned according to current medical practices, but problems sometimes occur. Call your health care provider if you have any problems or questions after your procedure. What can I expect after the procedure? After the procedure, it is common to have: Difficulty swallowing (dysphagia). Excess gas (bloating). Follow these instructions at home: Medicines  Take medicines only as directed by your health care provider. Do not drive or operate heavy machinery while taking pain medicine. Incision care  There are  many different ways to close and cover an incision, including stitches (sutures), skin glue, and adhesive strips. Follow your health care provider's instructions about: Incision care. Bandage (dressing) changes and removal. Incision closure removal. Check your incision areas every day for signs of infection. Watch for: Redness, swelling, or pain. Fluid, blood, or pus. Do not take baths, swim, or use a hot tub until your health care provider approves. Take showers as directed by your health care provider. Eating and drinking  Follow your health care provider's instructions about eating. You may need to follow a liquid-only diet for 2 weeks, followed by a diet of soft foods for 2 weeks. You should return to your usual diet gradually. Drink enough fluid to keep your urine clear or pale yellow. Activity  Return to your normal activities as directed by your health care provider. Ask your health care provider what activities are safe for you. Avoid strenuous exercise. Do not lift anything that is heavier than 10 lb (4.5 kg). Ask your health care provider when you can: Return to sexual activity. Drive. Go back to work. Contact a health care provider if: You have a fever. Your pain gets worse or is not helped by medicine. You have frequent nausea or vomiting. You have continued abdominal bloating. You have an ongoing (persistent) cough. You have redness, swelling, or pain in any incision areas. You have fluid, blood, or pus coming from any incisions. Get help right away  if: You have trouble breathing. You are unable to swallow. You have persistent vomiting. You have blood in your vomit. You have severe abdominal pain. This information is not intended to replace advice given to you by your health care provider. Make sure you discuss any questions you have with your health care provider. Document Released: 11/13/2003 Document Revised: 08/28/2015 Document Reviewed: 11/21/2013 Elsevier  Interactive Patient Education  2017 [place].   Diet After Nissen Fundoplication Surgery This diet information is for patients who have recently had Nissen fundoplication surgery to correct reflux disease or to repair various types of hernias, such as hiatal hernia and intrathoracic stomach. This diet may also be used for other gastrointestinal surgeries, such as Heller myotomy and repair of achalasia. The diet will help control diarrhea, excess gas and swallowing problems, which may occur after this type of surgery. Keeping Your Stomach from Stretching Eat small, frequent meals (six to eight per day). This will help you consume the majority of the nutrients you need without causing your stomach to feel full or distended.  Drinking large amounts of fluids with meals can stretch your stomach. You may drink fluids between meals as often as you like, but limit fluids to 1/2 cup (4 fluid ounces) with meals and one cup (8 fluid ounces) with snacks.  Sit upright while eating and stay upright for 30 minutes after each meal. Gravity can help food move through your digestive tract. Do not lie down after eating. Sit upright for 2 hours after your last meal or snack of the day.  Eat very slowly. Take your time when eating.  Take small bites and chew your food well to help aid in swallowing and digestion.  Avoid crusty breads and sticky, gummy foods, such as bananas, fresh doughy breads, rolls and doughnuts. These types of foods become sticky and difficult to swallow.  Toasted breads tend to be better tolerated.  Lastly, if you eat sweets, consume them at the end of your meal to avoid a group of symptoms referred to as "dumping syndrome". This describes the rapid emptying of foods from the stomach to the small intestine. Sweetened beverages, candy and desserts move more rapidly and dump quickly into the intestines. This can cause symptoms of nausea, weakness, cold sweats, cramps, diarrhea and dizzy  spells.  Avoiding Gas Avoid drinking through a straw. Do not chew gum or tobacco. These actions cause you to swallow air, which produces excess gas in your stomach. Chew with your mouth closed.  Avoid any foods that cause stomach gas and distention. These foods include corn, dried beans, peas, lentils, onions, broccoli, cauliflower and any food from the cabbage family.  Avoid carbonated drinks, alcohol, citrus and tomato products.  When will I be able to eat a soft diet? After Nissen fundoplication surgery, your diet will be advanced slowly by your surgeon. Generally, you will be on a clear liquid diet for the first few meals. Then you will advance to the full liquid diet for a meal or two and eventually to a Nissen soft diet. Please be aware that each patient's tolerance to food is different. Your doctor will advance your diet depending on how well you progress after surgery. Clear Liquid Diet  The first diet after surgery is the clear liquid diet. It includes the following liquids: Apple juice  Cranberry juice  Grape juice  Chicken broth  Beef broth  Flavored gelatin (Jell-O)  Decaf tea and coffee  Caffeinated beverages are permitted based on tolerance  Popsicles  [place] ice Carbonated drinks (sodas) are not allowed for the first six to eight weeks after surgery. After this time you can try them again in small amounts.  Full Liquid Diet The full liquid diet contains anything on the clear liquid diet, plus: Milk, soy, rice and almond (no chocolate)  Cream of wheat, cream of rice, grits  Strained creamed soups (no tomato or broccoli)  Vanilla and strawberry-flavored ice cream  Sherbet  Blended, custard styled or whipped yogurt (plain or vanilla only)  Vanilla and butterscotch pudding (no chocolate or coconut)  Nutritional drinks including Ensure, Boost, Carnation Instant Breakfast (no chocolate-flavored) Note: Dairy products, such as milk, ice cream and pudding, may cause  diarrhea in some people just after surgery. You may need to avoid milk products. If so, substitute them with lactose-free beverages, such as soy, rice, Lactaid or almond milks.  Nissen Soft Diet Food Category Foods to Choose Foods to Avoid  Beverages Milk, such as, whole, 2%, 1%, non-fat, or skim, soy, rice, almond  Caffeinated and decaf tea and coffee  Powdered drink mixes (in moderation)  Non-citrus juices (apple, grape, cranberry or blends of these)  Fruit nectars  Nutritional drinks including Boost, Ensure, Carnation Instant Breakfast Chocolate milk, cocoa or other chocolate-flavored drinks  Carbonated drinks  Alcohol  Citrus juices like orange, grapefruit, lemon and lime  Breads Pancakes, [place] toast and waffles  Crackers (saltine, butter, soda, graham, Goldfish and Cheese Nips)  Toasted bread Untoasted bread, bagels, [place] and hard rolls, English muffins  Crackers with nuts, seeds, fresh or dried fruit, coconut, or highly seasoned, such as garlic or onion-flavored  Sweet rolls, coffee cake or doughnuts  Cereals Well cooked cereals, such as oatmeal (plain or flavored)  Cold cereal (Cornflakes, Rice Krispies, Cheerios, Special K plain, Rice Chex and puffed rice) Very coarse cereal, such as bran, shredded wheat  Any cereal with fresh or dried fruit, coconut, seeds or nuts  Desserts Eat in moderation and do not eat desserts or sweets by themselves. Plain cakes, cookies and cream-filled pies  Vanilla and butterscotch pudding or custard  Ice cream, ice milk, frozen yogurt and sherbet  Gelatin made from allowed foods  Fruit ices and popsicles Desserts containing chocolate, coconut, nuts, seeds, fresh or dried fruit, peppermint or spearmint  Eggs  Poached, hard boiled or scrambled Fried eggs and highly seasoned eggs (deviled eggs)  Fats Eat in moderation. Butter and margarine  Mayonnaise and vegetable oils  Mildly seasoned cream sauces and gravies  Plain cream cheese  Sour  cream Highly seasoned salad dressings, cream sauces and gravies  Bacon, bacon fat, ham fat, lard and salt pork  Fried foods  Nuts  Fruits Fruit juice  Any canned or cooked fruit except those listed in the AVOID column ALL fresh fruits, such as citrus, bananas and pineapple  Canned pineapple  Dried fruits, such as raisins, berries  Fruits with seeds, such as berries, kiwi and figs  Meat, Fish, Poultry, and [employer] may be ground, minced or chopped to ease swallowing and digestion  Tender, well cooked and moist cuts of beef, chicken, [place] and pork  Veal and lamb  Flaky, cooked fish  Canned tuna  Cottage and ricotta cheeses  Mild cheese, such as American, brick, mozzarella and baby Swiss  Creamy peanut butter  Plain custard or blended fruit yogurt  Moist casseroles, such as macaroni & cheese, tuna noodle  Grilled or toasted cheese sandwich Tough meats with a lot of gristle  Fried,  highly seasoned, smoked and fatty meat, fish or poultry, such as frankfurters, luncheon meats, sausage, bacon, spare ribs, beef brisket, sardines, anchovies, duck and goose  Chili and other entrees made with pepper or chili pepper  Shellfish  Strongly flavored cheeses, such as sharp cheese, extra sharp cheddar, cheese containing peppers or other seasonings  Crunchy peanut butter  Any yogurt with nuts, seeds, coconut, strawberries or raspberries  Potatoes and Starches Peeled, mashed or boiled white or sweet potatoes  Oven-baked potatoes without skin  Well cooked white rice, enriched noodles, barley, spaghetti, macaroni and other pastas Fried potatoes, potato skins and potato chips  Hard and soft taco shells  Fried, brown or wild rice  Soups Mildly flavored meat stocks  Cream soups made from allowed foods Highly seasoned soups and tomato based soups, cream soups made with gas producing vegetables, such as broccoli, cauliflower, onion, etc.  Sweets and Snacks Use in moderation and do not eat large  amounts of sweets by themselves. Syrup, honey, jelly and seedless jam  Plain hard candies and plain candies made with allowed ingredients  Molasses  Marshmallows  Other candy made from allowed ingredients  Thin pretzels Jam, marmalade and preserves  Chocolate in any form  Any candy containing nuts, coconut, seeds, peppermint, spearmint or dried or fresh fruit  Popcorn, potato chips, tortilla chips  Soft or hard thick pretzels, such as sourdough  Vegetables Well cooked soft vegetables without seeds or skins, such as asparagus tips, beets, carrots, green and wax beans, chopped spinach, tender canned baby peas, squash and pumpkin Raw vegetables, tomatoes, tomato juice, tomato sauce and V-8 juice  Gas producing vegetables, such as broccoli, Brussel sprouts, cabbage, cauliflower, onions, corn, cucumber, green peppers, rutabagas, turnips, radishes and sauerkraut  Dried beans, peas and lentils  Miscellaneous Salt and spices in moderation  Mustard and vinegar in moderation Fried or highly seasoned foods  Coconut and seeds  Pickles and olives  Chili sauces, ketchup, barbecue sauce, horseradish, black pepper, chili powder and onion and garlic seasonings  Any other strongly flavored seasoning, condiment, spice or herb not tolerated  Any food not tolerated

## 2020-09-16 ENCOUNTER — Telehealth: Payer: Self-pay

## 2020-09-16 NOTE — Progress Notes (Signed)
Patient ID: Kelly Wood, female   DOB: 07/03/1940, 80 y.o.   MRN: [id number]  HPI Kelly Wood is a 80 y.o. female in consultation at the request of Dr.Aleskerov for a paraesophageal hernia.  He has difficult pulmonary issues with history of COPD.  He also had a prior pneumothorax more than 30 years ago on the right side that required chest tube placement.  Does have some dyspnea on exertion likely related to her COPD.  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  There is no evidence of incarceration or strangulation.  She also reports some retrosternal pain that is intermittent atypical without any specific alleviating or aggravating factors.  She does express reflux that has improved with PPI.  He denies any dysphagia.  She also reports some chronic nausea and indigestion. CBC and CMP were completely normal. Of note she underwent intramedullary nail in 2020 and will require revision surgery by Dr. Gardiner Ramus in the near future.  She does experiences significant pain in her right hip.  She did have a tubal ligation decades ago via infraumbilical midline laparotomy  HPI  Past Medical History:  Diagnosis Date   Anemia    GERD (gastroesophageal reflux disease)    Hyperlipidemia    Hypothyroidism     Past Surgical History:  Procedure Laterality Date   COLONOSCOPY N/A 06/23/2016   Procedure: COLONOSCOPY;  Surgeon: Manya Silvas, MD;  Location: [place];  Service: Endoscopy;  Laterality: N/A;   FEMUR FRACTURE SURGERY Right 02/07/2019   rod placed   INTRAMEDULLARY (IM) NAIL INTERTROCHANTERIC Right 12/26/2018   Procedure: INTRAMEDULLARY (IM) NAIL INTERTROCHANTRIC;  Surgeon: Corky Mull, MD;  Location: [facility];  Service: Orthopedics;  Laterality: Right;   TUBAL LIGATION      Family History   Problem Relation Age of Onset   Breast cancer Neg Hx     Social History Social History   Tobacco Use   Smoking status: Former    Pack years: 0.00    Types: Cigarettes    Quit date: 10/24/2006    Years since quitting: 13.9   Smokeless tobacco: Never  Vaping Use   Vaping Use: Never used  Substance Use Topics   Alcohol use: Yes    Comment: rarely   Drug use: Never    Allergies  Allergen Reactions   Azithromycin Diarrhea   Levofloxacin Other (See Comments)    Pt prefers not to take because og side effects   Sulfa Antibiotics     Current Outpatient Medications  Medication Sig Dispense Refill   albuterol (VENTOLIN HFA) 108 (90 Base) MCG/ACT inhaler Inhale into the lungs.     alendronate (FOSAMAX) 70 MG tablet Take 70 mg by mouth once a week.     alum & mag hydroxide-simeth (MAALOX/MYLANTA) 200-200-20 MG/5ML suspension Take 30 mLs by mouth every 6 (six) hours as needed for indigestion or heartburn. 355 mL 0   aspirin EC 81 MG tablet Take 81 mg by mouth daily.     diltiazem (CARDIZEM CD) 240 MG 24 hr capsule Take 240 mg by mouth daily.     docusate sodium (COLACE) 100 MG capsule Take 1 capsule (100 mg total) by mouth 2 (two) times daily. 60 capsule 0   pantoprazole (PROTONIX) 20 MG tablet Take 20 mg by mouth 2 (two) times  daily.     potassium chloride SA (K-DUR,KLOR-CON) 20 MEQ tablet Take 20 mEq by mouth 2 (two) times daily.     pravastatin (PRAVACHOL) 80 MG tablet Take 80 mg by mouth daily.     torsemide (DEMADEX) 20 MG tablet Take 20 mg by mouth daily.     trimethoprim (TRIMPEX) 100 MG tablet Take 1 tablet (100 mg total) by mouth daily. 30 tablet 0   levothyroxine (SYNTHROID) 100 MCG tablet Take 100 mcg by mouth daily. Take on an empty stomach with a glass of water at least 30-60 minutes before breakfast     No current facility-administered medications for this visit.     Review of Systems Full ROS  was asked and was negative except for the information on the  HPI  Physical Exam Blood pressure 131/67, pulse (!) 50, temperature 98.3 F (36.8 C), height 5' (1.524 m), weight 132 lb 6.4 oz (60.1 kg), SpO2 93 %. CONSTITUTIONAL: NAD EYES: Pupils are equal, round, and reactive to light, Sclera are non-icteric. EARS, NOSE, MOUTH AND THROAT: She is wearing a mask. Hearing is intact to voice. LYMPH NODES:  Lymph nodes in the neck are normal. RESPIRATORY:  Lungs are clear. There is normal respiratory effort, with equal breath sounds bilaterally, and without pathologic use of accessory muscles. CARDIOVASCULAR: Heart is regular without murmurs, gallops, or rubs. GI: The abdomen is soft, nontender, and nondistended. There are no palpable masses. There is no hepatosplenomegaly. There are normal bowel sounds in all quadrants. GU: Rectal deferred.  Midline laparotomy scar infra umbilically MUSCULOSKELETAL: Normal muscle strength and tone. No cyanosis or edema.   SKIN: Turgor is good and there are no pathologic skin lesions or ulcers. NEUROLOGIC: Motor and sensation is grossly normal. Cranial nerves are grossly intact. PSYCH:  Oriented to person, place and time. Affect is normal.  Data Reviewed  I have personally reviewed the patient's imaging, laboratory findings and medical records.    Assessment/Plan 80 year old female with a large type III paraesophageal hernia that symptomatic.  Discussed with the patient in detail about her disease process.  This is certainly contributing to her pulmonary disease.  Wishes to address the paraesophageal hernia and I am in agreement with her.  We will proceed with CT scan of the abdomen pelvis to further delineate the intra-abdominal anatomy.  We will also order a barium swallow to assess for dysfunction and finally we will get an EGD to rule out any strictures or intraluminal lesions.  SHe does have revision hip surgery schedule and that she will be addressed first.  Certainly there is no need for urgency regarding the  paraesophageal hernia repair.  Extensive counseling provided Time spent with the patient was 60 minutes, with more than 50% of the time spent in face-to-face education, counseling and care coordination.     [name], MD FACS General Surgeon 09/16/2020, 10:05 AM

## 2020-09-16 NOTE — Telephone Encounter (Signed)
Call to the patient regarding imaging studies. The patient is scheduled for a Barium swallow study at [facility] on 09/19/20 at 9:30 am. She will arrive at the [place] entrance at 9:00 am and will have nothing to eat or drink for 3 hours prior. Patient has been scheduled for a CT abdomen/pelvis without contrast at the [facility] on 10/08/20 at 9:00 am, she will arrive there by 8:45 am, NPO 4 hours prior and pick up prep kit. Patient verbalizes understanding.

## 2020-10-02 ENCOUNTER — Telehealth: Payer: Self-pay

## 2020-10-02 ENCOUNTER — Ambulatory Visit
Admission: RE | Admit: 2020-10-02 | Discharge: 2020-10-02 | Disposition: A | Discharge status: Home / Self Care | Payer: Medicare HMO | Source: Ambulatory Visit | Attending: Surgery | Admitting: Surgery

## 2020-10-02 ENCOUNTER — Other Ambulatory Visit: Payer: Self-pay

## 2020-10-02 DIAGNOSIS — K449 Diaphragmatic hernia without obstruction or gangrene: Secondary | ICD-10-CM

## 2020-10-02 NOTE — Telephone Encounter (Signed)
Per Dr Dahlia Byes the swallowing study did show a large hiatal hernia but no other concerns. Left message for the patient to call back for these results.

## 2020-10-02 NOTE — Telephone Encounter (Signed)
Spoke with the patient and let her know her results.

## 2020-10-08 ENCOUNTER — Other Ambulatory Visit: Payer: Self-pay

## 2020-10-08 ENCOUNTER — Telehealth: Payer: Self-pay | Admitting: Surgery

## 2020-10-08 ENCOUNTER — Telehealth: Payer: Self-pay

## 2020-10-08 ENCOUNTER — Ambulatory Visit
Admission: RE | Admit: 2020-10-08 | Discharge: 2020-10-08 | Disposition: A | Discharge status: Home / Self Care | Payer: Medicare HMO | Source: Ambulatory Visit | Attending: Surgery | Admitting: Surgery

## 2020-10-08 DIAGNOSIS — R1084 Generalized abdominal pain: Secondary | ICD-10-CM | POA: Diagnosis present

## 2020-10-08 DIAGNOSIS — K449 Diaphragmatic hernia without obstruction or gangrene: Secondary | ICD-10-CM | POA: Insufficient documentation

## 2020-10-08 NOTE — Telephone Encounter (Signed)
Received call back from patient. She is aware of CT results, and will follow up in September to see Dr. Dahlia Byes

## 2020-10-08 NOTE — Telephone Encounter (Signed)
Left message regarding CT results- per Dr.Pabon shows Large hernia-no other surprises .

## 2020-10-17 ENCOUNTER — Emergency Department: Payer: Medicare HMO

## 2020-10-17 ENCOUNTER — Other Ambulatory Visit: Payer: Self-pay

## 2020-10-17 ENCOUNTER — Ambulatory Visit
Admission: EM | Admit: 2020-10-17 | Discharge: 2020-10-17 | Disposition: A | Discharge status: Home / Self Care | Payer: Medicare HMO | Attending: Family Medicine | Admitting: Family Medicine

## 2020-10-17 ENCOUNTER — Encounter: Payer: Self-pay | Admitting: Emergency Medicine

## 2020-10-17 ENCOUNTER — Emergency Department
Admission: EM | Admit: 2020-10-17 | Discharge: 2020-10-17 | Disposition: A | Discharge status: Home / Self Care | Payer: Medicare HMO | Attending: Emergency Medicine | Admitting: Emergency Medicine

## 2020-10-17 DIAGNOSIS — R110 Nausea: Secondary | ICD-10-CM | POA: Diagnosis not present

## 2020-10-17 DIAGNOSIS — R9431 Abnormal electrocardiogram [ECG] [EKG]: Secondary | ICD-10-CM | POA: Diagnosis not present

## 2020-10-17 DIAGNOSIS — Z5321 Procedure and treatment not carried out due to patient leaving prior to being seen by health care provider: Secondary | ICD-10-CM | POA: Diagnosis not present

## 2020-10-17 LAB — CBC
HCT: 35.4 % — ABNORMAL LOW (ref 36.0–46.0)
Hemoglobin: 11.5 g/dL — ABNORMAL LOW (ref 12.0–15.0)
MCH: 26.9 pg (ref 26.0–34.0)
MCHC: 32.5 g/dL (ref 30.0–36.0)
MCV: 82.9 fL (ref 80.0–100.0)
Platelets: 212 10*3/uL (ref 150–400)
RBC: 4.27 MIL/uL (ref 3.87–5.11)
RDW: 13.9 % (ref 11.5–15.5)
WBC: 9.3 10*3/uL (ref 4.0–10.5)
nRBC: 0 % (ref 0.0–0.2)

## 2020-10-17 LAB — BASIC METABOLIC PANEL
Anion gap: 10 (ref 5–15)
BUN: 12 mg/dL (ref 8–23)
CO2: 26 mmol/L (ref 22–32)
Calcium: 9.1 mg/dL (ref 8.9–10.3)
Chloride: 99 mmol/L (ref 98–111)
Creatinine, Ser: 0.57 mg/dL (ref 0.44–1.00)
GFR, Estimated: >60 mL/min (ref >60)
Glucose, Bld: 115 mg/dL — ABNORMAL HIGH (ref 70–99)
Potassium: 3.5 mmol/L (ref 3.5–5.1)
Sodium: 135 mmol/L (ref 135–145)

## 2020-10-17 LAB — TROPONIN I (HIGH SENSITIVITY)
Troponin I (High Sensitivity): 4 ng/L (ref <18)
Troponin I (High Sensitivity): 4 ng/L (ref <18)

## 2020-10-17 MED ORDER — ASPIRIN 81 MG PO CHEW
324.0000 mg | CHEWABLE_TABLET | Freq: Once | ORAL | Status: AC
  Administered 2020-10-17: 324 mg via ORAL

## 2020-10-17 MED ORDER — ONDANSETRON 4 MG PO TBDP
4.0000 mg | ORAL_TABLET | Freq: Once | ORAL | Status: AC
  Administered 2020-10-17: 4 mg via ORAL

## 2020-10-17 NOTE — ED Notes (Signed)
Patient is being discharged from the Urgent Care and sent to the [facility] Emergency Department via EMS . Per Dr. Lacinda Axon, patient is in need of higher level of care due to abnormal EKG. Patient is aware and verbalizes understanding of plan of care.  Vitals:   10/17/20 1754  BP: (!) 150/75  Pulse: 73  Resp: 18  Temp: 99.5 F (37.5 C)  SpO2: 95%

## 2020-10-17 NOTE — ED Triage Notes (Signed)
Pt presents to ER from [facility], reports she was having nausea this morning, but at present feeling fine, pt reports at present only belching. Pt denies any chest pain, denies any other symptom at present. Pt talks in complete sentences no distress noted.

## 2020-10-17 NOTE — ED Notes (Signed)
IV in place, pt took 324mg  of aspirin prior to arrival to ER

## 2020-10-17 NOTE — ED Triage Notes (Addendum)
Patient states that she has been having nausea x 230pm today. Patient states that she noticed some diarrhea afterwards. Recently had her thyroid med dosage change on Tuesday, unsure if this is related. States that she has not vomited but she feels very nausea. Patient states that she took pepto and drank gingerale and was able to keep it down. States that she has noticed a belching since this started.

## 2020-10-17 NOTE — ED Provider Notes (Signed)
[facility]    CSN: [id number] Arrival date & time: 10/17/20  1718      History   Chief Complaint Chief Complaint  Patient presents with   Nausea    HPI 80 year old female presents with nausea.  Patient is a former smoker.  She has had a recent CT which showed a large hiatal hernia.  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    Patient states that she developed nausea abruptly today around 2:30 PM.  She states that her stools have been normal.  She states that she is recently had a change in her thyroid medication.  Unsure if this is contributing.  She reports associated belching.  She has taken some Pepto-Bismol as well as Tums without resolution.  Denies chest pain.  Denies shortness of breath.  No reports of diaphoresis.  No known inciting factor.  No known exacerbating factors.  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/11/2019   GERD (gastroesophageal reflux disease) 05/11/2019   Hyperlipidemia 05/11/2019   Shingles 05/11/2019   Thyroid disease 05/11/2019   Closed right hip fracture ([place]) 12/26/2018   Anemia due to vitamin B12 deficiency 11/06/2013    Past Surgical History:  Procedure Laterality Date   COLONOSCOPY N/A 06/23/2016   Procedure: COLONOSCOPY;  Surgeon: Manya Silvas, MD;  Location: [place];  Service: Endoscopy;  Laterality: N/A;   FEMUR FRACTURE SURGERY Right 02/07/2019   rod placed   INTRAMEDULLARY (IM) NAIL INTERTROCHANTERIC Right 12/26/2018   Procedure: INTRAMEDULLARY (IM) NAIL INTERTROCHANTRIC;  Surgeon: Corky Mull, MD;  Location: [facility];  Service: Orthopedics;  Laterality: Right;   TUBAL LIGATION      OB History   No obstetric history on file.      Home Medications    Prior to Admission medications   Medication Sig  Start Date End Date Taking? Authorizing Provider  albuterol (VENTOLIN HFA) 108 (90 Base) MCG/ACT inhaler Inhale into the lungs. 05/28/20 05/28/21 Yes [provider]  alendronate (FOSAMAX) 70 MG tablet Take 70 mg by mouth once a week. 11/03/18  Yes [provider]  alum & mag hydroxide-simeth (MAALOX/MYLANTA) 200-200-20 MG/5ML suspension Take 30 mLs by mouth every 6 (six) hours as needed for indigestion or heartburn. 12/29/18  Yes Gouru, Illene Silver, MD  aspirin EC 81 MG tablet Take 81 mg by mouth daily.   Yes [provider]  diltiazem (CARDIZEM CD) 240 MG 24 hr capsule Take 240 mg by mouth daily.   Yes [provider]  docusate sodium (COLACE) 100 MG capsule Take 1 capsule (100 mg total) by mouth 2 (two) times daily. 12/29/18  Yes Gouru, Illene Silver, MD  levothyroxine (SYNTHROID) 50 MCG tablet Take 50 mcg by mouth daily. Take on an empty stomach with a glass of water at least 30-60 minutes before breakfast 10/23/18 10/17/20 Yes [provider]  pantoprazole (PROTONIX) 20 MG tablet Take 20 mg by mouth 2 (two) times daily.   Yes [provider]  potassium chloride SA (K-DUR,KLOR-CON) 20 MEQ tablet Take 20 mEq by mouth 2 (two) times daily.   Yes [provider]  pravastatin (PRAVACHOL) 80 MG tablet Take 80 mg by mouth daily.   Yes [provider]  torsemide (DEMADEX) 20 MG tablet Take 20 mg by mouth daily.  Yes [provider]  trimethoprim (TRIMPEX) 100 MG tablet Take 1 tablet (100 mg total) by mouth daily. 05/14/19  Yes Hollice Espy, MD    Family History Family History  Problem Relation Age of Onset   Breast cancer Neg Hx     Social History Social History   Tobacco Use   Smoking status: Former    Types: Cigarettes    Quit date: 10/24/2006    Years since quitting: 13.9   Smokeless tobacco: Never  Vaping Use   Vaping Use: Never used  Substance Use Topics   Alcohol use: Yes    Comment: rarely   Drug use: Never      Allergies   Azithromycin, Levofloxacin, and Sulfa antibiotics   Review of Systems Review of Systems Per HPI  Physical Exam Triage Vital Signs ED Triage Vitals  Enc Vitals Group     BP 10/17/20 1754 (!) 150/75     Pulse Rate 10/17/20 1754 73     Resp 10/17/20 1754 18     Temp 10/17/20 1754 99.5 F (37.5 C)     Temp Source 10/17/20 1754 Oral     SpO2 10/17/20 1754 95 %     Weight 10/17/20 1753 134 lb (60.8 kg)     Height 10/17/20 1753 5' (1.524 m)     Head Circumference --      Peak Flow --      Pain Score 10/17/20 1753 0     Pain Loc --      Pain Edu? --      Excl. in [place]? --    Updated Vital Signs BP (!) 150/75 (BP Location: Right Arm)   Pulse 73   Temp 99.5 F (37.5 C) (Oral)   Resp 18   Ht 5' (1.524 m)   Wt 60.8 kg   SpO2 95%   BMI 26.17 kg/m   Visual Acuity Right Eye Distance:   Left Eye Distance:   Bilateral Distance:    Right Eye Near:   Left Eye Near:    Bilateral Near:     Physical Exam Vitals and nursing note reviewed.  Constitutional:      General: She is not in acute distress.    Appearance: Normal appearance. She is not ill-appearing.  HENT:     Head: Normocephalic and atraumatic.  Eyes:     General:        Right eye: No discharge.        Left eye: No discharge.     Conjunctiva/sclera: Conjunctivae normal.  Cardiovascular:     Rate and Rhythm: Normal rate and regular rhythm.  Pulmonary:     Effort: Pulmonary effort is normal.     Breath sounds: Normal breath sounds. No wheezing or rales.  Abdominal:     General: There is no distension.     Palpations: Abdomen is soft.     Tenderness: There is no abdominal tenderness.  Neurological:     Mental Status: She is alert.  Psychiatric:        Mood and Affect: Mood normal.        Behavior: Behavior normal.     UC Treatments / Results  Labs (all labs ordered are listed, but only abnormal results are displayed) Labs Reviewed - No data to display  EKG Interpretation: Normal  sinus rhythm with a rate of 71.  Normal axis.  T wave inversions noted in lead V2 and V3.  These are new compared to prior EKG.  Radiology  No results found.  Procedures Procedures (including critical care time)  Medications Ordered in UC Medications  ondansetron (ZOFRAN-ODT) disintegrating tablet 4 mg (4 mg Oral Given 10/17/20 1837)  aspirin chewable tablet 324 mg (324 mg Oral Given 10/17/20 1844)    Initial Impression / Assessment and Plan / UC Course  I have reviewed the triage vital signs and the nursing notes.  Pertinent labs & imaging results that were available during my care of the patient were reviewed by me and considered in my medical decision making (see chart for details).    80 year old female presents with sudden onset nausea with associated belching.  CT abdomen pelvis that was recently done this month was reviewed.  It revealed large hiatal hernia as well as coronary artery disease and emphysema.  Given findings consistent with coronary disease and new EKG changes, I am concerned that patient is having a cardiac event.  Aspirin 324 was given.  Zofran 4 mg ODT given for nausea.  IV placed.  Patient is being transported to the hospital for further evaluation and management.  She needs serial cardiac enzymes.  Final Clinical Impressions(s) / UC Diagnoses   Final diagnoses:  Nausea  T wave inversion on electrocardiogram   Discharge Instructions   None    ED Prescriptions   None    PDMP not reviewed this encounter.   Coral Spikes, [place] 10/17/20 1906

## 2020-10-20 ENCOUNTER — Other Ambulatory Visit: Payer: Self-pay | Admitting: Surgery

## 2020-10-27 ENCOUNTER — Other Ambulatory Visit
Admission: RE | Admit: 2020-10-27 | Discharge: 2020-10-27 | Disposition: A | Discharge status: Home / Self Care | Payer: Medicare HMO | Source: Ambulatory Visit | Attending: Surgery | Admitting: Surgery

## 2020-10-27 ENCOUNTER — Other Ambulatory Visit: Payer: Self-pay

## 2020-10-27 HISTORY — DX: Essential (primary) hypertension: I10

## 2020-10-27 HISTORY — DX: Dyspnea, unspecified: R06.00

## 2020-10-27 HISTORY — DX: Chronic obstructive pulmonary disease, unspecified: J44.9

## 2020-10-27 NOTE — Patient Instructions (Addendum)
Your procedure is scheduled on: Thursday November 06, 2020. Report to Day Surgery inside [place] 2nd floor stop by admissions desk first before getting on elevator. To find out your arrival time please call [phone] between 1PM - 3PM on Wednesday November 05, 2020.  Remember: Instructions that are not followed completely may result in serious medical risk,  up to and including death, or upon the discretion of your surgeon and anesthesiologist your  surgery may need to be rescheduled.     _X__ 1. Do not eat food after midnight the night before your procedure.                 No chewing gum or hard candies. You may drink clear liquids up to 2 hours                 before you are scheduled to arrive for your surgery- DO not drink clear                 liquids within 2 hours of the start of your surgery.                 Clear Liquids include:  water, apple juice without pulp, clear Gatorade, G2 or                  Gatorade Zero (avoid Red/Purple/Blue), Black Coffee or Tea (Do not add                 anything to coffee or tea).  ___X_2.  Complete the "Ensure Clear Pre-surgery Clear Carbohydrate Drink" provided to you, 2 hours before arrival. **If you are diabetic you will be provided with an alternative drink, Gatorade Zero or G2.  __X__3.  On the morning of surgery brush your teeth with toothpaste and water, you                may rinse your mouth with mouthwash if you wish.  Do not swallow any toothpaste of mouthwash.     _X__ 4.  No Alcohol for 24 hours before or after surgery.   _X__ 5.  Do Not Smoke or use e-cigarettes For 24 Hours Prior to Your Surgery.                 Do not use any chewable tobacco products for at least 6 hours prior to                 Surgery.  _X__  6.  Do not use any recreational drugs (marijuana, cocaine, heroin, ecstasy, MDMA or other)                For at least one week prior to your surgery.  Combination of these drugs with  anesthesia                May have life threatening results.  __X__7.  Notify your doctor if there is any change in your medical condition      (cold, fever, infections).     Do not wear jewelry, make-up, hairpins, clips or nail polish. Do not wear lotions, powders, or perfumes. You may wear deodorant. Do not shave 48 hours prior to surgery. Men may shave face and neck. Do not bring valuables to the hospital.    [facility] is not responsible for any belongings or valuables.  Contacts, dentures or bridgework may not be worn into surgery. Leave your suitcase in the car. After surgery it may  be brought to your room. For patients admitted to the hospital, discharge time is determined by your treatment team.   Patients discharged the day of surgery will not be allowed to drive home.   Make arrangements for someone to be with you for the first 24 hours of your Same Day Discharge.   __X__ Take these medicines the morning of surgery with A SIP OF WATER:    1. levothyroxine (SYNTHROID) 50 MCG  2.   3.   4.  5.  6.  ____ Fleet Enema (as directed)   __X__ Use CHG Soap (or wipes) as directed  ____ Use Benzoyl Peroxide Gel as instructed  __X__ Use inhalers on the day of surgery  albuterol (VENTOLIN HFA) 108 (90 Base) MCG/ACT inhaler  ____ Stop metformin 2 days prior to surgery    ____ Take 1/2 of usual insulin dose the night before surgery. No insulin the morning          of surgery.   __X__ One week prior to surgery - Stop aspirin 81 mg.  __X__ One Week prior to surgery- Stop Anti-inflammatories such as Ibuprofen, Aleve, Advil, Motrin, meloxicam (MOBIC), diclofenac, etodolac, ketorolac, Toradol, Daypro, piroxicam, Goody's or BC powders. OK TO USE TYLENOL IF NEEDED   __X__ Stop all supplements until after surgery.    ____ Bring C-Pap to the hospital.    If you have any questions regarding your pre-procedure instructions,  Please call Pre-admit Testing at [phone].

## 2020-11-06 ENCOUNTER — Ambulatory Visit: Payer: Medicare HMO | Admitting: Certified Registered Nurse Anesthetist

## 2020-11-06 ENCOUNTER — Ambulatory Visit: Payer: Medicare HMO

## 2020-11-06 ENCOUNTER — Encounter: Payer: Self-pay | Admitting: Surgery

## 2020-11-06 ENCOUNTER — Encounter
Admission: RE | Disposition: A | Discharge status: Home / Self Care | Payer: Self-pay | Source: Home / Self Care | Attending: Surgery

## 2020-11-06 ENCOUNTER — Ambulatory Visit
Admission: RE | Admit: 2020-11-06 | Discharge: 2020-11-06 | Disposition: A | Discharge status: Home / Self Care | Payer: Medicare HMO | Attending: Surgery | Admitting: Surgery

## 2020-11-06 DIAGNOSIS — Z8249 Family history of ischemic heart disease and other diseases of the circulatory system: Secondary | ICD-10-CM | POA: Diagnosis not present

## 2020-11-06 DIAGNOSIS — M7061 Trochanteric bursitis, right hip: Secondary | ICD-10-CM | POA: Insufficient documentation

## 2020-11-06 DIAGNOSIS — Z881 Allergy status to other antibiotic agents status: Secondary | ICD-10-CM | POA: Diagnosis not present

## 2020-11-06 DIAGNOSIS — Z419 Encounter for procedure for purposes other than remedying health state, unspecified: Secondary | ICD-10-CM

## 2020-11-06 DIAGNOSIS — X58XXXA Exposure to other specified factors, initial encounter: Secondary | ICD-10-CM | POA: Insufficient documentation

## 2020-11-06 DIAGNOSIS — T85848A Pain due to other internal prosthetic devices, implants and grafts, initial encounter: Secondary | ICD-10-CM | POA: Insufficient documentation

## 2020-11-06 DIAGNOSIS — Z882 Allergy status to sulfonamides status: Secondary | ICD-10-CM | POA: Diagnosis not present

## 2020-11-06 DIAGNOSIS — Z7952 Long term (current) use of systemic steroids: Secondary | ICD-10-CM | POA: Insufficient documentation

## 2020-11-06 DIAGNOSIS — Z791 Long term (current) use of non-steroidal anti-inflammatories (NSAID): Secondary | ICD-10-CM | POA: Diagnosis not present

## 2020-11-06 DIAGNOSIS — Z8349 Family history of other endocrine, nutritional and metabolic diseases: Secondary | ICD-10-CM | POA: Insufficient documentation

## 2020-11-06 DIAGNOSIS — Z79899 Other long term (current) drug therapy: Secondary | ICD-10-CM | POA: Insufficient documentation

## 2020-11-06 DIAGNOSIS — Z7982 Long term (current) use of aspirin: Secondary | ICD-10-CM | POA: Diagnosis not present

## 2020-11-06 HISTORY — PX: HARDWARE REMOVAL: SHX979

## 2020-11-06 SURGERY — REMOVAL, HARDWARE
Anesthesia: General | Site: Hip | Laterality: Right

## 2020-11-06 MED ORDER — LIDOCAINE HCL (CARDIAC) PF 100 MG/5ML IV SOSY
PREFILLED_SYRINGE | INTRAVENOUS | Status: DC | PRN
  Administered 2020-11-06: 80 mg via INTRAVENOUS

## 2020-11-06 MED ORDER — GLYCOPYRROLATE 0.2 MG/ML IJ SOLN
INTRAMUSCULAR | Status: DC | PRN
  Administered 2020-11-06: .2 mg via INTRAVENOUS

## 2020-11-06 MED ORDER — FENTANYL CITRATE (PF) 100 MCG/2ML IJ SOLN
INTRAMUSCULAR | Status: DC | PRN
  Administered 2020-11-06: 25 ug via INTRAVENOUS

## 2020-11-06 MED ORDER — ACETAMINOPHEN 10 MG/ML IV SOLN: 1000.0000 mg | Freq: Once | INTRAVENOUS | Status: DC | PRN

## 2020-11-06 MED ORDER — ACETAMINOPHEN 10 MG/ML IV SOLN
INTRAVENOUS | Status: AC
  Filled 2020-11-06: qty 100

## 2020-11-06 MED ORDER — BUPIVACAINE HCL (PF) 0.5 % IJ SOLN
INTRAMUSCULAR | Status: AC
  Filled 2020-11-06: qty 30

## 2020-11-06 MED ORDER — TRAMADOL HCL 50 MG PO TABS: 50.0000 mg | ORAL_TABLET | Freq: Four times a day (QID) | ORAL | Status: DC | PRN

## 2020-11-06 MED ORDER — OXYCODONE HCL 5 MG PO TABS: 5.0000 mg | ORAL_TABLET | Freq: Once | ORAL | Status: AC | PRN

## 2020-11-06 MED ORDER — EPHEDRINE 5 MG/ML INJ
INTRAVENOUS | Status: AC
  Filled 2020-11-06: qty 5

## 2020-11-06 MED ORDER — FENTANYL CITRATE (PF) 100 MCG/2ML IJ SOLN: 25.0000 ug | INTRAMUSCULAR | Status: DC | PRN

## 2020-11-06 MED ORDER — PROPOFOL 10 MG/ML IV BOLUS
INTRAVENOUS | Status: DC | PRN
  Administered 2020-11-06: 140 mg via INTRAVENOUS
  Administered 2020-11-06: 60 mg via INTRAVENOUS

## 2020-11-06 MED ORDER — FAMOTIDINE 20 MG PO TABS
ORAL_TABLET | ORAL | Status: AC
  Administered 2020-11-06: 20 mg via ORAL
  Filled 2020-11-06: qty 1

## 2020-11-06 MED ORDER — CHLORHEXIDINE GLUCONATE 0.12 % MT SOLN: 15.0000 mL | Freq: Once | OROMUCOSAL | Status: AC

## 2020-11-06 MED ORDER — SEVOFLURANE IN SOLN
RESPIRATORY_TRACT | Status: AC
  Filled 2020-11-06: qty 250

## 2020-11-06 MED ORDER — FAMOTIDINE 20 MG PO TABS: 20.0000 mg | ORAL_TABLET | Freq: Once | ORAL | Status: AC

## 2020-11-06 MED ORDER — OXYCODONE HCL 5 MG/5ML PO SOLN
5.0000 mg | Freq: Once | ORAL | Status: AC | PRN
Start: 2020-11-06 — End: 2020-11-06

## 2020-11-06 MED ORDER — CEFAZOLIN SODIUM-DEXTROSE 2-4 GM/100ML-% IV SOLN
INTRAVENOUS | Status: AC
  Filled 2020-11-06: qty 100

## 2020-11-06 MED ORDER — CHLORHEXIDINE GLUCONATE 0.12 % MT SOLN
OROMUCOSAL | Status: AC
  Administered 2020-11-06: 15 mL via OROMUCOSAL
  Filled 2020-11-06: qty 15

## 2020-11-06 MED ORDER — LACTATED RINGERS IV SOLN: INTRAVENOUS | Status: DC

## 2020-11-06 MED ORDER — TRAMADOL HCL 50 MG PO TABS: 50.0000 mg | ORAL_TABLET | Freq: Four times a day (QID) | ORAL | 0 refills | Status: DC | PRN

## 2020-11-06 MED ORDER — BUPIVACAINE HCL (PF) 0.5 % IJ SOLN
INTRAMUSCULAR | Status: DC | PRN
  Administered 2020-11-06: 30 mL

## 2020-11-06 MED ORDER — ORAL CARE MOUTH RINSE: 15.0000 mL | Freq: Once | OROMUCOSAL | Status: AC

## 2020-11-06 MED ORDER — FENTANYL CITRATE (PF) 100 MCG/2ML IJ SOLN
INTRAMUSCULAR | Status: AC
  Filled 2020-11-06: qty 2

## 2020-11-06 MED ORDER — ONDANSETRON HCL 4 MG/2ML IJ SOLN: 4.0000 mg | Freq: Once | INTRAMUSCULAR | Status: DC | PRN

## 2020-11-06 MED ORDER — ACETAMINOPHEN 10 MG/ML IV SOLN
INTRAVENOUS | Status: DC | PRN
  Administered 2020-11-06: 1000 mg via INTRAVENOUS

## 2020-11-06 MED ORDER — OXYCODONE HCL 5 MG PO TABS
ORAL_TABLET | ORAL | Status: AC
  Administered 2020-11-06: 5 mg via ORAL
  Filled 2020-11-06: qty 1

## 2020-11-06 MED ORDER — CEFAZOLIN SODIUM-DEXTROSE 2-4 GM/100ML-% IV SOLN
2.0000 g | INTRAVENOUS | Status: AC
  Administered 2020-11-06: 2 g via INTRAVENOUS

## 2020-11-06 MED ORDER — FENTANYL CITRATE (PF) 100 MCG/2ML IJ SOLN
INTRAMUSCULAR | Status: AC
  Administered 2020-11-06: 50 ug via INTRAVENOUS
  Filled 2020-11-06: qty 2

## 2020-11-06 MED ORDER — DEXAMETHASONE SODIUM PHOSPHATE 10 MG/ML IJ SOLN
INTRAMUSCULAR | Status: DC | PRN
  Administered 2020-11-06: 10 mg via INTRAVENOUS

## 2020-11-06 MED ORDER — ONDANSETRON HCL 4 MG/2ML IJ SOLN
INTRAMUSCULAR | Status: DC | PRN
  Administered 2020-11-06: 4 mg via INTRAVENOUS

## 2020-11-06 MED ORDER — EPHEDRINE SULFATE 50 MG/ML IJ SOLN
INTRAMUSCULAR | Status: DC | PRN
  Administered 2020-11-06: 10 mg via INTRAVENOUS
  Administered 2020-11-06: 15 mg via INTRAVENOUS
  Administered 2020-11-06: 10 mg via INTRAVENOUS

## 2020-11-06 MED ORDER — 0.9 % SODIUM CHLORIDE (POUR BTL) OPTIME
TOPICAL | Status: DC | PRN
  Administered 2020-11-06: 500 mL

## 2020-11-06 SURGICAL SUPPLY — 46 items
APL PRP STRL LF DISP 70% ISPRP (MISCELLANEOUS) ×2
BNDG COHESIVE 4X5 TAN ST LF (GAUZE/BANDAGES/DRESSINGS) ×2 IMPLANT
BNDG COHESIVE 6X5 TAN ST LF (GAUZE/BANDAGES/DRESSINGS) ×2 IMPLANT
BNDG ELASTIC 4X5.8 VLCR STR LF (GAUZE/BANDAGES/DRESSINGS) ×2 IMPLANT
BNDG ESMARK 4X12 TAN STRL LF (GAUZE/BANDAGES/DRESSINGS) IMPLANT
CANISTER SUCT 1200ML W/VALVE (MISCELLANEOUS) IMPLANT
CHLORAPREP W/TINT 26 (MISCELLANEOUS) ×4 IMPLANT
CUFF TOURN SGL QUICK 18X4 (TOURNIQUET CUFF) IMPLANT
CUFF TOURN SGL QUICK 24 (TOURNIQUET CUFF)
CUFF TRNQT CYL 24X4X16.5-23 (TOURNIQUET CUFF) IMPLANT
DRAPE FLUOR MINI C-ARM 54X84 (DRAPES) IMPLANT
DRAPE INCISE IOBAN 66X45 STRL (DRAPES) ×2 IMPLANT
DRAPE U-SHAPE 47X51 STRL (DRAPES) ×2 IMPLANT
DRSG OPSITE POSTOP 4X6 (GAUZE/BANDAGES/DRESSINGS) ×4 IMPLANT
DRSG TEGADERM 4X4.75 (GAUZE/BANDAGES/DRESSINGS) ×2 IMPLANT
ELECT CAUTERY BLADE 6.4 (BLADE) IMPLANT
ELECT REM PT RETURN 9FT ADLT (ELECTROSURGICAL) ×2
ELECTRODE REM PT RTRN 9FT ADLT (ELECTROSURGICAL) ×1 IMPLANT
GAUZE 4X4 16PLY ~~LOC~~+RFID DBL (SPONGE) IMPLANT
GAUZE SPONGE 4X4 12PLY STRL (GAUZE/BANDAGES/DRESSINGS) IMPLANT
GAUZE XEROFORM 1X8 LF (GAUZE/BANDAGES/DRESSINGS) ×2 IMPLANT
GLOVE SURG ENC MOIS LTX SZ8 (GLOVE) ×4 IMPLANT
GLOVE SURG UNDER LTX SZ8 (GLOVE) ×2 IMPLANT
GOWN STRL REUS W/ TWL LRG LVL3 (GOWN DISPOSABLE) ×2 IMPLANT
GOWN STRL REUS W/ TWL XL LVL3 (GOWN DISPOSABLE) ×1 IMPLANT
GOWN STRL REUS W/TWL LRG LVL3 (GOWN DISPOSABLE) ×4
GOWN STRL REUS W/TWL XL LVL3 (GOWN DISPOSABLE) ×2
GUIDEPIN VERSANAIL DSP 3.2X444 (ORTHOPEDIC DISPOSABLE SUPPLIES) ×2 IMPLANT
HFN LAG SCREW 10.5MM X 85MM (Screw) ×2 IMPLANT
KIT TURNOVER KIT A (KITS) ×2 IMPLANT
MANIFOLD NEPTUNE II (INSTRUMENTS) ×2 IMPLANT
NEEDLE FILTER BLUNT 18X 1/2SAF (NEEDLE)
NEEDLE FILTER BLUNT 18X1 1/2 (NEEDLE) IMPLANT
NEEDLE HYPO 22GX1.5 SAFETY (NEEDLE) ×2 IMPLANT
NS IRRIG 1000ML POUR BTL (IV SOLUTION) IMPLANT
NS IRRIG 500ML POUR BTL (IV SOLUTION) ×2 IMPLANT
PACK EXTREMITY ARMC (MISCELLANEOUS) ×2 IMPLANT
SPONGE T-LAP 18X18 ~~LOC~~+RFID (SPONGE) ×2 IMPLANT
STAPLER SKIN PROX 35W (STAPLE) ×2 IMPLANT
STOCKINETTE M/LG 89821 (MISCELLANEOUS) IMPLANT
SUT PROLENE 4 0 PS 2 18 (SUTURE) IMPLANT
SUT VIC AB 2-0 CT1 (SUTURE) ×6 IMPLANT
SUT VIC AB 2-0 SH 27 (SUTURE) ×4
SUT VIC AB 2-0 SH 27XBRD (SUTURE) ×2 IMPLANT
SYR 10ML LL (SYRINGE) ×2 IMPLANT
SYR 30ML LL (SYRINGE) ×2 IMPLANT

## 2020-11-06 NOTE — H&P (Signed)
History of Present Illness: Kelly Wood is a 80 y.o. who presents today for history and physical. She is to undergo exchange of a compression screw of right hip on 11/06/2020.  The patient was last seen in the clinic on 09/03/2020.  That has been no change in her condition since that time.  The patient has been complaining of lateral sided right hip pain secondary to presumed IT band irritation from the mildly prominent lag screw following a prior trochanteric femoral nailing of her intertrochanteric right hip fracture. The patient notes little change in her symptoms since being followed at [facility] clinic. She continues to experience intermittent pain in the lateral aspect of her hip which she rates as high as 5/10. She is not taking any medications for discomfort. Her symptoms are aggravated by any prolonged standing or ambulation, as well as when lying on her right side at night. She denies any reinjury to the hip, and denies any numbness or paresthesias running down her leg to her foot. Since her last visit, she has undergone an MRI scan of the right hip to evaluate for any other pathology that might be contributing to her symptoms   Past Medical History:  Cataract cortical, senile   GERD (gastroesophageal reflux disease)   Hyperlipidemia   Shingles   Thyroid disease   Past Surgical History:  CATARACT EXTRACTION Right   COLONOSCOPY 03/31/2006 ([place] Colon Polyps (Mother): [facility] 03/2011)   COLONOSCOPY 06/23/2016 ([place] Colon Polyps (Mother): [facility] 06/2021   Pneumothoraces x 2   Reduction and internal fixation of Right hip fracture 12/26/2018 (Dr. Roland Rack)   TUBAL LIGATION Bilateral   Past Family History:  Myocardial Infarction (Heart attack) Father   Hyperlipidemia (Elevated cholesterol) Brother   Colon polyps Mother   Colon cancer Neg Hx   Medications:  albuterol 90 mcg/actuation inhaler Inhale 2 inhalations into the lungs every 6 (six) hours as needed for Wheezing or Shortness of Breath VENTOLIN 18g 1  each 10   alendronate (FOSAMAX) 70 MG tablet Take 1 tablet (70 mg total) by mouth every 7 (seven) days 12 tablet 3   aspirin 81 MG EC tablet Take 81 mg by mouth once daily.   BIOTIN ORAL Take by mouth   bisoproloL-hydrochlorothiazide (ZIAC) 10-6.25 mg tablet Take 1 tablet by mouth once daily 90 tablet 3   diltiazem (CARDIZEM CD) 240 MG CD capsule Take 1 capsule (240 mg total) by mouth once daily 90 capsule 3   fluocinonide (LIDEX) 0.05 % gel   ibuprofen (MOTRIN) 200 MG tablet Take 200 mg by mouth every 6 (six) hours as needed for Pain   inosi/choline bit/vit B comp (LIPOTRIAD ONE-A-DAY ORAL) Take by mouth   Lactobacillus acidophilus (PROBIOTIC ORAL) Take by mouth   levothyroxine (SYNTHROID) 50 MCG tablet Take 1 tablet (50 mcg total) by mouth once daily Take on an empty stomach with a glass of water at least 30-60 minutes before breakfast. 90 tablet 1   MELATONIN ORAL Take by mouth   omeprazole/sodium bicarbonate (ZEGERID ORAL) Take by mouth   POTASSIUM ORAL Take by mouth   pravastatin (PRAVACHOL) 80 MG tablet Take 1 tablet (80 mg total) by mouth once daily 90 tablet 3   predniSONE (DELTASONE) 10 MG tablet Taper 6-5-4-3-2-1-off 21 tablet 0   TURMERIC ORAL Take by mouth   levothyroxine (SYNTHROID) 100 MCG tablet TAKE 1 TABLET ONCE DAILY ON AN EMPTY STOMACH AT LEAST 30-60 MINUTES BEFORE BREAKFAST WITH A GLASS OF WATER (Patient not taking: Reported on 11/04/2020) 90 tablet  3   Current Facility-Administered Medications:  cyanocobalamin (VITAMIN B12) injection 1,000 mcg 1,000 mcg Intramuscular Q30 Days Idelle Crouch, MD 1,000 mcg at 10/17/20 1107   Allergies:  Azithromycin Diarrhea   Levaquin [Levofloxacin] Other (Pt prefers not to take because og side effects)   Sulfa (Sulfonamide Antibiotics) Unknown    Review of Systems A comprehensive 14 point ROS was performed, reviewed, and the pertinent orthopaedic findings are documented in the HPI.  Physical Exam: BP 116/84 (BP Location: Left  upper arm, Patient Position: Sitting, BP Cuff Size: Adult)  Ht 152.4 cm (5')  Wt 59.2 kg (130 lb 9.6 oz)  BMI 25.51 kg/m   General: Well-developed well-nourished female seen in no acute distress.   HEENT: Atraumatic,normocephalic. Pupils are equal and reactive to light. Oropharynx is clear with moist mucosa  Lungs: Clear to auscultation bilaterally   Cardiovascular: Regular rate and rhythm. Normal S1, S2. No murmurs. No appreciable gallops or rubs. Peripheral pulses are palpable.  Abdomen: Soft, non-tender, nondistended. Bowel sounds present  Right hip exam: The patient ambulates with at most a minimal limp, favoring her right leg, but is not using any assistive devices.  She is able to arise from a seated position without difficulty.  In stance, her pelvis is level.  She is able to heel raise and toe raise without difficulty.  On inspection, her surgical incisions remain well-healed and without evidence for infection.  No swelling, erythema, ecchymosis, abrasions, or other skin abnormalities are identified.  There is mild-moderate focal tenderness palpation over the greater trochanteric region laterally, but there are no other areas of tenderness around her hip or thigh.  There is no pain with passive right hip range of motion.  She exhibits full range of motion bilaterally.  Neurological: The patient is alert and oriented Sensation to light touch appears to be intact and within normal limits Gross motor strength appeared to be equal to 5/5  Vascular : Peripheral pulses felt to be palpable. Capillary refill appears to be intact and within normal limits  X-ray: MRI of the pelvic and right hip by report demonstrates evidence of a persistent small partial fracture line medially without evidence for nonunion or recurrent fracture. The hardware itself appears to be in good position and without evidence of loosening. There is evidence of mild tendinopathy of the hamstring insertion on  the ischium, but no evidence for any tendinopathic changes involving the gluteus medius or minimus tendons.   Impression: Trochanteric bursitis right hip felt to be secondary to backing out of compression lag screw.  Plan:  1. Patient has already discontinued all anticoagulation medication as well as over-the-counter medications.   H&P reviewed and patient re-examined. No changes.

## 2020-11-06 NOTE — Discharge Instructions (Addendum)
Orthopedic discharge instructions: May shower with intact OpSite dressings. Apply ice frequently to hip/thigh region. Take ibuprofen 600 mg TID with meals for 5-7 days, then as necessary. Take pain medication as prescribed or ES Tylenol when needed.  May weight-bear as tolerated on right lower extremity - use crutches or walker as needed. Follow-up in 10-14 days or as scheduled.  AMBULATORY SURGERY  DISCHARGE INSTRUCTIONS   The drugs that you were given will stay in your system until tomorrow so for the next 24 hours you should not:  Drive an automobile Make any legal decisions Drink any alcoholic beverage   You may resume regular meals tomorrow.  Today it is better to start with liquids and gradually work up to solid foods.  You may eat anything you prefer, but it is better to start with liquids, then soup and crackers, and gradually work up to solid foods.   Please notify your doctor immediately if you have any unusual bleeding, trouble breathing, redness and pain at the surgery site, drainage, fever, or pain not relieved by medication.    Your post-operative visit with Dr.                                       is: Date:                        Time:    Please call to schedule your post-operative visit.  Additional Instructions:

## 2020-11-06 NOTE — Op Note (Signed)
11/06/2020  3:57 PM  Patient:   Kelly Wood  Pre-Op Diagnosis:   Painful retained lag screw status post trochanteric femoral nailing of displaced intertrochanteric fracture, right hip.  Post-Op Diagnosis:   Same  Procedure:   Lag screw exchange, right hip.  Surgeon:   Pascal Lux, MD  Assistant:   Samson Frederic, PA-S  Anesthesia:   General LMA  Findings:   As above.  Complications:   None  Fluids:   800 cc crystalloid  EBL:   10 cc  UOP:   None  TT:   None  Drains:   None  Closure:   Staples  Implants:   85 x 10.5 mm lag screw  Brief Clinical Note:   The patient is a 80 year old female who is now nearly 2 years status post a trochanteric femoral nailing of a displaced intertrochanteric right hip fracture. The patient was doing well until about 10 months ago when she began to experience some increased discomfort in the lateral aspect of her right hip. Her symptoms have persisted despite medications, activity modification, injections, etc. Her history and examination are consistent with a painful retained lag screw. X-rays demonstrated mild prominence of the screw laterally with excellent healing of the fracture. The patient presents at this time for an exchange to a shorter lag screw.  Procedure:   The patient was brought into the operating room and laid in the supine position. After adequate general laryngeal mask anesthesia was obtained, the patient was repositioned on the fracture table so that her right leg was placed in gentle longitudinal traction while the left leg was placed in a flexed and abducted position in the well-leg holder. The lateral aspect of the right hip and thigh were prepped with ChloraPrep solution before being draped sterilely. Preoperative antibiotics were administered. A timeout was performed to verify the appropriate surgical site.  Using the most proximal lateral incision, this incision was reopened and carried down through the subcutaneous  tissues to expose the IT band. There is a split the length of the incision to provide access to the tip of the greater trochanter. The retained trochanteric femoral nail was identified and dissected free of soft tissues. Using fluoroscopic guidance, the appropriate screwdriver was placed into the end of the trochanteric femoral nail and the lag screw locking bolt was loosened. The screwdriver was left in place.  The middle lateral incision was then reopened and carried down through the subcutaneous tissues. The IT band was incised along the length of the incision to provide access to the lateral aspect of the femur. The prominent end of the lag screw was identified and dissected clear of overlying soft tissue. A guidewire was introduced through the lag screw up into the femoral head before being advanced slightly to keep it from pulling out. The appropriate screwdriver was used to remove the lag screw. His length was verified and found to be 90 mm. Therefore, an 85 mm lag screw was selected and reinserted over the guidewire into the femoral head. The lag screw was advanced to within 10 mm of subchondral bone which proved satisfactory in minimizing the amount of screw remaining lateral to the femoral cortex. The locking bolt was then advanced and tightened securely. The adequacy of screw position was verified fluoroscopically in AP and lateral projections and found to be excellent.  Each incision was irrigated thoroughly with sterile saline solution using bulb irrigation before each wound was closed similarly. The IT band was reapproximated using 2-0 Vicryl  interrupted sutures before the subcutaneous tissues were closed using 2-0 Vicryl interrupted sutures. The skin was closed using staples. A total of 30 cc of 0.5% plain Sensorcaine was injected in and around the two incisions to help with postoperative analgesia. Sterile occlusive dressings were applied over the two incisions before the patient was awakened,  extubated, and returned to the recovery room in satisfactory condition after tolerating the procedure well.

## 2020-11-06 NOTE — Anesthesia Preprocedure Evaluation (Signed)
Anesthesia Evaluation  Patient identified by MRN, date of birth, ID band Patient awake    Reviewed: Allergy & Precautions, NPO status , Patient's Chart, lab work & pertinent test results  History of Anesthesia Complications Negative for: history of anesthetic complications  Airway Mallampati: III  TM Distance: >3 FB Neck ROM: Full    Dental no notable dental hx. (+) Teeth Intact   Pulmonary neg sleep apnea, COPD,  COPD inhaler, Patient abstained from smoking.Not current smoker, former smoker,    Pulmonary exam normal breath sounds clear to auscultation       Cardiovascular Exercise Tolerance: Good METShypertension, Pt. on medications (-) CAD and (-) Past MI (-) dysrhythmias (tachycardia) (-) Valvular Problems/Murmurs Rhythm:Regular Rate:Normal - Systolic murmurs    Neuro/Psych neg Seizures negative neurological ROS  negative psych ROS   GI/Hepatic Neg liver ROS, GERD  Medicated and Controlled,  Endo/Other  neg diabetesHypothyroidism   Renal/GU negative Renal ROS     Musculoskeletal   Abdominal   Peds  Hematology  (+) anemia ,   Anesthesia Other Findings Past Medical History: No date: Anemia No date: COPD (chronic obstructive pulmonary disease) (HCC) No date: Dyspnea No date: GERD (gastroesophageal reflux disease) No date: Hyperlipidemia No date: Hypertension No date: Hypothyroidism  Reproductive/Obstetrics                             Anesthesia Physical  Anesthesia Plan  ASA: 3  Anesthesia Plan: General/Spinal   Post-op Pain Management:    Induction: Intravenous  PONV Risk Score and Plan: 3 and Ondansetron, Dexamethasone, Propofol infusion, TIVA and Treatment may vary due to age or medical condition  Airway Management Planned: Natural Airway and Oral ETT  Additional Equipment: None  Intra-op Plan:   Post-operative Plan:   Informed Consent: I have reviewed the patients  History and Physical, chart, labs and discussed the procedure including the risks, benefits and alternatives for the proposed anesthesia with the patient or authorized representative who has indicated his/her understanding and acceptance.       Plan Discussed with: CRNA and Surgeon  Anesthesia Plan Comments: (Spinal vs GETA, depending on discussion with surgeon. Discussed R/B/A of neuraxial anesthesia technique with patient: - rare risks of spinal/epidural hematoma, nerve damage, infection - Risk of PDPH - Risk of nausea and vomiting - Risk of conversion to general anesthesia and its associated risks, including sore throat, damage to lips/teeth/oropharynx, and rare risks such as cardiac and respiratory events. - Risk of allergic reactions  Patient voiced understanding.)        Anesthesia Quick Evaluation

## 2020-11-06 NOTE — Anesthesia Procedure Notes (Signed)
Procedure Name: LMA Insertion Date/Time: 11/06/2020 2:39 PM Performed by: Jerrye Noble, CRNA Pre-anesthesia Checklist: Patient identified, Emergency Drugs available, Suction available and Patient being monitored Patient Re-evaluated:Patient Re-evaluated prior to induction Oxygen Delivery Method: Circle system utilized Preoxygenation: Pre-oxygenation with 100% oxygen Induction Type: IV induction Ventilation: Mask ventilation without difficulty LMA: LMA inserted LMA Size: 3.0 Number of attempts: 2 Placement Confirmation: positive ETCO2 and breath sounds checked- equal and bilateral Tube secured with: Tape Dental Injury: Teeth and Oropharynx as per pre-operative assessment

## 2020-11-06 NOTE — Transfer of Care (Signed)
Immediate Anesthesia Transfer of Care Note  Patient: Kelly Wood  Procedure(s) Performed: EXCHANGE OF LAG SCREW, RIGHT HIP (Right: Hip)  Patient Location: PACU  Anesthesia Type:General  Level of Consciousness: drowsy and patient cooperative  Airway & Oxygen Therapy: Patient Spontanous Breathing and Patient connected to face mask oxygen  Post-op Assessment: Report given to RN and Post -op Vital signs reviewed and stable  Post vital signs: Reviewed and stable  Last Vitals:  Vitals Value Taken Time  BP 127/67 11/06/20 1556  Temp    Pulse 56 11/06/20 1558  Resp 10 11/06/20 1558  SpO2 100 % 11/06/20 1558  Vitals shown include unvalidated device data.  Last Pain:  Vitals:   11/06/20 1150  TempSrc: Temporal  PainSc: 0-No pain         Complications: No notable events documented.

## 2020-11-07 ENCOUNTER — Encounter: Payer: Self-pay | Admitting: Surgery

## 2020-11-07 NOTE — Anesthesia Postprocedure Evaluation (Signed)
Anesthesia Post Note  Patient: Kelly Wood  Procedure(s) Performed: EXCHANGE OF LAG SCREW, RIGHT HIP (Right: Hip)  Patient location during evaluation: PACU Anesthesia Type: General Level of consciousness: awake and alert Pain management: pain level controlled Vital Signs Assessment: post-procedure vital signs reviewed and stable Respiratory status: spontaneous breathing, nonlabored ventilation, respiratory function stable and patient connected to nasal cannula oxygen Cardiovascular status: blood pressure returned to baseline and stable Postop Assessment: no apparent nausea or vomiting Anesthetic complications: no   No notable events documented.   Last Vitals:  Vitals:   11/06/20 1652 11/06/20 1706  BP: (!) 156/72 (!) 166/76  Pulse: (!) 58 60  Resp: 20 18  Temp: (!) 36.1 C   SpO2: 97% 98%    Last Pain:  Vitals:   11/06/20 1652  TempSrc: Temporal  PainSc: 0-No pain                 Arita Miss

## 2020-12-24 ENCOUNTER — Telehealth: Payer: Self-pay | Admitting: *Deleted

## 2020-12-24 NOTE — Telephone Encounter (Signed)
Spoke with the patient and let her know she needs to have her Upper Endoscopy completed prior to following up with with Dr Dahlia Byes for her hiatal hernia. She will call us back once she has this date to schedule her follow up with Dr Dahlia Byes for after the Upper Endoscopy.

## 2020-12-24 NOTE — Telephone Encounter (Signed)
Patient called and wants to know when she is suppose to come see Dr. Dahlia Byes. She is in recalls for November but she thought it was suppose to come to see him in October. Patient is very confused. Please call and advise

## 2021-01-05 ENCOUNTER — Ambulatory Visit
Admission: RE | Admit: 2021-01-05 | Discharge: 2021-01-05 | Disposition: A | Discharge status: Home / Self Care | Payer: Medicare HMO | Source: Ambulatory Visit | Attending: Internal Medicine | Admitting: Internal Medicine

## 2021-01-05 ENCOUNTER — Other Ambulatory Visit: Payer: Self-pay

## 2021-01-05 DIAGNOSIS — Z1231 Encounter for screening mammogram for malignant neoplasm of breast: Secondary | ICD-10-CM | POA: Diagnosis not present

## 2021-03-02 ENCOUNTER — Ambulatory Visit: Payer: Medicare HMO | Admitting: Surgery

## 2021-03-03 HISTORY — PX: ESOPHAGOGASTRODUODENOSCOPY: SHX1529

## 2021-03-09 ENCOUNTER — Telehealth: Payer: Self-pay | Admitting: Surgery

## 2021-03-09 ENCOUNTER — Encounter: Payer: Self-pay | Admitting: Surgery

## 2021-03-09 ENCOUNTER — Other Ambulatory Visit: Payer: Self-pay

## 2021-03-09 ENCOUNTER — Telehealth: Payer: Self-pay

## 2021-03-09 ENCOUNTER — Ambulatory Visit: Payer: Medicare HMO | Admitting: Surgery

## 2021-03-09 VITALS — BP 138/66 | HR 44 | Temp 97.0°F | Ht 60.0 in | Wt 136.0 lb

## 2021-03-09 DIAGNOSIS — R053 Chronic cough: Secondary | ICD-10-CM | POA: Diagnosis not present

## 2021-03-09 DIAGNOSIS — K449 Diaphragmatic hernia without obstruction or gangrene: Secondary | ICD-10-CM | POA: Diagnosis not present

## 2021-03-09 NOTE — Progress Notes (Signed)
Outpatient Surgical Follow Up  03/09/2021  Kelly Wood is an 80 y.o. female.   Chief Complaint  Patient presents with   Follow-up    Paraesophageal hernia    HPI: Kelly Wood is a 80 y.o. female following up for a paraesophageal hernia.  SHe has  pulmonary issues with history of COPD.  SHe also had a prior pneumothorax more than 30 years ago on the right side that required chest tube placement.  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  There is no evidence of incarceration or strangulation.  She does express reflux that has improved with PPI.  He denies any dysphagia.  She also reports some chronic nausea and indigestion. EGD and barium swallow pers. reviewed. Type III PE hernia.  She does have 2/3 of her stomach within the mediastinum. 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 CLnincally she denies dysphagia.   Past Medical History:  Diagnosis Date   Anemia    COPD (chronic obstructive pulmonary disease) (HCC)    Dyspnea    GERD (gastroesophageal reflux disease)    Hyperlipidemia    Hypertension    Hypothyroidism     Past Surgical History:  Procedure Laterality Date   COLONOSCOPY N/A 06/23/2016   Procedure: COLONOSCOPY;  Surgeon: Manya Silvas, MD;  Location: [facility];  Service: Endoscopy;  Laterality: N/A;   FEMUR FRACTURE SURGERY Right 02/07/2019   rod placed   HARDWARE REMOVAL Right 11/06/2020   Procedure: EXCHANGE OF LAG SCREW, RIGHT HIP;  Surgeon: Corky Mull, MD;  Location: [facility];  Service: Orthopedics;  Laterality: Right;   INTRAMEDULLARY (IM) NAIL INTERTROCHANTERIC Right 12/26/2018   Procedure: INTRAMEDULLARY (IM) NAIL INTERTROCHANTRIC;  Surgeon: Corky Mull, MD;  Location: [facility];   Service: Orthopedics;  Laterality: Right;   TUBAL LIGATION  1970    Family History  Problem Relation Age of Onset   Breast cancer Neg Hx     Social History:  reports that she quit smoking about 14 years ago. Her smoking use included cigarettes. She has never used smokeless tobacco. She reports current alcohol use of about 1.0 standard drink per week. She reports that she does not use drugs.  Allergies:  Allergies  Allergen Reactions   Azithromycin Diarrhea   Levofloxacin Diarrhea    Pt prefers not to take because gi side effects   Sulfa Antibiotics Rash    Medications reviewed.    ROS Full ROS performed and is otherwise negative other than what is stated in HPI   BP 138/66   Pulse (!) 44   Temp (!) 97 F (36.1 C) (Oral)   Ht 5' (1.524 m)   Wt 136 lb (61.7 kg)   SpO2 97%   BMI 26.56 kg/m   Physical Exam Vitals and nursing note reviewed. Exam conducted with a chaperone present.  Constitutional:      General: She is not in acute distress.    Appearance: Normal appearance. She is normal weight. She is not toxic-appearing.  Eyes:     General: No scleral icterus.       Right eye: No discharge.        Left eye: No discharge.  Cardiovascular:     Rate and Rhythm: Normal rate and regular rhythm.  Heart sounds: No murmur heard. Pulmonary:     Effort: Pulmonary effort is normal. No respiratory distress.     Breath sounds: Normal breath sounds. No stridor. No wheezing or rhonchi.  Abdominal:     General: Abdomen is flat. There is no distension.     Palpations: Abdomen is soft. There is no mass.     Tenderness: There is no abdominal tenderness. There is no guarding or rebound.     Hernia: No hernia is present.  Musculoskeletal:     Cervical back: Normal range of motion and neck supple. No rigidity or tenderness.  Lymphadenopathy:     Cervical: No cervical adenopathy.  Skin:    General: Skin is warm and dry.     Capillary Refill: Capillary refill takes less than 2  seconds.  Neurological:     General: No focal deficit present.     Mental Status: She is alert and oriented to person, place, and time.  Psychiatric:        Mood and Affect: Mood normal.        Behavior: Behavior normal.        Thought Content: Thought content normal.        Judgment: Judgment normal.       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  Given her symptoms and chronic cough I definitely recommend repair.  I do think that she will be a good candidate for robotic approach.  Procedure discussed with the patient detail.  Risks, benefits, possible complications including but not limited to, bleeding, infection esophageal injuries, chronic cough, recurrence she understands and wishes to proceed Plan For Robotic paraesophageal w fundoplication 099 vs [phone]. Please note that I spent greater than 45 minutes in this encounter including personally reviewing imaging studies, placing orders, coordinating care care, counseling the patient and performing appropriate documentation  [name], MD [facility] Surgeon

## 2021-03-09 NOTE — Telephone Encounter (Signed)
Patient has been advised of Pre-Admission date/time, COVID Testing date and Surgery date.  Surgery Date: 04/07/21 Preadmission Testing Date: 03/26/21 (phone 8a-1p) Covid Testing Date: 04/03/21@ 8:10 am  - patient advised to go to the [facility] between 8a-12:00 pm  Patient has been made aware to call [phone], between 1-3:00pm the day before surgery, to find out what time to arrive for surgery.

## 2021-03-09 NOTE — Telephone Encounter (Signed)
Faxed medical clearance to Dr. Fulton Reek at [phone].

## 2021-03-09 NOTE — Patient Instructions (Signed)
Our surgery scheduler Pamala Hurry will call you within 24-48 hours to get you scheduled. If you have not heard from her after 48 hours, please call our office. You will need to get Covid tested before surgery and have the blue sheet available when she calls to write down important information.  If you have any concerns or questions, please feel free to call our office.  Laparoscopic Nissen Fundoplication, Care After  The following information offers guidance on how to care for yourself after your procedure. Your health care provider may also give you more specific instructions. If you have problems or questions, contact your health care provider. What can I expect after the procedure? After the procedure, it is common to have: Trouble swallowing (dysphagia). Discomfort when you swallow. Soreness in your abdomen. Bloating. Follow these instructions at home: Medicines Take over-the-counter and prescription medicines only as told by your health care provider. Ask your health care provider or pharmacist if you can crush any pill that you are taking. Take only liquid medicines as told. Ask your health care provider if the medicine prescribed to you requires you to avoid driving or using machinery. Incision care  Follow instructions from your health care provider about how to take care of your incisions. Make sure you: Wash your hands with soap and water for at least 20 seconds before and after you change your bandage (dressing). If soap and water are not available, use hand sanitizer. Change your dressing as told by your health care provider. Leave stitches (sutures), skin glue, or adhesive strips in place. These skin closures may need to stay in place for 2 weeks or longer. If adhesive strip edges start to loosen and curl up, you may trim the loose edges. Do not remove adhesive strips completely unless your health care provider tells you to do that. Check your incision area every day for signs of  infection. Check for: Redness, swelling, or pain. Fluid or blood. Warmth. Pus or a bad smell. Eating and drinking  Follow instructions from your health care provider about eating or drinking restrictions. Follow these instructions carefully. You may need to follow a liquid-only diet for 2 weeks, followed by a diet of soft foods for 2 weeks. You should eat slow, take small bites, and chew food carefully. You should eat or drink in an upright position. You should return to your usual diet gradually. Drink enough fluid to keep your urine pale yellow. Activity  If you were given a sedative during the procedure, it can affect you for several hours. Do not drive or operate machinery until your health care provider says that it is safe. Rest as told by your health care provider. Avoid sitting for a long time without moving. Get up to take short walks every 1-2 hours. This is important to improve blood flow and breathing. Do not lift anything that is heavier than 10 lb (4.5 kg), or the limit that you are told, until your health care provider says that it is safe. Avoid activities that take a lot of effort. Ask your health care provider what activities are safe for you. Ask when you can: Return to sexual activity. Drive. Go back to work. Return to your normal activities as told by your health care provider. General instructions Do not take baths, swim, or use a hot tub until your health care provider approves. Ask your health care provider if you may take showers. You may only be allowed to take sponge baths. Do not use any  products that contain nicotine or tobacco. These products include cigarettes, chewing tobacco, and vaping devices, such as e-cigarettes. These can delay healing. If you need help quitting, ask your health care provider. Keep all follow-up visits. This is important. Contact a health care provider if: You have chills or a fever. You have trouble swallowing. You have painful  bloating. You have persistent heartburn. You have pain that does not go away with medicine. You have frequent nausea or vomiting. Your incision opens up. You have any of these signs of infection: Redness, swelling, or pain around your incision. Fluid or blood coming from your incision. Warmth coming from your incision. Pus or a bad smell coming from your incision. A fever. Get help right away if: You have severe pain or severe bloating. You are unable to swallow. You have vomiting that does not stop. You have blood in your vomit. You have trouble breathing. These symptoms may represent a serious problem that is an emergency. Do not wait to see if the symptoms will go away. Get medical help right away. Call your local emergency services (911 in the [place] Do not drive yourself to the hospital. Summary After the procedure, it is common to have trouble swallowing or have discomfort when you swallow. Follow instructions from your health care provider about eating or drinking restrictions. You may need to follow a liquid-only diet for 2 weeks, followed by a diet of soft foods for 2 weeks. Return to your normal activities as told by your health care provider. Keep all follow-up visits as told by your health care provider. This is important. This information is not intended to replace advice given to you by your health care provider. Make sure you discuss any questions you have with your health care provider. Document Revised: 10/07/2019 Document Reviewed: 10/07/2019 Elsevier Patient Education  [place].

## 2021-03-09 NOTE — H&P (View-Only) (Signed)
Outpatient Surgical Follow Up  03/09/2021  Kelly Wood is an 80 y.o. female.   Chief Complaint  Patient presents with   Follow-up    Paraesophageal hernia    HPI: Kelly Wood is a 80 y.o. female following up for a paraesophageal hernia.  SHe has  pulmonary issues with history of COPD.  SHe also had a prior pneumothorax more than 30 years ago on the right side that required chest tube placement.  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  There is no evidence of incarceration or strangulation.  She does express reflux that has improved with PPI.  He denies any dysphagia.  She also reports some chronic nausea and indigestion. EGD and barium swallow pers. reviewed. Type III PE hernia.  She does have 2/3 of her stomach within the mediastinum. 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 CLnincally she denies dysphagia.   Past Medical History:  Diagnosis Date   Anemia    COPD (chronic obstructive pulmonary disease) (HCC)    Dyspnea    GERD (gastroesophageal reflux disease)    Hyperlipidemia    Hypertension    Hypothyroidism     Past Surgical History:  Procedure Laterality Date   COLONOSCOPY N/A 06/23/2016   Procedure: COLONOSCOPY;  Surgeon: Manya Silvas, MD;  Location: [facility];  Service: Endoscopy;  Laterality: N/A;   FEMUR FRACTURE SURGERY Right 02/07/2019   rod placed   HARDWARE REMOVAL Right 11/06/2020   Procedure: EXCHANGE OF LAG SCREW, RIGHT HIP;  Surgeon: Corky Mull, MD;  Location: [facility];  Service: Orthopedics;  Laterality: Right;   INTRAMEDULLARY (IM) NAIL INTERTROCHANTERIC Right 12/26/2018   Procedure: INTRAMEDULLARY (IM) NAIL INTERTROCHANTRIC;  Surgeon: Corky Mull, MD;  Location: [facility];   Service: Orthopedics;  Laterality: Right;   TUBAL LIGATION  1970    Family History  Problem Relation Age of Onset   Breast cancer Neg Hx     Social History:  reports that she quit smoking about 14 years ago. Her smoking use included cigarettes. She has never used smokeless tobacco. She reports current alcohol use of about 1.0 standard drink per week. She reports that she does not use drugs.  Allergies:  Allergies  Allergen Reactions   Azithromycin Diarrhea   Levofloxacin Diarrhea    Pt prefers not to take because gi side effects   Sulfa Antibiotics Rash    Medications reviewed.    ROS Full ROS performed and is otherwise negative other than what is stated in HPI   BP 138/66    Pulse (!) 44    Temp (!) 97 F (36.1 C) (Oral)    Ht 5' (1.524 m)    Wt 136 lb (61.7 kg)    SpO2 97%    BMI 26.56 kg/m   Physical Exam Vitals and nursing note reviewed. Exam conducted with a chaperone present.  Constitutional:      General: She is not in acute distress.    Appearance: Normal appearance. She is normal weight. She is not toxic-appearing.  Eyes:     General: No scleral icterus.       Right eye: No discharge.        Left eye: No discharge.  Cardiovascular:     Rate and Rhythm: Normal  rate and regular rhythm.     Heart sounds: No murmur heard. Pulmonary:     Effort: Pulmonary effort is normal. No respiratory distress.     Breath sounds: Normal breath sounds. No stridor. No wheezing or rhonchi.  Abdominal:     General: Abdomen is flat. There is no distension.     Palpations: Abdomen is soft. There is no mass.     Tenderness: There is no abdominal tenderness. There is no guarding or rebound.     Hernia: No hernia is present.  Musculoskeletal:     Cervical back: Normal range of motion and neck supple. No rigidity or tenderness.  Lymphadenopathy:     Cervical: No cervical adenopathy.  Skin:    General: Skin is warm and dry.     Capillary Refill: Capillary refill takes less than 2  seconds.  Neurological:     General: No focal deficit present.     Mental Status: She is alert and oriented to person, place, and time.  Psychiatric:        Mood and Affect: Mood normal.        Behavior: Behavior normal.        Thought Content: Thought content normal.        Judgment: Judgment normal.       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  Given her symptoms and chronic cough I definitely recommend repair.  I do think that she will be a good candidate for robotic approach.  Procedure discussed with the patient detail.  Risks, benefits, possible complications including but not limited to, bleeding, infection esophageal injuries, chronic cough, recurrence she understands and wishes to proceed Plan For Robotic paraesophageal w fundoplication [id number] vs [id number]. Please note that I spent greater than 45 minutes in this encounter including personally reviewing imaging studies, placing orders, coordinating care care, counseling the patient and performing appropriate documentation  [name], MD [facility] Surgeon

## 2021-03-10 ENCOUNTER — Telehealth: Payer: Self-pay

## 2021-03-10 NOTE — Telephone Encounter (Signed)
Received medical clearance from Dr. Doy Hutching. Pt's procedure risk assessment is Medium. This pt is optimized for surgery.

## 2021-03-26 ENCOUNTER — Other Ambulatory Visit: Payer: Self-pay

## 2021-03-26 ENCOUNTER — Other Ambulatory Visit
Admission: RE | Admit: 2021-03-26 | Discharge: 2021-03-26 | Disposition: A | Discharge status: Home / Self Care | Payer: Medicare HMO | Source: Ambulatory Visit | Attending: Surgery | Admitting: Surgery

## 2021-03-26 HISTORY — DX: Malignant (primary) neoplasm, unspecified: C80.1

## 2021-03-26 NOTE — Patient Instructions (Addendum)
Your procedure is scheduled on: 04/07/21 - Tuesday Report to the Registration Desk on the 1st floor of the [facility]. To find out your arrival time, please call [phone] between 1PM - 3PM on: 04/03/21 - Friday Report to [facility] for Covid Test 04/03/21 at 9 am.  REMEMBER: Instructions that are not followed completely may result in serious medical risk, up to and including death; or upon the discretion of your surgeon and anesthesiologist your surgery may need to be rescheduled.  Do not eat food after midnight the night before surgery.  No gum chewing, lozengers or hard candies.  You may however, drink CLEAR liquids up to 2 hours before you are scheduled to arrive for your surgery. Do not drink anything within 2 hours of your scheduled arrival time.  Clear liquids include: - water  - apple juice without pulp - gatorade (not RED, PURPLE, OR BLUE) - black coffee or tea (Do NOT add milk or creamers to the coffee or tea) Do NOT drink anything that is not on this list.  TAKE THESE MEDICATIONS THE MORNING OF SURGERY WITH A SIP OF WATER:  - levothyroxine (SYNTHROID) 75 MCG tablet - Omeprazole-Sodium Bicarbonate (ZEGERID OTC PO), (take one the night before and one on the morning of surgery - helps to prevent nausea after surgery.)   Use albuterol (VENTOLIN HFA) 108 (90 Base) MCG/ACT inhaler on the day of surgery and bring to the hospital.  Follow recommendations from Cardiologist, Pulmonologist or PCP regarding stopping Aspirin, Coumadin, Plavix, Eliquis, Pradaxa, or Pletal. Stop Aspirin 81 mg 03/31/21.  Bring these Medications the day of surgery in the original prescription bottles:  - pravastatin (PRAVACHOL) 80 MG tablet - diltiazem (CARDIZEM CD) 240 MG 24 hr capsule  One week prior to surgery: Stop Anti-inflammatories (NSAIDS) such as Advil, Aleve, Ibuprofen, Motrin, Naproxen, Naprosyn and Aspirin based products such as Excedrin, Goodys Powder, BC Powder.  Stop ANY OVER  THE COUNTER supplements until after surgery : Turmeric , Multiple Vitamin , Potassium  You may however, continue to take Tylenol if needed for pain up until the day of surgery.  No Alcohol for 24 hours before or after surgery.  No Smoking including e-cigarettes for 24 hours prior to surgery.  No chewable tobacco products for at least 6 hours prior to surgery.  No nicotine patches on the day of surgery.  Do not use any "recreational" drugs for at least a week prior to your surgery.  Please be advised that the combination of cocaine and anesthesia may have negative outcomes, up to and including death. If you test positive for cocaine, your surgery will be cancelled.  On the morning of surgery brush your teeth with toothpaste and water, you may rinse your mouth with mouthwash if you wish. Do not swallow any toothpaste or mouthwash.  Use CHG Soap or wipes as directed on instruction sheet.  Do not wear jewelry, make-up, hairpins, clips or nail polish.  Do not wear lotions, powders, or perfumes.   Do not shave body from the neck down 48 hours prior to surgery just in case you cut yourself which could leave a site for infection.  Also, freshly shaved skin may become irritated if using the CHG soap.  Contact lenses, hearing aids and dentures may not be worn into surgery.  Do not bring valuables to the hospital. [facility] is not responsible for any missing/lost belongings or valuables.   Notify your doctor if there is any change in your medical condition (cold,  fever, infection).  Wear comfortable clothing (specific to your surgery type) to the hospital.  After surgery, you can help prevent lung complications by doing breathing exercises.  Take deep breaths and cough every 1-2 hours. Your doctor may order a device called an Incentive Spirometer to help you take deep breaths. When coughing or sneezing, hold a pillow firmly against your incision with both hands. This is called splinting.  Doing this helps protect your incision. It also decreases belly discomfort.  If you are being admitted to the hospital overnight, leave your suitcase in the car. After surgery it may be brought to your room.  If you are being discharged the day of surgery, you will not be allowed to drive home. You will need a responsible adult (18 years or older) to drive you home and stay with you that night.   If you are taking public transportation, you will need to have a responsible adult (18 years or older) with you. Please confirm with your physician that it is acceptable to use public transportation.   Please call the [place] [facility] at [phone] if you have any questions about these instructions.  Surgery Visitation Policy:  Patients undergoing a surgery or procedure may have one family member or support person with them as long as that person is not COVID-19 positive or experiencing its symptoms.  That person may remain in the waiting area during the procedure and may rotate out with other people.  Inpatient Visitation:    Visiting hours are 7 a.m. to 8 p.m. Up to two visitors ages 16+ are allowed at one time in a patient room. The visitors may rotate out with other people during the day. Visitors must check out when they leave, or other visitors will not be allowed. One designated support person may remain overnight. The visitor must pass COVID-19 screenings, use hand sanitizer when entering and exiting the patients room and wear a mask at all times, including in the patients room. Patients must also wear a mask when staff or their visitor are in the room. Masking is required regardless of vaccination status.

## 2021-04-03 ENCOUNTER — Other Ambulatory Visit: Payer: Self-pay

## 2021-04-03 ENCOUNTER — Other Ambulatory Visit
Admission: RE | Admit: 2021-04-03 | Discharge: 2021-04-03 | Disposition: A | Discharge status: Home / Self Care | Payer: Medicare HMO | Source: Ambulatory Visit | Attending: Surgery | Admitting: Surgery

## 2021-04-03 DIAGNOSIS — Z1152 Encounter for screening for COVID-19: Secondary | ICD-10-CM | POA: Diagnosis present

## 2021-04-03 DIAGNOSIS — Z20822 Contact with and (suspected) exposure to covid-19: Secondary | ICD-10-CM | POA: Diagnosis not present

## 2021-04-04 LAB — SARS CORONAVIRUS 2 (TAT 6-24 HRS): SARS Coronavirus 2: NEGATIVE

## 2021-04-07 ENCOUNTER — Encounter: Payer: Self-pay | Admitting: Surgery

## 2021-04-07 ENCOUNTER — Encounter
Admission: RE | Disposition: A | Discharge status: Home / Self Care | Payer: Self-pay | Source: Home / Self Care | Attending: Surgery

## 2021-04-07 ENCOUNTER — Ambulatory Visit: Payer: Medicare HMO | Admitting: Certified Registered"

## 2021-04-07 ENCOUNTER — Other Ambulatory Visit: Payer: Self-pay

## 2021-04-07 ENCOUNTER — Observation Stay
Admission: RE | Admit: 2021-04-07 | Discharge: 2021-04-08 | Disposition: A | Discharge status: Home / Self Care | Payer: Medicare HMO | Attending: Surgery | Admitting: Surgery

## 2021-04-07 DIAGNOSIS — E039 Hypothyroidism, unspecified: Secondary | ICD-10-CM | POA: Insufficient documentation

## 2021-04-07 DIAGNOSIS — J449 Chronic obstructive pulmonary disease, unspecified: Secondary | ICD-10-CM | POA: Insufficient documentation

## 2021-04-07 DIAGNOSIS — I10 Essential (primary) hypertension: Secondary | ICD-10-CM | POA: Diagnosis not present

## 2021-04-07 DIAGNOSIS — Z87891 Personal history of nicotine dependence: Secondary | ICD-10-CM | POA: Insufficient documentation

## 2021-04-07 DIAGNOSIS — K449 Diaphragmatic hernia without obstruction or gangrene: Secondary | ICD-10-CM | POA: Diagnosis not present

## 2021-04-07 DIAGNOSIS — Z8719 Personal history of other diseases of the digestive system: Secondary | ICD-10-CM

## 2021-04-07 HISTORY — PX: INSERTION OF MESH: SHX5868

## 2021-04-07 HISTORY — PX: XI ROBOTIC ASSISTED PARAESOPHAGEAL HERNIA REPAIR: SHX6871

## 2021-04-07 HISTORY — PX: LAPAROSCOPIC NISSEN FUNDOPLICATION: SHX1932

## 2021-04-07 LAB — CBC
HCT: 36.7 % (ref 36.0–46.0)
Hemoglobin: 11.8 g/dL — ABNORMAL LOW (ref 12.0–15.0)
MCH: 28.4 pg (ref 26.0–34.0)
MCHC: 32.2 g/dL (ref 30.0–36.0)
MCV: 88.4 fL (ref 80.0–100.0)
Platelets: 206 10*3/uL (ref 150–400)
RBC: 4.15 MIL/uL (ref 3.87–5.11)
RDW: 14.2 % (ref 11.5–15.5)
WBC: 13.6 10*3/uL — ABNORMAL HIGH (ref 4.0–10.5)
nRBC: 0 % (ref 0.0–0.2)

## 2021-04-07 LAB — CREATININE, SERUM
Creatinine, Ser: 0.69 mg/dL (ref 0.44–1.00)
GFR, Estimated: >60 mL/min (ref >60)

## 2021-04-07 SURGERY — REPAIR, HERNIA, PARAESOPHAGEAL, ROBOT-ASSISTED
Anesthesia: General

## 2021-04-07 MED ORDER — PREGABALIN 50 MG PO CAPS
50.0000 mg | ORAL_CAPSULE | Freq: Three times a day (TID) | ORAL | Status: DC
  Administered 2021-04-08: 50 mg via ORAL

## 2021-04-07 MED ORDER — ACETAMINOPHEN 10 MG/ML IV SOLN: 1000.0000 mg | Freq: Once | INTRAVENOUS | Status: DC | PRN

## 2021-04-07 MED ORDER — HYDRALAZINE HCL 20 MG/ML IJ SOLN: 10.0000 mg | INTRAMUSCULAR | Status: DC | PRN

## 2021-04-07 MED ORDER — FENTANYL CITRATE (PF) 100 MCG/2ML IJ SOLN
INTRAMUSCULAR | Status: AC
  Administered 2021-04-07: 25 ug via INTRAVENOUS
  Filled 2021-04-07: qty 2

## 2021-04-07 MED ORDER — LIDOCAINE HCL (CARDIAC) PF 100 MG/5ML IV SOSY
PREFILLED_SYRINGE | INTRAVENOUS | Status: DC | PRN
  Administered 2021-04-07: 60 mg via INTRAVENOUS

## 2021-04-07 MED ORDER — ROCURONIUM BROMIDE 100 MG/10ML IV SOLN
INTRAVENOUS | Status: DC | PRN
  Administered 2021-04-07: 40 mg via INTRAVENOUS
  Administered 2021-04-07: 20 mg via INTRAVENOUS

## 2021-04-07 MED ORDER — BISOPROLOL-HYDROCHLOROTHIAZIDE 10-6.25 MG PO TABS
1.0000 | ORAL_TABLET | Freq: Every day | ORAL | Status: DC
  Administered 2021-04-08: 1 via ORAL
  Filled 2021-04-07: qty 1

## 2021-04-07 MED ORDER — ACETAMINOPHEN 500 MG PO TABS
ORAL_TABLET | ORAL | Status: AC
  Administered 2021-04-07: 1000 mg via ORAL
  Filled 2021-04-07: qty 2

## 2021-04-07 MED ORDER — TURMERIC 500 MG PO TABS: 500.0000 mg | ORAL_TABLET | Freq: Every day | ORAL | Status: DC

## 2021-04-07 MED ORDER — ACETAMINOPHEN 500 MG PO TABS
ORAL_TABLET | ORAL | Status: AC
  Filled 2021-04-07: qty 2

## 2021-04-07 MED ORDER — CELECOXIB 200 MG PO CAPS
ORAL_CAPSULE | ORAL | Status: AC
  Administered 2021-04-07: 200 mg via ORAL
  Filled 2021-04-07: qty 1

## 2021-04-07 MED ORDER — ENOXAPARIN SODIUM 40 MG/0.4ML IJ SOSY
40.0000 mg | PREFILLED_SYRINGE | INTRAMUSCULAR | Status: DC
  Administered 2021-04-08: 40 mg via SUBCUTANEOUS

## 2021-04-07 MED ORDER — KETOROLAC TROMETHAMINE 15 MG/ML IJ SOLN
INTRAMUSCULAR | Status: AC
  Filled 2021-04-07: qty 1

## 2021-04-07 MED ORDER — PREGABALIN 50 MG PO CAPS
ORAL_CAPSULE | ORAL | Status: AC
  Administered 2021-04-07: 50 mg via ORAL
  Filled 2021-04-07: qty 1

## 2021-04-07 MED ORDER — BUPIVACAINE LIPOSOME 1.3 % IJ SUSP
INTRAMUSCULAR | Status: DC | PRN
  Administered 2021-04-07: 20 mL

## 2021-04-07 MED ORDER — FENTANYL CITRATE (PF) 100 MCG/2ML IJ SOLN
INTRAMUSCULAR | Status: DC | PRN
Start: 2021-04-07 — End: 2021-04-07
  Administered 2021-04-07 (×2): 50 ug via INTRAVENOUS

## 2021-04-07 MED ORDER — ORAL CARE MOUTH RINSE: 15.0000 mL | Freq: Once | OROMUCOSAL | Status: AC

## 2021-04-07 MED ORDER — KETOROLAC TROMETHAMINE 15 MG/ML IJ SOLN
INTRAMUSCULAR | Status: AC
  Administered 2021-04-07: 15 mg via INTRAVENOUS
  Filled 2021-04-07: qty 1

## 2021-04-07 MED ORDER — OXYCODONE HCL 5 MG/5ML PO SOLN: 5.0000 mg | Freq: Once | ORAL | Status: DC | PRN

## 2021-04-07 MED ORDER — MIDAZOLAM HCL 2 MG/2ML IJ SOLN
INTRAMUSCULAR | Status: AC
  Filled 2021-04-07: qty 2

## 2021-04-07 MED ORDER — PROMETHAZINE HCL 25 MG/ML IJ SOLN: 6.2500 mg | INTRAMUSCULAR | Status: DC | PRN

## 2021-04-07 MED ORDER — VISTASEAL 10 ML SINGLE DOSE KIT
PACK | CUTANEOUS | Status: AC
  Filled 2021-04-07: qty 10

## 2021-04-07 MED ORDER — ONDANSETRON HCL 4 MG/2ML IJ SOLN
INTRAMUSCULAR | Status: AC
  Filled 2021-04-07: qty 2

## 2021-04-07 MED ORDER — FENTANYL CITRATE (PF) 100 MCG/2ML IJ SOLN
25.0000 ug | INTRAMUSCULAR | Status: DC | PRN
  Administered 2021-04-07 (×3): 25 ug via INTRAVENOUS

## 2021-04-07 MED ORDER — DILTIAZEM HCL ER COATED BEADS 240 MG PO CP24
240.0000 mg | ORAL_CAPSULE | Freq: Every day | ORAL | Status: DC
  Administered 2021-04-07: 240 mg via ORAL
  Filled 2021-04-07 (×2): qty 1

## 2021-04-07 MED ORDER — PROPOFOL 10 MG/ML IV BOLUS
INTRAVENOUS | Status: AC
  Filled 2021-04-07: qty 20

## 2021-04-07 MED ORDER — KETOROLAC TROMETHAMINE 15 MG/ML IJ SOLN
15.0000 mg | Freq: Four times a day (QID) | INTRAMUSCULAR | Status: DC
  Administered 2021-04-07 – 2021-04-08 (×3): 15 mg via INTRAVENOUS

## 2021-04-07 MED ORDER — LIDOCAINE HCL (PF) 2 % IJ SOLN
INTRAMUSCULAR | Status: AC
  Filled 2021-04-07: qty 5

## 2021-04-07 MED ORDER — METOCLOPRAMIDE HCL 5 MG/ML IJ SOLN
INTRAMUSCULAR | Status: AC
  Filled 2021-04-07: qty 2

## 2021-04-07 MED ORDER — ALBUTEROL SULFATE HFA 108 (90 BASE) MCG/ACT IN AERS: 1.0000 | INHALATION_SPRAY | Freq: Four times a day (QID) | RESPIRATORY_TRACT | Status: DC | PRN

## 2021-04-07 MED ORDER — LEVOTHYROXINE SODIUM 75 MCG PO TABS
75.0000 ug | ORAL_TABLET | Freq: Every day | ORAL | Status: DC
  Administered 2021-04-08: 75 ug via ORAL
  Filled 2021-04-07: qty 1

## 2021-04-07 MED ORDER — SUCCINYLCHOLINE CHLORIDE 200 MG/10ML IV SOSY
PREFILLED_SYRINGE | INTRAVENOUS | Status: DC | PRN
Start: 2021-04-07 — End: 2021-04-07
  Administered 2021-04-07: 100 mg via INTRAVENOUS

## 2021-04-07 MED ORDER — SUCCINYLCHOLINE CHLORIDE 200 MG/10ML IV SOSY
PREFILLED_SYRINGE | INTRAVENOUS | Status: AC
  Filled 2021-04-07: qty 10

## 2021-04-07 MED ORDER — DEXAMETHASONE SODIUM PHOSPHATE 10 MG/ML IJ SOLN
INTRAMUSCULAR | Status: DC | PRN
  Administered 2021-04-07: 5 mg via INTRAVENOUS

## 2021-04-07 MED ORDER — DIPHENHYDRAMINE HCL 50 MG/ML IJ SOLN: 25.0000 mg | Freq: Four times a day (QID) | INTRAMUSCULAR | Status: DC | PRN

## 2021-04-07 MED ORDER — ACETAMINOPHEN 500 MG PO TABS
1000.0000 mg | ORAL_TABLET | Freq: Four times a day (QID) | ORAL | Status: DC
  Administered 2021-04-07 – 2021-04-08 (×2): 1000 mg via ORAL

## 2021-04-07 MED ORDER — PROPOFOL 10 MG/ML IV BOLUS
INTRAVENOUS | Status: DC | PRN
  Administered 2021-04-07: 100 mg via INTRAVENOUS

## 2021-04-07 MED ORDER — ALENDRONATE SODIUM 70 MG PO TABS: 70.0000 mg | ORAL_TABLET | ORAL | Status: DC

## 2021-04-07 MED ORDER — EPHEDRINE 5 MG/ML INJ
INTRAVENOUS | Status: AC
  Filled 2021-04-07: qty 5

## 2021-04-07 MED ORDER — MELATONIN 5 MG PO TABS
5.0000 mg | ORAL_TABLET | Freq: Every evening | ORAL | Status: DC | PRN
  Filled 2021-04-07: qty 1

## 2021-04-07 MED ORDER — METOCLOPRAMIDE HCL 5 MG/ML IJ SOLN
10.0000 mg | Freq: Once | INTRAMUSCULAR | Status: AC
  Administered 2021-04-07: 10 mg via INTRAVENOUS

## 2021-04-07 MED ORDER — CHLORHEXIDINE GLUCONATE CLOTH 2 % EX PADS: 6.0000 | MEDICATED_PAD | Freq: Once | CUTANEOUS | Status: DC

## 2021-04-07 MED ORDER — DEXAMETHASONE SODIUM PHOSPHATE 10 MG/ML IJ SOLN
INTRAMUSCULAR | Status: AC
  Filled 2021-04-07: qty 1

## 2021-04-07 MED ORDER — GLYCOPYRROLATE 0.2 MG/ML IJ SOLN
INTRAMUSCULAR | Status: DC | PRN
  Administered 2021-04-07: .2 mg via INTRAVENOUS

## 2021-04-07 MED ORDER — OXYCODONE HCL 5 MG PO TABS: 5.0000 mg | ORAL_TABLET | ORAL | Status: DC | PRN

## 2021-04-07 MED ORDER — ALBUTEROL SULFATE (2.5 MG/3ML) 0.083% IN NEBU: 2.5000 mg | INHALATION_SOLUTION | Freq: Four times a day (QID) | RESPIRATORY_TRACT | Status: DC | PRN

## 2021-04-07 MED ORDER — ONDANSETRON HCL 4 MG/2ML IJ SOLN
INTRAMUSCULAR | Status: DC | PRN
  Administered 2021-04-07: 4 mg via INTRAVENOUS

## 2021-04-07 MED ORDER — LACTATED RINGERS IV SOLN: INTRAVENOUS | Status: DC

## 2021-04-07 MED ORDER — CEFAZOLIN SODIUM-DEXTROSE 2-4 GM/100ML-% IV SOLN
2.0000 g | INTRAVENOUS | Status: AC
  Administered 2021-04-07: 2 g via INTRAVENOUS

## 2021-04-07 MED ORDER — SODIUM CHLORIDE 0.9 % IV SOLN: INTRAVENOUS | Status: DC

## 2021-04-07 MED ORDER — CHLORHEXIDINE GLUCONATE 0.12 % MT SOLN: 15.0000 mL | Freq: Once | OROMUCOSAL | Status: AC

## 2021-04-07 MED ORDER — PHENYLEPHRINE HCL (PRESSORS) 10 MG/ML IV SOLN
INTRAVENOUS | Status: DC | PRN
  Administered 2021-04-07: 100 ug via INTRAVENOUS

## 2021-04-07 MED ORDER — EPHEDRINE SULFATE 50 MG/ML IJ SOLN
INTRAMUSCULAR | Status: DC | PRN
  Administered 2021-04-07: 5 mg via INTRAVENOUS

## 2021-04-07 MED ORDER — ONDANSETRON 4 MG PO TBDP: 4.0000 mg | ORAL_TABLET | Freq: Four times a day (QID) | ORAL | Status: DC | PRN

## 2021-04-07 MED ORDER — CELECOXIB 200 MG PO CAPS: 200.0000 mg | ORAL_CAPSULE | ORAL | Status: AC

## 2021-04-07 MED ORDER — BUPIVACAINE LIPOSOME 1.3 % IJ SUSP
INTRAMUSCULAR | Status: AC
  Filled 2021-04-07: qty 20

## 2021-04-07 MED ORDER — CEFAZOLIN SODIUM-DEXTROSE 2-4 GM/100ML-% IV SOLN
INTRAVENOUS | Status: AC
  Filled 2021-04-07: qty 100

## 2021-04-07 MED ORDER — BUPIVACAINE-EPINEPHRINE (PF) 0.25% -1:200000 IJ SOLN
INTRAMUSCULAR | Status: AC
  Filled 2021-04-07: qty 30

## 2021-04-07 MED ORDER — MORPHINE SULFATE (PF) 4 MG/ML IV SOLN: 2.0000 mg | INTRAVENOUS | Status: DC | PRN

## 2021-04-07 MED ORDER — CHLORHEXIDINE GLUCONATE 0.12 % MT SOLN
OROMUCOSAL | Status: AC
  Administered 2021-04-07: 15 mL via OROMUCOSAL
  Filled 2021-04-07: qty 15

## 2021-04-07 MED ORDER — GABAPENTIN 300 MG PO CAPS: 300.0000 mg | ORAL_CAPSULE | ORAL | Status: AC

## 2021-04-07 MED ORDER — BUPIVACAINE-EPINEPHRINE 0.25% -1:200000 IJ SOLN
INTRAMUSCULAR | Status: DC | PRN
  Administered 2021-04-07: 30 mL

## 2021-04-07 MED ORDER — ONDANSETRON HCL 4 MG/2ML IJ SOLN: 4.0000 mg | Freq: Four times a day (QID) | INTRAMUSCULAR | Status: DC | PRN

## 2021-04-07 MED ORDER — ACETAMINOPHEN 500 MG PO TABS: 1000.0000 mg | ORAL_TABLET | ORAL | Status: AC

## 2021-04-07 MED ORDER — ROCURONIUM BROMIDE 10 MG/ML (PF) SYRINGE
PREFILLED_SYRINGE | INTRAVENOUS | Status: AC
  Filled 2021-04-07: qty 10

## 2021-04-07 MED ORDER — VISTASEAL 10 ML SINGLE DOSE KIT
PACK | CUTANEOUS | Status: DC | PRN
  Administered 2021-04-07: 10 mL via TOPICAL

## 2021-04-07 MED ORDER — FENTANYL CITRATE (PF) 100 MCG/2ML IJ SOLN
INTRAMUSCULAR | Status: AC
  Filled 2021-04-07: qty 2

## 2021-04-07 MED ORDER — GABAPENTIN 300 MG PO CAPS
ORAL_CAPSULE | ORAL | Status: AC
  Administered 2021-04-07: 300 mg via ORAL
  Filled 2021-04-07: qty 1

## 2021-04-07 MED ORDER — DIPHENHYDRAMINE HCL 25 MG PO CAPS: 25.0000 mg | ORAL_CAPSULE | Freq: Four times a day (QID) | ORAL | Status: DC | PRN

## 2021-04-07 MED ORDER — OXYCODONE HCL 5 MG PO TABS: 5.0000 mg | ORAL_TABLET | Freq: Once | ORAL | Status: DC | PRN

## 2021-04-07 SURGICAL SUPPLY — 63 items
ADH SKN CLS APL DERMABOND .7 (GAUZE/BANDAGES/DRESSINGS) ×2
APL LAPSCP 35 DL APL RGD (MISCELLANEOUS) ×2
APPLICATOR VISTASEAL 35 (MISCELLANEOUS) ×1 IMPLANT
BAG SPEC RTRVL LRG 6X4 10 (ENDOMECHANICALS)
BLADE SURG 15 STRL LF DISP TIS (BLADE) ×2 IMPLANT
BLADE SURG 15 STRL SS (BLADE) ×3
CANNULA REDUC XI 12-8 STAPL (CANNULA) ×1
CANNULA REDUCER 12-8 DVNC XI (CANNULA) ×2 IMPLANT
DECANTER SPIKE VIAL GLASS SM (MISCELLANEOUS) ×3 IMPLANT
DERMABOND ADVANCED (GAUZE/BANDAGES/DRESSINGS) ×1
DERMABOND ADVANCED .7 DNX12 (GAUZE/BANDAGES/DRESSINGS) ×2 IMPLANT
DRAPE 3/4 80X56 (DRAPES) ×2 IMPLANT
DRAPE ARM DVNC X/XI (DISPOSABLE) ×8 IMPLANT
DRAPE COLUMN DVNC XI (DISPOSABLE) ×2 IMPLANT
DRAPE DA VINCI XI ARM (DISPOSABLE) ×4
DRAPE DA VINCI XI COLUMN (DISPOSABLE) ×1
ELECT CAUTERY BLADE 6.4 (BLADE) ×3 IMPLANT
ELECT REM PT RETURN 9FT ADLT (ELECTROSURGICAL) ×3
ELECTRODE REM PT RTRN 9FT ADLT (ELECTROSURGICAL) ×2 IMPLANT
GLOVE SURG ENC MOIS LTX SZ7 (GLOVE) ×12 IMPLANT
GOWN STRL REUS W/ TWL LRG LVL3 (GOWN DISPOSABLE) ×8 IMPLANT
GOWN STRL REUS W/TWL LRG LVL3 (GOWN DISPOSABLE) ×18
GRASPER LAPSCPC 5X45 DSP (INSTRUMENTS) ×3 IMPLANT
IRRIGATION STRYKERFLOW (MISCELLANEOUS) IMPLANT
IRRIGATOR STRYKERFLOW (MISCELLANEOUS) ×3
IV NS 1000ML (IV SOLUTION) ×3
IV NS 1000ML BAXH (IV SOLUTION) IMPLANT
KIT PINK PAD W/HEAD ARE REST (MISCELLANEOUS) ×3
KIT PINK PAD W/HEAD ARM REST (MISCELLANEOUS) ×2 IMPLANT
KIT TURNOVER CYSTO (KITS) ×2 IMPLANT
LABEL OR SOLS (LABEL) ×3 IMPLANT
MANIFOLD NEPTUNE II (INSTRUMENTS) ×3 IMPLANT
MESH BIO-A 7X10 SYN MAT (Mesh General) ×1 IMPLANT
NEEDLE HYPO 22GX1.5 SAFETY (NEEDLE) ×3 IMPLANT
OBTURATOR OPTICAL STANDARD 8MM (TROCAR) ×1
OBTURATOR OPTICAL STND 8 DVNC (TROCAR) ×2
OBTURATOR OPTICALSTD 8 DVNC (TROCAR) ×2 IMPLANT
PACK LAP CHOLECYSTECTOMY (MISCELLANEOUS) ×3 IMPLANT
PENCIL ELECTRO HAND CTR (MISCELLANEOUS) ×3 IMPLANT
POUCH SPECIMEN RETRIEVAL 10MM (ENDOMECHANICALS) IMPLANT
SEAL CANN UNIV 5-8 DVNC XI (MISCELLANEOUS) ×6 IMPLANT
SEAL XI 5MM-8MM UNIVERSAL (MISCELLANEOUS) ×3
SEALER VESSEL DA VINCI XI (MISCELLANEOUS) ×1
SEALER VESSEL EXT DVNC XI (MISCELLANEOUS) ×2 IMPLANT
SOLUTION ELECTROLUBE (MISCELLANEOUS) ×3 IMPLANT
SPONGE T-LAP 18X18 ~~LOC~~+RFID (SPONGE) ×3 IMPLANT
STAPLER CANNULA SEAL DVNC XI (STAPLE) ×2 IMPLANT
STAPLER CANNULA SEAL XI (STAPLE) ×1
SUT MNCRL 4-0 (SUTURE) ×3
SUT MNCRL 4-0 27XMFL (SUTURE) ×2
SUT SILK 2 0 SH (SUTURE) ×9 IMPLANT
SUT VIC AB 3-0 SH 27 (SUTURE)
SUT VIC AB 3-0 SH 27X BRD (SUTURE) IMPLANT
SUT VICRYL 0 AB UR-6 (SUTURE) ×6 IMPLANT
SUT VLOC 90 S/L VL9 GS22 (SUTURE) ×3 IMPLANT
SUTURE MNCRL 4-0 27XMF (SUTURE) ×2 IMPLANT
SYR 20ML LL LF (SYRINGE) ×3 IMPLANT
SYR 30ML LL (SYRINGE) ×3 IMPLANT
TAPE TRANSPORE STRL 2 31045 (GAUZE/BANDAGES/DRESSINGS) IMPLANT
TRAY FOLEY SLVR 16FR LF STAT (SET/KITS/TRAYS/PACK) ×3 IMPLANT
TROCAR BALLN GELPORT 12X130M (ENDOMECHANICALS) ×3 IMPLANT
TROCAR XCEL NON-BLD 5MMX100MML (ENDOMECHANICALS) ×3 IMPLANT
TUBING EVAC SMOKE HEATED PNEUM (TUBING) ×3 IMPLANT

## 2021-04-07 NOTE — Anesthesia Preprocedure Evaluation (Addendum)
Anesthesia Evaluation  Patient identified by MRN, date of birth, ID band Patient awake    Reviewed: Allergy & Precautions, NPO status , Patient's Chart, lab work & pertinent test results, reviewed documented beta blocker date and time   History of Anesthesia Complications Negative for: history of anesthetic complications  Airway Mallampati: III  TM Distance: >3 FB Neck ROM: Full    Dental no notable dental hx. (+) Teeth Intact   Pulmonary neg sleep apnea, COPD,  COPD inhaler, Patient abstained from smoking.Not current smoker, former smoker,    Pulmonary exam normal breath sounds clear to auscultation       Cardiovascular Exercise Tolerance: Good METShypertension, Pt. on medications and Pt. on home beta blockers + CAD (Seen on CT scan 10/04/2020 with cardiomegaly) and +CHF  (-) Past MI (-) dysrhythmias (tachycardia) (-) Valvular Problems/Murmurs Rhythm:Regular Rate:Normal - Systolic murmurs ECHO 04/9620: INTERPRETATION  MILD LV SYSTOLIC DYSFUNCTION (See above)  NORMAL RIGHT VENTRICULAR SYSTOLIC FUNCTION  MILD VALVULAR REGURGITATION (See above)  NO VALVULAR STENOSIS  MILD to MODERATE TR  MILD MR  TRIVIAL PR  EF 50%  Mitral: MILD MR  Tricuspid: MILD TR  Closest EF: 50% (Estimated)  MILD to MODERATE TR  MILD MR  TRIVIAL PR  EF 50%     Neuro/Psych neg Seizures negative neurological ROS  negative psych ROS   GI/Hepatic Neg liver ROS, hiatal hernia, GERD  Medicated and Poorly Controlled,paraesophageal hernia   Endo/Other  neg diabetesHypothyroidism   Renal/GU negative Renal ROS     Musculoskeletal   Abdominal   Peds  Hematology  (+) anemia ,   Anesthesia Other Findings Past Medical History: No date: Anemia No date: COPD (chronic obstructive pulmonary disease) (HCC) No date: Dyspnea No date: GERD (gastroesophageal reflux disease) No date: Hyperlipidemia No date: Hypertension No date: Hypothyroidism   Reproductive/Obstetrics                            Anesthesia Physical  Anesthesia Plan  ASA: 3  Anesthesia Plan: General   Post-op Pain Management:    Induction: Intravenous and Rapid sequence  PONV Risk Score and Plan: 3 and Ondansetron, Dexamethasone, Propofol infusion, Treatment may vary due to age or medical condition and Metaclopromide  Airway Management Planned: Oral ETT  Additional Equipment: None  Intra-op Plan:   Post-operative Plan:   Informed Consent: I have reviewed the patients History and Physical, chart, labs and discussed the procedure including the risks, benefits and alternatives for the proposed anesthesia with the patient or authorized representative who has indicated his/her understanding and acceptance.     Dental advisory given  Plan Discussed with: CRNA and Anesthesiologist  Anesthesia Plan Comments:        Anesthesia Quick Evaluation

## 2021-04-07 NOTE — Anesthesia Procedure Notes (Signed)
Procedure Name: Intubation Date/Time: 04/07/2021 9:55 AM Performed by: Fredderick Phenix, CRNA Pre-anesthesia Checklist: Patient identified, Emergency Drugs available, Suction available and Patient being monitored Patient Re-evaluated:Patient Re-evaluated prior to induction Oxygen Delivery Method: Circle system utilized Preoxygenation: Pre-oxygenation with 100% oxygen Induction Type: IV induction Ventilation: Mask ventilation without difficulty Laryngoscope Size: Mac and 3 Grade View: Grade II Tube type: Oral Tube size: 7.0 mm Number of attempts: 1 Airway Equipment and Method: Stylet and Oral airway Placement Confirmation: ETT inserted through vocal cords under direct vision, positive ETCO2 and breath sounds checked- equal and bilateral Secured at: 21 cm Tube secured with: Tape Dental Injury: Teeth and Oropharynx as per pre-operative assessment

## 2021-04-07 NOTE — Anesthesia Postprocedure Evaluation (Signed)
Anesthesia Post Note  Patient: Sarae Nicholes Shingledecker  Procedure(s) Performed: XI ROBOTIC ASSISTED PARAESOPHAGEAL HERNIA REPAIR with RNFA to assist INSERTION OF MESH LAPAROSCOPIC NISSEN FUNDOPLICATION  Patient location during evaluation: PACU Anesthesia Type: General Level of consciousness: awake and alert Pain management: pain level controlled Vital Signs Assessment: post-procedure vital signs reviewed and stable Respiratory status: spontaneous breathing, nonlabored ventilation and respiratory function stable Cardiovascular status: blood pressure returned to baseline and stable Postop Assessment: no apparent nausea or vomiting Anesthetic complications: no   No notable events documented.   Last Vitals:  Vitals:   04/07/21 1400 04/07/21 1430  BP: 132/64 (!) 142/70  Pulse: (!) 53 (!) 56  Resp: 12 15  Temp: (!) 36.1 C (!) 36.1 C  SpO2: 95% 94%    Last Pain:  Vitals:   04/07/21 1430  TempSrc: Temporal  PainSc: [place]

## 2021-04-07 NOTE — Op Note (Signed)
Robotic assisted laparoscopic Nissen fundoplication w repair of  Paraesophageal  hernia with Bio-A Mesh  Pre-operative Diagnosis: Symptomatic type III paraesophageal  hernia  Post-operative Diagnosis: same  Procedure:  Robotic assisted laparoscopic Nissen fundoplication w repair of  Paraesophageal  hernia  Surgeon: [name], MD FACS  Assistant: [facility] RNFA. Required due to the complexity of the case the need for exposure and lack of first assist.  Anesthesia: Gen. with endotracheal tube  Findings: Type III paraesophageal hernia Loose wrap 360 degree over 50 FR Bougie No evidence of esophageal motility disorder, normal esophagus. I re reviewed barium swallow, no true evidence of motility disorder, esophagus is tortuous due to the type III hernia but not to a primary motility disorder. I decided to do 360 degree wrap.  Estimated Blood Loss: 10cc               Complications: none   Procedure Details  The patient was seen again in the Holding Room. The benefits, complications, treatment options, and expected outcomes were discussed with the patient. The risks of bleeding, infection, recurrence of symptoms, failure to resolve symptoms,  esophageal damage, Dysphagia, bowel injury, any of which could require further surgery were reviewed with the patient. The likelihood of improving the patient's symptoms with return to their baseline status is good.  The patient and/or family concurred with the proposed plan, giving informed consent.  The patient was taken to Operating Room, identified  and the procedure verified.  A Time Out was held and the above information confirmed.  Prior to the induction of general anesthesia, antibiotic prophylaxis was administered. VTE prophylaxis was in place. General endotracheal anesthesia was then administered and tolerated well. After the induction, the abdomen was prepped with Chloraprep and draped in the sterile fashion. The patient was  positioned in the supine position.  Cut down technique was used to enter the abdominal cavity and a Hasson trochar was placed after two vicryl stitches were anchored to the fascia. Pneumoperitoneum was then created with CO2 and tolerated well without any adverse changes in the patient's vital signs.  Three 8-mm ports were placed under direct vision. All skin incisions  were infiltrated with a local anesthetic agent before making the incision and placing the trocars. An additional 5 mm regular laparoscopic port was placed to assist with retraction and exposure.   The patient was positioned  in reverse Trendelenburg, robot was brought to the surgical field and docked in the standard fashion.  We made sure all the instrumentation was kept indirect view at all times and that there were no collision between the arms. I scrubbed out and went to the console.  I used a robotic arm to retract the liver, the vessel sealer on my right hand and a forced bipolar grasper on my left hand.  There is along the extra 5 mm port allow me ample exposure and the ability to perform meticulous dissection  We Started dividing the lesser omentum via the pars flaccida.  We Were able to dissect the lesser curvature of the stomach and  dissected the fundus free from the right and left crus.   Giant type III paraesophageal hernia.   We circumferentially dissected the GE junction.  The hernia sac was also completely reduced and we were able to bring the stomach into the intra-abdominal position.  Attention then was turned to the greater curvature where the short gastrics were divided with sealer device.  We were able to identify the left crus and  again were able to make sure there was a good circumferential dissection and that the hernia sac was completely excised.  We did perform  good dissection within the mediastinum to allow a complete reduction of the sac and a to completely allow an intra-abdominal Nissen fundoplication.  2-0V  lock suture was inserted and the crus as well as the hernia was closed with a running suture  We Asked anesthesia to place a 50 French bougie and this went easily.  We also observe trajectory of the bougie. 360 degree Nissen fundoplication was created with multiple 2-0 silk sutures and we placed 3 stitches taking some of the esophagus within that bite.  The fundoplication measured approximately 3-1/2 cm and he was floppy. I was very happy with the way the fundoplication laid and the repair of the hernia.  Inspection of the  upper quadrant was performed. No bleeding, bile  Or esophageal injuries leaks, or bowel injuries were noted. Robotic instruments and robotic arms were undocked in the standard fashion. All the needles were removed under direct visualization.   I scrubbed back in. The hernia sac was excised.  Pneumoperitoneum was released.  The periumbilical port site was closed with interrumpted 0 Vicryl sutures. 4-0 subcuticular Monocryl was used to close the skin. Liposomal marcaine was injected to all the incisions sites.  Dermabond was  applied.  The patient was then extubated and brought to the recovery room in stable condition. Sponge, lap, and needle counts were correct at closure and at the conclusion of the case.               [name], MD, FACS

## 2021-04-07 NOTE — Interval H&P Note (Signed)
History and Physical Interval Note:  04/07/2021 9:21 AM  Kelly Wood  has presented today for surgery, with the diagnosis of paraesophageal hernia.  The various methods of treatment have been discussed with the patient and family. After consideration of risks, benefits and other options for treatment, the patient has consented to  Procedure(s): XI ROBOTIC ASSISTED PARAESOPHAGEAL HERNIA REPAIR with RNFA to assist (N/A) as a surgical intervention.  The patient's history has been reviewed, patient examined, no change in status, stable for surgery.  I have reviewed the patient's chart and labs.  Questions were answered to the patient's satisfaction.     [place]

## 2021-04-07 NOTE — Discharge Instructions (Signed)
In addition to included general post-operative instructions,  Diet: Recommend following Nissen diet for about 4 week; Handouts were given on this. .   Activity: No heavy lifting >20 pounds (children, pets, laundry, garbage) or strenuous activity for 4 weeks, but light activity and walking are encouraged. Do not drive or drink alcohol if taking narcotic pain medications or having pain that might distract from driving.  Wound care: 2 days after surgery (01/05), you may shower/get incision wet with soapy water and pat dry (do not rub incisions), but no baths or submerging incision underwater until follow-up.   Medications: Resume all home medications. For mild to moderate pain: acetaminophen (Tylenol) or ibuprofen/naproxen (if no kidney disease). Combining Tylenol with alcohol can substantially increase your risk of causing liver disease. Narcotic pain medications, if prescribed, can be used for severe pain, though may cause nausea, constipation, and drowsiness. Do not combine Tylenol and Percocet (or similar) within a 6 hour period as Percocet (and similar) contain(s) Tylenol. If you do not need the narcotic pain medication, you do not need to fill the prescription.  Call office [phone] / [phone]) at any time if any questions, worsening pain, fevers/chills, bleeding, drainage from incision site, or other concerns.

## 2021-04-07 NOTE — Transfer of Care (Signed)
Immediate Anesthesia Transfer of Care Note  Patient: Kelly Wood  Procedure(s) Performed: XI ROBOTIC ASSISTED PARAESOPHAGEAL HERNIA REPAIR with RNFA to assist INSERTION OF MESH LAPAROSCOPIC NISSEN FUNDOPLICATION  Patient Location: PACU  Anesthesia Type:General  Level of Consciousness: awake and alert   Airway & Oxygen Therapy: Patient Spontanous Breathing and Patient connected to face mask oxygen  Post-op Assessment: Report given to RN and Post -op Vital signs reviewed and stable  Post vital signs: Reviewed and stable  Last Vitals:  Vitals Value Taken Time  BP 132/65 04/07/21 1231  Temp    Pulse 64 04/07/21 1233  Resp 13 04/07/21 1233  SpO2 100 % 04/07/21 1233  Vitals shown include unvalidated device data.  Last Pain:  Vitals:   04/07/21 0824  TempSrc: Temporal  PainSc: 0-No pain         Complications: No notable events documented.

## 2021-04-08 ENCOUNTER — Encounter: Payer: Self-pay | Admitting: Surgery

## 2021-04-08 DIAGNOSIS — K449 Diaphragmatic hernia without obstruction or gangrene: Secondary | ICD-10-CM | POA: Diagnosis not present

## 2021-04-08 LAB — BASIC METABOLIC PANEL
Anion gap: 10 (ref 5–15)
BUN: 16 mg/dL (ref 8–23)
CO2: 24 mmol/L (ref 22–32)
Calcium: 8.6 mg/dL — ABNORMAL LOW (ref 8.9–10.3)
Chloride: 102 mmol/L (ref 98–111)
Creatinine, Ser: 0.54 mg/dL (ref 0.44–1.00)
GFR, Estimated: >60 mL/min (ref >60)
Glucose, Bld: 141 mg/dL — ABNORMAL HIGH (ref 70–99)
Potassium: 4 mmol/L (ref 3.5–5.1)
Sodium: 136 mmol/L (ref 135–145)

## 2021-04-08 LAB — SURGICAL PATHOLOGY

## 2021-04-08 LAB — CBC
HCT: 35.3 % — ABNORMAL LOW (ref 36.0–46.0)
Hemoglobin: 11.5 g/dL — ABNORMAL LOW (ref 12.0–15.0)
MCH: 28.4 pg (ref 26.0–34.0)
MCHC: 32.6 g/dL (ref 30.0–36.0)
MCV: 87.2 fL (ref 80.0–100.0)
Platelets: 234 10*3/uL (ref 150–400)
RBC: 4.05 MIL/uL (ref 3.87–5.11)
RDW: 14.5 % (ref 11.5–15.5)
WBC: 14.8 10*3/uL — ABNORMAL HIGH (ref 4.0–10.5)
nRBC: 0 % (ref 0.0–0.2)

## 2021-04-08 MED ORDER — ACETAMINOPHEN 500 MG PO TABS
ORAL_TABLET | ORAL | Status: AC
  Administered 2021-04-08: 1000 mg via ORAL
  Filled 2021-04-08: qty 2

## 2021-04-08 MED ORDER — KETOROLAC TROMETHAMINE 15 MG/ML IJ SOLN
INTRAMUSCULAR | Status: AC
  Filled 2021-04-08: qty 1

## 2021-04-08 MED ORDER — IBUPROFEN 600 MG PO TABS: 600.0000 mg | ORAL_TABLET | Freq: Four times a day (QID) | ORAL | 0 refills | Status: DC | PRN

## 2021-04-08 MED ORDER — ACETAMINOPHEN 500 MG PO TABS
ORAL_TABLET | ORAL | Status: AC
  Filled 2021-04-08: qty 2

## 2021-04-08 MED ORDER — ACETAMINOPHEN 500 MG PO TABS: 1000.0000 mg | ORAL_TABLET | Freq: Four times a day (QID) | ORAL | 0 refills | Status: DC

## 2021-04-08 MED ORDER — PREGABALIN 50 MG PO CAPS
ORAL_CAPSULE | ORAL | Status: AC
  Filled 2021-04-08: qty 1

## 2021-04-08 MED ORDER — KETOROLAC TROMETHAMINE 30 MG/ML IJ SOLN
INTRAMUSCULAR | Status: AC
  Administered 2021-04-08: 15 mg
  Filled 2021-04-08: qty 1

## 2021-04-08 MED ORDER — ENOXAPARIN SODIUM 40 MG/0.4ML IJ SOSY
PREFILLED_SYRINGE | INTRAMUSCULAR | Status: AC
  Filled 2021-04-08: qty 0.4

## 2021-04-08 NOTE — Progress Notes (Signed)
IV removed, patient verbalized desire to be discharged, no complaints of pain, nor discomfort. RN, Patient, and husband Laverna Peace reviewed discharge orders, medication details, and follow up appointment.  No questions or concerns verbalized.  Patient verbalized appreciation of services while receiving care.  Patient to be transferred to personal care via wheelchair to her residence.

## 2021-04-08 NOTE — Discharge Summary (Signed)
[facility] SURGICAL DISCHARGE SUMMARY  Patient ID: Kelly Wood MRN: [id number] DOB/AGE: 1940/10/05 81 y.o.  Admit date: 04/07/2021 Discharge date: 04/08/2021  Discharge Diagnoses Patient Active Problem List   Diagnosis Date Noted   S/P repair of paraesophageal hernia 04/07/2021    Consultants None  Procedures 04/07/2020:  Robotic assisted laparoscopic paraesophageal hernia and Nissen fundoplication  HPI: Kelly Wood is a 81 y.o. female who presents to [facility] for scheduled above surgery with Dr Dahlia Byes on 01/03.   Hospital Course: Informed consent was obtained and documented, and patient underwent uneventful robotic assisted laparoscopic paraesophageal hernia and Nissen fundoplication (Dr Dahlia Byes, 09/26/7626).  Post-operatively, patient did very well. Advancement of patient's diet and ambulation were well-tolerated. The remainder of patient's hospital course was essentially unremarkable, and discharge planning was initiated accordingly with patient safely able to be discharged home with appropriate discharge instructions, pain control, and outpatient follow-up after all of her and her husband's questions were answered to their expressed satisfaction.   Discharge Condition: Good   Physical Examination:  Constitutional: Well appearing female, NAD Pulmonary: Normal effort, no respiratory distress Gastrointestinal: Soft, incisional soreness expectedly, non-distended, no rebound/guarding Skin: Laparoscopic incisions are CDI with dermabond, no erythema or drainage    Allergies as of 04/08/2021       Reactions   Azithromycin Diarrhea   Levofloxacin Diarrhea   Pt prefers not to take because gi side effects   Sulfa Antibiotics Rash        Medication List     STOP taking these medications    ZEGERID OTC PO       TAKE these medications    acetaminophen 500 MG tablet Commonly known as: TYLENOL Take 500 mg by mouth every 6 (six) hours as needed. What  changed: Another medication with the same name was added. Make sure you understand how and when to take each.   acetaminophen 500 MG tablet Commonly known as: TYLENOL Take 2 tablets (1,000 mg total) by mouth every 6 (six) hours. What changed: You were already taking a medication with the same name, and this prescription was added. Make sure you understand how and when to take each.   albuterol 108 (90 Base) MCG/ACT inhaler Commonly known as: VENTOLIN HFA Inhale 1 puff into the lungs every 6 (six) hours as needed for shortness of breath or wheezing.   alendronate 70 MG tablet Commonly known as: FOSAMAX Take 70 mg by mouth once a week. Mondays   aspirin EC 81 MG tablet Take 81 mg by mouth daily.   bisoprolol-hydrochlorothiazide 10-6.25 MG tablet Commonly known as: ZIAC Take 1 tablet by mouth daily.   diltiazem 240 MG 24 hr capsule Commonly known as: CARDIZEM CD Take 240 mg by mouth daily with supper.   fluocinonide gel 0.05 % Commonly known as: LIDEX Apply 1 application topically daily as needed (eczema).   furosemide 40 MG tablet Commonly known as: LASIX Take 40 mg by mouth daily as needed for edema.   ibuprofen 200 MG tablet Commonly known as: ADVIL Take 400 mg by mouth every 6 (six) hours as needed for mild pain. What changed: Another medication with the same name was added. Make sure you understand how and when to take each.   ibuprofen 600 MG tablet Commonly known as: ADVIL Take 1 tablet (600 mg total) by mouth every 6 (six) hours as needed. What changed: You were already taking a medication with the same name, and this prescription was added. Make sure you understand how  and when to take each.   levothyroxine 75 MCG tablet Commonly known as: SYNTHROID Take 75 mcg by mouth daily before breakfast. Take on an empty stomach with a glass of water at least 30-60 minutes before breakfast   Melatonin 5 MG Caps Take 5 mg by mouth daily as needed (sleep).   multivitamin  with minerals Tabs tablet Take 1 tablet by mouth daily.   Potassium 99 MG Tabs Take 99 mg by mouth daily.   pravastatin 80 MG tablet Commonly known as: PRAVACHOL Take 80 mg by mouth daily.   PROBIOTIC DAILY PO Take 1 capsule by mouth daily.   tetrahydrozoline 0.05 % ophthalmic solution Place 1 drop into both eyes 2 (two) times daily as needed (irritated eyes).   Turmeric 500 MG Tabs Take 500 mg by mouth daily.          Follow-up Information     Pabon, [place] F, MD Follow up in 2 week(s).   Specialty: General Surgery Why: s/p paraesophageal hernia repair, nissen fundoplication with Dr Ulyses Amor information: [address] [place] [address] [phone]                  Time spent on discharge management including discussion of hospital course, clinical condition, outpatient instructions, prescriptions, and follow up with the patient and members of the medical team: >30 minutes  -- Edison Simon , PA-C [facility]  04/08/2021, 9:19 AM [phone] M-F: 7am - 4pm

## 2021-04-09 ENCOUNTER — Telehealth: Payer: Self-pay | Admitting: *Deleted

## 2021-04-09 NOTE — Telephone Encounter (Signed)
Patient called and stated that she had paraesophageal hernia repair by Dr Dahlia Byes on 04/07/21 and yesterday her face got red and just wasn't feeling good. She took her blood pressure and it was 153/84, pulse 61 and temp 98.4, then last night she was getting up every hour because she felt the need to urinate. When she stood up her urine would flow out. She had to wear a pad. She is also complaining about her chest hurting where the surgery was done at. NOT CHEST PAIN- she stated that if that was the case she would of went to the ER. Please call and advise

## 2021-04-09 NOTE — Telephone Encounter (Signed)
Paraesophageal hernia repair 04/07/2021. Face/cheeks are a little flushed-she has been experiencing some discomfort-we discussed walking around to move the gas out-patient stats she is feeling better-she wanted to know if she can take ibuprofen/Tylenol for the discomfort. She is following the list of foods she was provided. She stated she took a nap yesterday and woke up and felt like she needed to pee-she ended up urinating on her self-this morning she states she is urinating normal. No burning with urination. Denies fever-no difficulty with swallowing-incisions looks well per patient. Patient requested to be seen tomorrow just as precaution and was added to Zach's schedule for 11:30. Patient spoke very highly of Dr.Pabon and Thedore Mins and appreciated the quick response to her concerns.

## 2021-04-10 ENCOUNTER — Encounter: Payer: Self-pay | Admitting: Physician Assistant

## 2021-04-10 ENCOUNTER — Other Ambulatory Visit: Payer: Self-pay

## 2021-04-10 ENCOUNTER — Ambulatory Visit (INDEPENDENT_AMBULATORY_CARE_PROVIDER_SITE_OTHER): Payer: Medicare HMO | Admitting: Physician Assistant

## 2021-04-10 VITALS — BP 123/74 | HR 56 | Temp 97.9°F | Ht 60.0 in | Wt 136.2 lb

## 2021-04-10 DIAGNOSIS — Z09 Encounter for follow-up examination after completed treatment for conditions other than malignant neoplasm: Secondary | ICD-10-CM

## 2021-04-10 DIAGNOSIS — K449 Diaphragmatic hernia without obstruction or gangrene: Secondary | ICD-10-CM

## 2021-04-10 NOTE — Progress Notes (Signed)
[facility] POST-OP OFFICE VISIT  04/13/2021  HPI: Kelly Wood is a 81 y.o. female 3 days s/p robotic assisted laparoscopic paraesophageal hernia repair and Nissen fundoplication with Dr Dahlia Byes.   She is overall doing very well She had urinary incontinence the first night after surgery but this resolved on its own.  She does have some "tightness" with swallowing but does not feel food nor pills getting stuck No fever, chills, nausea, emesis, or bowel changes No abdominal pain Incisions are healing expectedly No other complaints   Vital signs: BP 123/74    Pulse (!) 56    Temp 97.9 F (36.6 C) (Oral)    Ht 5' (1.524 m)    Wt 136 lb 3.2 oz (61.8 kg)    SpO2 98%    BMI 26.60 kg/m    Physical Exam: Constitutional: Well appearing female, NAD Abdomen: Soft, non-tender, non-distended, no rebound/guarding Skin: Laparoscopic incisions are CDI with dermabond, no erythema or drainage, expected ecchymosis   Assessment/Plan: This is a 81 y.o. female 3 days s/p robotic assisted laparoscopic paraesophageal hernia repair and Nissen fundoplication    - Recommend continuation of Nissen diet for 4 weeks; there is likely some degree of expected inflammation at the site  - Urinary symptoms are likely secondary to foley placed intra-operatively; resolved  - Continue pain medications as needed  - Reviewed local wound care  - Reviewed lifting restrictions; 4 weeks total   - She is set to follow up with Dr Dahlia Byes on 01/18; Advised to call in the interim with any concerns   -- Edison Simon, PA-C [facility] 04/13/2021, 8:35 AM [phone] M-F: 7am - 4pm

## 2021-04-10 NOTE — Patient Instructions (Addendum)
Continue with the Boost/ Ensure drinks. You may continue using the pillow between your legs to help relieve your hip pain.   Follow the diet restrictions your were provided.   Try the Miralax for your constipation.  Continue to walk around. Keep your scheduled appointment on 04/22/2021 with Dr.Pabon.

## 2021-04-13 ENCOUNTER — Encounter: Payer: Self-pay | Admitting: Physician Assistant

## 2021-04-16 ENCOUNTER — Telehealth: Payer: Self-pay

## 2021-04-16 NOTE — Telephone Encounter (Signed)
Paraesophageal  hernia repair 04/07/2021- last bowel movement was hard yesterday-still on the full liquid diet-she did eat some oatmeal and yogurt this morning. She will try some Miralax and see if this helps.she will call tomorrow if she has any questions or concerns.

## 2021-04-22 ENCOUNTER — Encounter: Payer: Self-pay | Admitting: Surgery

## 2021-04-22 ENCOUNTER — Other Ambulatory Visit: Payer: Self-pay

## 2021-04-22 ENCOUNTER — Ambulatory Visit (INDEPENDENT_AMBULATORY_CARE_PROVIDER_SITE_OTHER): Payer: Medicare HMO | Admitting: Surgery

## 2021-04-22 VITALS — BP 113/57 | HR 46 | Temp 98.3°F | Ht 60.0 in | Wt 132.0 lb

## 2021-04-22 DIAGNOSIS — K449 Diaphragmatic hernia without obstruction or gangrene: Secondary | ICD-10-CM

## 2021-04-22 DIAGNOSIS — Z09 Encounter for follow-up examination after completed treatment for conditions other than malignant neoplasm: Secondary | ICD-10-CM

## 2021-04-22 NOTE — Patient Instructions (Addendum)
Use the Miralax occassionally for constipation. Please see your follow up appointment listed below.

## 2021-04-23 NOTE — Progress Notes (Signed)
The 81-year-old female status post robotic paraesophageal hernia repair.  She is doing well.  No fevers no chills.  Has noticed very minimal discomfort when swallowing some pills.  She denies any vomiting no nausea.  She is tolerating soft diet.  Asking to advance to brat and crackers.  Advised her against that. He is burping some  PE NAD Abd: soft, incisions c/d/I, non tender no infection or peritonitis  A/P doing well, without complication suspected postoperative course. She wants to be seen again in a few weeks. Continue Nissen diet and is slowly progress

## 2021-04-27 ENCOUNTER — Telehealth: Payer: Self-pay | Admitting: *Deleted

## 2021-04-27 NOTE — Telephone Encounter (Signed)
Patient called and stated she had spoke with Freda Munro on 04/16/21 in regards to bowel movements. She was told to try Miralax and let us know how she was doing. So she stated that she took Miralax Friday night and Saturday night but is still having to strain. She did have a little bit of bowel movement this morning but its still no normal. She is feeling a lot of gas in her chest. She wants to know if there is anything else she can do .  She had surgery on 04/07/21 Dr Dahlia Byes Paraesophageal hernia repair.

## 2021-04-27 NOTE — Telephone Encounter (Signed)
Spoke with patient and let her know that she may take the Miralax twice a day until she starts having good bowel movements. Then she may back off to once a day and even every other day to maintain good bowel habits. She may also take some Simethicone for her gas as well as walking around to relieve it. She will call back with any other questions.

## 2021-05-11 ENCOUNTER — Encounter: Payer: Self-pay | Admitting: Surgery

## 2021-05-11 ENCOUNTER — Other Ambulatory Visit: Payer: Self-pay

## 2021-05-11 ENCOUNTER — Ambulatory Visit (INDEPENDENT_AMBULATORY_CARE_PROVIDER_SITE_OTHER): Payer: Medicare HMO | Admitting: Surgery

## 2021-05-11 VITALS — BP 160/88 | HR 61 | Temp 98.4°F | Ht 60.0 in | Wt 127.2 lb

## 2021-05-11 DIAGNOSIS — K449 Diaphragmatic hernia without obstruction or gangrene: Secondary | ICD-10-CM

## 2021-05-11 DIAGNOSIS — Z09 Encounter for follow-up examination after completed treatment for conditions other than malignant neoplasm: Secondary | ICD-10-CM

## 2021-05-11 NOTE — Patient Instructions (Addendum)
Please call with any questions or concerns. You may begin eating small meals. Chew your food well.

## 2021-05-13 ENCOUNTER — Encounter: Payer: Self-pay | Admitting: Surgery

## 2021-05-13 NOTE — Progress Notes (Signed)
81 year old female status post robotic paraesophageal hernia repair 04/07/21.  She is doing well.  No fevers no chills.  .  She denies any vomiting no nausea.  She is tolerating soft diet.  Asking to advance diet. Pulmonary sxs mch improved   PE NAD Abd: soft, incisions healed, non tender no infection or peritonitis   A/P doing well, without complication suspected postoperative course. Advance diet RTC prn

## 2021-06-11 NOTE — Addendum Note (Signed)
Encounter addended by: Annie Paras on: 06/11/2021 2:58 PM  Actions taken: Letter saved

## 2021-07-13 ENCOUNTER — Encounter: Payer: Self-pay | Admitting: Surgery

## 2021-07-13 ENCOUNTER — Ambulatory Visit: Payer: Medicare HMO | Admitting: Surgery

## 2021-07-13 VITALS — BP 131/64 | HR 55 | Temp 98.0°F | Ht 60.0 in | Wt 128.2 lb

## 2021-07-13 DIAGNOSIS — Z8719 Personal history of other diseases of the digestive system: Secondary | ICD-10-CM | POA: Diagnosis not present

## 2021-07-13 DIAGNOSIS — K449 Diaphragmatic hernia without obstruction or gangrene: Secondary | ICD-10-CM | POA: Diagnosis not present

## 2021-07-13 DIAGNOSIS — Z09 Encounter for follow-up examination after completed treatment for conditions other than malignant neoplasm: Secondary | ICD-10-CM

## 2021-07-13 NOTE — Progress Notes (Signed)
Outpatient Surgical Follow Up ? ?07/13/2021 ? ?Kelly Wood is an 81 y.o. female.  ? ?Chief Complaint  ?Patient presents with  ? Follow-up  ?  Nissen Fundoplication   ? ? ?HPI: Kelly Wood is a 81 year old female aprx 3-1/2 months out from a robotic paraesophageal hernia repair.  She denies any fevers and chills denies any reflux whatsoever.  Chronic cough significantly improved.  She did go to her pulmonary doctor with improvement in pulmonary performance.  She did have some sweets last night had some colic this was all related to the surgery.  The cramping abdominal pain subsided after patient had BM ( diarrhea). ? ?Past Medical History:  ?Diagnosis Date  ? Anemia   ? 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 COPD (chronic obstructive pulmonary disease) ([place])   ? Dyspnea   ? GERD (gastroesophageal reflux disease)   ? Hyperlipidemia   ? Hypertension   ? Hypothyroidism   ? ? ?Past Surgical History:  ?Procedure Laterality Date  ? COLONOSCOPY N/A 06/23/2016  ? Procedure: COLONOSCOPY;  Surgeon: Manya Silvas, MD;  Location[facility] ENDOSCOPY;  Service: Endoscopy;  Laterality: N/A;  ? FEMUR FRACTURE SURGERY Right 02/07/2019  ? rod placed  ? HARDWARE REMOVAL Right 11/06/2020  ? Procedure: EXCHANGE OF LAG SCREW, RIGHT HIP;  Surgeon: Corky Mull, MD;  Location: [facility] ORS;  Service: Orthopedics;  Laterality: Right;  ? INSERTION OF MESH  04/07/2021  ? Procedure: INSERTION OF MESH;  Surgeon: Jules Husbands, MD;  Location: [facility] ORS;  Service: General;;  ? INTRAMEDULLARY (IM) NAIL INTERTROCHANTERIC Right 12/26/2018  ? Procedure: INTRAMEDULLARY (IM) NAIL INTERTROCHANTRIC;  Surgeon: Corky Mull, MD;  Location: [facility] ORS;  Service: Orthopedics;  Laterality: Right;  ? LAPAROSCOPIC NISSEN FUNDOPLICATION  07/06/3293  ? Procedure: LAPAROSCOPIC NISSEN FUNDOPLICATION;  Surgeon: Jules Husbands, MD;  Location: [facility] ORS;  Service: General;;  ? [place]  ? XI ROBOTIC ASSISTED PARAESOPHAGEAL HERNIA REPAIR N/A 04/07/2021  ? Procedure: XI ROBOTIC  ASSISTED PARAESOPHAGEAL HERNIA REPAIR with RNFA to assist;  Surgeon: Jules Husbands, MD;  Location: [facility] ORS;  Service: General;  Laterality: N/A;  ? ? ?Family History  ?Problem Relation Age of Onset  ? Breast cancer Neg Hx   ? ? ?Social History:  reports that she quit smoking about 14 years ago. Her smoking use included cigarettes. She has never used smokeless tobacco. She reports current alcohol use of about 1.0 standard drink per week. She reports that she does not use drugs. ? ?Allergies:  ?Allergies  ?Allergen Reactions  ? Azithromycin Diarrhea  ? Levofloxacin Diarrhea  ?  Pt prefers not to take because gi side effects  ? Sulfa Antibiotics Rash  ? ? ?Medications reviewed. ? ? ? ?ROS ?Full ROS performed and is otherwise negative other than what is stated in HPI ? ? ?BP 131/64   Pulse (!) 55   Temp 98 ?F (36.7 ?C) (Oral)   Ht 5' (1.524 m)   Wt 128 lb 3.2 oz (58.2 kg)   SpO2 98%   BMI 25.04 kg/m?  ? ?Physical Exam ?NAD alert ?Chest: CTA nsr ?Abd : soft, nt, robotic scars w/o infection or hernia ?Ext: well perfused and warm ? ? ?Assessment/Plan: ? ?1. History of hiatal hernia ?Doing very well after robotic repair.  No complications.  Symptoms completely resolved. ?RTC prn ? ?Greater than 50% of the 20 minutes  visit was spent in counseling/coordination of care ? ? ?[name], MD FACS ?General Surgeon  ?

## 2021-07-13 NOTE — Patient Instructions (Signed)
If you have any concerns or questions, please feel free to call our office. Follow up as needed.  ? ? ?Eating Plan After Nissen Fundoplication ? ?After a Nissen fundoplication procedure, it is common to have some difficulty swallowing. The part of your body that moves food and liquid from your mouth to your stomach (esophagus) will be swollen and may feel tight. It will take several weeks or months for your esophagus and stomach to heal. ?By following a special eating plan, you can prevent problems such as pain, swelling or pressure in the abdomen (bloating), gas, nausea, or diarrhea. ?What are tips for following this plan? ?Cooking ?Cook all foods until they are soft. ?Remove skins and seeds from fruits and vegetables before eating. ?Remove skin and gristle from meats. Grind or finely mince meats before eating. ?Avoid over-cooking meat. Dry, tough meat is more difficult to swallow. ?Avoid using oil when cooking, or use only a small amount of oil. ?Avoid using seasoning when cooking, or use only a small amount of seasoning. ?Toast bread before eating. This makes it easier to swallow. ?Meal planning ? ?Eat 6-8 small meals throughout the day. ?Right after the surgery, have a few meals that are only clear liquids. Clear liquids include: ?Water. ?Clear fruit juice, no pulp. ?Chicken, beef, or vegetable broth. ?Gelatin. ?Decaffeinated tea or coffee without milk. ?Popsicles or shaved ice. ?Depending on your progress, you may move to a full liquid diet as told by your health care provider. This includes clear liquids and the following: ?Dairy and alternative milks, such as soy milk. ?Strained creamed soups. ?Ice cream or sherbet. ?Pudding. ?Nutritional supplement drinks. ?Yogurt. ?A few days after surgery, you may be able to start eating a diet of soft foods. You may need to eat according to this plan for several weeks. ?Do not eat sweets or sweetened drinks at the beginning of a meal. Doing that may cause your stomach to  empty faster than it should (dumping syndrome). ?Lifestyle ?Always sit upright when eating or drinking. ?Eat slowly. Take small bites and chew food well before swallowing. ?Do not lie down after eating. Stay sitting up for 30 minutes or longer after each meal. ?Sip fluids between meals. ?Limit how much you drink at one time. With meals and snacks, have 4-8 oz (120-240 mL). This is equal to ? cup-1 cup. ?Do not mix solid foods and liquids in the same mouthful. ?Drink enough fluid to keep your urine pale yellow. ?Do not chew gum or drink fluids through a straw. Doing those things may cause you to swallow extra air. ?General information ?Do not drink carbonated drinks or alcohol. ?Avoid foods and drinks that contain caffeine and chocolate. ?Avoid foods and drinks that contain citrus or tomato. ?Allow hot soups and drinks to cool before eating. ?Avoid foods that cause gas, such as beans, peas, broccoli, or cabbage. ?If dairy milk products cause diarrhea, avoid them or eat them in small amounts. ?Recommended foods ?Fruits ?Any soft-cooked fruits after skins and seeds are removed. Fruit juice. ?Vegetables ?Any soft-cooked vegetables after skins and seeds are removed. Vegetable juice. ?Grains ?Cooked cereals. Dry cereals softened with liquid. Cooked pasta, rice, or other grains. Toasted bread. Bland crackers, such as soda or graham crackers. ?Meats and other protein foods ?Tender cuts of meat, poultry, or fish after bones, skin, and gristle are removed. Poached, boiled, or scrambled eggs. Canned fish. Tofu. Creamy nut butters. ?Dairy ?Milk. Yogurt. Cottage cheese. Mild cheeses. ?Beverages ?Nutritional supplement drinks. Decaffeinated tea or coffee. Sports  drinks. ?Fats and oils ?Butter. Margarine. Mayonnaise. Vegetable oil. Smooth salad dressing. ?Sweets and desserts ?Plain hard candy. Marshmallows. Pudding. Ice cream. Gelatin. Sherbet. ?Seasoning and other foods ?Salt. Light seasonings. Mustard. Vinegar. ?The items listed  above may not be a complete list of recommended foods and beverages. Contact a dietitian for more information. ?Foods to avoid ?Fruits ?Oranges. Grapefruit. Lemons. Limes. Citrus juices. Dried fruit. Crunchy, raw fruits. ?Vegetables ?Tomato sauce. Tomato juice. Broccoli. Cauliflower. Cabbage. Brussels sprouts. Crunchy, raw vegetables. ?Grains ?High-fiber or bran cereal. Cereal with nuts, dried fruit, or coconut. Sweet breads, rolls, coffee cake, or donuts. Chewy or crusty breads. Popcorn. ?Meats and other protein foods ?Beans, peas, and lentils. Tough or fatty meats. Fried meats, chicken, or fish. Fried eggs. Nuts and seeds. Crunchy nut butters. ?Dairy ?Chocolate milk. Yogurt with chunks of fruit, nuts, seeds, or coconut. Strong cheeses. ?Beverages ?Carbonated soft drinks. Alcohol. Cocoa. Hot drinks. ?Fats and oils ?Bacon fat. Lard. ?Sweets and desserts ?Chocolate. Candy with nuts, coconut, or seeds. Peppermint. Cookies. Cakes. Pie crust. ?Seasoning and other foods ?Heavy seasonings. Chili sauce. Ketchup. Barbecue sauce. Angie Fava. Horseradish. ?The items listed above may not be a complete list of foods and beverages to avoid. Contact a dietitian for more information. ?Summary ?Following this eating plan after a Nissen fundoplication is an important part of healing after surgery. ?After surgery, you will start with a clear liquid diet before you progress to full liquids and soft foods. You may need to eat soft foods for several weeks. ?Avoid eating foods that cause irritation, gas, nausea, diarrhea, or swelling or pressure in the abdomen (bloating), and avoid foods that are difficult to swallow. ?Talk with a dietitian about which dietary choices are best for you. ?This information is not intended to replace advice given to you by your health care provider. Make sure you discuss any questions you have with your health care provider. ?Document Revised: 10/07/2019 Document Reviewed: 10/07/2019 ?Elsevier Patient Education  ? [place]. ? ?

## 2021-08-08 IMAGING — CR DG CHEST 1V
1 series · 1 of 1 positions shown · non-contrast
Comparison: None.

CLINICAL DATA: Pain

EXAM:
CHEST  1 VIEW

[chest ap]
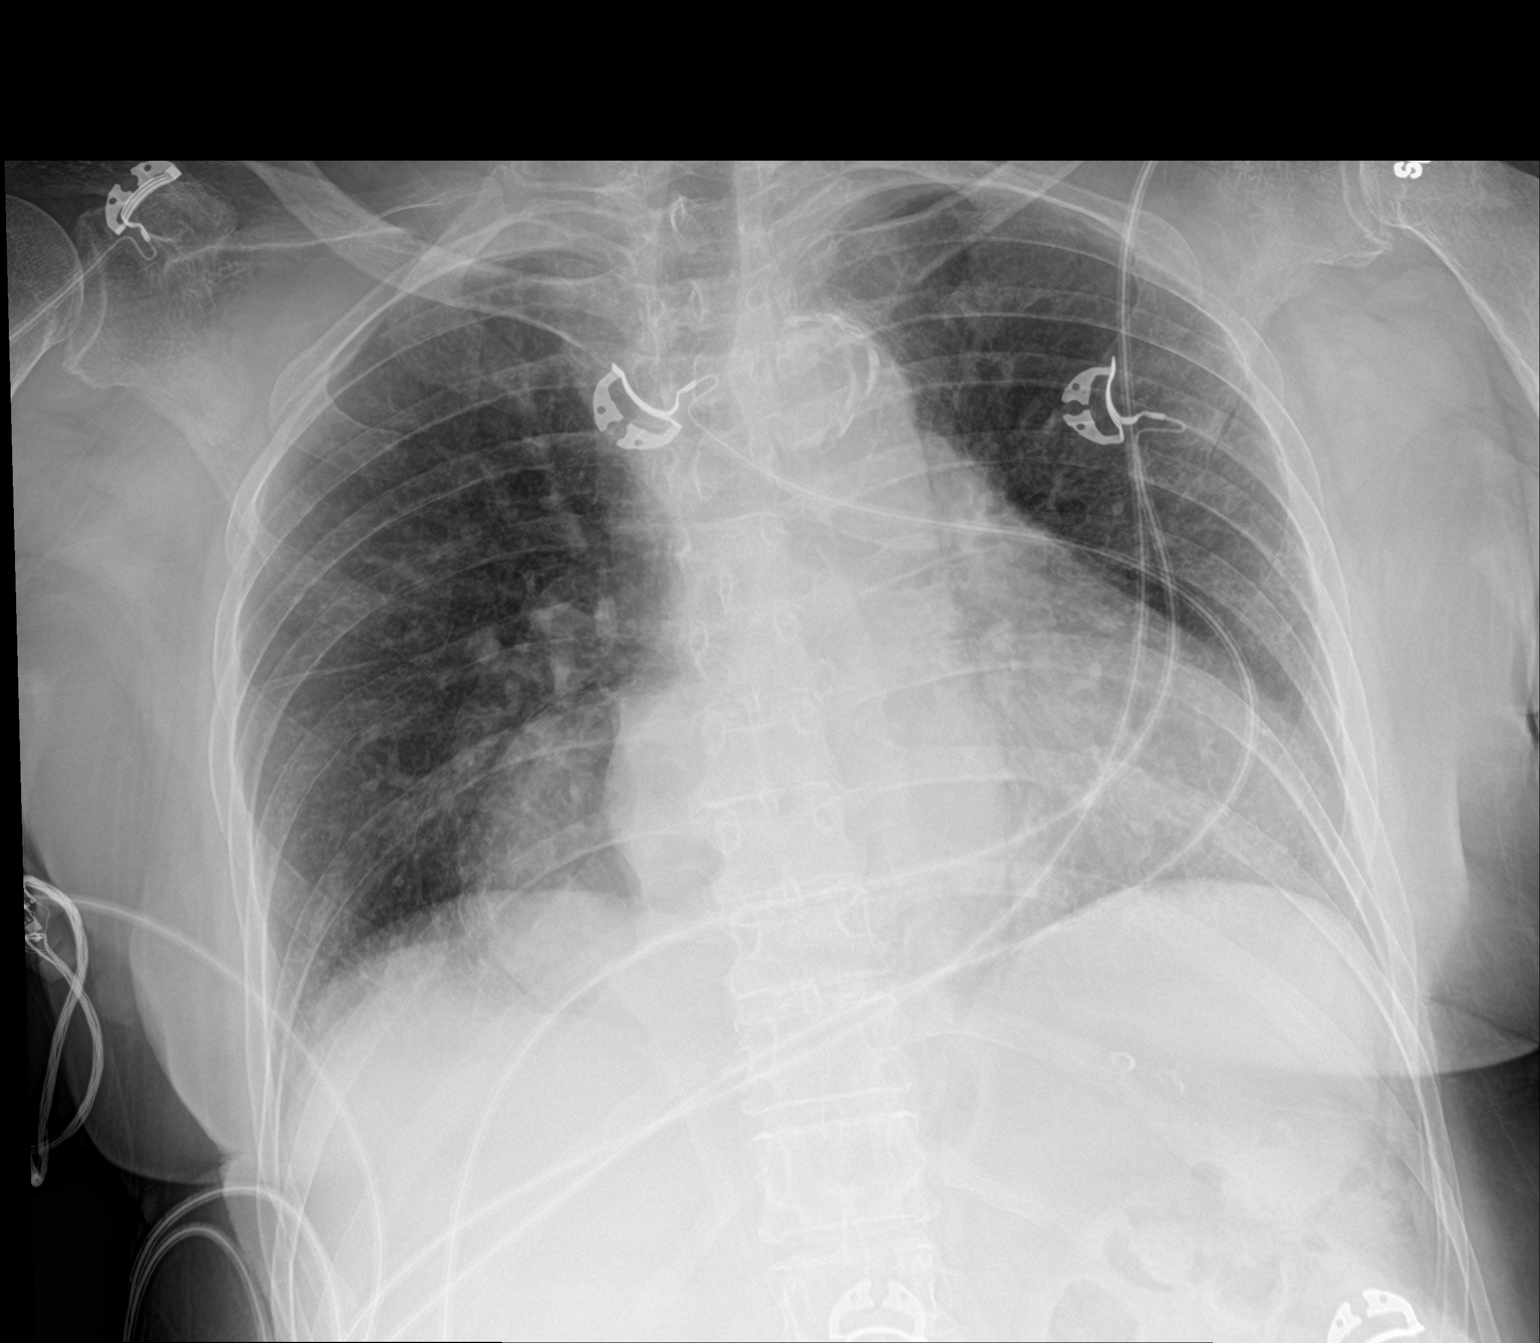

[1 of 1 positions shown; findings below may reference images not displayed]

FINDINGS: The heart size is enlarged. Aortic calcifications are noted. There
is no pneumothorax. No large pleural effusion. No focal infiltrate.
IMPRESSION: No active disease.

## 2021-09-11 ENCOUNTER — Telehealth: Payer: Self-pay | Admitting: Surgery

## 2021-09-11 NOTE — Telephone Encounter (Signed)
Patient calls, had paraesophageal hernia repair done on 04/07/21 with Dr. Dahlia Byes. Patient states that she is back to pretty  much eating what she wants.  Here lately has been having to burp a lot to relieve some of the pressure in her esophageal area.  States at times it really hurts.  Not sure if there is still restrictions that she should be doing with her food or if there is something that she can take to help her with this.  She is requesting for Freda Munro to call her please. Thank you.

## 2021-09-11 NOTE — Telephone Encounter (Signed)
04/2021 Paraesophageal Hernia repair- she complains of burping and still has some discomfort when swallowing. She denies eating gassy foods. She has tried Gas-X it seems to help sometimes. She feels the gas is trapped in the center of her chest. It doesn't happen everyday but is happening more.  Bowel movements are normal. Patient added to schedule We discussed trying tums to see if this helps with the gas.

## 2021-09-14 ENCOUNTER — Ambulatory Visit: Payer: Medicare HMO | Admitting: Surgery

## 2021-09-14 ENCOUNTER — Encounter: Payer: Self-pay | Admitting: Surgery

## 2021-09-14 VITALS — BP 135/70 | HR 45 | Temp 98.0°F | Ht 60.0 in | Wt 126.0 lb

## 2021-09-14 DIAGNOSIS — Z8719 Personal history of other diseases of the digestive system: Secondary | ICD-10-CM

## 2021-09-14 DIAGNOSIS — R142 Eructation: Secondary | ICD-10-CM | POA: Diagnosis not present

## 2021-09-14 DIAGNOSIS — R140 Abdominal distension (gaseous): Secondary | ICD-10-CM | POA: Diagnosis not present

## 2021-09-14 DIAGNOSIS — K2289 Other specified disease of esophagus: Secondary | ICD-10-CM

## 2021-09-14 NOTE — Patient Instructions (Addendum)
We will get you scheduled for a CT of the abdomen to check the surgical area.   You may take Gas X to see if this helps you with the gas.  We will see you back here in 1 month for follow up.   You are scheduled for a CT at [facility] on Tuesday June 27th. You will need to arrive at the [facility] at 8:00 am. You will need to have nothing to eat for 4 hours prior to your scan. You will need to pick up a prep kit.

## 2021-09-14 NOTE — Progress Notes (Signed)
Outpatient Surgical Follow Up  09/14/2021  Kelly Wood is an 81 y.o. female.   Chief Complaint  Patient presents with   Follow-up    HPI: This is a 81 year old female well-known to me with repair of paraesophageal hernia robotically 5 months ago.  Uneventful.  Comes with persistent bloating and eructation.  No fevers no chills.  Complains some retrosternal pain.  She is swallowing and she is eating but reports that she has some issues after work.  Burping seems to alleviate symptoms.  No fevers no chills.  Past Medical History:  Diagnosis Date   Anemia    Cancer ([place])    basal cell   COPD (chronic obstructive pulmonary disease) ([facility])    Dyspnea    GERD (gastroesophageal reflux disease)    Hyperlipidemia    Hypertension    Hypothyroidism     Past Surgical History:  Procedure Laterality Date   COLONOSCOPY N/A 06/23/2016   Procedure: COLONOSCOPY;  Surgeon: Manya Silvas, MD;  Location: [facility];  Service: Endoscopy;  Laterality: N/A;   FEMUR FRACTURE SURGERY Right 02/07/2019   rod placed   HARDWARE REMOVAL Right 11/06/2020   Procedure: EXCHANGE OF LAG SCREW, RIGHT HIP;  Surgeon: Corky Mull, MD;  Location: [facility] ORS;  Service: Orthopedics;  Laterality: Right;   INSERTION OF MESH  04/07/2021   Procedure: INSERTION OF MESH;  Surgeon: Jules Husbands, MD;  Location: [facility] ORS;  Service: General;;   INTRAMEDULLARY (IM) NAIL INTERTROCHANTERIC Right 12/26/2018   Procedure: INTRAMEDULLARY (IM) NAIL INTERTROCHANTRIC;  Surgeon: Corky Mull, MD;  Location: [facility] ORS;  Service: Orthopedics;  Laterality: Right;   LAPAROSCOPIC NISSEN FUNDOPLICATION  11/11/2117   Procedure: LAPAROSCOPIC NISSEN FUNDOPLICATION;  Surgeon: Jules Husbands, MD;  Location: [facility] ORS;  Service: General;;   TUBAL LIGATION  1970   XI ROBOTIC ASSISTED PARAESOPHAGEAL HERNIA REPAIR N/A 04/07/2021   Procedure: XI ROBOTIC ASSISTED PARAESOPHAGEAL HERNIA REPAIR with RNFA to assist;  Surgeon: Jules Husbands, MD;   Location: [facility] ORS;  Service: General;  Laterality: N/A;    Family History  Problem Relation Age of Onset   Breast cancer Neg Hx     Social History:  reports that she quit smoking about 14 years ago. Her smoking use included cigarettes. She has never used smokeless tobacco. She reports current alcohol use of about 1.0 standard drink of alcohol per week. She reports that she does not use drugs.  Allergies:  Allergies  Allergen Reactions   Azithromycin Diarrhea   Levofloxacin Diarrhea    Pt prefers not to take because gi side effects   Sulfa Antibiotics Rash    Medications reviewed.    ROS Full ROS performed and is otherwise negative other than what is stated in HPI   BP 135/70   Pulse (!) 45   Temp 98 F (36.7 C)   Ht 5' (1.524 m)   Wt 126 lb (57.2 kg)   SpO2 98%   BMI 24.61 kg/m   Physical Exam Vitals and nursing note reviewed. Exam conducted with a chaperone present.  Constitutional:      General: She is not in acute distress.    Appearance: Normal appearance. She is normal weight.  Cardiovascular:     Rate and Rhythm: Normal rate and regular rhythm.  Pulmonary:     Effort: Pulmonary effort is normal. No respiratory distress.     Breath sounds: Normal breath sounds. No stridor. No wheezing, rhonchi or rales.  Chest:  Chest wall: No tenderness.  Abdominal:     General: Abdomen is flat. There is no distension.     Palpations: Abdomen is soft. There is no mass.     Tenderness: There is no abdominal tenderness. There is no guarding or rebound.     Hernia: No hernia is present.     Comments: Robotic scars no hernias no infection  Musculoskeletal:     Cervical back: Normal range of motion and neck supple. No rigidity or tenderness.  Skin:    Capillary Refill: Capillary refill takes less than 2 seconds.  Neurological:     General: No focal deficit present.     Mental Status: She is alert and oriented to person, place, and time.  Psychiatric:        Mood and  Affect: Mood normal.        Behavior: Behavior normal.        Thought Content: Thought content normal.        Judgment: Judgment normal.      Assessment/Plan: 81 year old female status post paraesophageal hernia repair with mesh 5 months ago now with some persistent burping and bloating.  We will start work-up with a CT scan of the abdomen and pelvis.  May need a swallow evaluation again.  Currently she is not toxic does not need hospitalization.  May need to adjust diet accordingly. Please note that I spent greater than 30 minutes in this encounter including personally reviewing imaging studies, placing orders, coordinating care care, counseling the patient and performing appropriate documentation   [name], MD [place] Surgeon

## 2021-09-29 ENCOUNTER — Ambulatory Visit
Admission: RE | Admit: 2021-09-29 | Discharge: 2021-09-29 | Disposition: A | Discharge status: Home / Self Care | Payer: Medicare HMO | Source: Ambulatory Visit | Attending: Surgery | Admitting: Surgery

## 2021-09-29 DIAGNOSIS — Z8719 Personal history of other diseases of the digestive system: Secondary | ICD-10-CM | POA: Insufficient documentation

## 2021-09-29 DIAGNOSIS — K4090 Unilateral inguinal hernia, without obstruction or gangrene, not specified as recurrent: Secondary | ICD-10-CM | POA: Diagnosis not present

## 2021-09-29 DIAGNOSIS — K2289 Other specified disease of esophagus: Secondary | ICD-10-CM | POA: Diagnosis present

## 2021-09-29 DIAGNOSIS — R142 Eructation: Secondary | ICD-10-CM | POA: Insufficient documentation

## 2021-09-29 DIAGNOSIS — I700 Atherosclerosis of aorta: Secondary | ICD-10-CM | POA: Diagnosis not present

## 2021-09-29 DIAGNOSIS — J439 Emphysema, unspecified: Secondary | ICD-10-CM | POA: Insufficient documentation

## 2021-09-29 MED ORDER — IOHEXOL 300 MG/ML  SOLN
100.0000 mL | Freq: Once | INTRAMUSCULAR | Status: AC | PRN
  Administered 2021-09-29: 100 mL via INTRAVENOUS

## 2021-10-12 ENCOUNTER — Other Ambulatory Visit: Payer: Self-pay

## 2021-10-12 ENCOUNTER — Ambulatory Visit: Payer: Medicare HMO | Admitting: Surgery

## 2021-10-12 ENCOUNTER — Encounter: Payer: Self-pay | Admitting: Surgery

## 2021-10-12 ENCOUNTER — Telehealth: Payer: Self-pay | Admitting: Surgery

## 2021-10-12 VITALS — BP 161/85 | HR 43 | Temp 98.0°F | Ht 60.0 in | Wt 119.6 lb

## 2021-10-12 DIAGNOSIS — R0789 Other chest pain: Secondary | ICD-10-CM | POA: Diagnosis not present

## 2021-10-12 DIAGNOSIS — K2289 Other specified disease of esophagus: Secondary | ICD-10-CM

## 2021-10-12 DIAGNOSIS — K449 Diaphragmatic hernia without obstruction or gangrene: Secondary | ICD-10-CM

## 2021-10-12 DIAGNOSIS — R142 Eructation: Secondary | ICD-10-CM | POA: Diagnosis not present

## 2021-10-12 MED ORDER — METOCLOPRAMIDE HCL 5 MG PO TABS
5.0000 mg | ORAL_TABLET | Freq: Three times a day (TID) | ORAL | 0 refills | Status: DC | PRN
Start: 2021-10-12 — End: 2022-04-29

## 2021-10-12 MED ORDER — ONDANSETRON HCL 4 MG PO TABS: 4.0000 mg | ORAL_TABLET | Freq: Three times a day (TID) | ORAL | 0 refills | Status: DC | PRN

## 2021-10-12 NOTE — Patient Instructions (Addendum)
Avoid gassy foods. Do not drink carbonated beverages and bread.   We will fax Referral to [facility] for the Endoscopy.  Please pick up your medication at the pharmacy. I will schedule the Barium Swallow and call you with that information within a few days.     High-Protein and High-Calorie Diet Eating high-protein and high-calorie foods can help you to gain weight, heal after an injury, and recover after an illness or surgery. The specific amount of daily protein and calories you need depends on: Your body weight. The reason this diet is recommended for you. Generally, a high-protein, high-calorie diet involves: Eating 250-500 extra calories each day. Making sure that you get enough of your daily calories from protein. Ask your health care provider how many of your calories should come from protein. Talk with a health care provider or a dietitian about how much protein and how many calories you need each day. Follow the diet as directed by your health care provider. What are tips for following this plan?  Reading food labels Check the nutrition facts label for calories, grams of fat and protein. Items with more than 4 grams of protein are high-protein foods. Preparing meals Add whole milk, half-and-half, or heavy cream to cereal, pudding, soup, or hot cocoa. Add whole milk to instant breakfast drinks. Add peanut butter to oatmeal or smoothies. Add powdered milk to baked goods, smoothies, or milkshakes. Add powdered milk, cream, or butter to mashed potatoes. Add cheese to cooked vegetables. Make whole-milk yogurt parfaits. Top them with granola, fruit, or nuts. Add cottage cheese to fruit. Add avocado, cheese, or both to sandwiches or salads. Add avocado to smoothies. Add meat, poultry, or seafood to rice, pasta, casseroles, salads, and soups. Use mayonnaise when making egg salad, chicken salad, or tuna salad. Use peanut butter as a dip for fruits and vegetables or as a topping  for pretzels, celery, or crackers. Add beans to casseroles, dips, and spreads. Add pureed beans to sauces and soups. Replace calorie-free drinks with calorie-containing drinks, such as milk and fruit juice. Replace water with milk or heavy cream when making foods such as oatmeal, pudding, or cocoa. Add oil or butter to cooked vegetables and grains. Add cream cheese to sandwiches or as a topping on crackers and bread. Make cream-based pastas and soups. General information Ask your health care provider if you should take a nutritional supplement. Try to eat six small meals each day instead of three large meals. A general goal is to eat every 2 to 3 hours. Eat a balanced diet. In each meal, include one food that is high in protein and one food with fat in it. Keep nutritious snacks available, such as nuts, trail mixes, dried fruit, and yogurt. If you have kidney disease or diabetes, talk with your health care provider about how much protein is safe for you. Too much protein may put extra stress on your kidneys. Drink your calories. Choose high-calorie drinks and have them after your meals. Consider setting a timer to remind you to eat. You will want to eat even if you do not feel very hungry. What high-protein foods should I eat?  Vegetables Soybeans. Peas. Grains Quinoa. Bulgur wheat. Buckwheat. Meats and other proteins Beef, pork, and poultry. Fish and seafood. Eggs. Tofu. Textured vegetable protein (TVP). Peanut butter. Nuts and seeds. Dried beans. Protein powders. Hummus. Dairy Whole milk. Whole-milk yogurt. Powdered milk. Cheese. Yahoo. Eggnog. Beverages High-protein supplement drinks. Soy milk. Other foods Protein bars. The items  listed above may not be a complete list of foods and beverages you can eat and drink. Contact a dietitian for more information. What high-calorie foods should I eat? Fruits Dried fruit. Fruit leather. Canned fruit in syrup. Fruit juice.  Avocado. Vegetables Vegetables cooked in oil or butter. Fried potatoes. Grains Pasta. Quick breads. Muffins. Pancakes. Ready-to-eat cereal. Meats and other proteins Peanut butter. Nuts and seeds. Dairy Heavy cream. Whipped cream. Cream cheese. Sour cream. Ice cream. Custard. Pudding. Whole milk dairy products. Beverages Meal-replacement beverages. Nutrition shakes. Fruit juice. Seasonings and condiments Salad dressing. Mayonnaise. Alfredo sauce. Fruit preserves or jelly. Honey. Syrup. Sweets and desserts Cake. Cookies. Pie. Pastries. Candy bars. Chocolate. Fats and oils Butter or margarine. Oil. Gravy. Other foods Meal-replacement bars. The items listed above may not be a complete list of foods and beverages you can eat and drink. Contact a dietitian for more information. Summary A high-protein, high-calorie diet can help you gain weight or heal faster after an injury, illness, or surgery. To increase your protein and calories, add ingredients such as whole milk, peanut butter, cheese, beans, meat, or seafood to meal items. To get enough extra calories each day, include high-calorie foods and beverages at each meal. Adding a high-calorie drink or shake can be an easy way to help you get enough calories each day. Talk with your healthcare provider or dietitian about the best options for you. This information is not intended to replace advice given to you by your health care provider. Make sure you discuss any questions you have with your health care provider. Document Revised: 02/24/2020 Document Reviewed: 02/24/2020 Elsevier Patient Education  [place].

## 2021-10-12 NOTE — Telephone Encounter (Signed)
Spoke with patient.she  has decided to not take the medications that Dr.Pabon prescribed. We discussed high protein diet. She stated she would continue to use the Gas X.

## 2021-10-12 NOTE — Telephone Encounter (Signed)
Patient calls, she was seen today  by Dr. Dahlia Byes and was prescribed two medications,  Metoclorpramide 5 mg and Ondansetron hzl 4 mg and wants to know what these are for.  Please call her.  Thank you.

## 2021-10-14 ENCOUNTER — Encounter: Payer: Self-pay | Admitting: Surgery

## 2021-10-14 ENCOUNTER — Ambulatory Visit: Admission: RE | Admit: 2021-10-14 | Payer: Medicare HMO | Source: Ambulatory Visit

## 2021-10-14 NOTE — Progress Notes (Signed)
Outpatient Surgical Follow Up  10/14/2021  Kelly Wood is an 81 y.o. female.   Chief Complaint  Patient presents with   Routine Post Op    Nissen     HPI: Kelly Wood is a 81 year old female well-known to me with repair of paraesophageal hernia robotically 6 months ago.  Uneventful.  Comes with persistent bloating and eructation, as well as chest pain.  No fevers no chills.  Complains some retrosternal pain.  She is swallowing and she is eating but reports that she has some issues   Burping seems to alleviate symptoms.  No fevers no chills. He did have a CT scan that I personally reviewed and also shown the images to the patient.  There is evidence of a sliding recurrence this is radiographically.  When compared to the prior one before she had an upset stomach with a large type III paraesophageal hernia.  The wrap seems to be intact.  There is no evidence of perforation or other complications.  Past Medical History:  Diagnosis Date   Anemia    [place] ([place]    basal cell   COPD (chronic obstructive pulmonary disease) (HCC)    Dyspnea    GERD (gastroesophageal reflux disease)    Hyperlipidemia    Hypertension    Hypothyroidism     Past Surgical History:  Procedure Laterality Date   COLONOSCOPY N/A 06/23/2016   Procedure: COLONOSCOPY;  Surgeon: Manya Silvas, MD;  Location: [place];  Service: Endoscopy;  Laterality: N/A;   FEMUR FRACTURE SURGERY Right 02/07/2019   rod placed   HARDWARE REMOVAL Right 11/06/2020   Procedure: EXCHANGE OF LAG SCREW, RIGHT HIP;  Surgeon: Corky Mull, MD;  Location: [facility];  Service: Orthopedics;  Laterality: Right;   INSERTION OF MESH  04/07/2021   Procedure: INSERTION OF MESH;  Surgeon: Jules Husbands, MD;  Location: [facility];  Service: General;;   INTRAMEDULLARY (IM) NAIL INTERTROCHANTERIC Right 12/26/2018   Procedure: INTRAMEDULLARY (IM) NAIL INTERTROCHANTRIC;  Surgeon: Corky Mull, MD;  Location: [facility];  Service: Orthopedics;   Laterality: Right;   LAPAROSCOPIC NISSEN FUNDOPLICATION  09/05/6946   Procedure: LAPAROSCOPIC NISSEN FUNDOPLICATION;  Surgeon: Jules Husbands, MD;  Location: [facility];  Service: General;;   TUBAL LIGATION  1970   XI ROBOTIC ASSISTED PARAESOPHAGEAL HERNIA REPAIR N/A 04/07/2021   Procedure: XI ROBOTIC ASSISTED PARAESOPHAGEAL HERNIA REPAIR with RNFA to assist;  Surgeon: Jules Husbands, MD;  Location: [facility];  Service: General;  Laterality: N/A;    Family History  Problem Relation Age of Onset   Breast cancer Neg Hx     Social History:  reports that she quit smoking about 14 years ago. Her smoking use included cigarettes. She has never used smokeless tobacco. She reports current alcohol use of about 1.0 standard drink of alcohol per week. She reports that she does not use drugs.  Allergies:  Allergies  Allergen Reactions   Azithromycin Diarrhea   Levofloxacin Diarrhea    Pt prefers not to take because gi side effects   Sulfa Antibiotics Rash    Medications reviewed.    ROS Full ROS performed and is otherwise negative other than what is stated in HPI   BP (!) 161/85   Pulse (!) 43   Temp 98 F (36.7 C) (Oral)   Ht 5' (1.524 m)   Wt 119 lb 9.6 oz (54.3 kg)   SpO2 100%   BMI 23.36 kg/m   Physical Exam Vitals and nursing note  reviewed. Exam conducted with a chaperone present.  Constitutional:      General: She is not in acute distress.    Appearance: Normal appearance. She is not ill-appearing.  Eyes:     General: No scleral icterus.       Right eye: No discharge.        Left eye: No discharge.  Neck:     Vascular: No carotid bruit.  Cardiovascular:     Rate and Rhythm: Normal rate and regular rhythm.  Pulmonary:     Effort: Pulmonary effort is normal. No respiratory distress.     Breath sounds: Normal breath sounds. No stridor. No wheezing or rhonchi.  Abdominal:     General: Abdomen is flat. There is no distension.     Palpations: Abdomen is soft. There is no mass.      Tenderness: There is no abdominal tenderness. There is no guarding or rebound.     Hernia: No hernia is present.     Comments: Robotic incisions healing well without evidence of infection  Musculoskeletal:        General: Normal range of motion.     Cervical back: Normal range of motion and neck supple. No rigidity or tenderness.  Lymphadenopathy:     Cervical: No cervical adenopathy.  Skin:    General: Skin is warm and dry.     Capillary Refill: Capillary refill takes less than 2 seconds.  Neurological:     General: No focal deficit present.     Mental Status: She is alert and oriented to person, place, and time.  Psychiatric:        Mood and Affect: Mood normal.        Behavior: Behavior normal.        Thought Content: Thought content normal.        Judgment: Judgment normal.     Assessment/Plan: 81 year old female with a radiographic recurrence sliding hiatal hernia.  There is a significantly better as compared to prior surgery.  Before she did have an upside down stomach and a type III paraesophageal hernia.  Currently her main symptom is eructation and chest pain. Gust with the patient detail will like to try some Zofran and as needed Reglan.  We will also need to start repeat work-up including upper scope and barium swallow. I do not necessarily want to go back and she does not want necessarily have to have a redo surgery.  She is getting ready to [place] time at the beach with the [place] and she wishes to wait until she comes back to pursue further work-up.  Currently she is not toxic I also discussed with her that our priority would be to focus on appropriate nutrition with high protein drinks and protein diet.  She seems to like carbohydrates and does not seem to care for fish or good protein diet.  I had an extensive discussion with her and her husband regarding appropriate measures to avoid malnutrition.  They seem to be understanding.  I will see her back once she  completes appropriate work-up.  Please note that I spent 40 minutes in this encounter including personally reviewing imaging studies, counseling the patient and performing appropriate documentation.   [name], MD [facility] General Surgeon

## 2021-10-15 ENCOUNTER — Telehealth: Payer: Self-pay

## 2021-10-15 NOTE — Telephone Encounter (Signed)
Patient has questions regarding Zofran.Patient stated she has taken the Zofran and it helps with the nausea.

## 2021-10-21 ENCOUNTER — Telehealth: Payer: Self-pay | Admitting: *Deleted

## 2021-10-21 NOTE — Telephone Encounter (Signed)
Patient called and we referred her to Dr.Toledo for a upper endoscopy and the first available appointment for a consultation is in October, patient doesn't want to wait that long for an appointment and didn't know if we can either refer her to someone else or if we can try to get her in earlier. Please call and advise

## 2021-10-23 NOTE — Telephone Encounter (Signed)
Left message on Patients VM -I spoke with [name] at Dr.Toledo's office to see if we can get her EGD done before October- [name] is sending a message to the nurse and will reach out to the patient.

## 2021-10-26 ENCOUNTER — Telehealth: Payer: Self-pay | Admitting: *Deleted

## 2021-10-26 NOTE — Telephone Encounter (Signed)
Patient called to let us know that she now has an appointment with Dr Alice Reichert on August 15th for a consultation.

## 2021-10-27 ENCOUNTER — Other Ambulatory Visit: Payer: Self-pay | Admitting: Surgery

## 2021-10-27 ENCOUNTER — Telehealth: Payer: Self-pay

## 2021-10-27 ENCOUNTER — Ambulatory Visit
Admission: RE | Admit: 2021-10-27 | Discharge: 2021-10-27 | Disposition: A | Discharge status: Home / Self Care | Payer: Medicare HMO | Source: Ambulatory Visit | Attending: Surgery | Admitting: Surgery

## 2021-10-27 DIAGNOSIS — K2289 Other specified disease of esophagus: Secondary | ICD-10-CM | POA: Insufficient documentation

## 2021-10-27 NOTE — Telephone Encounter (Signed)
Dr. Dahlia Byes wanted patient to be scheduled for an EGD to be performed on patient. However, patient currently has an appointment scheduled with [facility]. His office was notified by patient.

## 2021-11-05 ENCOUNTER — Other Ambulatory Visit: Payer: Self-pay

## 2021-11-05 MED ORDER — ONDANSETRON HCL 4 MG PO TABS: 4.0000 mg | ORAL_TABLET | Freq: Three times a day (TID) | ORAL | 0 refills | Status: DC | PRN

## 2021-11-09 ENCOUNTER — Ambulatory Visit: Payer: Medicare HMO | Admitting: Surgery

## 2021-11-30 ENCOUNTER — Ambulatory Visit: Payer: Medicare HMO | Admitting: Surgery

## 2021-11-30 ENCOUNTER — Encounter: Payer: Self-pay | Admitting: Surgery

## 2021-11-30 VITALS — BP 148/67 | HR 48 | Temp 98.2°F | Wt 117.0 lb

## 2021-11-30 DIAGNOSIS — K449 Diaphragmatic hernia without obstruction or gangrene: Secondary | ICD-10-CM | POA: Diagnosis not present

## 2021-11-30 NOTE — Patient Instructions (Signed)
Please see your follow up appointment listed below.  °

## 2021-12-01 ENCOUNTER — Encounter: Payer: Self-pay | Admitting: Surgery

## 2021-12-01 NOTE — Progress Notes (Signed)
Outpatient Surgical Follow Up  12/01/2021  Kelly Wood is an 81 y.o. female.   Chief Complaint  Patient presents with   Follow-up    Hiatal hernia    HPI: Is a 81 year old female well-known to me with prior history of giant paraesophageal hernia so 9 months ago now with a recurrence and potential slipped Nissen.  When a recent EGD that have personally reviewed.  There is evidence of a paraesophageal hernia.  She actually feels better after she underwent the EGD by Dr. Alice Reichert.  She is tolerating diet and she is having bowel movements.  She is taking adequate p.o. intake.  No fevers no chills.  She did have a recent barium swallow showing evidence of recurrent paraesophageal hernia with questionable slipped Nissen. She denies any dysphagia   Past Medical History:  Diagnosis Date   Anemia    Cancer ([place])    basal cell   COPD (chronic obstructive pulmonary disease) (HCC)    Dyspnea    GERD (gastroesophageal reflux disease)    Hyperlipidemia    Hypertension    Hypothyroidism     Past Surgical History:  Procedure Laterality Date   COLONOSCOPY N/A 06/23/2016   Procedure: COLONOSCOPY;  Surgeon: Manya Silvas, MD;  Location: [place];  Service: Endoscopy;  Laterality: N/A;   FEMUR FRACTURE SURGERY Right 02/07/2019   rod placed   HARDWARE REMOVAL Right 11/06/2020   Procedure: EXCHANGE OF LAG SCREW, RIGHT HIP;  Surgeon: Corky Mull, MD;  Location: [facility];  Service: Orthopedics;  Laterality: Right;   INSERTION OF MESH  04/07/2021   Procedure: INSERTION OF MESH;  Surgeon: Jules Husbands, MD;  Location: [facility];  Service: General;;   INTRAMEDULLARY (IM) NAIL INTERTROCHANTERIC Right 12/26/2018   Procedure: INTRAMEDULLARY (IM) NAIL INTERTROCHANTRIC;  Surgeon: Corky Mull, MD;  Location: [facility];  Service: Orthopedics;  Laterality: Right;   LAPAROSCOPIC NISSEN FUNDOPLICATION  06/08/6292   Procedure: LAPAROSCOPIC NISSEN FUNDOPLICATION;  Surgeon: Jules Husbands, MD;  Location:  [facility];  Service: General;;   TUBAL LIGATION  1970   XI ROBOTIC ASSISTED PARAESOPHAGEAL HERNIA REPAIR N/A 04/07/2021   Procedure: XI ROBOTIC ASSISTED PARAESOPHAGEAL HERNIA REPAIR with RNFA to assist;  Surgeon: Jules Husbands, MD;  Location: [facility];  Service: General;  Laterality: N/A;    Family History  Problem Relation Age of Onset   Breast cancer Neg Hx     Social History:  reports that she quit smoking about 15 years ago. Her smoking use included cigarettes. She has never used smokeless tobacco. She reports current alcohol use of about 1.0 standard drink of alcohol per week. She reports that she does not use drugs.  Allergies:  Allergies  Allergen Reactions   Azithromycin Diarrhea   Levofloxacin Diarrhea    Pt prefers not to take because gi side effects   Sulfa Antibiotics Rash    Medications reviewed.    ROS Full ROS performed and is otherwise negative other than what is stated in HPI   BP (!) 148/67   Pulse (!) 48   Temp 98.2 F (36.8 C) (Oral)   Wt 117 lb (53.1 kg)   SpO2 99%   BMI 22.85 kg/m   Physical Exam Vitals and nursing note reviewed. Exam conducted with a chaperone present.  Constitutional:      General: She is not in acute distress.    Appearance: Normal appearance.  Cardiovascular:     Rate and Rhythm: Normal rate and regular rhythm.  Pulmonary:     Effort: Pulmonary effort is normal. No respiratory distress.     Breath sounds: Normal breath sounds. No stridor. No wheezing.  Abdominal:     General: Abdomen is flat. There is no distension.     Palpations: There is no mass.     Tenderness: There is no abdominal tenderness. There is no guarding or rebound.     Hernia: No hernia is present.  Musculoskeletal:        General: Normal range of motion.     Cervical back: Normal range of motion and neck supple. No rigidity.  Skin:    General: Skin is warm and dry.  Neurological:     Mental Status: She is alert.  Psychiatric:        Mood and  Affect: Mood normal.        Behavior: Behavior normal.        Thought Content: Thought content normal.        Judgment: Judgment normal.     Assessment/Plan: ReCurrent paraesophageal hernia.  Discussed with the patient in detail about her disease process.  I do think that at some point in time this needs to be revised and likely either redo the Nissen and potentially and likely perform a gastropexy to decrease the chance of recurrence.  Discussed with her and also the husband about her disease process.  She wishes to wait a bit longer.  I do agree that since she is doing well and having good nutritional support there is no specifically any rush to get this done.  Come back in about a month or so and time we will pencil down a definitive surgical date.  Discussed with her in detail about redo paraesophageal hernia repair.  Risks, benefits and possible implications.  They understand and are in agreement.  Please note that I spent 40 minutes in this encounter including personally reviewing imaging studies, coordinating her care, placing orders and performing appropriate documentation    [name], MD [facility] Surgeon

## 2021-12-14 ENCOUNTER — Encounter: Payer: Self-pay | Admitting: Surgery

## 2021-12-14 ENCOUNTER — Ambulatory Visit: Payer: Medicare HMO | Admitting: Surgery

## 2021-12-14 ENCOUNTER — Other Ambulatory Visit: Payer: Self-pay

## 2021-12-14 VITALS — BP 135/76 | HR 46 | Temp 98.0°F | Ht 60.0 in | Wt 118.0 lb

## 2021-12-14 DIAGNOSIS — K449 Diaphragmatic hernia without obstruction or gangrene: Secondary | ICD-10-CM

## 2021-12-14 NOTE — Patient Instructions (Addendum)
Please see your follow up appointment listed below.  °

## 2021-12-15 NOTE — Progress Notes (Signed)
Outpatient Surgical Follow Up  12/15/2021  Kelly Wood is an 81 y.o. female.   Chief Complaint  Patient presents with   Follow-up    Discuss surgery    HPI: Kelly Wood is a 81 year old female well-known to me with repair of paraesophageal hernia robotically 8 months ago.  Uneventful.  Comes with persistent bloating and eructation, as well as chest pain.  No fevers no chills.  Complains some retrosternal pain.  She is swallowing and she is eating .  Burping seems to alleviate symptoms.  No fevers no chills. SHe did have a CT scan that I personally reviewed and also shown the images to the patient.  There is evidence of a sliding recurrence this is radiographically.  When compared to the prior one before she had an upside stomach with a large type III paraesophageal hernia.  The wrap seems to be intact.  There is no evidence of perforation or other complications. Wrap seems to be migrating to the mediastinum  Past Medical History:  Diagnosis Date   Anemia    Cancer ([place])    basal cell   COPD (chronic obstructive pulmonary disease) (HCC)    Dyspnea    GERD (gastroesophageal reflux disease)    Hyperlipidemia    Hypertension    Hypothyroidism     Past Surgical History:  Procedure Laterality Date   COLONOSCOPY N/A 06/23/2016   Procedure: COLONOSCOPY;  Surgeon: Manya Silvas, MD;  Location: [place];  Service: Endoscopy;  Laterality: N/A;   FEMUR FRACTURE SURGERY Right 02/07/2019   rod placed   HARDWARE REMOVAL Right 11/06/2020   Procedure: EXCHANGE OF LAG SCREW, RIGHT HIP;  Surgeon: Corky Mull, MD;  Location: [facility];  Service: Orthopedics;  Laterality: Right;   INSERTION OF MESH  04/07/2021   Procedure: INSERTION OF MESH;  Surgeon: Jules Husbands, MD;  Location: [facility];  Service: General;;   INTRAMEDULLARY (IM) NAIL INTERTROCHANTERIC Right 12/26/2018   Procedure: INTRAMEDULLARY (IM) NAIL INTERTROCHANTRIC;  Surgeon: Corky Mull, MD;  Location: [facility];  Service:  Orthopedics;  Laterality: Right;   LAPAROSCOPIC NISSEN FUNDOPLICATION  04/09/4006   Procedure: LAPAROSCOPIC NISSEN FUNDOPLICATION;  Surgeon: Jules Husbands, MD;  Location: [facility];  Service: General;;   TUBAL LIGATION  1970   XI ROBOTIC ASSISTED PARAESOPHAGEAL HERNIA REPAIR N/A 04/07/2021   Procedure: XI ROBOTIC ASSISTED PARAESOPHAGEAL HERNIA REPAIR with RNFA to assist;  Surgeon: Jules Husbands, MD;  Location: [facility];  Service: General;  Laterality: N/A;    Family History  Problem Relation Age of Onset   Breast cancer Neg Hx     Social History:  reports that she quit smoking about 15 years ago. Her smoking use included cigarettes. She has never used smokeless tobacco. She reports current alcohol use of about 1.0 standard drink of alcohol per week. She reports that she does not use drugs.  Allergies:  Allergies  Allergen Reactions   Azithromycin Diarrhea   Levofloxacin Diarrhea    Pt prefers not to take because gi side effects   Sulfa Antibiotics Rash    Medications reviewed.    ROS Full ROS performed and is otherwise negative other than what is stated in HPI   BP 135/76   Pulse (!) 46   Temp 98 F (36.7 C) (Oral)   Ht 5' (1.524 m)   Wt 118 lb (53.5 kg)   SpO2 99%   BMI 23.05 kg/m   Physical Exam    Vitals and nursing note reviewed.  Exam conducted with a chaperone present.  Constitutional:      General: She is not in acute distress.    Appearance: Normal appearance. She is not ill-appearing.  Eyes:     General: No scleral icterus.       Right eye: No discharge.        Left eye: No discharge.  Neck:     Vascular: No carotid bruit.  Cardiovascular:     Rate and Rhythm: Normal rate and regular rhythm.  Pulmonary:     Effort: Pulmonary effort is normal. No respiratory distress.     Breath sounds: Normal breath sounds. No stridor. No wheezing or rhonchi.  Abdominal:     General: Abdomen is flat. There is no distension.     Palpations: Abdomen is soft. There is  no mass.     Tenderness: There is no abdominal tenderness. There is no guarding or rebound.     Hernia: No hernia is present.     Comments: Robotic incisions completely healed musculoskeletal:        General: Normal range of motion.     Cervical back: Normal range of motion and neck supple. No rigidity or tenderness.  Lymphadenopathy:     Cervical: No cervical adenopathy.  Skin:    General: Skin is warm and dry.     Capillary Refill: Capillary refill takes less than 2 seconds.  Neurological:     General: No focal deficit present.     Mental Status: She is alert and oriented to person, place, and time.  Psychiatric:        Mood and Affect: Mood normal.        Behavior: Behavior normal.        Thought Content: Thought content normal.        Judgment: Judgment normal.    Assessment/Plan: 81 year old female with recurrent paraesophageal hernia sliding-type.  The Nissen fundoplication has seem to be migrated into the mediastinum.  Does not have an upside stomach anymore on the paraesophageal hernia is not giant as per to 1 year ago prior to her surgery.  Her symptoms are improving but she still has some persistent issues.  I had an extensive discussion with the patient and the family her disease process.  She definitely does not want attempt any surgical intervention in the next couple months.  Gust with her that I think it is unable to wait for a few months given that her symptoms have improved.  Were to have worsening symptoms we may have to speed up her surgical intervention.  This time around I do think that I will do a gastropexy to prevent migration of the stomach into the mediastinum.  She discussed with him in detail.  Please note that I spent 40 minutes in this encounter including personally reviewing imaging studies, coordinating her care, placing orders and performing appropriate documentation  [name], MD [place] Surgeon

## 2021-12-17 ENCOUNTER — Other Ambulatory Visit: Payer: Self-pay | Admitting: Internal Medicine

## 2021-12-17 DIAGNOSIS — Z1231 Encounter for screening mammogram for malignant neoplasm of breast: Secondary | ICD-10-CM

## 2022-01-07 ENCOUNTER — Ambulatory Visit
Admission: RE | Admit: 2022-01-07 | Discharge: 2022-01-07 | Disposition: A | Discharge status: Home / Self Care | Payer: Medicare HMO | Source: Ambulatory Visit | Attending: Internal Medicine | Admitting: Internal Medicine

## 2022-01-07 DIAGNOSIS — Z1231 Encounter for screening mammogram for malignant neoplasm of breast: Secondary | ICD-10-CM | POA: Insufficient documentation

## 2022-02-01 ENCOUNTER — Other Ambulatory Visit: Payer: Self-pay

## 2022-02-01 ENCOUNTER — Ambulatory Visit: Payer: Medicare HMO | Admitting: Surgery

## 2022-02-01 ENCOUNTER — Encounter: Payer: Self-pay | Admitting: Surgery

## 2022-02-01 VITALS — BP 127/77 | HR 47 | Temp 98.9°F | Ht 60.0 in | Wt 117.0 lb

## 2022-02-01 DIAGNOSIS — R140 Abdominal distension (gaseous): Secondary | ICD-10-CM

## 2022-02-01 DIAGNOSIS — R142 Eructation: Secondary | ICD-10-CM

## 2022-02-01 MED ORDER — ONDANSETRON HCL 4 MG PO TABS: 4.0000 mg | ORAL_TABLET | Freq: Three times a day (TID) | ORAL | 0 refills | Status: DC | PRN

## 2022-02-01 NOTE — Patient Instructions (Addendum)
Try the Zofran medication to see if this helps with the symptoms.  Pick up your medication at the pharmacy.   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 Your surgery will be scheduled at [facility] with Dr. Dahlia Byes.  Plan to be in the hospital for 1-2 days if the minimally invasive surgery is completed without having to make a bigger incision. If the bigger incision is made, you will most likely need to be in the hospital 4-6 days. You will be on a soft diet and need to recover for 2 weeks following your surgery prior to doing any of your normal activities. At the 2 week mark, we will see you in the office and if you are doing ok we will advance your diet and activity level as you tolerate.  Please see your Blue (Pre-care) Sheet for more information regarding your surgery. Our surgery scheduler will call you to verify surgery date and to go over information  You will need to arrange to be out of work for approximately 1-2 weeks and then you may return with a lifting restriction for 4 more weeks. If you have FMLA or Disability paperwork that needs to be filled out, please have your company fax your paperwork to [phone] or you may drop this by either office. This paperwork will be filled out within 3 days after your surgery has been completed.  Please call our office with any questions or concerns that you have regarding your surgery and recovery.    Laparoscopic Nissen Fundoplication Laparoscopic Nissen fundoplication is surgery to relieve heartburn and other problems caused by gastric fluids flowing up into your esophagus. The esophagus is the tube that carries food and liquid from your throat to your stomach. Normally, the muscle that sits between your stomach and your esophagus (lower esophageal sphincter or LES) keeps stomach fluids in your stomach. In some people, the LES does not work properly, and stomach fluids flow up into the esophagus. This can happen when part of the stomach  bulges through the LES (hiatal hernia). The backward flow of stomach fluids can cause a type of severe and long-standing heartburn that is called gastroesophageal reflux disease (GERD). You may need this surgery if other treatments for GERD have not helped. In a laparoscopic Nissen fundoplication, the upper part of your stomach is wrapped around the lower part of your esophagus to strengthen the LES and prevent reflux. If you have a hiatal hernia, it will also be repaired with this surgery. The procedure is done through several small incisions in your abdomen. It is performed using a thin, telescopic instrument (laparoscope) and other instruments that can pass through the scope or through other small incisions. Tell a health care provider about: Any allergies you have. All medicines you are taking, including vitamins, herbs, eye drops, creams, and over-the-counter medicines. Any problems you or family members have had with anesthetic medicines. Any blood disorders you have. Any surgeries you have had. Any medical conditions you have. What are the risks? Generally, this is a safe procedure. However, problems may occur, including: Difficulty swallowing (dysphagia). Bloating. Nausea or vomiting. Damage to the lung, causing a collapsed lung. Infection or bleeding. What happens before the procedure? Ask your health care provider about: Changing or stopping your regular medicines. This is especially important if you are taking diabetes medicines or blood thinners. Taking medicines such as aspirin and ibuprofen. These medicines can thin your blood. Do not take these medicines before your procedure if your health care  provider asks you not to. Follow your health care provider's instructions about eating or drinking restrictions. Plan to have someone take you home after the procedure. What happens during the procedure? An IV tube will be inserted into one of your veins. It will be used to give you  fluids and medicines during the procedure. You will be given a medicine that makes you fall asleep (general anesthetic). Your abdomen will be cleaned with a germ-killing solution (antiseptic). The surgeon will make a small incision in your abdomen and insert a tube through the incision. Your abdomen will be filled with a gas. This helps the surgeon to see your organs more easily and it makes more space to work. The surgeon will insert the laparoscope through the incision. The scope has a camera that will send pictures to a monitor in the operating room. The surgeon will make several other small incisions in your abdomen to insert the other instruments that are needed during the procedure. Another instrument (dilator) will be passed through your mouth and down your esophagus into the upper part of your stomach. The dilator will prevent your LES from being closed too tightly during surgery. The surgeon will pass the top portion of your stomach behind the lower part of your esophagus and wrap it all the way around. This will be stitched into place. If you have a hiatal hernia, it will be repaired during this procedure. All instruments will be removed, and your incisions will be closed under your skin with stitches (sutures). Skin adhesive strips may also be used. A bandage (dressing) will be placed on your skin over the incisions. The procedure may vary among health care providers and hospitals. What happens after the procedure? You will be moved to a recovery area. Your blood pressure, heart rate, breathing rate, and blood oxygen level will be monitored often until the medicines you were given have worn off. You will be given pain medicine as needed. Your IV tube will be kept in until you are able to drink fluids. This information is not intended to replace advice given to you by your health care provider. Make sure you discuss any questions you have with your health care provider. Document  Released: 04/12/2014 Document Revised: 08/28/2015 Document Reviewed: 11/21/2013 Elsevier Interactive Patient Education  2017 [employer] Inc.   Laparoscopic Nissen Fundoplication, Care After Refer to this sheet in the next few weeks. These instructions provide you with information about caring for yourself after your procedure. Your health care provider may also give you more specific instructions. Your treatment has been planned according to current medical practices, but problems sometimes occur. Call your health care provider if you have any problems or questions after your procedure. What can I expect after the procedure? After the procedure, it is common to have: Difficulty swallowing (dysphagia). Excess gas (bloating). Follow these instructions at home: Medicines  Take medicines only as directed by your health care provider. Do not drive or operate heavy machinery while taking pain medicine. Incision care  There are many different ways to close and cover an incision, including stitches (sutures), skin glue, and adhesive strips. Follow your health care provider's instructions about: Incision care. Bandage (dressing) changes and removal. Incision closure removal. Check your incision areas every day for signs of infection. Watch for: Redness, swelling, or pain. Fluid, blood, or pus. Do not take baths, swim, or use a hot tub until your health care provider approves. Take showers as directed by your health care provider. Eating and  drinking  Follow your health care provider's instructions about eating. You may need to follow a very soft diet for 2 weeks, followed by a diet of more regular foods for 2 weeks, no breads. You should return to your usual diet gradually. Drink enough fluid to keep your urine clear or pale yellow. Activity  Return to your normal activities as directed by your health care provider. Ask your health care provider what activities are safe for you. Avoid strenuous  exercise. Do not lift anything that is heavier than 10 lb (4.5 kg). Ask your health care provider when you can: Return to sexual activity. Drive. Go back to work. Contact a health care provider if: You have a fever. Your pain gets worse or is not helped by medicine. You have frequent nausea or vomiting. You have continued abdominal bloating. You have an ongoing (persistent) cough. You have redness, swelling, or pain in any incision areas. You have fluid, blood, or pus coming from any incisions. Get help right away if: You have trouble breathing. You are unable to swallow. You have persistent vomiting. You have blood in your vomit. You have severe abdominal pain. This information is not intended to replace advice given to you by your health care provider. Make sure you discuss any questions you have with your health care provider. Document Released: 11/13/2003 Document Revised: 08/28/2015 Document Reviewed: 11/21/2013 Elsevier Interactive Patient Education  2017 [employer].   Diet After Nissen Fundoplication Surgery This diet information is for patients who have recently had Nissen fundoplication surgery to correct reflux disease or to repair various types of hernias, such as hiatal hernia and intrathoracic stomach. This diet may also be used for other gastrointestinal surgeries, such as Heller myotomy and repair of achalasia. The diet will help control diarrhea, excess gas and swallowing problems, which may occur after this type of surgery. Keeping Your Stomach from Stretching Eat small, frequent meals (six to eight per day). This will help you consume the majority of the nutrients you need without causing your stomach to feel full or distended.  Drinking large amounts of fluids with meals can stretch your stomach. You may drink fluids between meals as often as you like, but limit fluids to 1/2 cup (4 fluid ounces) with meals and one cup (8 fluid ounces) with snacks.  Sit upright  while eating and stay upright for 30 minutes after each meal. Gravity can help food move through your digestive tract. Do not lie down after eating. Sit upright for 2 hours after your last meal or snack of the day.  Eat very slowly. Take your time when eating.  Take small bites and chew your food well to help aid in swallowing and digestion.  Avoid crusty breads and sticky, gummy foods, such as bananas, fresh doughy breads, rolls and doughnuts. These types of foods become sticky and difficult to swallow.  Toasted breads tend to be better tolerated.  Lastly, if you eat sweets, consume them at the end of your meal to avoid a group of symptoms referred to as "dumping syndrome". This describes the rapid emptying of foods from the stomach to the small intestine. Sweetened beverages, candy and desserts move more rapidly and dump quickly into the intestines. This can cause symptoms of nausea, weakness, cold sweats, cramps, diarrhea and dizzy spells.  Avoiding Gas Avoid drinking through a straw. Do not chew gum or tobacco. These actions cause you to swallow air, which produces excess gas in your stomach. Chew with your mouth closed.  Avoid any foods that cause stomach gas and distention. These foods include corn, dried beans, peas, lentils, onions, broccoli, cauliflower and any food from the cabbage family.  Avoid carbonated drinks, alcohol, citrus and tomato products.  When will I be able to eat a soft diet? After Nissen fundoplication surgery, your diet will be advanced slowly by your surgeon. Generally, you will be on a clear liquid diet for the first few meals. Then you will advance to the full liquid diet for a meal or two and eventually to a Nissen soft diet. Please be aware that each patient's tolerance to food is different. Your doctor will advance your diet depending on how well you progress after surgery. Clear Liquid Diet  The first diet after surgery is the clear liquid diet. It includes the  following liquids: Apple juice  Cranberry juice  Grape juice  Chicken broth  Beef broth  Flavored gelatin (Jell-O)  Decaf tea and coffee  Caffeinated beverages are permitted based on tolerance  Popsicles  [place] ice Carbonated drinks (sodas) are not allowed for the first six to eight weeks after surgery. After this time you can try them again in small amounts.  Full Liquid Diet The full liquid diet contains anything on the clear liquid diet, plus: Milk, soy, rice and almond (no chocolate)  Cream of wheat, cream of rice, grits  Strained creamed soups (no tomato or broccoli)  Vanilla and strawberry-flavored ice cream  Sherbet  Blended, custard styled or whipped yogurt (plain or vanilla only)  Vanilla and butterscotch pudding (no chocolate or coconut)  Nutritional drinks including Ensure, Boost, Carnation Instant Breakfast (no chocolate-flavored) Note: Dairy products, such as milk, ice cream and pudding, may cause diarrhea in some people just after surgery. You may need to avoid milk products. If so, substitute them with lactose-free beverages, such as soy, rice, Lactaid or almond milks.  Nissen Soft Diet Food Category Foods to Choose Foods to Avoid  Beverages Milk, such as, whole, 2%, 1%, non-fat, or skim, soy, rice, almond  Caffeinated and decaf tea and coffee  Powdered drink mixes (in moderation)  Non-citrus juices (apple, grape, cranberry or blends of these)  Fruit nectars  Nutritional drinks including Boost, Ensure, Carnation Instant Breakfast Chocolate milk, cocoa or other chocolate-flavored drinks  Carbonated drinks  Alcohol  Citrus juices like orange, grapefruit, lemon and lime  Breads Pancakes, [place] toast and waffles  Crackers (saltine, butter, soda, graham, Goldfish and Cheese Nips)  Toasted bread Untoasted bread, bagels, [place] and hard rolls, English muffins  Crackers with nuts, seeds, fresh or dried fruit, coconut, or highly seasoned, such as garlic or  onion-flavored  Sweet rolls, coffee cake or doughnuts  Cereals Well cooked cereals, such as oatmeal (plain or flavored)  Cold cereal (Cornflakes, Rice Krispies, Cheerios, Special K plain, Rice Chex and puffed rice) Very coarse cereal, such as bran, shredded wheat  Any cereal with fresh or dried fruit, coconut, seeds or nuts  Desserts Eat in moderation and do not eat desserts or sweets by themselves. Plain cakes, cookies and cream-filled pies  Vanilla and butterscotch pudding or custard  Ice cream, ice milk, frozen yogurt and sherbet  Gelatin made from allowed foods  Fruit ices and popsicles Desserts containing chocolate, coconut, nuts, seeds, fresh or dried fruit, peppermint or spearmint  Eggs  Poached, hard boiled or scrambled Fried eggs and highly seasoned eggs (deviled eggs)  Fats Eat in moderation. Butter and margarine  Mayonnaise and vegetable oils  Mildly seasoned cream sauces and gravies  Plain cream cheese  Sour cream Highly seasoned salad dressings, cream sauces and gravies  Bacon, bacon fat, ham fat, lard and salt pork  Fried foods  Nuts  Fruits Fruit juice  Any canned or cooked fruit except those listed in the AVOID column ALL fresh fruits, such as citrus, bananas and pineapple  Canned pineapple  Dried fruits, such as raisins, berries  Fruits with seeds, such as berries, kiwi and figs  Meat, Fish, Poultry, and [employer] may be ground, minced or chopped to ease swallowing and digestion  Tender, well cooked and moist cuts of beef, chicken, [place] and pork  Veal and lamb  Flaky, cooked fish  Canned tuna  Cottage and ricotta cheeses  Mild cheese, such as American, brick, mozzarella and baby Swiss  Creamy peanut butter  Plain custard or blended fruit yogurt  Moist casseroles, such as macaroni & cheese, tuna noodle  Grilled or toasted cheese sandwich Tough meats with a lot of gristle  Fried, highly seasoned, smoked and fatty meat, fish or poultry, such as  frankfurters, luncheon meats, sausage, bacon, spare ribs, beef brisket, sardines, anchovies, duck and goose  Chili and other entrees made with pepper or chili pepper  Shellfish  Strongly flavored cheeses, such as sharp cheese, extra sharp cheddar, cheese containing peppers or other seasonings  Crunchy peanut butter  Any yogurt with nuts, seeds, coconut, strawberries or raspberries  Potatoes and Starches Peeled, mashed or boiled white or sweet potatoes  Oven-baked potatoes without skin  Well cooked white rice, enriched noodles, barley, spaghetti, macaroni and other pastas Fried potatoes, potato skins and potato chips  Hard and soft taco shells  Fried, brown or wild rice  Soups Mildly flavored meat stocks  Cream soups made from allowed foods Highly seasoned soups and tomato based soups, cream soups made with gas producing vegetables, such as broccoli, cauliflower, onion, etc.  Sweets and Snacks Use in moderation and do not eat large amounts of sweets by themselves. Syrup, honey, jelly and seedless jam  Plain hard candies and plain candies made with allowed ingredients  Molasses  Marshmallows  Other candy made from allowed ingredients  Thin pretzels Jam, marmalade and preserves  Chocolate in any form  Any candy containing nuts, coconut, seeds, peppermint, spearmint or dried or fresh fruit  Popcorn, potato chips, tortilla chips  Soft or hard thick pretzels, such as sourdough  Vegetables Well cooked soft vegetables without seeds or skins, such as asparagus tips, beets, carrots, green and wax beans, chopped spinach, tender canned baby peas, squash and pumpkin Raw vegetables, tomatoes, tomato juice, tomato sauce and V-8 juice  Gas producing vegetables, such as broccoli, Brussel sprouts, cabbage, cauliflower, onions, corn, cucumber, green peppers, rutabagas, turnips, radishes and sauerkraut  Dried beans, peas and lentils  Miscellaneous Salt and spices in moderation  Mustard and vinegar in  moderation Fried or highly seasoned foods  Coconut and seeds  Pickles and olives  Chili sauces, ketchup, barbecue sauce, horseradish, black pepper, chili powder and onion and garlic seasonings  Any other strongly flavored seasoning, condiment, spice or herb not tolerated  Any food not tolerated

## 2022-02-03 NOTE — Progress Notes (Signed)
Outpatient Surgical Follow Up  02/03/2022  Taleia Sadowski is an 81 y.o. female.   Chief Complaint  Patient presents with   Follow-up    Discuss surgery    HPI: Lovenia is s/p repair of paraesophageal hernia robotically 8 months ago.  Uneventful.  Comes with persistent bloating and eructation, as well as chest pdiscomfort.  No fevers no chills.   Complains some retrosternal pain.  She is swallowing and she is eating .  Burping seems to alleviate symptoms.  No fevers no chills. SHe did have a CT scan that I personally reviewed and also shown the images to the patient.  There is evidence of a sliding recurrence this is radiographically.  When compared to the prior one before she had an upside stomach with a large type III paraesophageal hernia.  The wrap seems to be intact.  There is no evidence of perforation or other complications. Wrap seems to be migrating to the mediastinum  Main issue is fullness and burping.  Past Medical History:  Diagnosis Date   Anemia    Cancer ([place])    basal cell   COPD (chronic obstructive pulmonary disease) (HCC)    Dyspnea    GERD (gastroesophageal reflux disease)    Hyperlipidemia    Hypertension    Hypothyroidism     Past Surgical History:  Procedure Laterality Date   COLONOSCOPY N/A 06/23/2016   Procedure: COLONOSCOPY;  Surgeon: Manya Silvas, MD;  Location: [facility];  Service: Endoscopy;  Laterality: N/A;   FEMUR FRACTURE SURGERY Right 02/07/2019   rod placed   HARDWARE REMOVAL Right 11/06/2020   Procedure: EXCHANGE OF LAG SCREW, RIGHT HIP;  Surgeon: Corky Mull, MD;  Location: [facility];  Service: Orthopedics;  Laterality: Right;   INSERTION OF MESH  04/07/2021   Procedure: INSERTION OF MESH;  Surgeon: Jules Husbands, MD;  Location: [facility];  Service: General;;   INTRAMEDULLARY (IM) NAIL INTERTROCHANTERIC Right 12/26/2018   Procedure: INTRAMEDULLARY (IM) NAIL INTERTROCHANTRIC;  Surgeon: Corky Mull, MD;  Location: [facility];  Service:  Orthopedics;  Laterality: Right;   LAPAROSCOPIC NISSEN FUNDOPLICATION  05/07/2977   Procedure: LAPAROSCOPIC NISSEN FUNDOPLICATION;  Surgeon: Jules Husbands, MD;  Location: [facility];  Service: General;;   TUBAL LIGATION  1970   XI ROBOTIC ASSISTED PARAESOPHAGEAL HERNIA REPAIR N/A 04/07/2021   Procedure: XI ROBOTIC ASSISTED PARAESOPHAGEAL HERNIA REPAIR with RNFA to assist;  Surgeon: Jules Husbands, MD;  Location: [facility];  Service: General;  Laterality: N/A;    Family History  Problem Relation Age of Onset   Breast cancer Neg Hx     Social History:  reports that she quit smoking about 15 years ago. Her smoking use included cigarettes. She has never used smokeless tobacco. She reports current alcohol use of about 1.0 standard drink of alcohol per week. She reports that she does not use drugs.  Allergies:  Allergies  Allergen Reactions   Azithromycin Diarrhea   Levofloxacin Diarrhea    Pt prefers not to take because gi side effects   Sulfa Antibiotics Rash    Medications reviewed.    ROS Full ROS performed and is otherwise negative other than what is stated in HPI   BP 127/77   Pulse (!) 47   Temp 98.9 F (37.2 C) (Oral)   Ht 5' (1.524 m)   Wt 117 lb (53.1 kg)   SpO2 99%   BMI 22.85 kg/m   Physical Exam Vitals and nursing note reviewed. Exam  conducted with a chaperone present.  Constitutional:      Appearance: Normal appearance. She is normal weight.  Cardiovascular:     Rate and Rhythm: Normal rate and regular rhythm.     Heart sounds: No murmur heard.    No friction rub.  Pulmonary:     Effort: No respiratory distress.     Breath sounds: Normal breath sounds. No stridor. No wheezing or rhonchi.  Abdominal:     General: Abdomen is flat. There is no distension.     Palpations: Abdomen is soft. There is no mass.     Tenderness: There is no abdominal tenderness.     Hernia: No hernia is present.  Skin:    Capillary Refill: Capillary refill takes less than 2 seconds.   Neurological:     General: No focal deficit present.     Mental Status: She is alert and oriented to person, place, and time.  Psychiatric:        Mood and Affect: Mood normal.        Behavior: Behavior normal.        Thought Content: Thought content normal.        Judgment: Judgment normal.        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  She does have some degree of frailty and malnutrition.  Encouraged her to improve nutritional status with increased protein intake before going back for redo repair.  For now we will and give her some Zofran and try some diet modification.  We will tentatively do redo surgery in January.   Please note that I spent greater than 30 minutes in this encounter including personally reviewing imaging studies, coordinating her care, placing orders , counseling the patient and performing appropriate documentation  [name], MD [facility]

## 2022-02-05 ENCOUNTER — Ambulatory Visit (LOCAL_COMMUNITY_HEALTH_CENTER): Payer: Medicare HMO

## 2022-02-05 DIAGNOSIS — Z23 Encounter for immunization: Secondary | ICD-10-CM | POA: Diagnosis not present

## 2022-02-05 DIAGNOSIS — Z719 Counseling, unspecified: Secondary | ICD-10-CM

## 2022-02-05 NOTE — Progress Notes (Signed)
  Are you feeling sick today? No   Have you ever received a dose of COVID-19 Vaccine? [employer], [place], [place], [place], Other) Yes  If yes, which vaccine and how many doses?   [place], 4   Did you bring the vaccination record card or other documentation?  Yes   Do you have a health condition or are undergoing treatment that makes you moderately or severely immunocompromised? This would include, but not be limited to: cancer, HIV, organ transplant, immunosuppressive therapy/high-dose corticosteroids, or moderate/severe primary immunodeficiency.  No  Have you received COVID-19 vaccine before or during hematopoietic cell transplant (HCT) or CAR-T-cell therapies? No  Have you ever had an allergic reaction to: (This would include a severe allergic reaction or a reaction that caused hives, swelling, or respiratory distress, including wheezing.) A component of a COVID-19 vaccine or a previous dose of COVID-19 vaccine? No   Have you ever had an allergic reaction to another vaccine (other thanCOVID-19 vaccine) or an injectable medication? (This would include a severe allergic reaction or a reaction that caused hives, swelling, or respiratory distress, including wheezing.)   No    Do you have a history of any of the following:  Myocarditis or Pericarditis No  Dermal fillers:  No  Multisystem Inflammatory Syndrome (MIS-C or MIS-A)? No  COVID-19 disease within the past 3 months? No  Vaccinated with monkeypox vaccine in the last 4 weeks? No  Eligible, administered Moderna SpikeVax, monitored and tolerated well. M.Caydin Yeatts, LPN

## 2022-03-08 ENCOUNTER — Telehealth: Payer: Self-pay | Admitting: *Deleted

## 2022-03-08 ENCOUNTER — Other Ambulatory Visit: Payer: Self-pay

## 2022-03-08 MED ORDER — ONDANSETRON HCL 4 MG PO TABS: 4.0000 mg | ORAL_TABLET | Freq: Three times a day (TID) | ORAL | 0 refills | Status: DC | PRN

## 2022-03-08 NOTE — Telephone Encounter (Signed)
Patient called and wanted to see if she can get a refill on Zofran, she uses [facility] in [place]. Please call and advise

## 2022-03-08 NOTE — Telephone Encounter (Signed)
Patient notified that refill of Zofran was sent into her pharmacy.

## 2022-03-11 ENCOUNTER — Other Ambulatory Visit: Payer: Self-pay

## 2022-03-11 MED ORDER — ONDANSETRON 4 MG PO TBDP: 4.0000 mg | ORAL_TABLET | Freq: Three times a day (TID) | ORAL | 0 refills | Status: DC | PRN

## 2022-03-15 ENCOUNTER — Ambulatory Visit: Payer: Medicare HMO | Admitting: Surgery

## 2022-03-15 ENCOUNTER — Ambulatory Visit (INDEPENDENT_AMBULATORY_CARE_PROVIDER_SITE_OTHER): Payer: Medicare HMO | Admitting: Surgery

## 2022-03-15 VITALS — BP 148/83 | HR 46 | Temp 98.0°F | Ht 60.0 in | Wt 117.4 lb

## 2022-03-15 DIAGNOSIS — K449 Diaphragmatic hernia without obstruction or gangrene: Secondary | ICD-10-CM

## 2022-03-15 NOTE — Patient Instructions (Addendum)
Our surgery scheduler Pamala Hurry will call you within 24-48 hours to get you scheduled. If you have not heard from her after 48 hours, please call our office. Have the blue sheet available when she calls to write down important information.   Please check your pulse daily.    If you have any concerns or questions, please feel free to call our office.   Laparoscopic Nissen Fundoplication  Laparoscopic Nissen fundoplication is a surgery to relieve heartburn and other problems caused by fluid from your stomach (gastric fluids) flowing up into your esophagus. The esophagus is the part of the body that moves food from the mouth to the stomach. Normally, the muscle that sits between the stomach and the esophagus (lower esophageal sphincter, LES) keeps stomach fluids in the stomach. In some people, the LES does not work properly, and stomach fluids flow up into the esophagus (reflux). This can happen when part of the stomach bulges through the LES (hiatal hernia). The backward flow of stomach fluids can cause a type of severe and long-lasting heartburn that is called gastroesophageal reflux disease (GERD). You may need this surgery if other treatments for GERD have not helped. In this procedure, the upper part of your stomach is wrapped around the lower end of your esophagus and stitched together (sutured). This tightens the connection between your esophagus and stomach to prevent stomach acid reflux. Tell a health care provider about: Any allergies you have. All medicines you are taking, including vitamins, herbs, eye drops, creams, and over-the-counter medicines. Any problems you or family members have had with anesthetic medicines. Any blood disorders you have. Any surgeries you have had. Any medical conditions you have. Whether you are pregnant or may be pregnant. What are the risks? Generally, this is a safe procedure. However, problems may occur, including: Infection. Bleeding. Damage to other  structures or organs. This can include damage to the lung, causing a collapsed lung. Trouble swallowing (dysphagia). Blood clots. Allergic reactions to medicines. What happens before the procedure? Staying hydrated Follow instructions from your health care provider about hydration, which may include: Up to two hours before the procedure - you may continue to drink clear liquids, such as water, clear fruit juice, black coffee and plain tea. Eating and drinking restrictions Follow instructions from your health care provider about eating and drinking, which may include: 8 hours before the procedure - stop eating heavy meals or foods, such as meat, fried foods, or fatty foods. 6 hours before the procedure - stop eating light meals or foods, such as toast or cereal. 6 hours before the procedure - stop drinking milk or drinks that contain milk. 2 hours before the procedure - stop drinking clear liquids. Medicines Ask your health care provider about: Changing or stopping your regular medicines. This is especially important if you are taking diabetes medicines or blood thinners. Taking medicines such as aspirin and ibuprofen. These medicines can thin your blood. Do not take these medicines unless your health care provider tells you to take them. Taking over-the-counter medicines, vitamins, herbs, and supplements. Tests Your health care provider will do tests to plan the procedure. This may include: An exam using a flexible scope passed down your esophagus into your stomach (endoscopy). Imaging studies. General instructions Plan to have a responsible adult take you home from the hospital or clinic. Ask your health care provider: How your surgery site will be marked. What steps will be taken to help prevent infection. These steps may include: Removing hair at the  surgery site. Washing skin with a germ-killing soap. Taking antibiotic medicine. Do not use any products that contain nicotine or  tobacco for at least 4 weeks before the procedure. These products include cigarettes, chewing tobacco, and vaping devices, such as e-cigarettes. If you need help quitting, ask your health care provider. What happens during the procedure? An IV will be inserted into one of your veins. You will be given medicine in your IV to help you relax (sedative) just before the procedure and a medicine to make you fall asleep (general anesthetic). You may have a tube placed through your nose into your stomach to drain stomach acid during the procedure (nasogastric tube). The surgeon will make a small incision in your abdomen and insert a tube through the incision. Your abdomen will be filled with a gas. This helps the surgeon see your organs better, and it makes more space to work. The surgeon will insert a thin, lighted tube (laparoscope) through the small incision. This allows your surgeon to see into your abdomen. The surgeon will make several other small incisions in your abdomen to insert the other instruments that are needed during the procedure. Another instrument (dilator) will be passed through your mouth and down your esophagus into the upper part of your stomach. The dilator will prevent your LES from being closed too tightly during surgery. The upper part of your stomach will be wrapped around the lower part of your esophagus and will be stitched into place. This will strengthen the lower esophageal sphincter and prevent reflux. If you have a hiatal hernia, it will be repaired. The gas will be released from your abdomen. All instruments will be removed, and the incisions will be closed with stitches (sutures). A bandage (dressing) will be placed on your skin over the incisions. The procedure may vary among health care providers and hospitals. What happens after the procedure? Your blood pressure, heart rate, breathing rate, and blood oxygen level will be monitored until you leave the hospital or  clinic. You will be given pain medicine as needed. Your IV will be kept in until you are able to drink fluids. You will be encouraged to get up and walk around as soon as possible. Summary Laparoscopic Nissen fundoplication is a surgery to relieve heartburn and other problems caused by gastric fluids flowing up into your esophagus. You may need this surgery if other treatments for GERD have not helped. Follow instructions from your health care provider about eating and drinking before the procedure. Your surgeon will use a thin, lighted tube (laparoscope) that is inserted through a small incision, allowing the surgeon to see into your abdomen. This information is not intended to replace advice given to you by your health care provider. Make sure you discuss any questions you have with your health care provider. Document Revised: 10/07/2019 Document Reviewed: 10/07/2019 Elsevier Patient Education  [place].

## 2022-03-16 ENCOUNTER — Telehealth: Payer: Self-pay | Admitting: Surgery

## 2022-03-16 NOTE — Telephone Encounter (Signed)
Patient has been advised of Pre-Admission date/time, and Surgery date at [facility].  Surgery Date: 04/15/22 Preadmission Testing Date: 04/07/22 (phone 8a-1p)  Patient has been made aware to call [phone], between 1-3:00pm the day before surgery, to find out what time to arrive for surgery.

## 2022-03-19 ENCOUNTER — Encounter: Payer: Self-pay | Admitting: Surgery

## 2022-03-19 NOTE — Progress Notes (Signed)
Outpatient Surgical Follow Up  03/19/2022  Kelly Wood is an 81 y.o. female.   Chief Complaint  Patient presents with   Follow-up    3 month f/u hiatal hernia    HPI: Kelly Wood is s/p repair of paraesophageal hernia robotically 10 months ago.  Uneventful.  Comes with persistent bloating and eructation, as well as chest pdiscomfort.  No fevers no chills.  She is swallowing and she is eating .  Burping seems to alleviate symptoms.  No fevers no chills. SHe did have a CT scan that I personally reviewed.  There is evidence of a sliding recurrence this is radiographically.  When compared to the prior one before she had an upside stomach with a large type III paraesophageal hernia.  The wrap seems to be intact.  There is no evidence of perforation or other complications. Wrap seems to be migrating to the mediastinum  Main issue is fullness and burping. SHe still having intermittent sxs. Pulmonary sxs have improved significantly  Past Medical History:  Diagnosis Date   Anemia    Cancer ([place])    basal cell   COPD (chronic obstructive pulmonary disease) (HCC)    Dyspnea    GERD (gastroesophageal reflux disease)    Hyperlipidemia    Hypertension    Hypothyroidism     Past Surgical History:  Procedure Laterality Date   COLONOSCOPY N/A 06/23/2016   Procedure: COLONOSCOPY;  Surgeon: Manya Silvas, MD;  Location: [place];  Service: Endoscopy;  Laterality: N/A;   FEMUR FRACTURE SURGERY Right 02/07/2019   rod placed   HARDWARE REMOVAL Right 11/06/2020   Procedure: EXCHANGE OF LAG SCREW, RIGHT HIP;  Surgeon: Corky Mull, MD;  Location: [facility];  Service: Orthopedics;  Laterality: Right;   INSERTION OF MESH  04/07/2021   Procedure: INSERTION OF MESH;  Surgeon: Jules Husbands, MD;  Location: [facility];  Service: General;;   INTRAMEDULLARY (IM) NAIL INTERTROCHANTERIC Right 12/26/2018   Procedure: INTRAMEDULLARY (IM) NAIL INTERTROCHANTRIC;  Surgeon: Corky Mull, MD;  Location: [facility];   Service: Orthopedics;  Laterality: Right;   LAPAROSCOPIC NISSEN FUNDOPLICATION  09/09/8936   Procedure: LAPAROSCOPIC NISSEN FUNDOPLICATION;  Surgeon: Jules Husbands, MD;  Location: [facility];  Service: General;;   TUBAL LIGATION  1970   XI ROBOTIC ASSISTED PARAESOPHAGEAL HERNIA REPAIR N/A 04/07/2021   Procedure: XI ROBOTIC ASSISTED PARAESOPHAGEAL HERNIA REPAIR with RNFA to assist;  Surgeon: Jules Husbands, MD;  Location: [facility];  Service: General;  Laterality: N/A;    Family History  Problem Relation Age of Onset   Breast cancer Neg Hx     Social History:  reports that she quit smoking about 15 years ago. Her smoking use included cigarettes. She has never used smokeless tobacco. She reports current alcohol use of about 1.0 standard drink of alcohol per week. She reports that she does not use drugs.  Allergies:  Allergies  Allergen Reactions   Azithromycin Diarrhea   Levofloxacin Diarrhea    Pt prefers not to take because gi side effects   Sulfa Antibiotics Rash    Medications reviewed.    ROS Full ROS performed and is otherwise negative other than what is stated in HPI   BP (!) 148/83   Pulse (!) 46   Temp 98 F (36.7 C) (Oral)   Ht 5' (1.524 m)   Wt 117 lb 6.4 oz (53.3 kg)   SpO2 96%   BMI 22.93 kg/m   Physical Exam Vitals and nursing note  reviewed. Exam conducted with a chaperone present.  Constitutional:      General: She is not in acute distress.    Appearance: Normal appearance. She is not ill-appearing.  Cardiovascular:     Rate and Rhythm: Normal rate and regular rhythm.  Pulmonary:     Effort: Pulmonary effort is normal. No respiratory distress.     Breath sounds: Normal breath sounds. No stridor.  Abdominal:     General: Abdomen is flat. There is no distension.     Palpations: Abdomen is soft. There is no mass.     Tenderness: There is no abdominal tenderness.     Hernia: No hernia is present.  Musculoskeletal:        General: No swelling or  tenderness. Normal range of motion.     Cervical back: Normal range of motion and neck supple. No rigidity or tenderness.  Skin:    General: Skin is warm and dry.     Capillary Refill: Capillary refill takes less than 2 seconds.     Coloration: Skin is not jaundiced or pale.  Neurological:     General: No focal deficit present.     Mental Status: She is alert and oriented to person, place, and time.  Psychiatric:        Mood and Affect: Mood normal.        Behavior: Behavior normal.        Thought Content: Thought content normal.        Judgment: Judgment normal.       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  She does have some degree of frailty and malnutrition.   Once again I encouraged her to have good protein intake.  Husband is also aware that patient had some compliance issues and does not necessarily follow diet recommendations.  I also discussed with her about the importance of daily blood pressure measurements and once again patient was hesitant even about as a simple measure of taking her blood pressure at daily so we could monitor the hemodynamic trend.  I did spend significant time counseling her regarding those issues and seizure she seems to understand  We will tentatively do redo surgery in January.  I was extremely candid with her regarding alcohol and expectations.  She is 63 she does not have the healthiest tissues and she is frail.  I will do my best to reinforce strict grew and also likely do a gastropexy to prevent migration of the stomach within the mediastinum.  We also discussed with her the risk the benefit and the potential complications.  She again wishes to wait after January to get the surgery done. Please note that I spent 40 minutes in this encounter including personally reviewing imaging studies, coordinating her care, placing orders and performing appropriate documentation.  [name], MD [facility]  Surgeon

## 2022-04-07 ENCOUNTER — Encounter
Admission: RE | Admit: 2022-04-07 | Discharge: 2022-04-07 | Disposition: A | Discharge status: Home / Self Care | Payer: Medicare HMO | Source: Ambulatory Visit | Attending: Surgery | Admitting: Surgery

## 2022-04-07 VITALS — Ht 60.0 in | Wt 118.0 lb

## 2022-04-07 DIAGNOSIS — Z01812 Encounter for preprocedural laboratory examination: Secondary | ICD-10-CM

## 2022-04-07 DIAGNOSIS — I10 Essential (primary) hypertension: Secondary | ICD-10-CM | POA: Diagnosis not present

## 2022-04-07 DIAGNOSIS — R001 Bradycardia, unspecified: Secondary | ICD-10-CM | POA: Diagnosis not present

## 2022-04-07 DIAGNOSIS — Z01818 Encounter for other preprocedural examination: Secondary | ICD-10-CM | POA: Insufficient documentation

## 2022-04-07 HISTORY — DX: Personal history of other diseases of the digestive system: Z87.19

## 2022-04-07 HISTORY — DX: Emphysema, unspecified: J43.9

## 2022-04-07 HISTORY — DX: Zoster without complications: B02.9

## 2022-04-07 HISTORY — DX: Cortical age-related cataract, unspecified eye: H25.019

## 2022-04-07 HISTORY — DX: Age-related osteoporosis without current pathological fracture: M81.0

## 2022-04-07 HISTORY — DX: Atherosclerosis of aorta: I70.0

## 2022-04-07 HISTORY — DX: Deficiency of other specified B group vitamins: E53.8

## 2022-04-07 HISTORY — DX: Unilateral primary osteoarthritis, left hip: M16.12

## 2022-04-07 NOTE — Patient Instructions (Addendum)
Your procedure is scheduled on: Thursday, January 11 Report to the Registration Desk on the 1st floor of the [facility]. To find out your arrival time, please call [phone] between 1PM - 3PM on: Wednesday, January 10 If your arrival time is 6:00 am, do not arrive prior to that time as the [facility] entrance doors do not open until 6:00 am.  REMEMBER: Instructions that are not followed completely may result in serious medical risk, up to and including death; or upon the discretion of your surgeon and anesthesiologist your surgery may need to be rescheduled.  Do not eat or drink after midnight the night before surgery.  No gum chewing, lozengers or hard candies.  TAKE THESE MEDICATIONS THE MORNING OF SURGERY WITH A SIP OF WATER:  Levothyroxine Pantoprazole (Protonix) - (take one the night before and one on the morning of surgery - helps to prevent nausea after surgery.) Pravastatin  Use ventolin inhaler on the day of surgery and bring to the hospital.  One week prior to surgery: starting January 4 Stop aspirin and Anti-inflammatories (NSAIDS) such as Advil, Aleve, Ibuprofen, Motrin, Naproxen, Naprosyn and Aspirin based products such as Excedrin, Goodys Powder, BC Powder. Stop ANY OVER THE COUNTER supplements until after surgery. Stop Turmeric, probiotic, multiple vitamins, melatonin You may however, continue to take Tylenol if needed for pain up until the day of surgery.  No Alcohol for 24 hours before or after surgery.  No Smoking including e-cigarettes for 24 hours prior to surgery.  No chewable tobacco products for at least 6 hours prior to surgery.  No nicotine patches on the day of surgery.  Do not use any "recreational" drugs for at least a week prior to your surgery.  Please be advised that the combination of cocaine and anesthesia may have negative outcomes, up to and including death. If you test positive for cocaine, your surgery will be cancelled.  On the morning  of surgery brush your teeth with toothpaste and water, you may rinse your mouth with mouthwash if you wish. Do not swallow any toothpaste or mouthwash.  Use CHG Soap as directed on instruction sheet.  Do not wear jewelry, make-up, hairpins, clips or nail polish.  Do not wear lotions, powders, or perfumes.   Do not shave body from the neck down 48 hours prior to surgery just in case you cut yourself which could leave a site for infection.  Also, freshly shaved skin may become irritated if using the CHG soap.  Contact lenses, hearing aids and dentures may not be worn into surgery.  Do not bring valuables to the hospital. [facility] is not responsible for any missing/lost belongings or valuables.   Notify your doctor if there is any change in your medical condition (cold, fever, infection).  Wear comfortable clothing (specific to your surgery type) to the hospital.  After surgery, you can help prevent lung complications by doing breathing exercises.  Take deep breaths and cough every 1-2 hours. Your doctor may order a device called an Incentive Spirometer to help you take deep breaths.  If you are being admitted to the hospital overnight, leave your suitcase in the car. After surgery it may be brought to your room.  If you are being discharged the day of surgery, you will not be allowed to drive home. You will need a responsible adult (18 years or older) to drive you home and stay with you that night.   If you are taking public transportation, you will need  to have a responsible adult (18 years or older) with you. Please confirm with your physician that it is acceptable to use public transportation.   Please call the [place] [facility] Dept. at [phone] if you have any questions about these instructions.  Surgery Visitation Policy:  Patients undergoing a surgery or procedure may have two family members or support persons with them as long as the person is not COVID-19  positive or experiencing its symptoms.   Inpatient Visitation:    Visiting hours are 7 a.m. to 8 p.m. Up to four visitors are allowed at one time in a patient room. The visitors may rotate out with other people during the day. One designated support person (adult) may remain overnight.  Due to an increase in RSV and influenza rates and associated hospitalizations, children ages 10 and under will not be able to visit patients in [facility]. Masks continue to be strongly recommended.     Preparing for Surgery with CHLORHEXIDINE GLUCONATE (CHG) Soap  Chlorhexidine Gluconate (CHG) Soap  o An antiseptic cleaner that kills germs and bonds with the skin to continue killing germs even after washing  o Used for showering the night before surgery and morning of surgery  Before surgery, you can play an important role by reducing the number of germs on your skin.  CHG (Chlorhexidine gluconate) soap is an antiseptic cleanser which kills germs and bonds with the skin to continue killing germs even after washing.  Please do not use if you have an allergy to CHG or antibacterial soaps. If your skin becomes reddened/irritated stop using the CHG.  1. Shower the NIGHT BEFORE SURGERY and the MORNING OF SURGERY with CHG soap.  2. If you choose to wash your hair, wash your hair first as usual with your normal shampoo.  3. After shampooing, rinse your hair and body thoroughly to remove the shampoo.  4. Use CHG as you would any other liquid soap. You can apply CHG directly to the skin and wash gently with a scrungie or a clean washcloth.  5. Apply the CHG soap to your body only from the neck down. Do not use on open wounds or open sores. Avoid contact with your eyes, ears, mouth, and genitals (private parts). Wash face and genitals (private parts) with your normal soap.  6. Wash thoroughly, paying special attention to the area where your surgery will be performed.  7. Thoroughly rinse your  body with warm water.  8. Do not shower/wash with your normal soap after using and rinsing off the CHG soap.  9. Pat yourself dry with a clean towel.  10. Wear clean pajamas to bed the night before surgery.  12. Place clean sheets on your bed the night of your first shower and do not sleep with pets.  13. Shower again with the CHG soap on the day of surgery prior to arriving at the hospital.  14. Do not apply any deodorants/lotions/powders.  15. Please wear clean clothes to the hospital.

## 2022-04-09 NOTE — Progress Notes (Signed)
Progress Notes - documented in this encounter Ottie Glazier, MD - 03/10/2022 1:15 PM EST Formatting of this note is different from the original. Images from the original note were not included.     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 Kelly Wood is 82 y.o. female who was referred to [facility]monary clinic due to abnormal CT chest. She has smoking history includes from age 45 - 1/2 pack until 33 for total of 20 pack years. Patient shares she is from [place] and in the 1980s had chest tube placed due collapsed lung, thought to be due to altitude induced negative pressure related effusion. She had second similar episode a few years later. The second episode happened without flying or anything. She does not cough up phlegm. She has sister with COPD. She has mother with emphysema who smoked. No liver disease in family or cancer.  She admits to belching and "burping all day". She has reflux despite PPi. She denies painful GERD episodes.  12/02/2021-patient is status post surgical correction of paraesophageal hernia with recurrence and slipped Nissen. Status post evaluation in office by general surgery with additional studies including evaluation in process. She is not hypoxemic and is in no distress has had less cough. Breathing has been "a whole lot better". Coughing is minimal and snorring has improved.  03/10/22- patient is here for evaluation of chronic cough, sp repair of paraesophageal hernia. She had episode of cold last month and is slowly recovering but overall feels better. She has plan to repair slipped nissen in Jan 2024. She is overall doing well. She has not had swelling of LE and has not required lasix. She continues to take protonix.  SPIROMETRIC DATA: - 08/26/20- FEV1- 1.19L- 68% - TLC - 103%- DLCO 47% -11/9.22- FEV1- 0.98L - 56% -06/04/21- FEV1 -  1.30L 76%  Past medical History: She has a past medical history of Cataract cortical, senile, GERD (gastroesophageal reflux disease), Hyperlipidemia, Shingles, and Thyroid disease.  is allergic to azithromycin, levaquin [levofloxacin], and sulfa (sulfonamide antibiotics).  ROS: 10 point review of systems has been conducted during interview and evaluation today. Remainder has been reviewed and is negative except as per HPI  Current Medications (including changes made at this visit) Current Outpatient Medications Medication Sig Dispense Refill alendronate (FOSAMAX) 70 MG tablet TAKE 1 TABLET BY MOUTH EVERY 7 DAYS. TAKE FIRST THING IN THE MORNING WITH FULL GLASS OF WATER, DO NOT EAT OR DRINK ANYTHING ELSE FOR 30MIN 12 tablet 3 aspirin 81 MG EC tablet Take 81 mg by mouth once daily. benzonatate (TESSALON) 200 MG capsule Take 1 capsule (200 mg total) by mouth 3 (three) times daily as needed for Cough 30 capsule 0 BIOTIN ORAL Take by mouth bisoproloL-hydroCHLOROthiazide (ZIAC) 10-6.25 mg tablet TAKE 1 TABLET ONCE DAILY **TEVA** 90 tablet 3 bisoproloL-hydroCHLOROthiazide (ZIAC) 5-6.25 mg tablet Take 1 tablet by mouth once daily 90 tablet 0 diltiazem (CARDIZEM CD) 240 MG CD capsule TAKE 1 CAPSULE DAILY 90 capsule 3 fluocinonide (LIDEX) 0.05 % gel FUROsemide (LASIX) 40 MG tablet Take 1 tablet (40 mg total) by mouth once daily as needed for Edema 30 tablet 2 ibuprofen (MOTRIN) 200 MG tablet Take 200 mg by mouth every 6 (six) hours as needed for Pain Lactobacillus acidophilus (PROBIOTIC ORAL) Take by mouth levothyroxine (SYNTHROID) 75 MCG tablet TAKE 1 TABLET ONCE DAILY ON AN EMTPY STOMACH WITH A GLASS  OF WATER AT LEAST 30 TO 60 MINUTES BEFRE BREAKFAST 90 tablet 3 MELATONIN ORAL Take by mouth ondansetron (ZOFRAN) 4 MG tablet Take 4 mg by mouth every 8 (eight) hours as needed POTASSIUM ORAL Take by mouth pravastatin (PRAVACHOL) 80 MG tablet TAKE 1 TABLET DAILY 90 tablet 3 TURMERIC ORAL Take by  mouth VENTOLIN HFA 90 mcg/actuation inhaler INHALE 2 INHALATIONS INTO THE LUNGS EVERY 6 (SIX) HOURS AS NEEDED FOR WHEEZING OR SHORTNESS OF BREATH 18 each 5  Current Facility-Administered Medications Medication Dose Route Frequency Provider Last Rate Last Admin cyanocobalamin (VITAMIN B12) injection 1,000 mcg 1,000 mcg Intramuscular Q30 Days Idelle Crouch, MD 1,000 mcg at 02/04/22 1504  Social History: She reports that she quit smoking about 17 years ago. Her smoking use included cigarettes. She has a 43.00 pack-year smoking history. She has never used smokeless tobacco. She reports current alcohol use. Drug use questions deferred to the physician.  Surgical History: has a past surgical history that includes Pneumothoraces x 2; Tubal ligation (Bilateral); Cataract extraction (Right); Colonoscopy (03/31/2006); Colonoscopy (06/23/2016); Reduction and internal fixation of Right hip fracture with biomet Affixis TFn nail (Right, 12/26/2018); Lag screw exchange, right hip (Right, 11/06/2020); egd (03/03/2021); and Epigastric hernia repair (N/A).  Family History: Her family history includes Colon polyps in her mother; Hyperlipidemia (Elevated cholesterol) in her brother; Myocardial Infarction (Heart attack) in her father.  Physical Exam: There were no vitals taken for this visit. GENERAL: NAD, able to speak in complete sentences without dyspnea or cough HEENT: normocephalic, PERRL, EOMI, TM with sharp light reflex bilaterally. Normal external auditory canal. No hypertrophy of nasal turbinates. Clear mucosa in mouth without any exudates or erythema NECK: Supple, no jugular venous distension, nodes, stridor nor thyromegaly. Trachea midline. CARDIOVASCULAR: RRR, no murmurs, gallops, rubs RESPIRATORY: normal respiratory effort, clear to auscultation bilaterally. No crackles, or wheezes GASTROINTESTINAL: NABS, soft, non tender, non distended EXTR: No edema, homans sign or cyanosis LYMPHATIC: No nodes in  neck or supraclavicular areas INTEGUMENTARY: No rashes, bruising, telantagias, sclerodactaly nor alopecia NEURO: Cranial nerves, motor, sensation intact. Normal gait  Labs & Imaging:  CLINICAL DATA: Shortness of breath for 2 weeks showed no history of cancer. History of spontaneous pneumothorax is in this 82 year old female  EXAM: CT CHEST WITHOUT CONTRAST  TECHNIQUE: Multidetector CT imaging of the chest was performed following the standard protocol without IV contrast.  COMPARISON: March 27, 2004.  FINDINGS: Cardiovascular: Calcified atheromatous plaque of the thoracic aorta. Mildly tortuous thoracic aorta. Aorta is 3.2 cm maximal caliber. Central pulmonary vasculature normal caliber. Three-vessel coronary artery calcification. Heart size is normal without pericardial effusion.  Mediastinum/Nodes: Thoracic inlet structures are normal.  No axillary lymphadenopathy. No thoracic inlet lymphadenopathy. No mediastinal lymphadenopathy. No gross hilar lymphadenopathy.  Lungs/Pleura: Subpleural reticulation and ground-glass bilaterally in the chest. No overt signs of bronchiectasis. No visible honeycombing. Distribution is relatively more pronounced in the mid and lower chest than at the apices.  Pulmonary emphysema both centrilobular and paraseptal. Airways are patent. No consolidation. No pleural effusion.  Upper Abdomen: Incidental imaging of upper abdominal contents without acute process. The entire stomach is herniated into the chest. No perigastric stranding. Suggested on prior chest x-rays.  Musculoskeletal: No chest wall mass. Osteopenia. Spinal degenerative changes. No acute or destructive bone process.  IMPRESSION: 1. Subpleural ground-glass and mild septal thickening superimposed on pulmonary emphysema which is moderate to marked and worse at the lung apices. Pattern is nonspecific and could even be post infectious or inflammatory, correlate with any prior  history of COVID-19 infection. Pattern does not meet strict criteria for UIP. Consider NS IP. Would suggest follow-up high-resolution chest CT for further evaluation in this patient with shortness of breath to better assess parenchymal changes and evaluate for any areas of air trapping and early bronchiectasis. 2. 4 mm mm RIGHT upper lobe pulmonary nodule (image 30 of series 3) not mentioned above. No follow-up needed if patient is low-risk. Non-contrast chest CT can be considered in 12 months if patient is high-risk. This recommendation follows the consensus statement: Guidelines for Management of Incidental Pulmonary Nodules Detected on CT Images: From the Fleischner Society 2017; Radiology 2017; 284:228-243. 3. 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 4. 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  Aortic Atherosclerosis (ICD10-I70.0) and Emphysema (ICD10-J43.9).  Electronically Signed By: Zetta Bills M.D. On: 05/14/2020 15:32  I have personally reviewed all above lab data and Imaging today and discussed pertinent findings with patient.  MedicalDecision Making:  Impression/Plan:  Combined pulmonary fibrosis and emphysema -Minimal honeycombing unlikely to be idiopathic pulmonary fibrosis -reviewed CT abd and Chest from June 20203 -this may be due to recurrent microaspiration due to regurgitation -s/p surgery with substantial improvement.  Post COVID19 status - patient did not require O2 or paxlovid -she is close to baseliene.  COPD grade 2 D Current inhalers: albuterol PNR Smoking history and Cessation: Social History  Tobacco Use Smoking Status Former Packs/day: 1.00 Years: 43.00 Additional pack years: 0.00 Total pack years: 43.00 Types: Cigarettes Quit date: 2006 Years since quitting: 17.9 Smokeless Tobacco Never Counseling given: Not Answered  PFTs: FEV1-64.2% Significant Imaging and Lab findings:  n Recent respiratory hospitalization n Phenotype: GERD complaints during visit today: n  Depression screening PHQ 2/9 last 3 flowsheet values  02/08/2019 1:20 PM 05/26/2021 3:53 PM PHQ-2/9 Depression Screening Little interest or pleasure in doing things 0 0 Feeling down, depressed, or hopeless (or irritable for Teens only)? 0 0 Total Prescreening Score 0 0 Total Score = 0 0  Depression Severity and Treatment Recommendations: 0-4= None 5-9= Mild / Treatment: Support, educate to call if worse; return in one month 10-14= Moderate / Treatment: Support, watchful waiting; Antidepressant or Psychotherapy 15-19= Moderately severe / Treatment: Antidepressant OR Psychotherapy >= 20 = Major depression, severe / Antidepressant AND Psychotherapy  Severe Hiatal hernia with GERD - patient has seldom cough and dyspnea. -patient can be seen by GI to evaluate if anything further is needed. -Patient is to have evaluation for surgical intervention and this is being scheduled. -reviewed with Dr Dahlia Byes patient is scheduled for surgical intervention. status post robotic paraesophageal hernia repair 04/07/21. -development of slipped Nissen with plan for repair in Jan 2024  Thank you for allowing me to participate in the care of this patient.  Patient relates understanding our goals for this visit and is agreeable to above plans.  Claudette Stapler, MD [facility] Division of Pulmonary & Critical Care Medicine  Electronically signed by Ottie Glazier, MD at 03/10/2022 1:34 PM EST  Plan of Treatment - documented as of this encounter Plan of Treatment - Upcoming Encounters Upcoming Encounters Date Type Department Care Team (Latest Contact Info) Description  04/12/2022 2:30 PM EST Nurse Only [facility]  [place], [place] [address]  [place], MD  [address] [facility] [place], [place] [address]  [phone] (Work)   [phone] JDS  05/11/2022 1:30 PM EST Office Visit [facility]  [place], [place] [address]  [phone]  Ottie Glazier, [place], [place] [address]  [phone] (Work)  [phone] (Fax)  recheck  05/19/2022 11:00 AM EST Office Visit [facility]  [address]  [place], [place] [address]  [phone]  Poggi, Smith Mince, MD  [place]  [facility] [place], [place] [address]  [phone] (Work)  [phone] (Fax)  return in february  09/10/2022 9:15 AM EDT Ancillary Orders [facility]  [address]  [place], [place] [address]  [phone]  Idelle Crouch, MD  [address]  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 Maintain a healthy lifestyle  Medication management/adherance  Lifestyle   On track (10/13/2020 3:08 PM EDT) No Bryson Dames, CMA  Maintain health/healthy lifestyle  Lifestyle   On track (10/13/2020 3:08 PM EDT) No Bryson Dames, CMA  Reduce Stress/Anxiety  Lifestyle   On track (10/13/2020 3:08 PM EDT) No Bryson Dames, CMA   Visit Diagnoses - documented in this encounter Visit Diagnoses Diagnosis  LPRD (laryngopharyngeal reflux disease) - Primary  Acute laryngitis, without mention of obstruction     Care Teams - documented as of this encounter Care Teams Team Member Relationship Specialty Start Date End Date  Idelle Crouch, MD  NPI: [id number]  [address] [place], [place] [address]  [phone] (Work)  [phone] Joylene Igo)  PCP - General Internal Medicine 07/13/13     Patient Demographics  Patient Demographics Patient Address Communication Language Race / Ethnicity Marital Status  Former (Aug 21, 2010 - Jun. 30, 2016): [address] [facility]) [place], [place] [address]  (Jan. 03, 2017 - ): [place] [facility]) [place], [place] [address] [phone] [facility]) [phone] (Home) [email or web address] English (Preferred) White / Not Hispanic or Latino Married   Patient Contacts  Patient Contacts Contact Name Contact Address Communication Relationship to Patient  Anah Billard [place], [place] [address] [phone] (Home) Spouse, Emergency Contact   Document Information  Primary Care Provider Other Service Providers Document Coverage Dates  Idelle Crouch, MD (Apr. 10, 2015April 10, 2015 - Present) NPI: [id number] [phone] (Work) [phone] (Fax) [facility] [place], [place] [address] Internal Medicine [facility] [place], [place] [address]  Dec. 06, 2023December 06, Joy [address] [place], [place] [address]   Encounter Providers Encounter Date  Ottie Glazier, MD (Attending) NPI: [id number] [phone] (Work) [phone] (Fax) [place],  [address] Pulmonary Disease Dec. 06, 2023December 06, 2023   Legal Authenticator   Associate Chief Health Information Officer

## 2022-04-09 NOTE — Pre-Procedure Instructions (Addendum)
Called Dr Doy Hutching office to see if they had received medical clearance that I faxed over to their office on 1-3. Receptionist checked with his nurse who said they did not. I confirmed all 3 fax numbers with office and she said to refax again. Refaxing clearance again.Confirmation received

## 2022-04-09 NOTE — Pre-Procedure Instructions (Signed)
Medical Clearance received from Dr Doy Hutching and placed on chart-Moderate risk

## 2022-04-15 ENCOUNTER — Ambulatory Visit: Payer: Medicare HMO | Admitting: Registered Nurse

## 2022-04-15 ENCOUNTER — Observation Stay: Payer: Medicare HMO

## 2022-04-15 ENCOUNTER — Other Ambulatory Visit: Payer: Self-pay

## 2022-04-15 ENCOUNTER — Encounter: Payer: Self-pay | Admitting: Surgery

## 2022-04-15 ENCOUNTER — Inpatient Hospital Stay
Admission: AD | Admit: 2022-04-15 | Discharge: 2022-04-29 | DRG: 326 | Disposition: A | Discharge status: Other Acute Inpatient Hospital | Payer: Medicare HMO | Attending: Surgery | Admitting: Surgery

## 2022-04-15 ENCOUNTER — Observation Stay: Payer: Medicare HMO | Admitting: General Practice

## 2022-04-15 ENCOUNTER — Encounter
Admission: AD | Disposition: A | Discharge status: Other Acute Inpatient Hospital | Payer: Self-pay | Source: Home / Self Care | Attending: Surgery

## 2022-04-15 DIAGNOSIS — K223 Perforation of esophagus: Secondary | ICD-10-CM

## 2022-04-15 DIAGNOSIS — E871 Hypo-osmolality and hyponatremia: Secondary | ICD-10-CM | POA: Diagnosis not present

## 2022-04-15 DIAGNOSIS — K9189 Other postprocedural complications and disorders of digestive system: Principal | ICD-10-CM | POA: Diagnosis present

## 2022-04-15 DIAGNOSIS — I3481 Nonrheumatic mitral (valve) annulus calcification: Secondary | ICD-10-CM | POA: Diagnosis present

## 2022-04-15 DIAGNOSIS — Z881 Allergy status to other antibiotic agents status: Secondary | ICD-10-CM

## 2022-04-15 DIAGNOSIS — E538 Deficiency of other specified B group vitamins: Secondary | ICD-10-CM | POA: Diagnosis present

## 2022-04-15 DIAGNOSIS — Z87891 Personal history of nicotine dependence: Secondary | ICD-10-CM

## 2022-04-15 DIAGNOSIS — K449 Diaphragmatic hernia without obstruction or gangrene: Secondary | ICD-10-CM

## 2022-04-15 DIAGNOSIS — I272 Pulmonary hypertension, unspecified: Secondary | ICD-10-CM | POA: Diagnosis present

## 2022-04-15 DIAGNOSIS — I471 Supraventricular tachycardia, unspecified: Secondary | ICD-10-CM

## 2022-04-15 DIAGNOSIS — E876 Hypokalemia: Secondary | ICD-10-CM | POA: Diagnosis not present

## 2022-04-15 DIAGNOSIS — R112 Nausea with vomiting, unspecified: Secondary | ICD-10-CM | POA: Diagnosis not present

## 2022-04-15 DIAGNOSIS — I4719 Other supraventricular tachycardia: Secondary | ICD-10-CM

## 2022-04-15 DIAGNOSIS — Z7983 Long term (current) use of bisphosphonates: Secondary | ICD-10-CM

## 2022-04-15 DIAGNOSIS — E46 Unspecified protein-calorie malnutrition: Secondary | ICD-10-CM | POA: Diagnosis present

## 2022-04-15 DIAGNOSIS — J948 Other specified pleural conditions: Secondary | ICD-10-CM | POA: Diagnosis not present

## 2022-04-15 DIAGNOSIS — R54 Age-related physical debility: Secondary | ICD-10-CM | POA: Diagnosis present

## 2022-04-15 DIAGNOSIS — D649 Anemia, unspecified: Secondary | ICD-10-CM | POA: Diagnosis present

## 2022-04-15 DIAGNOSIS — H9190 Unspecified hearing loss, unspecified ear: Secondary | ICD-10-CM | POA: Diagnosis present

## 2022-04-15 DIAGNOSIS — J918 Pleural effusion in other conditions classified elsewhere: Secondary | ICD-10-CM | POA: Diagnosis not present

## 2022-04-15 DIAGNOSIS — Y838 Other surgical procedures as the cause of abnormal reaction of the patient, or of later complication, without mention of misadventure at the time of the procedure: Secondary | ICD-10-CM | POA: Diagnosis present

## 2022-04-15 DIAGNOSIS — M810 Age-related osteoporosis without current pathological fracture: Secondary | ICD-10-CM | POA: Diagnosis present

## 2022-04-15 DIAGNOSIS — E039 Hypothyroidism, unspecified: Secondary | ICD-10-CM | POA: Diagnosis present

## 2022-04-15 DIAGNOSIS — R410 Disorientation, unspecified: Secondary | ICD-10-CM | POA: Diagnosis not present

## 2022-04-15 DIAGNOSIS — I700 Atherosclerosis of aorta: Secondary | ICD-10-CM | POA: Diagnosis present

## 2022-04-15 DIAGNOSIS — Z7982 Long term (current) use of aspirin: Secondary | ICD-10-CM

## 2022-04-15 DIAGNOSIS — E8809 Other disorders of plasma-protein metabolism, not elsewhere classified: Secondary | ICD-10-CM | POA: Diagnosis present

## 2022-04-15 DIAGNOSIS — J95811 Postprocedural pneumothorax: Secondary | ICD-10-CM | POA: Diagnosis present

## 2022-04-15 DIAGNOSIS — F411 Generalized anxiety disorder: Secondary | ICD-10-CM | POA: Diagnosis present

## 2022-04-15 DIAGNOSIS — I10 Essential (primary) hypertension: Secondary | ICD-10-CM | POA: Diagnosis present

## 2022-04-15 DIAGNOSIS — Z8719 Personal history of other diseases of the digestive system: Secondary | ICD-10-CM

## 2022-04-15 DIAGNOSIS — Z6822 Body mass index (BMI) 22.0-22.9, adult: Secondary | ICD-10-CM

## 2022-04-15 DIAGNOSIS — Z85828 Personal history of other malignant neoplasm of skin: Secondary | ICD-10-CM

## 2022-04-15 DIAGNOSIS — R053 Chronic cough: Secondary | ICD-10-CM | POA: Diagnosis present

## 2022-04-15 DIAGNOSIS — E785 Hyperlipidemia, unspecified: Secondary | ICD-10-CM | POA: Diagnosis present

## 2022-04-15 DIAGNOSIS — Z79890 Hormone replacement therapy (postmenopausal): Secondary | ICD-10-CM

## 2022-04-15 DIAGNOSIS — Z79899 Other long term (current) drug therapy: Secondary | ICD-10-CM

## 2022-04-15 DIAGNOSIS — D72829 Elevated white blood cell count, unspecified: Secondary | ICD-10-CM | POA: Diagnosis present

## 2022-04-15 DIAGNOSIS — R7401 Elevation of levels of liver transaminase levels: Secondary | ICD-10-CM | POA: Diagnosis present

## 2022-04-15 DIAGNOSIS — Z882 Allergy status to sulfonamides status: Secondary | ICD-10-CM

## 2022-04-15 DIAGNOSIS — J432 Centrilobular emphysema: Secondary | ICD-10-CM | POA: Diagnosis present

## 2022-04-15 DIAGNOSIS — K219 Gastro-esophageal reflux disease without esophagitis: Secondary | ICD-10-CM | POA: Diagnosis present

## 2022-04-15 HISTORY — PX: CHEST TUBE INSERTION: SHX231

## 2022-04-15 HISTORY — PX: XI ROBOTIC ASSISTED PARAESOPHAGEAL HERNIA REPAIR: SHX6871

## 2022-04-15 LAB — CBC
HCT: 36.2 % (ref 36.0–46.0)
Hemoglobin: 11.4 g/dL — ABNORMAL LOW (ref 12.0–15.0)
MCH: 27.2 pg (ref 26.0–34.0)
MCHC: 31.5 g/dL (ref 30.0–36.0)
MCV: 86.4 fL (ref 80.0–100.0)
Platelets: 283 10*3/uL (ref 150–400)
RBC: 4.19 MIL/uL (ref 3.87–5.11)
RDW: 13.5 % (ref 11.5–15.5)
WBC: 11.4 10*3/uL — ABNORMAL HIGH (ref 4.0–10.5)
nRBC: 0 % (ref 0.0–0.2)

## 2022-04-15 LAB — CREATININE, SERUM
Creatinine, Ser: 0.66 mg/dL (ref 0.44–1.00)
GFR, Estimated: >60 mL/min (ref >60)

## 2022-04-15 SURGERY — REPAIR, HERNIA, PARAESOPHAGEAL, ROBOT-ASSISTED
Anesthesia: General | Site: Abdomen

## 2022-04-15 SURGERY — REPAIR, HERNIA, PARAESOPHAGEAL, ROBOT-ASSISTED
Anesthesia: General | Site: Chest | Laterality: Right

## 2022-04-15 MED ORDER — CEFAZOLIN SODIUM-DEXTROSE 2-4 GM/100ML-% IV SOLN: 2.0000 g | Freq: Three times a day (TID) | INTRAVENOUS | Status: DC

## 2022-04-15 MED ORDER — LIDOCAINE HCL (CARDIAC) PF 100 MG/5ML IV SOSY
PREFILLED_SYRINGE | INTRAVENOUS | Status: DC | PRN
  Administered 2022-04-15: 100 mg via INTRAVENOUS

## 2022-04-15 MED ORDER — CEFAZOLIN SODIUM-DEXTROSE 2-4 GM/100ML-% IV SOLN
INTRAVENOUS | Status: AC
  Administered 2022-04-15: 2 g via INTRAVENOUS
  Filled 2022-04-15: qty 100

## 2022-04-15 MED ORDER — CHLORHEXIDINE GLUCONATE 0.12 % MT SOLN
OROMUCOSAL | Status: AC
  Administered 2022-04-15: 15 mL via OROMUCOSAL
  Filled 2022-04-15: qty 15

## 2022-04-15 MED ORDER — ROCURONIUM BROMIDE 10 MG/ML (PF) SYRINGE
PREFILLED_SYRINGE | INTRAVENOUS | Status: AC
  Filled 2022-04-15: qty 10

## 2022-04-15 MED ORDER — DEXAMETHASONE SODIUM PHOSPHATE 10 MG/ML IJ SOLN
INTRAMUSCULAR | Status: DC | PRN
  Administered 2022-04-15: 5 mg via INTRAVENOUS

## 2022-04-15 MED ORDER — EPHEDRINE SULFATE (PRESSORS) 50 MG/ML IJ SOLN
INTRAMUSCULAR | Status: DC | PRN
  Administered 2022-04-15 (×2): 10 mg via INTRAVENOUS
  Administered 2022-04-15 (×2): 15 mg via INTRAVENOUS

## 2022-04-15 MED ORDER — ORAL CARE MOUTH RINSE: 15.0000 mL | Freq: Once | OROMUCOSAL | Status: AC

## 2022-04-15 MED ORDER — SODIUM CHLORIDE 0.9 % IV SOLN: INTRAVENOUS | Status: DC

## 2022-04-15 MED ORDER — ONDANSETRON HCL 4 MG/2ML IJ SOLN
INTRAMUSCULAR | Status: AC
  Filled 2022-04-15: qty 2

## 2022-04-15 MED ORDER — FENTANYL CITRATE (PF) 100 MCG/2ML IJ SOLN
INTRAMUSCULAR | Status: AC
  Administered 2022-04-15: 25 ug via INTRAVENOUS
  Filled 2022-04-15: qty 2

## 2022-04-15 MED ORDER — SUCCINYLCHOLINE CHLORIDE 200 MG/10ML IV SOSY
PREFILLED_SYRINGE | INTRAVENOUS | Status: DC | PRN
  Administered 2022-04-15: 100 mg via INTRAVENOUS

## 2022-04-15 MED ORDER — BISOPROLOL-HYDROCHLOROTHIAZIDE 5-6.25 MG PO TABS: 1.0000 | ORAL_TABLET | Freq: Every day | ORAL | Status: DC

## 2022-04-15 MED ORDER — BUPIVACAINE HCL (PF) 0.25 % IJ SOLN
INTRAMUSCULAR | Status: AC
  Filled 2022-04-15: qty 30

## 2022-04-15 MED ORDER — CHLORHEXIDINE GLUCONATE CLOTH 2 % EX PADS: 6.0000 | MEDICATED_PAD | Freq: Once | CUTANEOUS | Status: DC

## 2022-04-15 MED ORDER — ROCURONIUM BROMIDE 100 MG/10ML IV SOLN
INTRAVENOUS | Status: DC | PRN
  Administered 2022-04-15: 30 mg via INTRAVENOUS
  Administered 2022-04-15: 10 mg via INTRAVENOUS
  Administered 2022-04-15: 20 mg via INTRAVENOUS

## 2022-04-15 MED ORDER — PHENYLEPHRINE 80 MCG/ML (10ML) SYRINGE FOR IV PUSH (FOR BLOOD PRESSURE SUPPORT)
PREFILLED_SYRINGE | INTRAVENOUS | Status: AC
  Filled 2022-04-15: qty 10

## 2022-04-15 MED ORDER — PROCHLORPERAZINE EDISYLATE 10 MG/2ML IJ SOLN: 5.0000 mg | Freq: Four times a day (QID) | INTRAMUSCULAR | Status: DC | PRN

## 2022-04-15 MED ORDER — CEFAZOLIN SODIUM-DEXTROSE 2-4 GM/100ML-% IV SOLN
2.0000 g | INTRAVENOUS | Status: AC
  Administered 2022-04-15: 2 g via INTRAVENOUS

## 2022-04-15 MED ORDER — PHENYLEPHRINE HCL (PRESSORS) 10 MG/ML IV SOLN
INTRAVENOUS | Status: DC | PRN
  Administered 2022-04-15: 80 ug via INTRAVENOUS

## 2022-04-15 MED ORDER — PROPOFOL 10 MG/ML IV BOLUS
INTRAVENOUS | Status: DC | PRN
  Administered 2022-04-15: 110 mg via INTRAVENOUS

## 2022-04-15 MED ORDER — 0.9 % SODIUM CHLORIDE (POUR BTL) OPTIME
TOPICAL | Status: DC | PRN
  Administered 2022-04-15: 500 mL

## 2022-04-15 MED ORDER — PHENYLEPHRINE HCL (PRESSORS) 10 MG/ML IV SOLN
INTRAVENOUS | Status: AC
  Filled 2022-04-15: qty 1

## 2022-04-15 MED ORDER — PHENYLEPHRINE HCL (PRESSORS) 10 MG/ML IV SOLN
INTRAVENOUS | Status: DC | PRN
  Administered 2022-04-15 (×2): 160 ug via INTRAVENOUS
  Administered 2022-04-15 (×4): 80 ug via INTRAVENOUS

## 2022-04-15 MED ORDER — VISTASEAL 10 ML SINGLE DOSE KIT
PACK | CUTANEOUS | Status: DC | PRN
  Administered 2022-04-15: 10 mL via TOPICAL

## 2022-04-15 MED ORDER — ESMOLOL HCL 100 MG/10ML IV SOLN
INTRAVENOUS | Status: DC | PRN
  Administered 2022-04-15 (×2): 10 mg via INTRAVENOUS

## 2022-04-15 MED ORDER — EPHEDRINE 5 MG/ML INJ
INTRAVENOUS | Status: AC
  Filled 2022-04-15: qty 5

## 2022-04-15 MED ORDER — BUPIVACAINE LIPOSOME 1.3 % IJ SUSP
INTRAMUSCULAR | Status: DC | PRN
  Administered 2022-04-15: 50 mL via INTRAMUSCULAR

## 2022-04-15 MED ORDER — OXYCODONE HCL 5 MG/5ML PO SOLN: 5.0000 mg | Freq: Once | ORAL | Status: DC | PRN

## 2022-04-15 MED ORDER — ALBUTEROL SULFATE (2.5 MG/3ML) 0.083% IN NEBU: 3.0000 mL | INHALATION_SOLUTION | Freq: Four times a day (QID) | RESPIRATORY_TRACT | Status: DC | PRN

## 2022-04-15 MED ORDER — LIDOCAINE HCL (PF) 2 % IJ SOLN
INTRAMUSCULAR | Status: AC
  Filled 2022-04-15: qty 5

## 2022-04-15 MED ORDER — LACTATED RINGERS IV SOLN: INTRAVENOUS | Status: DC

## 2022-04-15 MED ORDER — ACETAMINOPHEN 10 MG/ML IV SOLN
INTRAVENOUS | Status: AC
  Filled 2022-04-15: qty 100

## 2022-04-15 MED ORDER — OXYCODONE HCL 5 MG PO TABS: 5.0000 mg | ORAL_TABLET | ORAL | Status: DC | PRN

## 2022-04-15 MED ORDER — BUPIVACAINE LIPOSOME 1.3 % IJ SUSP
INTRAMUSCULAR | Status: AC
  Filled 2022-04-15: qty 20

## 2022-04-15 MED ORDER — HYDRALAZINE HCL 20 MG/ML IJ SOLN
10.0000 mg | INTRAMUSCULAR | Status: DC | PRN
  Administered 2022-04-18: 10 mg via INTRAVENOUS
  Filled 2022-04-15: qty 1

## 2022-04-15 MED ORDER — FENTANYL CITRATE (PF) 100 MCG/2ML IJ SOLN
INTRAMUSCULAR | Status: DC | PRN
  Administered 2022-04-15 (×2): 50 ug via INTRAVENOUS

## 2022-04-15 MED ORDER — "VISTASEAL 4 ML SINGLE DOSE KIT "
PACK | CUTANEOUS | Status: AC
  Filled 2022-04-15: qty 4

## 2022-04-15 MED ORDER — METOCLOPRAMIDE HCL 5 MG PO TABS: 5.0000 mg | ORAL_TABLET | Freq: Three times a day (TID) | ORAL | Status: DC | PRN

## 2022-04-15 MED ORDER — PIPERACILLIN-TAZOBACTAM 3.375 G IVPB
INTRAVENOUS | Status: AC
  Filled 2022-04-15: qty 50

## 2022-04-15 MED ORDER — FENTANYL CITRATE (PF) 250 MCG/5ML IJ SOLN
INTRAMUSCULAR | Status: AC
  Filled 2022-04-15: qty 5

## 2022-04-15 MED ORDER — PIPERACILLIN-TAZOBACTAM 3.375 G IVPB
3.3750 g | Freq: Three times a day (TID) | INTRAVENOUS | Status: DC
  Administered 2022-04-15 – 2022-04-28 (×40): 3.375 g via INTRAVENOUS
  Filled 2022-04-15 (×38): qty 50

## 2022-04-15 MED ORDER — FENTANYL CITRATE (PF) 100 MCG/2ML IJ SOLN
INTRAMUSCULAR | Status: AC
  Filled 2022-04-15: qty 2

## 2022-04-15 MED ORDER — PROPOFOL 10 MG/ML IV BOLUS
INTRAVENOUS | Status: AC
  Filled 2022-04-15: qty 20

## 2022-04-15 MED ORDER — DILTIAZEM HCL ER COATED BEADS 120 MG PO CP24
240.0000 mg | ORAL_CAPSULE | Freq: Every day | ORAL | Status: DC
  Filled 2022-04-15: qty 1

## 2022-04-15 MED ORDER — OXYCODONE HCL 5 MG PO TABS: 5.0000 mg | ORAL_TABLET | Freq: Once | ORAL | Status: DC | PRN

## 2022-04-15 MED ORDER — GABAPENTIN 300 MG PO CAPS: 300.0000 mg | ORAL_CAPSULE | ORAL | Status: AC

## 2022-04-15 MED ORDER — FENTANYL CITRATE (PF) 100 MCG/2ML IJ SOLN
INTRAMUSCULAR | Status: DC | PRN
  Administered 2022-04-15 (×3): 50 ug via INTRAVENOUS

## 2022-04-15 MED ORDER — PHENYLEPHRINE HCL-NACL 20-0.9 MG/250ML-% IV SOLN
INTRAVENOUS | Status: DC | PRN
  Administered 2022-04-15: 50 ug/min via INTRAVENOUS

## 2022-04-15 MED ORDER — BISOPROLOL FUMARATE 5 MG PO TABS
5.0000 mg | ORAL_TABLET | Freq: Once | ORAL | Status: AC
  Administered 2022-04-15: 5 mg via ORAL
  Filled 2022-04-15: qty 1

## 2022-04-15 MED ORDER — "VISTASEAL 4 ML SINGLE DOSE KIT "
PACK | CUTANEOUS | Status: DC | PRN
  Administered 2022-04-15: 4 mL via TOPICAL

## 2022-04-15 MED ORDER — DIPHENHYDRAMINE HCL 12.5 MG/5ML PO ELIX: 12.5000 mg | ORAL_SOLUTION | Freq: Four times a day (QID) | ORAL | Status: DC | PRN

## 2022-04-15 MED ORDER — SUCCINYLCHOLINE CHLORIDE 200 MG/10ML IV SOSY
PREFILLED_SYRINGE | INTRAVENOUS | Status: AC
  Filled 2022-04-15: qty 10

## 2022-04-15 MED ORDER — ACETAMINOPHEN 500 MG PO TABS: 1000.0000 mg | ORAL_TABLET | ORAL | Status: AC

## 2022-04-15 MED ORDER — METOPROLOL TARTRATE 5 MG/5ML IV SOLN
INTRAVENOUS | Status: AC
  Filled 2022-04-15: qty 5

## 2022-04-15 MED ORDER — DEXAMETHASONE SODIUM PHOSPHATE 10 MG/ML IJ SOLN
INTRAMUSCULAR | Status: AC
  Filled 2022-04-15: qty 1

## 2022-04-15 MED ORDER — CHLORHEXIDINE GLUCONATE CLOTH 2 % EX PADS
6.0000 | MEDICATED_PAD | Freq: Once | CUTANEOUS | Status: AC
  Administered 2022-04-15: 6 via TOPICAL

## 2022-04-15 MED ORDER — ONDANSETRON HCL 4 MG/2ML IJ SOLN
INTRAMUSCULAR | Status: DC | PRN
  Administered 2022-04-15: 4 mg via INTRAVENOUS

## 2022-04-15 MED ORDER — ONDANSETRON HCL 4 MG/2ML IJ SOLN
4.0000 mg | Freq: Once | INTRAMUSCULAR | Status: AC
  Administered 2022-04-15: 4 mg via INTRAVENOUS

## 2022-04-15 MED ORDER — ACETAMINOPHEN 10 MG/ML IV SOLN
INTRAVENOUS | Status: DC | PRN
  Administered 2022-04-15: 1000 mg via INTRAVENOUS

## 2022-04-15 MED ORDER — FENTANYL CITRATE (PF) 100 MCG/2ML IJ SOLN
25.0000 ug | INTRAMUSCULAR | Status: DC | PRN
  Administered 2022-04-15 (×4): 25 ug via INTRAVENOUS

## 2022-04-15 MED ORDER — PROPOFOL 10 MG/ML IV BOLUS
INTRAVENOUS | Status: DC | PRN
  Administered 2022-04-15: 140 mg via INTRAVENOUS

## 2022-04-15 MED ORDER — KETOROLAC TROMETHAMINE 15 MG/ML IJ SOLN
INTRAMUSCULAR | Status: AC
  Filled 2022-04-15: qty 1

## 2022-04-15 MED ORDER — DIPHENHYDRAMINE HCL 50 MG/ML IJ SOLN: 12.5000 mg | Freq: Four times a day (QID) | INTRAMUSCULAR | Status: DC | PRN

## 2022-04-15 MED ORDER — MELATONIN 5 MG PO TABS: 5.0000 mg | ORAL_TABLET | Freq: Every day | ORAL | Status: DC | PRN

## 2022-04-15 MED ORDER — MORPHINE SULFATE (PF) 4 MG/ML IV SOLN
INTRAVENOUS | Status: AC
  Filled 2022-04-15: qty 1

## 2022-04-15 MED ORDER — ESMOLOL HCL 100 MG/10ML IV SOLN
INTRAVENOUS | Status: AC
  Filled 2022-04-15: qty 10

## 2022-04-15 MED ORDER — SODIUM CHLORIDE 0.9 % IR SOLN
Status: DC | PRN
  Administered 2022-04-15: 1000 mL

## 2022-04-15 MED ORDER — VISTASEAL 10 ML SINGLE DOSE KIT
PACK | CUTANEOUS | Status: AC
  Filled 2022-04-15: qty 10

## 2022-04-15 MED ORDER — KETOROLAC TROMETHAMINE 15 MG/ML IJ SOLN
15.0000 mg | Freq: Four times a day (QID) | INTRAMUSCULAR | Status: AC
  Administered 2022-04-15 – 2022-04-20 (×18): 15 mg via INTRAVENOUS
  Filled 2022-04-15 (×18): qty 1

## 2022-04-15 MED ORDER — LEVOTHYROXINE SODIUM 50 MCG PO TABS: 75.0000 ug | ORAL_TABLET | Freq: Every day | ORAL | Status: DC

## 2022-04-15 MED ORDER — ROCURONIUM BROMIDE 100 MG/10ML IV SOLN
INTRAVENOUS | Status: DC | PRN
  Administered 2022-04-15: 40 mg via INTRAVENOUS
  Administered 2022-04-15 (×2): 10 mg via INTRAVENOUS

## 2022-04-15 MED ORDER — ACETAMINOPHEN 500 MG PO TABS
ORAL_TABLET | ORAL | Status: AC
  Administered 2022-04-15: 1000 mg via ORAL
  Filled 2022-04-15: qty 2

## 2022-04-15 MED ORDER — FENTANYL CITRATE (PF) 100 MCG/2ML IJ SOLN
25.0000 ug | INTRAMUSCULAR | Status: DC | PRN
  Administered 2022-04-15: 25 ug via INTRAVENOUS

## 2022-04-15 MED ORDER — METOPROLOL TARTRATE 5 MG/5ML IV SOLN
INTRAVENOUS | Status: DC | PRN
  Administered 2022-04-15: 2 mg via INTRAVENOUS

## 2022-04-15 MED ORDER — EPINEPHRINE PF 1 MG/ML IJ SOLN
INTRAMUSCULAR | Status: AC
  Filled 2022-04-15: qty 1

## 2022-04-15 MED ORDER — DEXTROSE IN LACTATED RINGERS 5 % IV SOLN: INTRAVENOUS | Status: DC

## 2022-04-15 MED ORDER — PROCHLORPERAZINE MALEATE 10 MG PO TABS: 10.0000 mg | ORAL_TABLET | Freq: Four times a day (QID) | ORAL | Status: DC | PRN

## 2022-04-15 MED ORDER — LIDOCAINE HCL (CARDIAC) PF 100 MG/5ML IV SOSY
PREFILLED_SYRINGE | INTRAVENOUS | Status: DC | PRN
  Administered 2022-04-15: 80 mg via INTRAVENOUS

## 2022-04-15 MED ORDER — METHYLENE BLUE 1 % INJ SOLN
INTRAVENOUS | Status: AC
  Filled 2022-04-15: qty 10

## 2022-04-15 MED ORDER — IOHEXOL 300 MG/ML  SOLN
150.0000 mL | Freq: Once | INTRAMUSCULAR | Status: AC | PRN
  Administered 2022-04-15: 90 mL via ORAL

## 2022-04-15 MED ORDER — ONDANSETRON HCL 4 MG PO TABS: 4.0000 mg | ORAL_TABLET | Freq: Three times a day (TID) | ORAL | Status: DC | PRN

## 2022-04-15 MED ORDER — LACTATED RINGERS IV SOLN: INTRAVENOUS | Status: DC | PRN

## 2022-04-15 MED ORDER — PIPERACILLIN-TAZOBACTAM 3.375 G IVPB 30 MIN: 3.3750 g | Freq: Three times a day (TID) | INTRAVENOUS | Status: DC

## 2022-04-15 MED ORDER — MORPHINE SULFATE (PF) 4 MG/ML IV SOLN
2.0000 mg | INTRAVENOUS | Status: DC | PRN
  Administered 2022-04-15 – 2022-04-28 (×19): 2 mg via INTRAVENOUS
  Filled 2022-04-15 (×19): qty 1

## 2022-04-15 MED ORDER — SUGAMMADEX SODIUM 200 MG/2ML IV SOLN
INTRAVENOUS | Status: DC | PRN
  Administered 2022-04-15: 200 mg via INTRAVENOUS

## 2022-04-15 MED ORDER — SUGAMMADEX SODIUM 200 MG/2ML IV SOLN
INTRAVENOUS | Status: DC | PRN
  Administered 2022-04-15: 120 mg via INTRAVENOUS

## 2022-04-15 MED ORDER — GABAPENTIN 300 MG PO CAPS
ORAL_CAPSULE | ORAL | Status: AC
  Administered 2022-04-15: 300 mg via ORAL
  Filled 2022-04-15: qty 1

## 2022-04-15 MED ORDER — ENOXAPARIN SODIUM 40 MG/0.4ML IJ SOSY
40.0000 mg | PREFILLED_SYRINGE | INTRAMUSCULAR | Status: DC
  Administered 2022-04-16 – 2022-04-27 (×11): 40 mg via SUBCUTANEOUS
  Filled 2022-04-15 (×11): qty 0.4

## 2022-04-15 MED ORDER — CEFAZOLIN SODIUM-DEXTROSE 2-4 GM/100ML-% IV SOLN
INTRAVENOUS | Status: AC
  Filled 2022-04-15: qty 100

## 2022-04-15 MED ORDER — CHLORHEXIDINE GLUCONATE 0.12 % MT SOLN: 15.0000 mL | Freq: Once | OROMUCOSAL | Status: AC

## 2022-04-15 MED ORDER — NAPHAZOLINE-GLYCERIN 0.012-0.25 % OP SOLN: 1.0000 [drp] | Freq: Four times a day (QID) | OPHTHALMIC | Status: DC | PRN

## 2022-04-15 MED ORDER — ACETAMINOPHEN 500 MG PO TABS: 1000.0000 mg | ORAL_TABLET | Freq: Four times a day (QID) | ORAL | Status: DC

## 2022-04-15 MED ORDER — FUROSEMIDE 40 MG PO TABS: 40.0000 mg | ORAL_TABLET | Freq: Every day | ORAL | Status: DC | PRN

## 2022-04-15 SURGICAL SUPPLY — 70 items
APPLICATOR VISTASEAL 35 (MISCELLANEOUS) IMPLANT
BULB RESERV EVAC DRAIN JP 100C (MISCELLANEOUS) IMPLANT
CANNULA REDUC XI 12-8 STAPL (CANNULA) ×3
CANNULA REDUCER 12-8 DVNC XI (CANNULA) ×3 IMPLANT
CATH ROBINSON RED A/P 18FR (CATHETERS) IMPLANT
DERMABOND ADVANCED .7 DNX12 (GAUZE/BANDAGES/DRESSINGS) ×3 IMPLANT
DRAIN CHANNEL JP 19F (MISCELLANEOUS) IMPLANT
DRAPE ARM DVNC X/XI (DISPOSABLE) ×12 IMPLANT
DRAPE COLUMN DVNC XI (DISPOSABLE) ×3 IMPLANT
DRAPE DA VINCI XI ARM (DISPOSABLE) ×12
DRAPE DA VINCI XI COLUMN (DISPOSABLE) ×3
DRSG OPSITE POSTOP 3X4 (GAUZE/BANDAGES/DRESSINGS) IMPLANT
ELECT CAUTERY BLADE 6.4 (BLADE) ×3 IMPLANT
ELECT REM PT RETURN 9FT ADLT (ELECTROSURGICAL) ×3
ELECTRODE REM PT RTRN 9FT ADLT (ELECTROSURGICAL) ×3 IMPLANT
GLOVE BIO SURGEON STRL SZ7 (GLOVE) ×9 IMPLANT
GOWN STRL REUS W/ TWL LRG LVL3 (GOWN DISPOSABLE) ×12 IMPLANT
GOWN STRL REUS W/TWL LRG LVL3 (GOWN DISPOSABLE) ×12
GRASPER LAPSCPC 5X45 DSP (INSTRUMENTS) ×3 IMPLANT
GRASPER SUT TROCAR 14GX15 (MISCELLANEOUS) IMPLANT
HANDLE YANKAUER SUCT BULB TIP (MISCELLANEOUS) IMPLANT
IRRIGATION STRYKERFLOW (MISCELLANEOUS) IMPLANT
IRRIGATOR STRYKERFLOW (MISCELLANEOUS) ×3
IV NS 1000ML (IV SOLUTION) ×3
IV NS 1000ML BAXH (IV SOLUTION) IMPLANT
KIT PINK PAD W/HEAD ARE REST (MISCELLANEOUS) ×3 IMPLANT
KIT PINK PAD W/HEAD ARM REST (MISCELLANEOUS) ×3 IMPLANT
LABEL OR SOLS (LABEL) ×3 IMPLANT
MANIFOLD NEPTUNE II (INSTRUMENTS) ×3 IMPLANT
NEEDLE HYPO 22GX1.5 SAFETY (NEEDLE) ×3 IMPLANT
OBTURATOR OPTICAL STANDARD 8MM (TROCAR) ×3
OBTURATOR OPTICAL STND 8 DVNC (TROCAR) ×3
OBTURATOR OPTICALSTD 8 DVNC (TROCAR) ×3 IMPLANT
PACK LAP CHOLECYSTECTOMY (MISCELLANEOUS) ×3 IMPLANT
PENCIL SMOKE EVACUATOR (MISCELLANEOUS) ×3 IMPLANT
PLUG CATH AND CAP STER (CATHETERS) IMPLANT
SEAL CANN UNIV 5-8 DVNC XI (MISCELLANEOUS) ×9 IMPLANT
SEAL XI 5MM-8MM UNIVERSAL (MISCELLANEOUS) ×9
SEALER VESSEL DA VINCI XI (MISCELLANEOUS) ×3
SEALER VESSEL EXT DVNC XI (MISCELLANEOUS) ×3 IMPLANT
SOLUTION ELECTROLUBE (MISCELLANEOUS) ×3 IMPLANT
SPIKE FLUID TRANSFER (MISCELLANEOUS) ×3 IMPLANT
SPONGE DRAIN TRACH 4X4 STRL 2S (GAUZE/BANDAGES/DRESSINGS) IMPLANT
SPONGE T-LAP 18X18 ~~LOC~~+RFID (SPONGE) ×3 IMPLANT
STAPLER CANNULA SEAL DVNC XI (STAPLE) ×3 IMPLANT
STAPLER CANNULA SEAL XI (STAPLE) ×3
SUT ETHILON 2 0 FS 18 (SUTURE) IMPLANT
SUT ETHILON 3-0 FS-10 30 BLK (SUTURE) ×3
SUT MNCRL 4-0 (SUTURE) ×3
SUT MNCRL 4-0 27XMFL (SUTURE) ×3
SUT SILK 2 0 SH (SUTURE) ×6 IMPLANT
SUT SILK 2 0SH CR/8 30 (SUTURE) IMPLANT
SUT VIC AB 3-0 SH 27 (SUTURE)
SUT VIC AB 3-0 SH 27X BRD (SUTURE) IMPLANT
SUT VICRYL 0 UR6 27IN ABS (SUTURE) ×6 IMPLANT
SUT VICRYL 3-0 CR8 SH (SUTURE) IMPLANT
SUT VLOC 90 S/L VL9 GS22 (SUTURE) ×3 IMPLANT
SUTURE EHLN 3-0 FS-10 30 BLK (SUTURE) IMPLANT
SUTURE MNCRL 4-0 27XMF (SUTURE) ×3 IMPLANT
SYR 30ML LL (SYRINGE) ×3 IMPLANT
SYS BAG RETRIEVAL 10MM (BASKET)
SYSTEM BAG RETRIEVAL 10MM (BASKET) IMPLANT
SYSTEM SAHARA CHEST DRAIN ATS (WOUND CARE) IMPLANT
TRAP FLUID SMOKE EVACUATOR (MISCELLANEOUS) ×3 IMPLANT
TRAY FOLEY SLVR 16FR LF STAT (SET/KITS/TRAYS/PACK) ×3 IMPLANT
TRAY WAYNE PNEUMOTHORAX 14X18 (TRAY / TRAY PROCEDURE) IMPLANT
TROCAR XCEL NON-BLD 5MMX100MML (ENDOMECHANICALS) ×3 IMPLANT
TROCAR Z-THREAD OPTICAL 5X100M (TROCAR) IMPLANT
TUBING EVAC SMOKE HEATED PNEUM (TUBING) ×3 IMPLANT
WATER STERILE IRR 500ML POUR (IV SOLUTION) ×3 IMPLANT

## 2022-04-15 SURGICAL SUPPLY — 57 items
APPLICATOR VISTASEAL 35 (MISCELLANEOUS) IMPLANT
BLADE SURG 15 STRL LF DISP TIS (BLADE) IMPLANT
BLADE SURG 15 STRL SS (BLADE) ×1
CANNULA REDUC XI 12-8 STAPL (CANNULA) ×1
CANNULA REDUCER 12-8 DVNC XI (CANNULA) ×1 IMPLANT
DERMABOND ADVANCED .7 DNX12 (GAUZE/BANDAGES/DRESSINGS) ×1 IMPLANT
DRAPE ARM DVNC X/XI (DISPOSABLE) ×4 IMPLANT
DRAPE COLUMN DVNC XI (DISPOSABLE) ×1 IMPLANT
DRAPE DA VINCI XI ARM (DISPOSABLE) ×4
DRAPE DA VINCI XI COLUMN (DISPOSABLE) ×1
ELECT CAUTERY BLADE 6.4 (BLADE) ×1 IMPLANT
ELECT REM PT RETURN 9FT ADLT (ELECTROSURGICAL) ×1
ELECTRODE REM PT RTRN 9FT ADLT (ELECTROSURGICAL) ×1 IMPLANT
GLOVE BIO SURGEON STRL SZ7 (GLOVE) ×3 IMPLANT
GOWN STRL REUS W/ TWL LRG LVL3 (GOWN DISPOSABLE) ×4 IMPLANT
GOWN STRL REUS W/TWL LRG LVL3 (GOWN DISPOSABLE) ×4
GRASPER LAPSCPC 5X45 DSP (INSTRUMENTS) ×1 IMPLANT
IRRIGATION STRYKERFLOW (MISCELLANEOUS) IMPLANT
IRRIGATOR STRYKERFLOW (MISCELLANEOUS)
IV NS 1000ML (IV SOLUTION) ×1
IV NS 1000ML BAXH (IV SOLUTION) IMPLANT
KIT PINK PAD W/HEAD ARE REST (MISCELLANEOUS) ×1
KIT PINK PAD W/HEAD ARM REST (MISCELLANEOUS) ×1 IMPLANT
LABEL OR SOLS (LABEL) ×1 IMPLANT
MANIFOLD NEPTUNE II (INSTRUMENTS) ×1 IMPLANT
MESH BIO-A 7X10 SYN MAT (Mesh General) IMPLANT
NEEDLE HYPO 22GX1.5 SAFETY (NEEDLE) ×1 IMPLANT
OBTURATOR OPTICAL STANDARD 8MM (TROCAR) ×1
OBTURATOR OPTICAL STND 8 DVNC (TROCAR) ×1
OBTURATOR OPTICALSTD 8 DVNC (TROCAR) ×1 IMPLANT
PACK LAP CHOLECYSTECTOMY (MISCELLANEOUS) ×1 IMPLANT
PENCIL SMOKE EVACUATOR (MISCELLANEOUS) ×1 IMPLANT
SEAL CANN UNIV 5-8 DVNC XI (MISCELLANEOUS) ×3 IMPLANT
SEAL XI 5MM-8MM UNIVERSAL (MISCELLANEOUS) ×3
SEALER VESSEL DA VINCI XI (MISCELLANEOUS) ×2
SEALER VESSEL EXT DVNC XI (MISCELLANEOUS) ×1 IMPLANT
SOLUTION ELECTROLUBE (MISCELLANEOUS) ×1 IMPLANT
SPIKE FLUID TRANSFER (MISCELLANEOUS) ×1 IMPLANT
SPONGE T-LAP 18X18 ~~LOC~~+RFID (SPONGE) ×1 IMPLANT
STAPLER CANNULA SEAL DVNC XI (STAPLE) ×1 IMPLANT
STAPLER CANNULA SEAL XI (STAPLE) ×1
SUT MNCRL 4-0 (SUTURE) ×2
SUT MNCRL 4-0 27XMFL (SUTURE) ×2
SUT SILK 2 0 SH (SUTURE) ×2 IMPLANT
SUT VIC AB 3-0 SH 27 (SUTURE)
SUT VIC AB 3-0 SH 27X BRD (SUTURE) IMPLANT
SUT VICRYL 0 AB UR-6 (SUTURE) IMPLANT
SUT VICRYL 0 UR6 27IN ABS (SUTURE) ×2 IMPLANT
SUT VLOC 90 S/L VL9 GS22 (SUTURE) ×1 IMPLANT
SUTURE MNCRL 4-0 27XMF (SUTURE) ×1 IMPLANT
SYR 30ML LL (SYRINGE) ×1 IMPLANT
SYS BAG RETRIEVAL 10MM (BASKET)
SYSTEM BAG RETRIEVAL 10MM (BASKET) IMPLANT
TRAP FLUID SMOKE EVACUATOR (MISCELLANEOUS) ×1 IMPLANT
TRAY FOLEY SLVR 16FR LF STAT (SET/KITS/TRAYS/PACK) ×1 IMPLANT
TROCAR XCEL NON-BLD 5MMX100MML (ENDOMECHANICALS) ×1 IMPLANT
TUBING EVAC SMOKE HEATED PNEUM (TUBING) ×1 IMPLANT

## 2022-04-15 NOTE — Anesthesia Preprocedure Evaluation (Signed)
Anesthesia Evaluation  Patient identified by MRN, date of birth, ID band Patient awake    Reviewed: Allergy & Precautions, NPO status , Patient's Chart, lab work & pertinent test results  History of Anesthesia Complications Negative for: history of anesthetic complications  Airway Mallampati: III  TM Distance: <3 FB Neck ROM: full    Dental  (+) Chipped   Pulmonary shortness of breath and with exertion, COPD, former smoker   Pulmonary exam normal        Cardiovascular Exercise Tolerance: Good hypertension, (-) angina (-) Past MI Normal cardiovascular exam     Neuro/Psych negative neurological ROS  negative psych ROS   GI/Hepatic Neg liver ROS, hiatal hernia,GERD  Controlled,,  Endo/Other  Hypothyroidism    Renal/GU      Musculoskeletal   Abdominal   Peds  Hematology negative hematology ROS (+)   Anesthesia Other Findings Past Medical History: No date: Anemia No date: Aortic atherosclerosis (HCC) No date: B12 deficiency No date: Cancer ([place])     Comment:  basal cell No date: Cataract cortical, senile No date: COPD (chronic obstructive pulmonary disease) (HCC) No date: Dyspnea No date: Emphysema of lung (HCC) No date: GERD (gastroesophageal reflux disease) No date: History of hiatal hernia No date: Hyperlipidemia No date: Hypertension No date: Hypothyroidism No date: Osteoarthritis of left hip No date: Osteoporosis No date: Shingles  Past Surgical History: No date: CATARACT EXTRACTION; Right 06/23/2016: COLONOSCOPY; N/A     Comment:  Procedure: COLONOSCOPY;  Surgeon: Manya Silvas, MD;               Location: [facility];  Service: Endoscopy;                Laterality: N/A; 2007: COLONOSCOPY 03/03/2021: ESOPHAGOGASTRODUODENOSCOPY 11/06/2020: HARDWARE REMOVAL; Right     Comment:  Procedure: EXCHANGE OF LAG SCREW, RIGHT HIP;  Surgeon:               Corky Mull, MD;  Location: [facility] ORS;   Service:               Orthopedics;  Laterality: Right; 04/07/2021: INSERTION OF MESH     Comment:  Procedure: INSERTION OF MESH;  Surgeon: Jules Husbands,               MD;  Location: [facility] ORS;  Service: General;; 12/26/2018: INTRAMEDULLARY (IM) NAIL INTERTROCHANTERIC; Right     Comment:  Procedure: INTRAMEDULLARY (IM) NAIL INTERTROCHANTRIC;                Surgeon: Corky Mull, MD;  Location: [facility] ORS;                Service: Orthopedics;  Laterality: Right; 04/07/2021: LAPAROSCOPIC NISSEN FUNDOPLICATION     Comment:  Procedure: LAPAROSCOPIC NISSEN FUNDOPLICATION;  Surgeon:              Jules Husbands, MD;  Location: [facility] ORS;  Service:               General;; 1970: TUBAL LIGATION 04/07/2021: XI ROBOTIC ASSISTED PARAESOPHAGEAL HERNIA REPAIR; N/A     Comment:  Procedure: XI ROBOTIC ASSISTED PARAESOPHAGEAL HERNIA               REPAIR with RNFA to assist;  Surgeon: Jules Husbands, MD;              Location: [facility] ORS;  Service: General;  Laterality: N/A;  BMI    Body Mass Index: 22.65 kg/m  Reproductive/Obstetrics negative OB ROS                             Anesthesia Physical Anesthesia Plan  ASA: 3  Anesthesia Plan: General ETT   Post-op Pain Management:    Induction: Intravenous  PONV Risk Score and Plan: Ondansetron, Dexamethasone, Midazolam and Treatment may vary due to age or medical condition  Airway Management Planned: Oral ETT  Additional Equipment:   Intra-op Plan:   Post-operative Plan: Extubation in OR  Informed Consent: I have reviewed the patients History and Physical, chart, labs and discussed the procedure including the risks, benefits and alternatives for the proposed anesthesia with the patient or authorized representative who has indicated his/her understanding and acceptance.     Dental Advisory Given  Plan Discussed with: Anesthesiologist, CRNA and Surgeon  Anesthesia Plan Comments: (Patient consented for risks of  anesthesia including but not limited to:  - adverse reactions to medications - damage to eyes, teeth, lips or other oral mucosa - nerve damage due to positioning  - sore throat or hoarseness - Damage to heart, brain, nerves, lungs, other parts of body or loss of life  Patient voiced understanding.)       Anesthesia Quick Evaluation

## 2022-04-15 NOTE — Op Note (Addendum)
Robotic assisted laparoscopic repair of  paraesophageal  hernia with Bio-A Mesh and Nissen fundoplication , Redo  Pre-operative Diagnosis: Recurrent hiatal hernia  Post-operative Diagnosis: same  Procedure:  Robotic assisted laparoscopic repair of  paraesophageal  hernia with Bio-A Mesh and Nissen fundoplication, redo  Surgeon: [name], MD FACS  Assistant: Carollee Leitz RNFA Required due to the complexity of the case the need for exposure and lack of first assist.  Anesthesia: Gen. 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 The benefits, complications, treatment options, and expected outcomes were discussed with the patient. The risks of bleeding, infection, recurrence of symptoms, failure to resolve symptoms,  esophageal damage, Dysphagia, bowel injury, any of which could require further surgery were reviewed with the patient. The likelihood of improving the patient's symptoms with return to their baseline status is good.  The patient and/or family concurred with the proposed plan, giving informed consent.  The patient was taken to Operating Room, identified  and the procedure verified.  A Time Out was held and the above information confirmed.  Prior to the induction of general anesthesia, antibiotic prophylaxis was administered. VTE prophylaxis was in place. General endotracheal anesthesia was then administered and tolerated well. After the induction, the abdomen was prepped with Chloraprep and draped in the sterile fashion. The patient was positioned in the supine position.  Cut down technique was used to enter the abdominal cavity and a  Hasson trochar was placed after two vicryl stitches were anchored to the fascia. Pneumoperitoneum was then created with CO2 and tolerated well without any adverse changes in the patient's vital signs.  Three 8-mm ports were placed under direct vision. All skin incisions  were infiltrated with a local anesthetic agent before making the incision and placing the trocars. An additional 5 mm regular laparoscopic port was placed to assist with retraction and exposure.   The patient was positioned  in reverse Trendelenburg, robot was brought to the surgical field and docked in the standard fashion.  We made sure all the instrumentation was kept indirect view at all times and that there were no collision between the arms. I scrubbed out and went to the console.  I used a robotic arm to retract the liver, the vessel sealer on my right hand and a forced bipolar grasper on my left hand.  There is along the extra 5 mm port allow me ample exposure and the ability to perform meticulous dissection  We Started dividing the lesser omentum via the pars flaccida.  We Were able to dissect the lesser curvature of the stomach and  dissected the fundus free from the right and left crus.    There was dense adhesion at the GE junction due to prior surgery, meticulous dissection was used to avoid injuries.  We saw sliding hernia w some of the fundu in the mediastinum, the wrap was still intact.  We circumferentially dissected the GE junction.  The hernia sac was also completely reduced and we were able to bring the stomach into the intra-abdominal position.  Attention then was turned to the greater curvature where the short gastrics were divided  with sealer device.  We were able to identify the left crus and again were able to make sure there was a good circumferential dissection and that the hernia sac was completely excised.  We did perform a good dissection within the mediastinum to allow a complete reduction of the sac, gain  esophageal length and a to completely allow an intra-abdominal fundoplication. Using two strips of Bio-A as pledgets we approximated the crus with a 2-0V lock suture. A bio-A 10x7 cm mesh was inserted and secured using Vistaseal and reinforced w multiple silks.   We Asked anesthesia to place a 50 French bougie and this went easily.  We also observe trajectory of the bougie. 360 degree Nissen fundoplication was created with multiple 2-0 silk sutures and we placed 3 stitches taking some of the esophagus within that bite.  The fundoplication measured approximately 3-1/2 cm and he was floppy. I was very happy with the way the fundoplication laid and the repair of the hernia.  Inspection of the  upper quadrant was performed. No bleeding, bile  Or esophageal injuries leaks, or bowel injuries were noted. Robotic instruments and robotic arms were undocked in the standard fashion. All the needles were removed under direct visualization.   I scrubbed back in.  Pneumoperitoneum was released.  The periumbilical port site was closed with interrumpted 0 Vicryl sutures. 4-0 subcuticular Monocryl was used to close the skin. Liposomal marcaine was injected to all the incisions sites.  Dermabond was  applied.  The patient was then extubated and brought to the recovery room in stable condition. Sponge, lap, and needle counts were correct at closure and at the conclusion of the case.          Picture shows repair , fundoplication is clearly below the diaphragm.       Media Information   Document Information  Operative Procedure Images    04/15/2022 10:52  Attached To:  Hospital Encounter on 04/15/22  Source Information  Default, Provider, MD   [name], MD, FACS

## 2022-04-15 NOTE — Progress Notes (Signed)
Patient seen and examined.  I did obtain a Gastrografin swallow given the redo nature of the surgery.  I did review it with our radiologist and there was concern for leak.  I went ahead and perform a dedicated CT esophagogram does show contrast extravasation.  The contrast does not empty into the stomach. Medically she is doing okay she is not toxic she is not febrile she does not have abdominal pain she has not had anything to eat or drink because we kept her n.p.o. until the contrast study. There is also a small right pneumothorax.  I do think that I will have to address that as well with small pigtail to be on the safe side.  I will also plan to do her esophagus either with omental patch or with a piece of fundus.  Intraoperatively I did not see any perforation and I doubt that I will be able to see another .  Decision will be intraoperatively to assess perforation. I Will likely also perform a jejunostomy tube. An extensive discussion with the husband and the patient regarding findings.  They understand.  Patient is competent and actually in good spirits considering the events. I had also an extensive discussion with Dr. Gorden Harms from thoracic surgery.  Did discuss at length options of esophageal stent versus thoracotomy .  Main issue with esophageal stent is that right now would not have any stents readily available and she will need to be transferred and this may delay care.  I do personally think that she is better taking care of in the operating room.

## 2022-04-15 NOTE — Progress Notes (Signed)
Patient awake, slight confusion, to surroundings, re-orients easily. Husband at bedside. Dr. Dahlia Byes spoke to husband and family members at length. Patient with L nare NGT to LIS Abd with JP drain, LUQ jejunostomy tube: clamped. Indwelling foley intact. Noted meth blue in OR, noted blue in Ngt and indwelling foley. Right chest tube 13ffr pigtail to 20cm.  Patient resting at this time, afebrile

## 2022-04-15 NOTE — Transfer of Care (Signed)
Immediate Anesthesia Transfer of Care Note  Patient: Kelly Wood  Procedure(s) Performed: XI ROBOTIC ASSISTED PARAESOPHAGEAL HERNIA REPAIR CHEST TUBE INSERTION (Right: Chest) XI ROBOTIC ASSISTED JEJUNOSTOMY TUBE PLACEMENT (Abdomen)  Patient Location: PACU  Anesthesia Type:General  Level of Consciousness: awake  Airway & Oxygen Therapy: Patient Spontanous Breathing and Patient connected to nasal cannula oxygen  Post-op Assessment: Report given to RN and Post -op Vital signs reviewed and stable  Post vital signs: Reviewed and stable  Last Vitals:  Vitals Value Taken Time  BP 117/73 04/15/22 2231  Temp 97.3f  Pulse 74 04/15/22 2232  Resp 10 04/15/22 2232  SpO2 99 % 04/15/22 2232  Vitals shown include unvalidated device data.  Last Pain:  Vitals:   04/15/22 1719  TempSrc:   PainSc: 10-Worst pain ever         Complications: No notable events documented.

## 2022-04-15 NOTE — Anesthesia Preprocedure Evaluation (Signed)
Anesthesia Evaluation  Patient identified by MRN, date of birth, ID band Patient awake    Reviewed: Allergy & Precautions, NPO status , Patient's Chart, lab work & pertinent test results  History of Anesthesia Complications Negative for: history of anesthetic complications  Airway Mallampati: IV  TM Distance: <3 FB Neck ROM: full    Dental  (+) Chipped, Dental Advidsory Given   Pulmonary shortness of breath and with exertion, COPD, former smoker   Pulmonary exam normal        Cardiovascular Exercise Tolerance: Good hypertension, (-) angina (-) Past MI Normal cardiovascular exam     Neuro/Psych negative neurological ROS  negative psych ROS   GI/Hepatic Neg liver ROS, hiatal hernia,GERD  Controlled,,  Endo/Other  Hypothyroidism    Renal/GU      Musculoskeletal   Abdominal   Peds  Hematology negative hematology ROS (+)   Anesthesia Other Findings Patient is POD 0 with paraesophageal surgery today with Dr. Dahlia Byes. Patient had a leak confirmed on CT scan and Dr. Dahlia Byes wants to take her back to surgery. Patient also has a small pneumothorax, and Dr. Dahlia Byes plans on placing a chest tube. Patient is saturating well and her only complaint is back pain.  Past Medical History: No date: Anemia No date: Aortic atherosclerosis (HCC) No date: B12 deficiency No date: Cancer ([place])     Comment:  basal cell No date: Cataract cortical, senile No date: COPD (chronic obstructive pulmonary disease) (HCC) No date: Dyspnea No date: Emphysema of lung (HCC) No date: GERD (gastroesophageal reflux disease) No date: History of hiatal hernia No date: Hyperlipidemia No date: Hypertension No date: Hypothyroidism No date: Osteoarthritis of left hip No date: Osteoporosis No date: Shingles  Past Surgical History: No date: CATARACT EXTRACTION; Right 06/23/2016: COLONOSCOPY; N/A     Comment:  Procedure: COLONOSCOPY;  Surgeon: Manya Silvas, MD;               Location: [facility];  Service: Endoscopy;                Laterality: N/A; 2007: COLONOSCOPY 03/03/2021: ESOPHAGOGASTRODUODENOSCOPY 11/06/2020: HARDWARE REMOVAL; Right     Comment:  Procedure: EXCHANGE OF LAG SCREW, RIGHT HIP;  Surgeon:               Corky Mull, MD;  Location: [facility] ORS;  Service:               Orthopedics;  Laterality: Right; 04/07/2021: INSERTION OF MESH     Comment:  Procedure: INSERTION OF MESH;  Surgeon: Jules Husbands,               MD;  Location: [facility] ORS;  Service: General;; 12/26/2018: INTRAMEDULLARY (IM) NAIL INTERTROCHANTERIC; Right     Comment:  Procedure: INTRAMEDULLARY (IM) NAIL INTERTROCHANTRIC;                Surgeon: Corky Mull, MD;  Location: [facility] ORS;                Service: Orthopedics;  Laterality: Right; 04/07/2021: LAPAROSCOPIC NISSEN FUNDOPLICATION     Comment:  Procedure: LAPAROSCOPIC NISSEN FUNDOPLICATION;  Surgeon:              Jules Husbands, MD;  Location: [facility] ORS;  Service:               General;; 1970: TUBAL LIGATION 04/07/2021: XI ROBOTIC ASSISTED PARAESOPHAGEAL HERNIA REPAIR; N/A     Comment:  Procedure: XI ROBOTIC ASSISTED PARAESOPHAGEAL  HERNIA               REPAIR with RNFA to assist;  Surgeon: Jules Husbands, MD;              Location: [facility] ORS;  Service: General;  Laterality: N/A;  BMI    Body Mass Index: 22.65 kg/m      Reproductive/Obstetrics negative OB ROS                             Anesthesia Physical Anesthesia Plan  ASA: 3 and emergent  Anesthesia Plan: General ETT   Post-op Pain Management:    Induction: Intravenous  PONV Risk Score and Plan: 4 or greater and Ondansetron and Dexamethasone  Airway Management Planned: Oral ETT  Additional Equipment:   Intra-op Plan:   Post-operative Plan: Extubation in OR  Informed Consent: I have reviewed the patients History and Physical, chart, labs and discussed the procedure including the risks, benefits  and alternatives for the proposed anesthesia with the patient or authorized representative who has indicated his/her understanding and acceptance.     Dental Advisory Given  Plan Discussed with: Anesthesiologist, CRNA and Surgeon  Anesthesia Plan Comments: (Patient consented for risks of anesthesia including but not limited to:  - adverse reactions to medications - damage to eyes, teeth, lips or other oral mucosa - nerve damage due to positioning  - sore throat or hoarseness - Damage to heart, brain, nerves, lungs, other parts of body or loss of life  Patient voiced understanding.)       Anesthesia Quick Evaluation

## 2022-04-15 NOTE — H&P (Signed)
HPI: Kelly Wood is s/p repair of paraesophageal hernia robotically 11 months ago.  Uneventful.  Comes with persistent bloating and eructation, as well as chest pdiscomfort.  No fevers no chills.  She is swallowing and she is eating .  Burping seems to alleviate symptoms.  No fevers no chills. SHe did have a CT scan that I personally reviewed.  There is evidence of a sliding recurrence this is radiographically.  When compared to the prior one before she had an upside stomach with a large type III paraesophageal hernia.  The wrap seems to be intact.  There is no evidence of perforation or other complications. Wrap seems to be migrating to the mediastinum  Main issue is fullness and burping. SHe still having intermittent sxs. Pulmonary sxs have improved significantly       Past Medical History:  Diagnosis Date   Anemia     Cancer ([place])      basal cell   COPD (chronic obstructive pulmonary disease) (HCC)     Dyspnea     GERD (gastroesophageal reflux disease)     Hyperlipidemia     Hypertension     Hypothyroidism             Past Surgical History:  Procedure Laterality Date   COLONOSCOPY N/A 06/23/2016    Procedure: COLONOSCOPY;  Surgeon: Manya Silvas, MD;  Location: [place];  Service: Endoscopy;  Laterality: N/A;   FEMUR FRACTURE SURGERY Right 02/07/2019    rod placed   HARDWARE REMOVAL Right 11/06/2020    Procedure: EXCHANGE OF LAG SCREW, RIGHT HIP;  Surgeon: Corky Mull, MD;  Location: [facility] OR[facility];  Service: Orthopedics;  Laterality: Right;   INSERTION OF MESH   04/07/2021    Procedure: INSERTION OF MESH;  Surgeon: Jules Husbands, MD;  Location: [facility];  Service: General;;   INTRAMEDULLARY (IM) NAIL INTERTROCHANTERIC Right 12/26/2018    Procedure: INTRAMEDULLARY (IM) NAIL INTERTROCHANTRIC;  Surgeon: Corky Mull, MD;  Location: [facility];  Service: Orthopedics;  Laterality: Right;   LAPAROSCOPIC NISSEN FUNDOPLICATION   12/11/9209    Procedure: LAPAROSCOPIC NISSEN FUNDOPLICATION;   Surgeon: Jules Husbands, MD;  Location: [facility];  Service: General;;   TUBAL LIGATION   1970   XI ROBOTIC ASSISTED PARAESOPHAGEAL HERNIA REPAIR N/A 04/07/2021    Procedure: XI ROBOTIC ASSISTED PARAESOPHAGEAL HERNIA REPAIR with RNFA to assist;  Surgeon: Jules Husbands, MD;  Location: [facility] ORS;  Service: General;  Laterality: N/A;           Family History  Problem Relation Age of Onset   Breast cancer Neg Hx        Social History:  reports that she quit smoking about 15 years ago. Her smoking use included cigarettes. She has never used smokeless tobacco. She reports current alcohol use of about 1.0 standard drink of alcohol per week. She reports that she does not use drugs.   Allergies:       Allergies  Allergen Reactions   Azithromycin Diarrhea   Levofloxacin Diarrhea      Pt prefers not to take because gi side effects   Sulfa Antibiotics Rash      Medications reviewed.       ROS Full ROS performed and is otherwise negative other than what is stated in HPI    Physical Exam Vitals and nursing note reviewed. Exam conducted with a chaperone present.  Constitutional:      General: She is not in acute distress.  Appearance: Normal appearance. She is not ill-appearing.  Cardiovascular:     Rate and Rhythm: Normal rate and regular rhythm.  Pulmonary:     Effort: Pulmonary effort is normal. No respiratory distress.     Breath sounds: Normal breath sounds. No stridor.  Abdominal:     General: Abdomen is flat. There is no distension.     Palpations: Abdomen is soft. There is no mass.     Tenderness: There is no abdominal tenderness.     Hernia: No hernia is present.  Musculoskeletal:        General: No swelling or tenderness. Normal range of motion.     Cervical back: Normal range of motion and neck supple. No rigidity or tenderness.  Skin:    General: Skin is warm and dry.     Capillary Refill: Capillary refill takes less than 2 seconds.     Coloration: Skin is not  jaundiced or pale.  Neurological:     General: No focal deficit present.     Mental Status: She is alert and oriented to person, place, and time.  Psychiatric:        Mood and Affect: Mood normal.        Behavior: Behavior normal.        Thought Content: Thought content normal.        Judgment: Judgment normal.      Assessment/Plan: 82 year old female with recurrent paraesophageal hernia that is symptomatic.  She does have some degree of frailty and malnutrition.    We will do redo surgery in January.  I was extremely candid with her regarding alcohol and expectations.  She is 82 she does not have the healthiest tissues and she is frail.  I will do my best to reinforce strict grew and also likely do a gastropexy to prevent migration of the stomach within the mediastinum.  We also discussed with her the risk the benefit and the potential complications.     [name], MD [facility] General Surgeon

## 2022-04-15 NOTE — Op Note (Signed)
Right chest tube placement Robotic assisted primary repair of esophageal perforation Robotic assisted jejunostomy tube Robotic mediastinal blake drain placement Removal of BioA mesh Omental flap as tissue buttressing  Pre-operative Diagnosis: Esophageal perforation  Post-operative Diagnosis: same  Surgeon: [name], MD FACS  Assistant: . Patience Phillip Heal RNFA Required due to the complexity of the case the need for exposure and lack of first assist.  Anesthesia: Gen. with endotracheal tube  Findings: 1 cm perforation at GE junction No evidence of leak , after instilling Methylene blue via NGT Mediastinal tissue were fragile from redo surgery and chronic issues  Estimated Blood Loss: 51WC    Complications: none   Procedure Details  The patient was seen again in the Holding Room. The benefits, complications, treatment options, and expected outcomes were discussed with the patient. The risks of bleeding, infection, recurrence of symptoms, failure to resolve symptoms,  esophageal damage, Dysphagia, bowel injury, any of which could require further surgery were reviewed with the patient. The likelihood of improving the patient's symptoms with return to their baseline status is good.  The patient and/or family concurred with the proposed plan, giving informed consent.  The patient was taken to Operating Room, identified  and the procedure verified.  A Time Out was held and the above information confirmed.  Prior to the induction of general anesthesia, antibiotic prophylaxis was administered. VTE prophylaxis was in place. General endotracheal anesthesia was then administered and tolerated well.  Right chest was prep and draped in the usual standard fashion using a needle guide the 5th intercostal space was identified and needle placed in to the pleural space, using modified Seldinger technique I place a Cook pig tail over the wire. It was connected to the pleurovac.   After the induction,  the abdomen was prepped with Chloraprep and draped in the sterile fashion. The patient was positioned in the supine position.  Cut down technique was used to enter the abdominal cavity and a Hasson trochar was placed after two vicryl stitches were anchored to the fascia. Pneumoperitoneum was then created with CO2 and tolerated well without any adverse changes in the patient's vital signs.  Three 8-mm ports were placed under direct vision. All skin incisions  were infiltrated with a local anesthetic agent before making the incision and placing the trocars. An additional 5 mm regular laparoscopic port was placed to assist with retraction and exposure.   The patient was positioned  in reverse Trendelenburg, robot was brought to the surgical field and docked in the standard fashion.  We made sure all the instrumentation was kept indirect view at all times and that there were no collision between the arms. I scrubbed out and went to the console.  I used a robotic arm to retract the liver, the vessel sealer on my right hand and a forced bipolar grasper on my left hand.  There is along the extra 5 mm port allow me ample exposure and the ability to perform meticulous dissection Initial inspection of the abdomen did not reveal any pus. The perforation was contained to the mediastinum. I needed to undo the fundoplication and remove the bioA mesh to access the mediastinum.  I cut the v lock suture and evaluated the esophagus no obvious holes were identified, I asked anesthesia to placed an NG and the nd exited a small hole at the GE junction. I reposition the NG under direct visualization into the Antrum.  The esophageal tear was repair w multiple 2-0 silk sutures. I asked anesthesia  tp fill the stomach w Methylene blue and there was small leak at the corner that we repaired with another 2-0 silk stitch. We again insufflated the stomach and did not visualized any blue extravasation.   I was able to develop a piece  of greater omentum as a flap and used it to reinforce the repair The omentum was secure to the left crus with a couple of silks. I then did a partial fundoplication to reinforce the repair using multiple 2-0 silk sutures. I then placed vistaseal. A 19 Fr mediastinal drain was placed under direct robotic visualization and secured at the skin level.   I identified ligament of treitz and placed a silk suture.  Inspection of the  upper quadrant was performed. No bleeding, bile  Or esophageal injuries leaks, or bowel injuries were noted. Robotic instruments and robotic arms were undocked in the standard fashion. All the needles were removed under direct visualization.   I scrubbed back in. I retrieve the silk suture to mark proximal jejunum, Using a 16 red rubber a witzel jejunostomy tube was created I used a PMI suture to fix the small bowel to the abdominal wall. I recheck the jejunostomy laparoscopically and it looked good w/o any kinks. 0 vicryl used to closed the periumbilical fascia. Skin loosely close w 4-0 monocryl. Jejunostomy secure to the skin as was as mediastinal drain.  Pneumoperitoneum was released.  The periumbilical port site was closed with interrumpted 0 Vicryl sutures. 4-0 subcuticular Monocryl was used to close the skin.  Sterile dressing placed on incisions. .  The patient was then extubated and brought to the recovery room in stable condition. Sponge, lap, and needle counts were correct at closure and at the conclusion of the case.               [name], MD, FACS

## 2022-04-15 NOTE — Anesthesia Procedure Notes (Signed)
Procedure Name: Intubation Date/Time: 04/15/2022 7:39 AM  Performed by: Hilbert Odor, CRNAPre-anesthesia Checklist: Patient identified, Patient being monitored, Timeout performed, Emergency Drugs available and Suction available Patient Re-evaluated:Patient Re-evaluated prior to induction Oxygen Delivery Method: Circle system utilized Preoxygenation: Pre-oxygenation with 100% oxygen Induction Type: IV induction Ventilation: Mask ventilation without difficulty Laryngoscope Size: 3 and McGraph Grade View: Grade I Tube type: Oral Tube size: 7.0 mm Number of attempts: 1 Airway Equipment and Method: Stylet Placement Confirmation: ETT inserted through vocal cords under direct vision, positive ETCO2 and breath sounds checked- equal and bilateral Secured at: 21 cm Tube secured with: Tape Dental Injury: Teeth and Oropharynx as per pre-operative assessment

## 2022-04-15 NOTE — Anesthesia Procedure Notes (Signed)
Procedure Name: Intubation Date/Time: 04/15/2022 6:14 PM  Performed by: Aline Brochure, CRNAPre-anesthesia Checklist: Patient identified, Patient being monitored, Timeout performed, Emergency Drugs available and Suction available Patient Re-evaluated:Patient Re-evaluated prior to induction Oxygen Delivery Method: Circle system utilized Preoxygenation: Pre-oxygenation with 100% oxygen Induction Type: IV induction Ventilation: Mask ventilation without difficulty Laryngoscope Size: 3 and McGraph Grade View: Grade I Tube type: Oral Tube size: 6.5 mm Number of attempts: 1 Airway Equipment and Method: Stylet and Video-laryngoscopy Placement Confirmation: ETT inserted through vocal cords under direct vision, positive ETCO2 and breath sounds checked- equal and bilateral Secured at: 21 cm Tube secured with: Tape Dental Injury: Teeth and Oropharynx as per pre-operative assessment

## 2022-04-15 NOTE — Transfer of Care (Signed)
Immediate Anesthesia Transfer of Care Note  Patient: Kelly Wood  Procedure(s) Performed: XI ROBOTIC ASSISTED PARAESOPHAGEAL HERNIA REPAIR, RNFA to assist (Abdomen)  Patient Location: PACU  Anesthesia Type:General  Level of Consciousness: awake, drowsy, and patient cooperative  Airway & Oxygen Therapy: Patient Spontanous Breathing and Patient connected to nasal cannula oxygen  Post-op Assessment: Report given to RN and Post -op Vital signs reviewed and stable  Post vital signs: Reviewed  Last Vitals:  Vitals Value Taken Time  BP 126/69 04/15/22 1110  Temp 37 C 04/15/22 1110  Pulse 71 04/15/22 1111  Resp 21 04/15/22 1111  SpO2 100 % 04/15/22 1111  Vitals shown include unvalidated device data.  Last Pain:  Vitals:   04/15/22 1110  TempSrc: Tympanic  PainSc: 0-No pain         Complications: No notable events documented.

## 2022-04-16 ENCOUNTER — Encounter: Payer: Self-pay | Admitting: Surgery

## 2022-04-16 ENCOUNTER — Observation Stay: Payer: Medicare HMO

## 2022-04-16 DIAGNOSIS — D72829 Elevated white blood cell count, unspecified: Secondary | ICD-10-CM | POA: Diagnosis present

## 2022-04-16 DIAGNOSIS — D649 Anemia, unspecified: Secondary | ICD-10-CM | POA: Diagnosis present

## 2022-04-16 DIAGNOSIS — J432 Centrilobular emphysema: Secondary | ICD-10-CM | POA: Diagnosis present

## 2022-04-16 DIAGNOSIS — Z8719 Personal history of other diseases of the digestive system: Secondary | ICD-10-CM

## 2022-04-16 DIAGNOSIS — Z9889 Other specified postprocedural states: Secondary | ICD-10-CM | POA: Diagnosis not present

## 2022-04-16 DIAGNOSIS — J95811 Postprocedural pneumothorax: Secondary | ICD-10-CM | POA: Diagnosis present

## 2022-04-16 DIAGNOSIS — E46 Unspecified protein-calorie malnutrition: Secondary | ICD-10-CM | POA: Diagnosis present

## 2022-04-16 DIAGNOSIS — R54 Age-related physical debility: Secondary | ICD-10-CM | POA: Diagnosis present

## 2022-04-16 DIAGNOSIS — K449 Diaphragmatic hernia without obstruction or gangrene: Secondary | ICD-10-CM | POA: Diagnosis present

## 2022-04-16 DIAGNOSIS — J918 Pleural effusion in other conditions classified elsewhere: Secondary | ICD-10-CM | POA: Diagnosis not present

## 2022-04-16 DIAGNOSIS — I3481 Nonrheumatic mitral (valve) annulus calcification: Secondary | ICD-10-CM | POA: Diagnosis present

## 2022-04-16 DIAGNOSIS — E039 Hypothyroidism, unspecified: Secondary | ICD-10-CM | POA: Diagnosis present

## 2022-04-16 DIAGNOSIS — M810 Age-related osteoporosis without current pathological fracture: Secondary | ICD-10-CM | POA: Diagnosis present

## 2022-04-16 DIAGNOSIS — E876 Hypokalemia: Secondary | ICD-10-CM | POA: Diagnosis not present

## 2022-04-16 DIAGNOSIS — K9189 Other postprocedural complications and disorders of digestive system: Secondary | ICD-10-CM | POA: Diagnosis present

## 2022-04-16 DIAGNOSIS — R410 Disorientation, unspecified: Secondary | ICD-10-CM | POA: Diagnosis not present

## 2022-04-16 DIAGNOSIS — I471 Supraventricular tachycardia, unspecified: Secondary | ICD-10-CM | POA: Diagnosis not present

## 2022-04-16 DIAGNOSIS — E8809 Other disorders of plasma-protein metabolism, not elsewhere classified: Secondary | ICD-10-CM | POA: Diagnosis present

## 2022-04-16 DIAGNOSIS — K223 Perforation of esophagus: Secondary | ICD-10-CM | POA: Diagnosis present

## 2022-04-16 DIAGNOSIS — J948 Other specified pleural conditions: Secondary | ICD-10-CM | POA: Diagnosis not present

## 2022-04-16 DIAGNOSIS — I10 Essential (primary) hypertension: Secondary | ICD-10-CM | POA: Diagnosis present

## 2022-04-16 DIAGNOSIS — E871 Hypo-osmolality and hyponatremia: Secondary | ICD-10-CM | POA: Diagnosis not present

## 2022-04-16 DIAGNOSIS — I4719 Other supraventricular tachycardia: Secondary | ICD-10-CM | POA: Diagnosis not present

## 2022-04-16 DIAGNOSIS — I272 Pulmonary hypertension, unspecified: Secondary | ICD-10-CM | POA: Diagnosis present

## 2022-04-16 DIAGNOSIS — I700 Atherosclerosis of aorta: Secondary | ICD-10-CM | POA: Diagnosis present

## 2022-04-16 DIAGNOSIS — E785 Hyperlipidemia, unspecified: Secondary | ICD-10-CM | POA: Diagnosis present

## 2022-04-16 DIAGNOSIS — Y838 Other surgical procedures as the cause of abnormal reaction of the patient, or of later complication, without mention of misadventure at the time of the procedure: Secondary | ICD-10-CM | POA: Diagnosis present

## 2022-04-16 DIAGNOSIS — K219 Gastro-esophageal reflux disease without esophagitis: Secondary | ICD-10-CM | POA: Diagnosis present

## 2022-04-16 DIAGNOSIS — R7401 Elevation of levels of liver transaminase levels: Secondary | ICD-10-CM | POA: Diagnosis present

## 2022-04-16 DIAGNOSIS — I4892 Unspecified atrial flutter: Secondary | ICD-10-CM | POA: Diagnosis not present

## 2022-04-16 LAB — GLUCOSE, CAPILLARY
Glucose-Capillary: 136 mg/dL — ABNORMAL HIGH (ref 70–99)
Glucose-Capillary: 150 mg/dL — ABNORMAL HIGH (ref 70–99)

## 2022-04-16 MED ORDER — FREE WATER
90.0000 mL | Status: DC
  Administered 2022-04-16 – 2022-04-19 (×17): 90 mL

## 2022-04-16 MED ORDER — OXYCODONE HCL 5 MG/5ML PO SOLN
5.0000 mg | ORAL | Status: DC | PRN
  Administered 2022-04-16 – 2022-04-19 (×2): 5 mg via ORAL
  Filled 2022-04-16 (×2): qty 5

## 2022-04-16 MED ORDER — MENTHOL 3 MG MT LOZG
1.0000 | LOZENGE | OROMUCOSAL | Status: DC | PRN
  Administered 2022-04-18: 3 mg via ORAL
  Filled 2022-04-16: qty 9

## 2022-04-16 MED ORDER — PROSOURCE TF20 ENFIT COMPATIBL EN LIQD
60.0000 mL | Freq: Every day | ENTERAL | Status: DC
  Administered 2022-04-16 – 2022-04-17 (×2): 60 mL

## 2022-04-16 MED ORDER — OSMOLITE 1.2 CAL PO LIQD
1000.0000 mL | ORAL | Status: DC
  Administered 2022-04-16: 1000 mL

## 2022-04-16 MED ORDER — ACETAMINOPHEN 10 MG/ML IV SOLN
1000.0000 mg | Freq: Four times a day (QID) | INTRAVENOUS | Status: AC
  Administered 2022-04-16 (×4): 1000 mg via INTRAVENOUS
  Filled 2022-04-16 (×4): qty 100

## 2022-04-16 MED ORDER — PHENOL 1.4 % MT LIQD
1.0000 | OROMUCOSAL | Status: DC | PRN
  Administered 2022-04-17 – 2022-04-28 (×3): 1 via OROMUCOSAL
  Filled 2022-04-16: qty 177

## 2022-04-16 MED ORDER — METOPROLOL TARTRATE 5 MG/5ML IV SOLN
5.0000 mg | Freq: Three times a day (TID) | INTRAVENOUS | Status: DC
  Administered 2022-04-16 – 2022-04-17 (×7): 5 mg via INTRAVENOUS
  Filled 2022-04-16 (×7): qty 5

## 2022-04-16 NOTE — Consult Note (Signed)
[facility] Consultation Progress Note  Patient: Kelly Wood XBM:[id number]   PCP: Idelle Crouch, MD DOB: January 10, 1941   DOA: 04/15/2022   DOS: 04/16/2022   Date of Service: the patient was seen and examined on 04/16/2022 Primary service: Jules Husbands, MD    Brief hospital course: Pt. with PMH of HTN, HLD, hypothyroid, COPD, anemia, GERD; admitted on 04/15/2022, with complaint of chest discomfort and burping s/p repair of paraesophageal hernia robotically 11 months ago.  Patient was seen by general surgery and plan was to redo surgery and developed complication of esophageal rupture.  Currently patient is n.p.o. on NG tube with intermittent suction.  [facility] hospitalist consulted for medical management of hypertension and hypothyroidism as patient will be n.p.o. for at least 5 days and will need IV medications.   Subjective: Patient was seen and examined at bedside, NG tube intact with dark-colored drainage noticed.  Patient was resting comfortably, denied any pain or discomfort.  Assessment and Plan: Principal Problem:   S/P repair of paraesophageal hernia Active Problems:   Esophageal perforation   Esophageal leak, complication s/p laparoscopic paraesophageal hernia repair Continue NG tube with intermittent suction, JP drain intact Continue Zosyn IV Continue IV fluid for hydration Patient is n.p.o. Further management as primary team General surgery   Hypertension Currently blood pressure is well-controlled, we will continue to monitor Continue Lopressor 5 mg IV every 8 hourly scheduled   Hypothyroidism, hold Synthroid for now No need to start IV Synthroid for 5 days, if patient is unable to take oral medication after 5 days then consider starting IV Synthroid, discussed with pharmacy.    Diet: NPO due to esophageal leak DVT Prophylaxis: Subcutaneous Lovenox  Family Communication: family was  not present at bedside, at the time of interview. The pt provided permission  to discuss medical plan with the family. Opportunity was given to ask question and all questions were answered satisfactorily.    Disposition: We will continue to follow the patient.   Recommendation on discharge: As per primary team  Other Consultants: none Procedures: s/p laparoscopic paraesophageal hernia repair  Antibiotics: Anti-infectives (From admission, onward)    Start     Dose/Rate Route Frequency Ordered Stop   04/15/22 1730  piperacillin-tazobactam (ZOSYN) IVPB 3.375 g  Status:  Discontinued        3.375 g 100 mL/hr over 30 Minutes Intravenous Every 8 hours 04/15/22 1716 04/15/22 1719   04/15/22 1730  piperacillin-tazobactam (ZOSYN) IVPB 3.375 g        3.375 g 12.5 mL/hr over 240 Minutes Intravenous Every 8 hours 04/15/22 1719     04/15/22 1730  piperacillin-tazobactam (ZOSYN) 3.375 GM/50ML IVPB       Note to Pharmacy: Rutherford Nail E: cabinet override      04/15/22 1730 04/16/22 0544   04/15/22 1445  ceFAZolin (ANCEF) IVPB 2g/100 mL premix  Status:  Discontinued        2 g 200 mL/hr over 30 Minutes Intravenous Every 8 hours 04/15/22 1435 04/15/22 1719   04/15/22 0645  ceFAZolin (ANCEF) IVPB 2g/100 mL premix        2 g 200 mL/hr over 30 Minutes Intravenous On call to O.R. 04/15/22 0632 04/15/22 0806       Objective: Physical Exam: Vitals:   04/16/22 0033 04/16/22 0143 04/16/22 0419 04/16/22 0823  BP: 116/64 109/62 110/65 106/67  Pulse: 72 71 87 85  Resp: '17 14 14 16  '$ Temp: 97.9 F (36.6 C) 97.8 F (36.6 C) 97.8  F (36.6 C) (!) 97.5 F (36.4 C)  TempSrc: Oral Oral Oral Oral  SpO2: 98% 100% 98% 100%  Weight:      Height:        Intake/Output Summary (Last 24 hours) at 04/16/2022 1630 Last data filed at 04/16/2022 1414 Gross per 24 hour  Intake 2071.25 ml  Output 668 ml  Net 1403.25 ml   Filed Weights   04/15/22 0628  Weight: 52.6 kg   General: alert and oriented to time, place, and person. Appear in no distress, affect appropriate Eyes:  PERRLA, Conjunctiva normal ENT: Oral Mucosa Clear, moist  Neck: no JVD, no Abnormal Mass Or lumps Cardiovascular: S1 and S2 Present, no Murmur, peripheral pulses symmetrical Respiratory: normal respiratory effort, Bilateral Air entry equal and Decreased, no use of accessory muscle, Clear to Auscultation, no Crackles, no wheezes Abdomen: Bowel Sound present, Soft and mild epigastric tenderness, no hernia Skin: no rashes  Extremities: no Pedal edema, no calf tenderness Neurologic: without any new focal findings Gait not checked due to patient safety concerns  Data Reviewed: CBC: Recent Labs  Lab 04/15/22 1644  WBC 11.4*  HGB 11.4*  HCT 36.2  MCV 86.4  PLT 009   Basic Metabolic Panel: Recent Labs  Lab 04/15/22 1644  CREATININE 0.66   Liver Function Tests: No results for input(s): "AST", "ALT", "ALKPHOS", "BILITOT", "PROT", "ALBUMIN" in the last 168 hours. No results for input(s): "LIPASE", "AMYLASE" in the last 168 hours. No results for input(s): "AMMONIA" in the last 168 hours. Cardiac Enzymes: No results for input(s): "CKTOTAL", "CKMB", "CKMBINDEX", "TROPONINI" in the last 168 hours. BNP (last 3 results) No results for input(s): "BNP" in the last 8760 hours. CBG: No results for input(s): "GLUCAP" in the last 168 hours. No results found for this or any previous visit (from the past 240 hour(s)).  Studies: DG Chest Port 1 View  Result Date: 04/16/2022 CLINICAL DATA:  Pneumothorax EXAM: PORTABLE CHEST 1 VIEW COMPARISON:  04/15/2022. FINDINGS: Diffuse pulmonary interstitial and alveolar opacities consistent with pulmonary edema. Right-sided pleural effusion. Left base consolidation. Right sided chest tube in place. No pneumothorax identified. There is subcutaneous emphysema bilaterally. Aorta is calcified. NG tube tip extends below the diaphragm and off x-ray. IMPRESSION: Interstitial and alveolar opacity consistent with pulmonary edema. Right-sided pleural effusion. Left base  consolidation. Electronically Signed   By: Sammie Bench M.D.   On: 04/16/2022 08:49   X-ray chest PA or AP  Result Date: 04/15/2022 CLINICAL DATA:  Chest tube esophageal perforation EXAM: CHEST  1 VIEW COMPARISON:  CT 04/15/2022, chest x-ray 10/17/2020 FINDINGS: Interval placement of right-sided chest tube with tip at the medial mid right chest. Esophageal tube tip below the diaphragm but incompletely visualized. Large volume subcutaneous emphysema in the right chest wall. Small right-sided pleural effusion. No appreciable pneumothorax on this exam. Cardiomegaly with bilateral lung base left greater than right airspace disease. Probable soft tissue emphysema in the left breast/chest wall. IMPRESSION: 1. Interval placement of right-sided chest tube with tip at the medial mid right chest. No appreciable pneumothorax on this exam. Large volume subcutaneous emphysema in the right chest wall. Probable emphysema in the left breast or chest wall soft tissues. 2. Small pleural effusions 3. Cardiomegaly with vascular congestion and left greater than right basilar airspace disease. Electronically Signed   By: Donavan Foil M.D.   On: 04/15/2022 23:17   CT CHEST WO CONTRAST  Result Date: 04/15/2022 CLINICAL DATA:  Possible esophageal perforation. History of Nissen. Recent surgery. EXAM:  CT CHEST WITHOUT CONTRAST TECHNIQUE: Multidetector CT imaging of the chest was performed following the standard protocol without IV contrast. RADIATION DOSE REDUCTION: This exam was performed according to the departmental dose-optimization program which includes automated exposure control, adjustment of the mA and/or kV according to patient size and/or use of iterative reconstruction technique. COMPARISON:  Esophagram 10/27/2021. CT abdomen and pelvis 09/29/2021. FINDINGS: Cardiovascular: Heart is mildly enlarged. There is no pericardial effusion. Aorta is normal in size. There are atherosclerotic calcifications of the aorta and  coronary arteries. Mediastinum/Nodes: Oral contrast is seen throughout the esophagus. 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 There is evidence for extravasation of oral contrast along the left side of the Nissen image 2/97 and minimally on the right side near the superior aspect of the Nissen image 2/80. There is no extravasation of oral contrast within the upper abdomen. The visualized thyroid gland is within normal limits. No enlarged lymph nodes are seen. Lungs/Pleura: There is a moderate-sized right pneumothorax (proximally 30%). There is no mediastinal shift. There are small bilateral pleural effusions with bilateral lower lobe atelectasis/airspace disease. The trachea and central airways are patent. 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 Upper Abdomen: There is a small amount of free air in the upper abdomen compatible with recent surgery. Musculoskeletal: There is bilateral chest wall emphysema. No acute fractures are identified. IMPRESSION: 1. Status post Nissen fundoplication. There is evidence for extravasation of oral contrast along the left side of the fundoplication and minimally on the right side near the superior aspect of the Nissen compatible with bowel leak. 2. Moderate-sized right pneumothorax. No mediastinal shift. 3. Small bilateral pleural effusions with bilateral lower lobe atelectasis/airspace disease. 4. Small amount of free air in the upper abdomen compatible with recent surgery. 5. Bilateral chest wall emphysema. Aortic Atherosclerosis (ICD10-I70.0). These results were called by telephone at the time of interpretation on 04/15/2022 at 5:21 pm to provider DIEGO PABON , who verbally acknowledged these results. Electronically Signed   By: Ronney Asters M.D.   On: 04/15/2022 17:22     Scheduled Meds:  enoxaparin (LOVENOX) injection  40 mg Subcutaneous Q24H   feeding supplement (PROSource TF20)  60 mL  Per Tube Daily   free water  90 mL Per Tube Q4H   ketorolac  15 mg Intravenous Q6H   metoprolol tartrate  5 mg Intravenous Q8H   Continuous Infusions:  acetaminophen 1,000 mg (04/16/22 1414)   dextrose 5% lactated ringers 50 mL/hr at 04/16/22 1111   feeding supplement (OSMOLITE 1.2 CAL) 1,000 mL (04/16/22 1106)   piperacillin-tazobactam (ZOSYN)  IV 3.375 g (04/16/22 1517)   PRN Meds: albuterol, [DISCONTINUED] diphenhydrAMINE **OR** diphenhydrAMINE, hydrALAZINE, menthol-cetylpyridinium, morphine injection, naphazoline-glycerin, oxyCODONE, phenol  Time spent: 55 minutes  Author: Val Riles  [facility] 04/16/2022 4:30 PM  To reach On-call, see care teams to locate the attending and reach out to them via [email or web address]. If 7PM-7AM, please contact night-coverage If you still have difficulty reaching the attending provider, please page the [facility] (Director on Call) for [facility] on amion for assistance.

## 2022-04-16 NOTE — Progress Notes (Signed)
Patient transported to 208 with telem. Vitals stable, afebrile. No c/o's discomfort. All tubes patent.  Husband at bedside. Report given to [facility]

## 2022-04-16 NOTE — Progress Notes (Signed)
Initial Nutrition Assessment  DOCUMENTATION CODES:   Not applicable  INTERVENTION:   -TF via j-tube:   Initiate Osmolite 1.2 @ 20 ml/hr and increase by 10 ml every 4 hours to goal rate of 60 ml/hr.   60 ml Prosource TF daily  90 ml free water flush every 4 hours  Tube feeding regimen provides 1808 kcal (100% of needs), 100 grams of protein, and 1181 ml of H2O. Total free water: 1721 ml daily   NUTRITION DIAGNOSIS:   Increased nutrient needs related to post-op healing as evidenced by estimated needs.  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  1/11- s/p rt chest tube placement, robotic assisted primary repair to esophageal perforation, robotic assisted jejunostomy tube, robotic mediastinal blake drain placement, removal of BioA mesh, and omental flap  Reviewed I/O's: +2.7 L x 24 hours   UOP: 625 ml x 24 hours  NGT output: 110 ml x 24 hours  Chest tube output: 140 ml x 24 hours  Case discussed with Dr. Dahlia Byes (general surgery) and RN. Plan to start TF via j-tube today.   Spoke with pt at bedside, who was pleasant and in good spirits today. She reports that her pain has been relieved with medications. PTA pt reports having a good appetite. She typically consumes 3 meals per day (Breakfast: egg, toast, and coffee, or cereal; Lunch: half of a PBJ sandwich; Dinner: meat, starch, and vegetable). Pt reports she has trying to eat more healthfully, as her husband just received an air fryer as a Christmas gift.   Pt reports that her UBW is around 130#. She estimates she lost about 15# over the past year after having a similar surgery last January. She explains that she on a liquid diet for 6-8 weeks post-op  and lost the weight due to the restricted diet and was unable to regain lost weight. Reviewed wt hx; pt has experienced a 1.3% wt loss over the past month, which is not significant for time frame.   Discussed how pt will receive nutrition at this time (via feeding tube). Pt eager to start TF so "I can get stronger". RD provided emotional support to pt allowed her to share stories as well as her determination to recover from surgery.   Medications reviewed and include toraldol and dextrose 5% in lactated ringers infusion @ 50 ml/hr.   Labs reviewed.   NUTRITION - FOCUSED PHYSICAL EXAM:  Flowsheet Row Most Recent Value  Orbital Region No depletion  Upper Arm Region Moderate depletion  Thoracic and Lumbar Region No depletion  Buccal Region No depletion  Temple Region Mild depletion  Clavicle Bone Region No depletion  Clavicle and Acromion Bone Region No depletion  Scapular Bone Region Mild depletion  Dorsal Hand Mild depletion  Patellar Region No depletion  Anterior Thigh Region No depletion  Posterior Calf Region No depletion  Edema (RD Assessment) Mild  Hair Reviewed  Eyes Reviewed  Mouth Reviewed  Skin Reviewed  Nails Reviewed       Diet Order:   Diet Order             Diet NPO time specified Except for: Other (See Comments)  Diet effective now  EDUCATION NEEDS:   Education needs have been addressed  Skin:  Skin Assessment: Skin Integrity Issues: Skin Integrity Issues:: Incisions Incisions: closed abdomen, s/p j-tube placement  Last BM:  Unknown  Height:   Ht Readings from Last 1 Encounters:  04/15/22 5' (1.524 m)    Weight:   Wt Readings from Last 1 Encounters:  04/15/22 52.6 kg    Ideal Body Weight:  45.5 kg  BMI:  Body mass index is 22.65 kg/m.  Estimated Nutritional Needs:   Kcal:  1650-1850  Protein:  85-100 grams  Fluid:  > 1.6 L    Kelly Wood, RD, LDN, [address] Registered Dietitian II Certified Diabetes Care and  Education Specialist Please refer to [facility] for RD and/or RD on-call/weekend/after hours pager

## 2022-04-16 NOTE — Progress Notes (Signed)
Pt arrived to unit via bed, R chest tube in place to suction at 20, foley to gravity, clear yellow urine, L nare NGT to LIWS, abdominal incisions w/ honeycomb dx C/D/I, JP drain to bulb suction, o2 x 2L via [place], husband at bedside, will check orders and monitor closely

## 2022-04-16 NOTE — Progress Notes (Signed)
Patient more awake, asking appropriate questions. Husband at bedside.  Speech clear, denies shortness of breath, chest discomfort. Afebrile

## 2022-04-16 NOTE — Progress Notes (Addendum)
[facility] Day(s): 0.   Post op day(s): 1 Day Post-Op.   Interval History:  Patient seen and examined No acute events or new complaints overnight.  Patient reports she is feeling okay given the circumstances Biggest complaint is sore throat No fever, chills, nausea, emesis Minimal incisional soreness NGT in place; output 110 ccs Drain present; mediastinal; 3 ccs recorded; output serosanguinous  Chest tube present; right; 140 ccs out; serosanguinous; no air leak NPO Does have feeding jejunostomy  Vital signs in last 24 hours: [min-max] current  Temp:  [96.8 F (36 C)-97.9 F (36.6 C)] 97.8 F (36.6 C) (01/12 0419) Pulse Rate:  [62-87] 87 (01/12 0419) Resp:  [9-20] 14 (01/12 0419) BP: (107-133)/(59-94) 110/65 (01/12 0419) SpO2:  [90 %-100 %] 98 % (01/12 0419)     Height: 5' (152.4 cm) Weight: 52.6 kg BMI (Calculated): 22.65   Intake/Output last 2 shifts:  01/11 0701 - 01/12 0700 In: 3571.3 [I.V.:3121.3; IV Piggyback:450] Out: 952 [Urine:625; Emesis/NG output:110; Drains:3; Blood:35; Chest Tube:140]   Physical Exam:  Constitutional: alert, cooperative and no distress  HEENT: NGT in place Chest: Right sided chest tube; site CDI; minimal serosanguinous output; no air leak  Respiratory: breathing non-labored at rest; on [place] Cardi[facility]: regular rate and sinus rhythm  Gastrointestinal: soft, non-tender, and non-distended, no rebound/guarding. Mediastinal drain; minimal serosanguinous output Genitourinary: Foley in place Integumentary: Laparoscopic incisions are CDI; no erythema  Labs:     Latest Ref Rng & Units 04/15/2022    4:44 PM 04/08/2021    4:57 AM 04/07/2021    5:20 PM  CBC  WBC 4.0 - 10.5 K/uL 11.4  14.8  13.6   Hemoglobin 12.0 - 15.0 g/dL 11.4  11.5  11.8   Hematocrit 36.0 - 46.0 % 36.2  35.3  36.7   Platelets 150 - 400 K/uL 283  234  206       Latest Ref Rng & Units 04/15/2022    4:44 PM 04/08/2021    4:57 AM  04/07/2021    5:20 PM  CMP  Glucose 70 - 99 mg/dL  141    BUN 8 - 23 mg/dL  16    Creatinine 0.44 - 1.00 mg/dL 0.66  0.54  0.69   Sodium 135 - 145 mmol/L  136    Potassium 3.5 - 5.1 mmol/L  4.0    Chloride 98 - 111 mmol/L  102    CO2 22 - 32 mmol/L  24    Calcium 8.9 - 10.3 mg/dL  8.6      Imaging studies: No new pertinent imaging studies   Assessment/Plan:  82 y.o. female 1 Day Post-Op s/p robotic assisted laparoscopic paraesophageal hernia repair complicated by esophageal leak requiring immediate take back for robotic assisted primary repair of esophageal perforation, placement of mediastinal drain, feeding jejunostomy placement, and right chest tube placements.    - Will need to be strict NPO x5 days; add Cepacol spray for discomfort, okay for mouth swabs, hard candies, gum  - Okay to start enteric feeding via jejunostomy today; liquids only; NO PILLS via jejunostomy - Continue NGT decompression; LIS; monitor and record output - Continue foley catheter for now; can potentially remove tomorrow if doing well  - Continue Blake drain in mediastinum; monitor and record output - Continue chest tube; monitor and record output   - Continue IV Abx (Zosyn)   - Monitor abdominal examination - Pain control prn; antiemetics prn - Can mobilize with therapies; engage  tomorrow  - Will ask medicine for assistance with chronic medical conditions and medication management    All of the above findings and recommendations were discussed with the patient, patient's family, and the medical team, and all of patient's and family's questions were answered to their expressed satisfaction.  -- Edison Simon, PA-C [facility] 04/16/2022, 7:18 AM M-F: 7am - 4pm

## 2022-04-17 ENCOUNTER — Encounter: Payer: Self-pay | Admitting: Surgery

## 2022-04-17 DIAGNOSIS — E876 Hypokalemia: Secondary | ICD-10-CM

## 2022-04-17 DIAGNOSIS — Z8719 Personal history of other diseases of the digestive system: Secondary | ICD-10-CM | POA: Diagnosis not present

## 2022-04-17 DIAGNOSIS — Z9889 Other specified postprocedural states: Secondary | ICD-10-CM | POA: Diagnosis not present

## 2022-04-17 LAB — COMPREHENSIVE METABOLIC PANEL
ALT: 87 U/L — ABNORMAL HIGH (ref 0–44)
AST: 110 U/L — ABNORMAL HIGH (ref 15–41)
Albumin: 2.3 g/dL — ABNORMAL LOW (ref 3.5–5.0)
Alkaline Phosphatase: 63 U/L (ref 38–126)
Anion gap: 7 (ref 5–15)
BUN: 21 mg/dL (ref 8–23)
CO2: 24 mmol/L (ref 22–32)
Calcium: 7.5 mg/dL — ABNORMAL LOW (ref 8.9–10.3)
Chloride: 103 mmol/L (ref 98–111)
Creatinine, Ser: 0.73 mg/dL (ref 0.44–1.00)
GFR, Estimated: >60 mL/min (ref >60)
Glucose, Bld: 131 mg/dL — ABNORMAL HIGH (ref 70–99)
Potassium: 3.3 mmol/L — ABNORMAL LOW (ref 3.5–5.1)
Sodium: 134 mmol/L — ABNORMAL LOW (ref 135–145)
Total Bilirubin: 0.2 mg/dL — ABNORMAL LOW (ref 0.3–1.2)
Total Protein: 5.3 g/dL — ABNORMAL LOW (ref 6.5–8.1)

## 2022-04-17 LAB — CBC
HCT: 31.2 % — ABNORMAL LOW (ref 36.0–46.0)
Hemoglobin: 9.8 g/dL — ABNORMAL LOW (ref 12.0–15.0)
MCH: 27.2 pg (ref 26.0–34.0)
MCHC: 31.4 g/dL (ref 30.0–36.0)
MCV: 86.7 fL (ref 80.0–100.0)
Platelets: 254 10*3/uL (ref 150–400)
RBC: 3.6 MIL/uL — ABNORMAL LOW (ref 3.87–5.11)
RDW: 14.1 % (ref 11.5–15.5)
WBC: 15.3 10*3/uL — ABNORMAL HIGH (ref 4.0–10.5)
nRBC: 0 % (ref 0.0–0.2)

## 2022-04-17 LAB — GLUCOSE, CAPILLARY
Glucose-Capillary: 142 mg/dL — ABNORMAL HIGH (ref 70–99)
Glucose-Capillary: 141 mg/dL — ABNORMAL HIGH (ref 70–99)
Glucose-Capillary: 136 mg/dL — ABNORMAL HIGH (ref 70–99)
Glucose-Capillary: 138 mg/dL — ABNORMAL HIGH (ref 70–99)
Glucose-Capillary: 139 mg/dL — ABNORMAL HIGH (ref 70–99)

## 2022-04-17 LAB — MAGNESIUM: Magnesium: 1.8 mg/dL (ref 1.7–2.4)

## 2022-04-17 LAB — PHOSPHORUS: Phosphorus: 2.9 mg/dL (ref 2.5–4.6)

## 2022-04-17 MED ORDER — ACETAMINOPHEN 160 MG/5ML PO SOLN
650.0000 mg | Freq: Four times a day (QID) | ORAL | Status: DC | PRN
  Administered 2022-04-17 (×2): 650 mg
  Filled 2022-04-17 (×3): qty 20.3

## 2022-04-17 MED ORDER — CHLORHEXIDINE GLUCONATE CLOTH 2 % EX PADS
6.0000 | MEDICATED_PAD | Freq: Every day | CUTANEOUS | Status: DC
  Administered 2022-04-17 – 2022-04-28 (×11): 6 via TOPICAL

## 2022-04-17 MED ORDER — POTASSIUM CHLORIDE 20 MEQ PO PACK
40.0000 meq | PACK | Freq: Once | ORAL | Status: AC
  Administered 2022-04-17: 40 meq
  Filled 2022-04-17: qty 2

## 2022-04-17 MED ORDER — METOPROLOL TARTRATE 5 MG/5ML IV SOLN
5.0000 mg | Freq: Four times a day (QID) | INTRAVENOUS | Status: DC
  Administered 2022-04-18 – 2022-04-22 (×17): 5 mg via INTRAVENOUS
  Filled 2022-04-17 (×16): qty 5

## 2022-04-17 MED ORDER — PANTOPRAZOLE SODIUM 40 MG IV SOLR
40.0000 mg | Freq: Two times a day (BID) | INTRAVENOUS | Status: DC
  Administered 2022-04-17 – 2022-04-23 (×13): 40 mg via INTRAVENOUS
  Filled 2022-04-17 (×13): qty 10

## 2022-04-17 MED ORDER — METOPROLOL TARTRATE 5 MG/5ML IV SOLN
5.0000 mg | Freq: Once | INTRAVENOUS | Status: AC
  Administered 2022-04-17: 5 mg via INTRAVENOUS
  Filled 2022-04-17: qty 5

## 2022-04-17 NOTE — Progress Notes (Signed)
[facility] Day(s): 1.   Post op day(s): 2 Days Post-Op.   Interval History:  Patient seen and examined No acute events or new complaints overnight.  Patient reports she is feeling okay given the circumstances Biggest complaint is sore throat, chewing gum, no lozenges or spray yet at bedside No fever, chills, nausea, emesis Minimal incisional soreness NGT in place; output 115 ccs Drain present; mediastinal; 30 ccs recorded; output serosanguinous  Chest tube present; right; 140 ccs out; serosanguinous; no air leak NPO C/o pain last night with attempt at advancing TFs toward goal, explained and strongly suspect this had little to do with any adjustment in the tube feeding rate as I believe that would be imperceptible.  She and her husband understand and we will resume increasing tube feeds gradually toward goal. Leukocytosis, suspect postoperative reaction.  Will follow She is reluctant to have Foley removed at present, encouraged her to allow nurse to remove with increased mobilization.  Vital signs in last 24 hours: [min-max] current  Temp:  [98.2 F (36.8 C)-98.7 F (37.1 C)] 98.7 F (37.1 C) (01/13 0804) Pulse Rate:  [85-92] 92 (01/13 0804) Resp:  [16-20] 16 (01/13 0330) BP: (98-127)/(52-78) 127/69 (01/13 0804) SpO2:  [93 %-100 %] 94 % (01/13 0804) Weight:  [62.9 kg] 62.9 kg (01/13 0427)     Height: 5' (152.4 cm) Weight: 62.9 kg BMI (Calculated): 27.08   Intake/Output last 2 shifts:  01/12 0701 - 01/13 0700 In: 3187.3 [I.V.:1431.3; NG/GT:986; IV Piggyback:200] Out: 1095 [Urine:650; Emesis/NG output:300; Drains:30; Chest Tube:115]   Physical Exam:  Constitutional: alert, cooperative and no distress  HEENT: NGT in place, checked to [facility] sump patency. Chest: Right sided chest tube; site CDI; minimal serosanguinous output; no air leak  Respiratory: breathing non-labored at rest; on [place] Cardiovascular: regular rate and sinus  rhythm  Gastrointestinal: soft, non-tender, and non-distended, no rebound/guarding. Mediastinal drain; minimal serosanguinous output Genitourinary: Foley in place Integumentary: Laparoscopic incisions are CDI; no erythema  Labs:     Latest Ref Rng & Units 04/17/2022    6:32 AM 04/15/2022    4:44 PM 04/08/2021    4:57 AM  CBC  WBC 4.0 - 10.5 K/uL 15.3  11.4  14.8   Hemoglobin 12.0 - 15.0 g/dL 9.8  11.4  11.5   Hematocrit 36.0 - 46.0 % 31.2  36.2  35.3   Platelets 150 - 400 K/uL 254  283  234       Latest Ref Rng & Units 04/17/2022    6:32 AM 04/15/2022    4:44 PM 04/08/2021    4:57 AM  CMP  Glucose 70 - 99 mg/dL 131   141   BUN 8 - 23 mg/dL 21   16   Creatinine 0.44 - 1.00 mg/dL 0.73  0.66  0.54   Sodium 135 - 145 mmol/L 134   136   Potassium 3.5 - 5.1 mmol/L 3.3   4.0   Chloride 98 - 111 mmol/L 103   102   CO2 22 - 32 mmol/L 24   24   Calcium 8.9 - 10.3 mg/dL 7.5   8.6   Total Protein 6.5 - 8.1 g/dL 5.3     Total Bilirubin 0.3 - 1.2 mg/dL 0.2     Alkaline Phos 38 - 126 U/L 63     AST 15 - 41 U/L 110     ALT 0 - 44 U/L 87       Imaging studies: No  new pertinent imaging studies   Assessment/Plan:  82 y.o. female 2 Days Post-Op s/p robotic assisted laparoscopic paraesophageal hernia repair complicated by esophageal leak requiring immediate take back for robotic assisted primary repair of esophageal perforation, placement of mediastinal drain, feeding jejunostomy placement, and right chest tube placements.    - Will need to be strict NPO x5 total days; add Cepacol spray for discomfort, okay for mouth swabs, hard candies, gum  - Repeat swallow study Next week Wednesday   - Okay to advance enteric feeding via jejunostomy today, resuming scheduled toward goal; liquids only; NO PILLS via jejunostomy - Continue NGT decompression; LIS; monitor and record output - DC foley later today as willing, or tomorrow. - Continue Blake drain in mediastinum; monitor and record output - Continue  chest tube; monitor and record output   - Continue IV Abx (Zosyn)   - Monitor abdominal examination - Pain control prn; antiemetics prn - Can mobilize with therapies; engage daily -Appreciate medicine for assistance with chronic medical conditions and medication management: Rx thyroid and dysrrythmia   All of the above findings and recommendations were discussed with the patient, patient's family, and the medical team, and all of patient's and family's questions were answered to their expressed satisfaction.  Ronny Bacon, M.D., [facility]  04/17/2022 ; 11:19 AM

## 2022-04-17 NOTE — Evaluation (Signed)
Physical Therapy Evaluation Patient Details Name: Kelly Wood MRN: [id number] DOB: 1940-07-08 Today's Date: 04/17/2022  History of Present Illness  Pt admitted to [facility] on 04/15/22 with c/o persistent bloating and eructation with chest discomfort. S/p repair of paraesophageal hernia robotically 39moago. S/p robotic assisted laparoscopic paraesophageal hernia repair complicated by esophageal leak requiring immediate take back for robotic assisted primary repair of esophageal perforation, placement of mediastinal drain, feeding jejunostomy placement, and right chest tube placements.   Clinical Impression  Pt is a 82year old F admitted to hospital on 04/15/21 for repair of paraesophageal hernia. At baseline, pt was Ind with ADL's, IADL's, ambulation without AD, medication management, and driving.   Pt presents with functional weakness, increased pain levels, increased O2 dependence from baseline (on 1.5L today), decreased gross balance, and decreased activity tolerance resulting in impaired functional mobility from baseline. Due to deficits, pt required min assist for bed mobility, min assist for transfers without AD and L HHA, and min assist to take [facility] and no AD. Deficits limit the pt's ability to safely and independently perform ADL's, transfer, and ambulate. Pt will benefit from acute skilled PT services to address deficits for return to baseline function. Recommending OT consult at this time, as current level of functional mobility would likely limit safety and Ind with ADL's and IADL's.  Pt expected to make progress towards deficits with continued participation with therapy while hospitalized. Therefore, PT recommends DC home with HHPT services and initial frequent/constant supervision/assist from spouse with functional mobility and ADL's. No DME recommendations at this time, but will recommend use of RW, BSC, and shower chair at DC. Pt and spouse agreeable to PT  recommendations.        Recommendations for follow up therapy are one component of a multi-disciplinary discharge planning process, led by the attending physician.  Recommendations may be updated based on patient status, additional functional criteria and insurance authorization.  Follow Up Recommendations Home health PT      Assistance Recommended at Discharge Intermittent Supervision/Assistance  Patient can return home with the following  A little help with walking and/or transfers;A little help with bathing/dressing/bathroom;Assistance with cooking/housework;Assist for transportation;Help with stairs or ramp for entrance    Equipment Recommendations None recommended by PT (has all neccessary equipment; recommend use of RW, BSC, and shower chair at home)  Recommendations for Other Services  OT consult    Functional Status Assessment Patient has had a recent decline in their functional status and demonstrates the ability to make significant improvements in function in a reasonable and predictable amount of time.     Precautions / Restrictions Precautions Precaution Comments: abdominal (log roll, splinting), chest tube, JP drain, NG tube, G tube      Mobility  Bed Mobility Overal bed mobility: Needs Assistance Bed Mobility: Supine to Sit, Sit to Supine, Rolling Rolling: Min assist   Supine to sit: Min assist, HOB elevated Sit to supine: Min assist   General bed mobility comments: grossly min assist for perform log roll to enter/exit bed; step by step cues for safety and sequencing. increased time/effort and reliance on handrails to facilitate mobility    Transfers Overall transfer level: Needs assistance Equipment used: 1 person hand held assist Transfers: Sit to/from Stand Sit to Stand: Min assist           General transfer comment: for power to stand from EOB without AD, L HHA    Ambulation/Gait Ambulation/Gait assistance: Min  assist Gait Distance (Feet): 1  Feet (2 lateral steps towards [facility]) Assistive device: 1 person hand held assist         General Gait Details: Min assist for balance/gait to take 2 lateral steps towards HOB. Demonstrates narrow BOS, slowed cadence, and decreased step length/foot clearance bil.    Balance Overall balance assessment: Needs assistance Sitting-balance support: Bilateral upper extremity supported, Feet supported Sitting balance-Leahy Scale: Fair       Standing balance-Leahy Scale: Poor Standing balance comment: min assist for standing balance with L HHA, no AD                             Pertinent Vitals/Pain Pain Assessment Pain Assessment: 0-10 Pain Score: 6  Pain Location: abdominal/incisional Pain Descriptors / Indicators: Sharp Pain Intervention(s): Monitored during session, Limited activity within patient's tolerance, Premedicated before session, Repositioned    Home Living Family/patient expects to be discharged to:: Private residence Living Arrangements: Spouse/significant other Available Help at Discharge: Family;Available 24 hours/day Type of Home: House Home Access: Stairs to enter Entrance Stairs-Rails: Right Entrance Stairs-Number of Steps: 2 Alternate Level Stairs-Number of Steps: no essential needs on upper level of home Home Layout: Two level;Able to live on main level with bedroom/bathroom Home Equipment: Rolling Walker (2 wheels);Rollator (4 wheels);Cane - single point;BSC/3in1;Shower seat;Tub bench      Prior Function Prior Level of Function : Independent/Modified Independent;Driving             Mobility Comments: Ambulation without AD; no hx of falls ADLs Comments: Ind ADL's, splits IADL's with spouse, Ind meds     Hand Dominance   Dominant Hand: Right    Extremity/Trunk Assessment   Upper Extremity Assessment Upper Extremity Assessment: Overall WFL for tasks assessed (not formally assessed due to acuity of abdominal surgery; observed to be at least  3+/5 as pt was able to WB through UE for bed mobility/transfers without buckling)    Lower Extremity Assessment Lower Extremity Assessment: Overall WFL for tasks assessed (not formally assessed due to acuity of abdominal surgery; observed to be at least 3+/5 as pt was able to WB through LE for transfers/gait without buckling; mild LE weakness as she required min assist to stand)    Cervical / Trunk Assessment Cervical / Trunk Assessment: Normal  Communication   Communication: No difficulties  Cognition Arousal/Alertness: Awake/alert Behavior During Therapy: WFL for tasks assessed/performed Overall Cognitive Status: Within Functional Limits for tasks assessed                                 General Comments: A&O x4; able to follow 100% of 3-step commands        General Comments General comments (skin integrity, edema, etc.): c/o increased discomfort from NG tube; chest tube and JP drain dressings dry and intact.    Exercises Other Exercises Other Exercises: Participates in bed mobility, transfers, and minimal gait with overall min assist. Increased time/effort due to pain, education, and management of lines/leads/tubes. Other Exercises: Pt and spouse educated re: PT role/POC, DC recommendations, safety with functional mobility, benefits of functional mobility for pain/gas management, splinting for pain management, log roll. They verbalized understanding.   Assessment/Plan    PT Assessment Patient needs continued PT services  PT Problem List Decreased strength;Decreased activity tolerance;Decreased balance;Pain       PT Treatment Interventions DME instruction;Gait training;Stair training;Functional mobility training;Therapeutic activities;Therapeutic  exercise;Balance training;Neuromuscular re-education;Patient/family education    PT Goals (Current goals can be found in the Care Plan section)  Acute Rehab PT Goals Patient Stated Goal: "walking and exercise" PT Goal  Formulation: With patient/family Time For Goal Achievement: 05/01/22 Potential to Achieve Goals: Good    Frequency Min 2X/week        AM-PAC PT "6 Clicks" Mobility  Outcome Measure Help needed turning from your back to your side while in a flat bed without using bedrails?: A Little Help needed moving from lying on your back to sitting on the side of a flat bed without using bedrails?: A Little Help needed moving to and from a bed to a chair (including a wheelchair)?: A Little Help needed standing up from a chair using your arms (e.g., wheelchair or bedside chair)?: A Little Help needed to walk in hospital room?: A Little Help needed climbing 3-5 steps with a railing? : A Lot 6 Click Score: 17    End of Session   Activity Tolerance: Patient tolerated treatment well;Patient limited by pain Patient left: in bed;with call bell/phone within reach;with bed alarm set;with family/visitor present Nurse Communication: Mobility status PT Visit Diagnosis: Unsteadiness on feet (R26.81);Muscle weakness (generalized) (M62.81);Difficulty in walking, not elsewhere classified (R26.2);Pain Pain - Right/Left:  (abdominal)    Time: 5520-8022 PT Time Calculation (min) (ACUTE ONLY): 44 min   Charges:   PT Evaluation $PT Eval Low Complexity: 1 Low PT Treatments $Therapeutic Activity: 23-37 mins        Herminio Commons, PT, DPT 11:20 AM,04/17/22 Physical Therapist - [facility]

## 2022-04-17 NOTE — Progress Notes (Addendum)
PROGRESS NOTE   Kelly Wood  NWG:[id number]    DOB: 1940-11-25    DOA: 04/15/2022  PCP: Idelle Crouch, MD   I have briefly reviewed patients previous medical records in [facility].  No chief complaint on file.   Brief Narrative:  82 year old married female, PMH of HTN, HLD, hypothyroid, COPD, anemia, GERD, s/p repair of paraesophageal hernia robotically approximately 11 months ago, presented to ED on 04/15/2022 with persistent bloating, eructation and chest discomfort.  She was diagnosed with symptomatic recurrent paraesophageal hernia.  Admitted by the surgical team.  She underwent robotic assisted laparoscopic repair of paraesophageal hernia with bio mesh and Nissen fundoplication, redo on 5/78 which was complicated by esophageal perforation, right pneumothorax requiring right chest tube placement, robotic assisted repair of esophageal perforation, jejunostomy tube, mediastinal Blake drain placement.  TRH consulted 1/12 for management of medical issues.   Assessment & Plan:  Principal Problem:   S/P repair of paraesophageal hernia Active Problems:   Esophageal perforation   Essential hypertension: Currently controlled on IV metoprolol 5 mg every 8 hours.  Could transition from IV to J-tube metoprolol but want to make sure that she is consistently tolerating tube feed intake.  Tube feeds could not be advanced yesterday.  PTA on bisoprolol-HCTZ daily, Cardizem CD 240 mg daily.  Hypothyroidism: Last dose of Synthroid 75 mg daily was on 1/11 and can hold it for up to 7 days prior to considering IV if needed.  Hyperlipidemia: On Pravachol 80 mg daily PTA.  Mildly elevated AST and ALT, likely due to tube feeds, hold off on starting statins at this time.  Resume when able.  Hypokalemia: Replaced.  Magnesium 1.8.  Follow BMP in AM.  Anemia: Likely multifactorial.  Baseline hemoglobin probably in the 11 g range.  This is dropped to 9.8 in the absence of overt bleeding.  EBL  from 2 sets of procedures: 40 mL.  Follow CBC in a.m. and transfuse if hemoglobin 7 g or less.  S/p robotic assisted laparoscopic paraesophageal hernia repair 4/69, complicated by esophageal leak requiring immediate takeback for robotic assisted primary repair of esophageal perforation, placement of mediastinal drain/right chest tube/feeding jejunostomy tube: Management per primary service.  Will remain n.p.o. x 5 days, enteral feeding via jejunostomy tube, no pills via jejunostomy tube, liquids only.  IV Zosyn.  Multimodality pain control.  Added Tylenol liquid because patient did not want to use anything stronger at this time.  Mild transaminitis: ?  Related to tube feeds.  Follow CMP daily.  Statins on hold.  Leukocytosis: May be stress response versus infectious etiology.  On IV Zosyn.  Follow daily CBCs.   Body mass index is 27.08 kg/m.  Nutritional Status Nutrition Problem: Increased nutrient needs Etiology: post-op healing Signs/Symptoms: estimated needs Interventions: Tube feeding, Prostat  Pressure Ulcer:     DVT prophylaxis: enoxaparin (LOVENOX) injection 40 mg Start: 04/16/22 0800 SCDs Start: 04/15/22 1132     Code Status: Full Code:  Family Communication: Spouse at bedside Disposition:  Status is: Inpatient Remains inpatient appropriate because: N.p.o., G-tube feeding, multiple lines and tubes.  Several days before patient will be medically ready for DC.     Consultants:   [facility] consultants.  Procedures:   As above  Antimicrobials:   IV Zosyn   Subjective:  Right-sided chest tube site pain after working with PT.  Asking for Tylenol.  Does not want to use stronger pain meds at this time.  Also complains of throat discomfort and was  told that she was going to get a throat spray but has not yet.  Denies any other complaints.  Objective:   Vitals:   04/16/22 2347 04/17/22 0330 04/17/22 0427 04/17/22 0804  BP: (!) 98/52 111/78  127/69  Pulse: 86 89  92   Resp: 18 16    Temp: 98.4 F (36.9 C) 98.2 F (36.8 C)  98.7 F (37.1 C)  TempSrc: Oral Oral  Oral  SpO2: 100% 93%  94%  Weight:   62.9 kg   Height:        General exam: Elderly female, small built and frail lying comfortably propped up in bed without distress. Respiratory system: Reduced breath sounds in the bases.  Slightly harsh breath sounds bilaterally.  Occasional rhonchi.  No crackles.  Has NG tube.  Right-sided chest tube. Cardiovascular system: S1 & S2 heard, RRR. No JVD, murmurs, rubs, gallops or clicks. No pedal edema. Gastrointestinal system: Abdomen is nondistended, soft and nontender. No organomegaly or masses felt. Normal bowel sounds heard.  Gastrostomy tube site without acute findings. Central nervous system: Alert and oriented. No focal neurological deficits.  May be somewhat hard of hearing. Extremities: Symmetric 5 x 5 power. Skin: No rashes, lesions or ulcers Psychiatry: Judgement and insight appear somewhat impaired. Mood & affect appropriate.     Data Reviewed:   I have personally reviewed following labs and imaging studies   CBC: Recent Labs  Lab 04/15/22 1644 04/17/22 0632  WBC 11.4* 15.3*  HGB 11.4* 9.8*  HCT 36.2 31.2*  MCV 86.4 86.7  PLT 283 902    Basic Metabolic Panel: Recent Labs  Lab 04/15/22 1644 04/17/22 0632  NA  --  134*  K  --  3.3*  CL  --  103  CO2  --  24  GLUCOSE  --  131*  BUN  --  21  CREATININE 0.66 0.73  CALCIUM  --  7.5*  MG  --  1.8  PHOS  --  2.9    Liver Function Tests: Recent Labs  Lab 04/17/22 0632  AST 110*  ALT 87*  ALKPHOS 63  BILITOT 0.2*  PROT 5.3*  ALBUMIN 2.3*    CBG: Recent Labs  Lab 04/16/22 2348 04/17/22 0330 04/17/22 0814  GLUCAP 150* 142* 141*    Microbiology Studies:  No results found for this or any previous visit (from the past 240 hour(s)).  Radiology Studies:  DG Chest Port 1 View  Result Date: 04/16/2022 CLINICAL DATA:  Pneumothorax EXAM: PORTABLE CHEST 1 VIEW  COMPARISON:  04/15/2022. FINDINGS: Diffuse pulmonary interstitial and alveolar opacities consistent with pulmonary edema. Right-sided pleural effusion. Left base consolidation. Right sided chest tube in place. No pneumothorax identified. There is subcutaneous emphysema bilaterally. Aorta is calcified. NG tube tip extends below the diaphragm and off x-ray. IMPRESSION: Interstitial and alveolar opacity consistent with pulmonary edema. Right-sided pleural effusion. Left base consolidation. Electronically Signed   By: Sammie Bench M.D.   On: 04/16/2022 08:49   X-ray chest PA or AP  Result Date: 04/15/2022 CLINICAL DATA:  Chest tube esophageal perforation EXAM: CHEST  1 VIEW COMPARISON:  CT 04/15/2022, chest x-ray 10/17/2020 FINDINGS: Interval placement of right-sided chest tube with tip at the medial mid right chest. Esophageal tube tip below the diaphragm but incompletely visualized. Large volume subcutaneous emphysema in the right chest wall. Small right-sided pleural effusion. No appreciable pneumothorax on this exam. Cardiomegaly with bilateral lung base left greater than right airspace disease. Probable soft tissue emphysema in the  left breast/chest wall. IMPRESSION: 1. Interval placement of right-sided chest tube with tip at the medial mid right chest. No appreciable pneumothorax on this exam. Large volume subcutaneous emphysema in the right chest wall. Probable emphysema in the left breast or chest wall soft tissues. 2. Small pleural effusions 3. Cardiomegaly with vascular congestion and left greater than right basilar airspace disease. Electronically Signed   By: Donavan Foil M.D.   On: 04/15/2022 23:17   CT CHEST WO CONTRAST  Result Date: 04/15/2022 CLINICAL DATA:  Possible esophageal perforation. History of Nissen. Recent surgery. EXAM: CT CHEST WITHOUT CONTRAST TECHNIQUE: Multidetector CT imaging of the chest was performed following the standard protocol without IV contrast. RADIATION DOSE  REDUCTION: This exam was performed according to the departmental dose-optimization program which includes automated exposure control, adjustment of the mA and/or kV according to patient size and/or use of iterative reconstruction technique. COMPARISON:  Esophagram 10/27/2021. CT abdomen and pelvis 09/29/2021. FINDINGS: Cardiovascular: Heart is mildly enlarged. There is no pericardial effusion. Aorta is normal in size. There are atherosclerotic calcifications of the aorta and coronary arteries. Mediastinum/Nodes: Oral contrast is seen throughout the esophagus. 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 There is evidence for extravasation of oral contrast along the left side of the Nissen image 2/97 and minimally on the right side near the superior aspect of the Nissen image 2/80. There is no extravasation of oral contrast within the upper abdomen. The visualized thyroid gland is within normal limits. No enlarged lymph nodes are seen. Lungs/Pleura: There is a moderate-sized right pneumothorax (proximally 30%). There is no mediastinal shift. There are small bilateral pleural effusions with bilateral lower lobe atelectasis/airspace disease. The trachea and central airways are patent. 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 Upper Abdomen: There is a small amount of free air in the upper abdomen compatible with recent surgery. Musculoskeletal: There is bilateral chest wall emphysema. No acute fractures are identified. IMPRESSION: 1. Status post Nissen fundoplication. There is evidence for extravasation of oral contrast along the left side of the fundoplication and minimally on the right side near the superior aspect of the Nissen compatible with bowel leak. 2. Moderate-sized right pneumothorax. No mediastinal shift. 3. Small bilateral pleural effusions with bilateral lower lobe atelectasis/airspace disease. 4. Small amount of free air in  the upper abdomen compatible with recent surgery. 5. Bilateral chest wall emphysema. Aortic Atherosclerosis (ICD10-I70.0). These results were called by telephone at the time of interpretation on 04/15/2022 at 5:21 pm to provider DIEGO PABON , who verbally acknowledged these results. Electronically Signed   By: Ronney Asters M.D.   On: 04/15/2022 17:22   DG ESOPHAGUS W SINGLE CM (SOL OR THIN BA)  Result Date: 04/15/2022 CLINICAL DATA:  Postop from repair of recurrent paraesophageal hiatal hernia. Prior hiatal hernia repair. EXAM: ESOPHOGRAM/BARIUM SWALLOW TECHNIQUE: Single contrast examination was performed using water-soluble Omnipaque contrast. FLUOROSCOPY: Radiation Exposure Index (as provided by the fluoroscopic device): 12.8 mGy Kerma COMPARISON:  None Available. FINDINGS: No evidence of esophageal obstruction. Narrowing of the proximal stomach is seen just below the diaphragm, which is likely due to fundoplication wrap. Just above this, near the expected level of the GE junction, there is contrast opacification of a thin tract, with contrast extending above the diaphragm in a rounded structure, which is suspicious for a supra-diaphragmatic portion the wrap. Subsequently, an area contrast accumulation is seen to the left of this structure, along the left hemidiaphragm and extending superiorly in the  left mediastinum along the descending aorta. This has an amorphous shape, and is suspicious for extraluminal contrast leak/extravasation into the mediastinum. IMPRESSION: Postop from hiatal hernia repair. Contrast opacification of rounded structure above the diaphragm likely represents a portion of the fundoplication wrap. Another amorphous contrast collection in the left mediastinum is highly suspicious for postop contrast leak/extravasation into the mediastinum. Consider chest CT without contrast for confirmation. These results were called by telephone at the time of interpretation on 04/15/2022 at 3:52 pm to  provider DIEGO PABON , who verbally acknowledged these results. Electronically Signed   By: Marlaine Hind M.D.   On: 04/15/2022 15:53    Scheduled Meds:    Chlorhexidine Gluconate Cloth  6 each Topical Daily   enoxaparin (LOVENOX) injection  40 mg Subcutaneous Q24H   feeding supplement (PROSource TF20)  60 mL Per Tube Daily   free water  90 mL Per Tube Q4H   ketorolac  15 mg Intravenous Q6H   metoprolol tartrate  5 mg Intravenous Q8H    Continuous Infusions:    dextrose 5% lactated ringers 50 mL/hr at 04/17/22 0332   feeding supplement (OSMOLITE 1.2 CAL) 60 mL/hr at 04/17/22 0332   piperacillin-tazobactam (ZOSYN)  IV 3.375 g (04/17/22 0530)     LOS: 1 day     Vernell Leep, MD,  FACP, FHM, [facility], [facility], [place]     To contact the attending provider between 7A-7P or the covering provider during after hours 7P-7A, please log into the web site [email or web address] and access using universal [place] password for that web site. If you do not have the password, please call the hospital operator.  04/17/2022, 11:05 AM

## 2022-04-17 NOTE — Progress Notes (Signed)
OT Cancellation Note  Patient Details Name: Kelly Wood MRN: [id number] DOB: 20-Mar-1941   Cancelled Treatment:    Reason Eval/Treat Not Completed: Pain limiting ability to participate. OT orders received, chart reviewed. Pt reporting abdominal pain at rest and deferred any OOB mobility at this time. RN present to administer pain meds. Pt requesting OT come back later today. Will re-attempt as able.  [name] 04/17/2022, 12:06 PM

## 2022-04-17 NOTE — Clinical Note (Incomplete)
CROSS COVER NOTE  NAME: Kelly Wood MRN: [id number] DOB : 1940-08-21   HPI/Events of Note     Assessment and  Interventions   Assessment:  Plan: X X X

## 2022-04-17 NOTE — Evaluation (Signed)
Occupational Therapy Evaluation Patient Details Name: Kelly Wood MRN: [id number] DOB: February 05, 1941 Today's Date: 04/17/2022   History of Present Illness Pt admitted to [facility] on 04/15/22 with c/o persistent bloating and eructation with chest discomfort. S/p repair of paraesophageal hernia robotically 69moago. S/p robotic assisted laparoscopic paraesophageal hernia repair complicated by esophageal leak requiring immediate take back for robotic assisted primary repair of esophageal perforation, placement of mediastinal drain, feeding jejunostomy placement, and right chest tube placements.   Clinical Impression   Patient seen for OT evaluation. Spouse/family present. PTA pt lived with spouse, was independent for ADLs, shared responsibility of IADLs with spouse, and was independent for functional mobility without an AD. [place] using log roll technique, Min A to stand from EOB, and CGA-Min A to take several lateral steps at EOB using RW. Anticipate set up-Min A for ADL tasks. Pt will benefit from acute OT to increase overall independence in the areas of ADLs and functional mobility in order to safely discharge home. [facility] to decrease falls risk, improve balance, and maximize independence in self-care within own home environment.    Recommendations for follow up therapy are one component of a multi-disciplinary discharge planning process, led by the attending physician.  Recommendations may be updated based on patient status, additional functional criteria and insurance authorization.   Follow Up Recommendations  Home health OT     Assistance Recommended at Discharge Intermittent Supervision/Assistance  Patient can return home with the following A little help with walking and/or transfers;A little help with bathing/dressing/bathroom;Assistance with cooking/housework;Assist for transportation;Help with stairs or ramp for  entrance    Functional Status Assessment  Patient has had a recent decline in their functional status and demonstrates the ability to make significant improvements in function in a reasonable and predictable amount of time.  Equipment Recommendations  None recommended by OT    Recommendations for Other Services       Precautions / Restrictions Precautions Precautions: Fall Precaution Comments: abdominal (log roll, splinting), chest tube, JP drain, NG tube, G tube Restrictions Weight Bearing Restrictions: No      Mobility Bed Mobility Overal bed mobility: Needs Assistance Bed Mobility: Supine to Sit, Sit to Supine, Rolling Rolling: Min assist   Supine to sit: Min assist, HOB elevated Sit to supine: Min assist   General bed mobility comments: grossly min assist for perform log roll to enter/exit bed; step by step cues for safety and sequencing. increased time/effort and reliance on handrails to facilitate mobility    Transfers Overall transfer level: Needs assistance Equipment used: Rolling walker (2 wheels) Transfers: Sit to/from Stand Sit to Stand: Min assist                  Balance Overall balance assessment: Needs assistance Sitting-balance support: Bilateral upper extremity supported, Feet supported Sitting balance-Leahy Scale: Good     Standing balance support: Bilateral upper extremity supported Standing balance-Leahy Scale: Fair                             ADL either performed or assessed with clinical judgement   ADL Overall ADL's : Needs assistance/impaired     Grooming: Set up;Sitting Grooming Details (indicate cue type and reason): anticipate         Upper Body Dressing : Set up;Sitting Upper Body Dressing Details (indicate cue type and reason): anticipate Lower Body Dressing:  Minimal assistance;Sit to/from stand Lower Body Dressing Details (indicate cue type and reason): anticipate Toilet Transfer: Minimal assistance;Rolling  walker (2 wheels) Toilet Transfer Details (indicate cue type and reason): simulated Toileting- Clothing Manipulation and Hygiene: Minimal assistance;Sit to/from stand Toileting - Clothing Manipulation Details (indicate cue type and reason): anticipate     Functional mobility during ADLs: Minimal assistance;Min guard;Rolling walker (2 wheels) (to take ~3 lateral steps at EOB, Min A for RW management)       Vision Baseline Vision/History: 1 Wears glasses Patient Visual Report: No change from baseline       Perception     Praxis      Pertinent Vitals/Pain Pain Assessment Pain Assessment: Faces Faces Pain Scale: Hurts a little bit Pain Location: abdominal/incisional Pain Descriptors / Indicators: Discomfort, Guarding Pain Intervention(s): Monitored during session, Premedicated before session, Limited activity within patient's tolerance, Repositioned     Hand Dominance Right   Extremity/Trunk Assessment Upper Extremity Assessment Upper Extremity Assessment: Generalized weakness   Lower Extremity Assessment Lower Extremity Assessment: Generalized weakness   Cervical / Trunk Assessment Cervical / Trunk Assessment: Normal   Communication Communication Communication: No difficulties   Cognition Arousal/Alertness: Awake/alert Behavior During Therapy: WFL for tasks assessed/performed Overall Cognitive Status: Within Functional Limits for tasks assessed                                       General Comments  all lines/leads intact post session    Exercises Other Exercises Other Exercises: OT provided education re: role of OT, OT POC, post acute recs, sitting up for all meals, EOB/OOB mobility with assistance, home/fall safety, log roll technique   Shoulder Instructions      Home Living Family/patient expects to be discharged to:: Private residence Living Arrangements: Spouse/significant other Available Help at Discharge: Family;Available 24  hours/day Type of Home: House Home Access: Stairs to enter [employer] of Steps: 2 Entrance Stairs-Rails: Right Home Layout: Two level;Able to live on main level with bedroom/bathroom Alternate Level Stairs-Number of Steps: no essential needs on upper level of home   Bathroom Shower/Tub: Tub/shower unit   Bathroom Toilet: Standard Bathroom Accessibility: Yes   Home Equipment: Conservation officer, nature (2 wheels);Rollator (4 wheels);Cane - single point;BSC/3in1;Shower seat;Tub bench          Prior Functioning/Environment Prior Level of Function : Independent/Modified Independent;Driving             Mobility Comments: Ambulation without AD; no hx of falls ADLs Comments: IND ADLs, shared responsibility for IADLs with spouse, IND med mgmt        OT Problem List: Decreased strength;Decreased activity tolerance;Impaired balance (sitting and/or standing);Pain;Decreased knowledge of precautions      OT Treatment/Interventions: Self-care/ADL training;Therapeutic activities;Therapeutic exercise;DME and/or AE instruction;Patient/family education;Balance training    OT Goals(Current goals can be found in the care plan section) Acute Rehab OT Goals Patient Stated Goal: return home OT Goal Formulation: With patient/family Time For Goal Achievement: 05/01/22 Potential to Achieve Goals: Good   OT Frequency: Min 2X/week    Co-evaluation              AM-PAC OT "6 Clicks" Daily Activity     Outcome Measure Help from another person eating meals?: None Help from another person taking care of personal grooming?: A Little Help from another person toileting, which includes using toliet, bedpan, or urinal?: A Little Help from another person bathing (including washing,  rinsing, drying)?: A Little Help from another person to put on and taking off regular upper body clothing?: None Help from another person to put on and taking off regular lower body clothing?: A Little 6 Click Score:  20   End of Session Equipment Utilized During Treatment: Gait belt;Rolling walker (2 wheels);Oxygen Nurse Communication: Mobility status  Activity Tolerance: Patient tolerated treatment well;Patient limited by pain Patient left: in bed;with call bell/phone within reach;with bed alarm set;with family/visitor present  OT Visit Diagnosis: Muscle weakness (generalized) (M62.81);Pain;Unsteadiness on feet (R26.81) Pain - Right/Left:  (abdominal)                Time: 5208-0223 OT Time Calculation (min): 21 min Charges:  OT General Charges $OT Visit: 1 Visit OT Evaluation $OT Eval Low Complexity: 1 Low  [facility] MS, OTR/L ascom [phone]  04/17/22, 5:37 PM

## 2022-04-17 NOTE — Progress Notes (Signed)
   04/17/22 0000  Provider Notification  Provider Name/Title Dr. Luther Bradley  Date Provider Notified 04/16/22  Time Provider Notified 2355  Method of Notification Call  Notification Reason Other (Comment) (Patient unable to tolerate increase in tube feeding rate, maintains at 64m/hr. Goal rate is 665mhr.)  Provider response Other (Comment) (Continue same 3017mn hour rate for now.)  Date of Provider Response 04/16/22  Time of Provider Response 2355

## 2022-04-18 ENCOUNTER — Inpatient Hospital Stay: Payer: Medicare HMO

## 2022-04-18 DIAGNOSIS — I471 Supraventricular tachycardia, unspecified: Secondary | ICD-10-CM | POA: Diagnosis not present

## 2022-04-18 DIAGNOSIS — Z8719 Personal history of other diseases of the digestive system: Secondary | ICD-10-CM | POA: Diagnosis not present

## 2022-04-18 DIAGNOSIS — Z9889 Other specified postprocedural states: Secondary | ICD-10-CM | POA: Diagnosis not present

## 2022-04-18 LAB — COMPREHENSIVE METABOLIC PANEL
ALT: 78 U/L — ABNORMAL HIGH (ref 0–44)
AST: 61 U/L — ABNORMAL HIGH (ref 15–41)
Albumin: 2.2 g/dL — ABNORMAL LOW (ref 3.5–5.0)
Alkaline Phosphatase: 111 U/L (ref 38–126)
Anion gap: 5 (ref 5–15)
BUN: 15 mg/dL (ref 8–23)
CO2: 24 mmol/L (ref 22–32)
Calcium: 7.4 mg/dL — ABNORMAL LOW (ref 8.9–10.3)
Chloride: 106 mmol/L (ref 98–111)
Creatinine, Ser: 0.5 mg/dL (ref 0.44–1.00)
GFR, Estimated: >60 mL/min (ref >60)
Glucose, Bld: 149 mg/dL — ABNORMAL HIGH (ref 70–99)
Potassium: 3.6 mmol/L (ref 3.5–5.1)
Sodium: 135 mmol/L (ref 135–145)
Total Bilirubin: 0.5 mg/dL (ref 0.3–1.2)
Total Protein: 5.5 g/dL — ABNORMAL LOW (ref 6.5–8.1)

## 2022-04-18 LAB — GLUCOSE, CAPILLARY
Glucose-Capillary: 159 mg/dL — ABNORMAL HIGH (ref 70–99)
Glucose-Capillary: 139 mg/dL — ABNORMAL HIGH (ref 70–99)
Glucose-Capillary: 152 mg/dL — ABNORMAL HIGH (ref 70–99)
Glucose-Capillary: 135 mg/dL — ABNORMAL HIGH (ref 70–99)
Glucose-Capillary: 149 mg/dL — ABNORMAL HIGH (ref 70–99)
Glucose-Capillary: 103 mg/dL — ABNORMAL HIGH (ref 70–99)
Glucose-Capillary: 112 mg/dL — ABNORMAL HIGH (ref 70–99)

## 2022-04-18 LAB — T4, FREE: Free T4: 0.99 ng/dL (ref 0.61–1.12)

## 2022-04-18 LAB — MAGNESIUM: Magnesium: 1.9 mg/dL (ref 1.7–2.4)

## 2022-04-18 LAB — MRSA NEXT GEN BY PCR, NASAL: MRSA by PCR Next Gen: NOT DETECTED

## 2022-04-18 LAB — TSH: TSH: 6.278 u[IU]/mL — ABNORMAL HIGH (ref 0.350–4.500)

## 2022-04-18 MED ORDER — DILTIAZEM HCL-DEXTROSE 125-5 MG/125ML-% IV SOLN (PREMIX)
5.0000 mg/h | INTRAVENOUS | Status: DC
  Administered 2022-04-18: 5 mg/h via INTRAVENOUS
  Filled 2022-04-18 (×2): qty 125

## 2022-04-18 MED ORDER — AMIODARONE HCL IN DEXTROSE 360-4.14 MG/200ML-% IV SOLN
60.0000 mg/h | INTRAVENOUS | Status: DC
  Administered 2022-04-18 (×2): 60 mg/h via INTRAVENOUS
  Filled 2022-04-18: qty 200

## 2022-04-18 MED ORDER — CHLORHEXIDINE GLUCONATE CLOTH 2 % EX PADS
6.0000 | MEDICATED_PAD | Freq: Every day | CUTANEOUS | Status: DC
  Administered 2022-04-18 – 2022-04-28 (×10): 6 via TOPICAL

## 2022-04-18 MED ORDER — AMIODARONE HCL IN DEXTROSE 360-4.14 MG/200ML-% IV SOLN
INTRAVENOUS | Status: AC
  Filled 2022-04-18: qty 200

## 2022-04-18 MED ORDER — METOPROLOL TARTRATE 5 MG/5ML IV SOLN
2.5000 mg | INTRAVENOUS | Status: AC | PRN
  Administered 2022-04-18: 2.5 mg via INTRAVENOUS
  Administered 2022-04-21: 5 mg via INTRAVENOUS
  Filled 2022-04-18 (×2): qty 5

## 2022-04-18 MED ORDER — AMIODARONE HCL IN DEXTROSE 360-4.14 MG/200ML-% IV SOLN
30.0000 mg/h | INTRAVENOUS | Status: DC
  Administered 2022-04-19 – 2022-04-22 (×9): 30 mg/h via INTRAVENOUS
  Filled 2022-04-18 (×9): qty 200

## 2022-04-18 MED ORDER — ONDANSETRON HCL 4 MG/2ML IJ SOLN
INTRAMUSCULAR | Status: AC
  Filled 2022-04-18: qty 2

## 2022-04-18 MED ORDER — ONDANSETRON HCL 4 MG/2ML IJ SOLN
4.0000 mg | Freq: Once | INTRAMUSCULAR | Status: AC
  Administered 2022-04-18: 4 mg via INTRAVENOUS

## 2022-04-18 MED ORDER — LORAZEPAM 2 MG/ML IJ SOLN
1.0000 mg | Freq: Once | INTRAMUSCULAR | Status: AC
  Administered 2022-04-18: 1 mg via INTRAVENOUS
  Filled 2022-04-18: qty 1

## 2022-04-18 MED ORDER — DILTIAZEM LOAD VIA INFUSION
15.0000 mg | Freq: Once | INTRAVENOUS | Status: AC
  Administered 2022-04-18: 15 mg via INTRAVENOUS
  Filled 2022-04-18: qty 15

## 2022-04-18 MED ORDER — AMIODARONE HCL 200 MG PO TABS
200.0000 mg | ORAL_TABLET | Freq: Two times a day (BID) | ORAL | Status: DC
  Administered 2022-04-18: 200 mg via ORAL
  Filled 2022-04-18: qty 1

## 2022-04-18 MED ORDER — AMIODARONE HCL 200 MG PO TABS: 200.0000 mg | ORAL_TABLET | Freq: Every day | ORAL | Status: DC

## 2022-04-18 MED ORDER — LEVOTHYROXINE SODIUM 50 MCG PO TABS: 75.0000 ug | ORAL_TABLET | Freq: Every day | ORAL | Status: DC

## 2022-04-18 MED ORDER — IOHEXOL 350 MG/ML SOLN
100.0000 mL | Freq: Once | INTRAVENOUS | Status: AC | PRN
  Administered 2022-04-18: 100 mL via INTRAVENOUS

## 2022-04-18 MED ORDER — AMIODARONE LOAD VIA INFUSION
150.0000 mg | Freq: Once | INTRAVENOUS | Status: AC
  Administered 2022-04-18: 150 mg via INTRAVENOUS
  Filled 2022-04-18: qty 83.34

## 2022-04-18 MED ORDER — POTASSIUM CHLORIDE 20 MEQ PO PACK
40.0000 meq | PACK | Freq: Once | ORAL | Status: AC
  Administered 2022-04-18: 40 meq
  Filled 2022-04-18: qty 2

## 2022-04-18 NOTE — Progress Notes (Addendum)
[facility] Day(s): 2.   Post op day(s): 3 Days Post-Op.   Interval History:  Patient seen and examined, appreciate medical transition to stepdown unit, with cardiology consultation and CCU care.  For apparent SVT. No other acute events or new complaints overnight.  Patient reports she is feeling okay given the circumstances No fever, chills, nausea, emesis Minimal incisional soreness NGT in place; output 350 ccs Drain present; mediastinal; 12 ccs recorded; output serosanguinous  Chest tube present; right; 50 ccs out; serosanguinous; no air leak NPO She and her husband understand and we will resume increasing tube feeds gradually toward goal. She is still reluctant to have Foley removed at present.  Vital signs in last 24 hours: [min-max] current  Temp:  [97.6 F (36.4 C)-98.4 F (36.9 C)] 97.9 F (36.6 C) (01/14 0900) Pulse Rate:  [81-158] 83 (01/14 1143) Resp:  [16-25] 22 (01/14 1143) BP: (116-155)/(71-103) 117/71 (01/14 1100) SpO2:  [90 %-97 %] 93 % (01/14 1143) FiO2 (%):  [2 %] 2 % (01/13 2320) Weight:  [62.5 kg] 62.5 kg (01/14 0500)     Height: 5' (152.4 cm) Weight: 62.5 kg BMI (Calculated): 26.91   Intake/Output last 2 shifts:  01/13 0701 - 01/14 0700 In: -  Out: 1982 [Urine:900; Emesis/NG output:350; Drains:682; Chest Tube:50]   Physical Exam:  Constitutional: alert, cooperative and no distress  HEENT: NGT in place, checked to [facility] sump patency. Chest: Right sided chest tube; site CDI; minimal serosanguinous output; no air leak  Respiratory: breathing non-labored at rest; on [place] Cardiovascular: regular rate and sinus rhythm  Gastrointestinal: soft, non-tender, and non-distended, no rebound/guarding. Mediastinal drain; minimal serosanguinous output Genitourinary: Foley in place Integumentary: Laparoscopic incisions are CDI; no erythema  Labs:     Latest Ref Rng & Units 04/17/2022    6:32 AM 04/15/2022    4:44 PM  04/08/2021    4:57 AM  CBC  WBC 4.0 - 10.5 K/uL 15.3  11.4  14.8   Hemoglobin 12.0 - 15.0 g/dL 9.8  11.4  11.5   Hematocrit 36.0 - 46.0 % 31.2  36.2  35.3   Platelets 150 - 400 K/uL 254  283  234       Latest Ref Rng & Units 04/18/2022    6:16 AM 04/17/2022    6:32 AM 04/15/2022    4:44 PM  CMP  Glucose 70 - 99 mg/dL 149  131    BUN 8 - 23 mg/dL 15  21    Creatinine 0.44 - 1.00 mg/dL 0.50  0.73  0.66   Sodium 135 - 145 mmol/L 135  134    Potassium 3.5 - 5.1 mmol/L 3.6  3.3    Chloride 98 - 111 mmol/L 106  103    CO2 22 - 32 mmol/L 24  24    Calcium 8.9 - 10.3 mg/dL 7.4  7.5    Total Protein 6.5 - 8.1 g/dL 5.5  5.3    Total Bilirubin 0.3 - 1.2 mg/dL 0.5  0.2    Alkaline Phos 38 - 126 U/L 111  63    AST 15 - 41 U/L 61  110    ALT 0 - 44 U/L 78  87      Imaging studies: No new pertinent imaging studies   Assessment/Plan:  82 y.o. female 3 Days Post-Op s/p robotic assisted laparoscopic paraesophageal hernia repair complicated by esophageal leak requiring immediate take back for robotic assisted primary repair of esophageal perforation, placement  of mediastinal drain, feeding jejunostomy placement, and right chest tube placements.    -Appreciate transfer to stepdown unit for cardiology evaluation/management of SVT. - Will need to be strict NPO x5 total days; add Cepacol spray for discomfort, okay for mouth swabs, hard candies, gum  - Repeat swallow study Next week Wednesday   - Okay to advance enteric feeding via jejunostomy today, resuming scheduled toward goal; liquids only; NO PILLS via jejunostomy - Continue NGT decompression; LIS; monitor and record output - DC foley as willing, possibly tomorrow. - Continue Blake drain in mediastinum; monitor and record output - Continue chest tube; monitor and record output   - Continue IV Abx (Zosyn)   - Monitor abdominal examination - Pain control prn; antiemetics prn - Can mobilize with therapies; engage daily -Appreciate medicine for  assistance with chronic medical conditions and medication management: Rx thyroid and dysrrythmia   All of the above findings and recommendations were discussed with the patient, patient's family, and the medical team, and all of patient's and family's questions were answered to their expressed satisfaction.  Ronny Bacon, M.D., [facility]  04/18/2022 ; 2:09 PM   Reviewed CT of chest and a 2 by 4 cm fluid collection is noted on the right lateral margin of the distal esophagus near the fundoplication, decreased.  Moderate bilat pleural effusions; images reviewed with Dr. Dahlia Byes, and we feel this is sufficiently small it may be monitored.   Ronny Bacon, M.D., [facility]  04/18/2022 ; 6:33 PM

## 2022-04-18 NOTE — Progress Notes (Signed)
   04/17/22 2311  Assess: MEWS Score  BP (!) 140/103  MAP (mmHg) 116  Pulse Rate (!) 158  Resp 20  SpO2 92 %  O2 Device Nasal Cannula  O2 Flow Rate (L/min) 2 L/min  Assess: MEWS Score  MEWS Temp 0  MEWS Systolic 0  MEWS Pulse 3  MEWS RR 0  MEWS LOC 0  MEWS Score 3  MEWS Score Color Yellow  Treat  MEWS Interventions Escalated (See documentation below)  Pain Scale 0-10  Pain Score 0  Escalate  MEWS: Escalate Yellow: discuss with charge nurse/RN and consider discussing with provider and RRT  Notify: Charge Nurse/RN  Name of Charge Nurse/RN Notified Almyra Free RN  Date Charge Nurse/RN Notified 04/17/22  Time Charge Nurse/RN Notified 2315  Provider Notification  Provider Name/Title Sharion Settler NP  Date Provider Notified 04/17/22  Time Provider Notified 2315  Method of Notification Page (Secure chat)  Notification Reason Other (Comment) (ST, Heart rate in the 140s to 150s.)  Provider response See new orders (1x Metoprolol '5mg'$  IV)  Date of Provider Response 04/17/22  Time of Provider Response 2315  Assess: SIRS CRITERIA  SIRS Temperature  0  SIRS Pulse 1  SIRS Respirations  0  SIRS WBC 0  SIRS Score Sum  1

## 2022-04-18 NOTE — Progress Notes (Signed)
   04/17/22 2320  Assess: MEWS Score  Temp 97.6 F (36.4 C)  BP (!) 150/83  MAP (mmHg) 102  Pulse Rate (!) 106  Resp 20  Level of Consciousness Alert  SpO2 94 %  O2 Device Nasal Cannula  FiO2 (%) (!) 2 %  Assess: MEWS Score  MEWS Temp 0  MEWS Systolic 0  MEWS Pulse 1  MEWS RR 0  MEWS LOC 0  MEWS Score 1  MEWS Score Color Green  Document  Patient Outcome Stabilized after interventions  Progress note created (see row info) Yes  Assess: SIRS CRITERIA  SIRS Temperature  0  SIRS Pulse 1  SIRS Respirations  0  SIRS WBC 0  SIRS Score Sum  1

## 2022-04-18 NOTE — Progress Notes (Signed)
Patient has orders for transfer to acute care, notified ICU bed 1. Was told will get a call back.

## 2022-04-18 NOTE — Progress Notes (Addendum)
PROGRESS NOTE   Kelly Wood  VQM:[id number]    DOB: [birth year]    DOA: 04/15/2022  PCP: Idelle Crouch, MD   I have briefly reviewed patients previous medical records in [facility].  No chief complaint on file.   Brief Narrative:  82 year old married female, PMH of HTN, HLD, hypothyroid, COPD, anemia, GERD, s/p repair of paraesophageal hernia robotically approximately 11 months ago, presented to ED on 04/15/2022 with persistent bloating, eructation and chest discomfort.  She was diagnosed with symptomatic recurrent paraesophageal hernia.  Admitted by the surgical team.  She underwent robotic assisted laparoscopic repair of paraesophageal hernia with bio mesh and Nissen fundoplication, redo on 9/32 which was complicated by esophageal perforation, right pneumothorax requiring right chest tube placement, robotic assisted repair of esophageal perforation, jejunostomy tube, mediastinal Blake drain placement.  TRH consulted 1/12 for management of medical issues.  1/14: New onset narrow complex tachycardia, SVT, transferred to stepdown.  Cardiology consulted.   Assessment & Plan:  Principal Problem:   S/P repair of paraesophageal hernia Active Problems:   Esophageal perforation  SVT Developed 1/14 morning.  Rates up to 160s.  Did not see prior history of A-fib, a flutter or SVT.  Per patient and spouse, told to have "tachycardia" without specification.  Not on anticoagulation.  Home dose of Cardizem CD to 40 mg daily and Ziac (bisoprolol 5 Mg) on hold since admission due to n.p.o.  Blood pressure was being managed with metoprolol 5 Mg IV every 8 hours until this morning.  Transfer orders to stepdown.  Reviewed stat EKG which showed narrow complex tachycardia with ventricular rate in the 160s concerning for SVT versus atrial flutter with 2 and 1 block.  Chest x-ray stat ordered and reviewed, enlarging right pleural effusion despite pigtail chest tube, cardiomegaly and pulmonary edema  although clinically without CHF, may even look under volume.  TTE ordered and pending.  Ordered IVP Cardizem followed by Cardizem infusion. TSH: 6.278 (may be sick euthyroid.  Will check free T4.  Initiate Synthroid when able.  Cardiology consulted and their input appreciated.  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  They have initiated amiodarone via G-tube and have stopped IV Cardizem.  Chart review now indicates rates in the 80s, unsure if she has reverted back to sinus rhythm.  Will get an EKG.  Essential hypertension: PTA on bisoprolol-HCTZ daily, Cardizem CD 240 mg daily.  Was treating here with metoprolol 5 Mg every 8 hours until this morning when this was increased to 5 Mg every 6 hours.  Will attempt to transition IV metoprolol to J-tube metoprolol ( As communicated with surgical team this morning, they were okay with resuming Cardizem CD via G-tube)  Hypothyroidism: Last dose of Synthroid 75 mg daily was on 1/11 and can hold it for up to 7 days prior to considering IV if needed.  Starting Synthroid via J-tube.  Hyperlipidemia: On Pravachol 80 mg daily PTA.  Mildly elevated AST and ALT, likely due to tube feeds, hold off on starting statins at this time.  Resume when able.  LFTs slightly better.  Hypokalemia: Continue to replace as needed.  Magnesium 1.9.  Follow BMP in AM.  Anemia: Likely multifactorial.  Baseline hemoglobin probably in the 11 g range.  This is dropped to 9.8 in the absence of overt bleeding.  EBL from 2 sets of procedures: 40 mL.  Follow CBC in a.m. and transfuse if hemoglobin 7 g or less.  I  believe there was a CBC order for this morning but for some reason do not see results.  S/p robotic assisted laparoscopic paraesophageal hernia repair 5/79, complicated by esophageal leak requiring immediate takeback for robotic assisted primary repair of esophageal perforation, placement of mediastinal drain/right chest tube/feeding  jejunostomy tube: Management per primary service.  Will remain n.p.o. x 5 days, enteral feeding via jejunostomy tube, no pills via jejunostomy tube, liquids only.  IV Zosyn.  Multimodality pain control.  Added Tylenol liquid because patient did not want to use anything stronger at this time.  Mild transaminitis: ?  Related to tube feeds.  Follow CMP daily.  Statins on hold.  LFTs slightly better.  Leukocytosis: May be stress response versus infectious etiology.  On IV Zosyn.  Follow daily CBCs.   Body mass index is 26.91 kg/m.  Nutritional Status Nutrition Problem: Increased nutrient needs Etiology: post-op healing Signs/Symptoms: estimated needs Interventions: Tube feeding, Prostat  Pressure Ulcer:     DVT prophylaxis: enoxaparin (LOVENOX) injection 40 mg Start: 04/16/22 0800 SCDs Start: 04/15/22 1132     Code Status: Full Code:  Family Communication: Spouse at bedside, updated care and answered all questions. Disposition:  Status is: Inpatient Remains inpatient appropriate because: N.p.o., G-tube feeding, multiple lines and tubes.  Several days before patient will be medically ready for DC.     Consultants:   [facility] consultants. Cardiology  Procedures:   As above  Antimicrobials:   IV Zosyn   Subjective:  As I walked in the room, RN was about to send a message to me regarding tachycardia in the 160s.  Patient having intermittent palpitations, mild intermittent dyspnea, some back pain and pain around chest tube site but no anginal type of chest pain.  Spouse reported that over the last 24 hours, had 5 mushy BMs.  Did get a dose of morphine overnight.  Objective:   Vitals:   04/18/22 0900 04/18/22 1000 04/18/22 1100 04/18/22 1143  BP: (!) 155/97 (!) 116/93 117/71   Pulse: (!) 126 (!) 147 81 83  Resp: (!) 25 (!) 21 (!) 24 (!) 22  Temp: 97.9 F (36.6 C)     TempSrc: Oral     SpO2: 94% 90% 92% 93%  Weight:      Height:        General exam: Elderly female, small  built and frail looks somewhat uncomfortable compared to yesterday morning.  No obvious distress. Respiratory system: Reduced breath sounds in the bases, right greater than sign left.  Otherwise clear to auscultation without wheezing or rhonchi.  No increased work of breathing.  Has NG tube.  Right-sided chest tube. Cardiovascular system: S1 & S2 heard, regular tachycardia. No JVD, murmurs, rubs, gallops or clicks. No pedal edema.  Telemetry personally reviewed: Had 2-3 transient episodes of narrow complex tachycardia overnight but since 8 AM this morning, sustained narrow complex tachycardia in the 160s. Gastrointestinal system: Abdomen is nondistended, soft and nontender. No organomegaly or masses felt. Normal bowel sounds heard.  Gastrostomy tube site without acute findings. Central nervous system: Alert and oriented. No focal neurological deficits.  May be somewhat hard of hearing. Extremities: Symmetric 5 x 5 power. Skin: No rashes, lesions or ulcers Psychiatry: Judgement and insight appear somewhat impaired. Mood & affect appropriate.     Data Reviewed:   I have personally reviewed following labs and imaging studies   CBC: Recent Labs  Lab 04/15/22 1644 04/17/22 0632  WBC 11.4* 15.3*  HGB 11.4* 9.8*  HCT 36.2 31.2*  MCV 86.4 86.7  PLT 283 355    Basic Metabolic Panel: Recent Labs  Lab 04/15/22 1644 04/17/22 0632 04/18/22 0616  NA  --  134* 135  K  --  3.3* 3.6  CL  --  103 106  CO2  --  24 24  GLUCOSE  --  131* 149*  BUN  --  21 15  CREATININE 0.66 0.73 0.50  CALCIUM  --  7.5* 7.4*  MG  --  1.8 1.9  PHOS  --  2.9  --     Liver Function Tests: Recent Labs  Lab 04/17/22 0632 04/18/22 0616  AST 110* 61*  ALT 87* 78*  ALKPHOS 63 111  BILITOT 0.2* 0.5  PROT 5.3* 5.5*  ALBUMIN 2.3* 2.2*    CBG: Recent Labs  Lab 04/18/22 0802 04/18/22 0901 04/18/22 1148  GLUCAP 139* 152* 135*    Microbiology Studies:   Recent Results (from the past 240 hour(s))   MRSA Next Gen by PCR, Nasal     Status: None   Collection Time: 04/18/22  9:03 AM   Specimen: Nasal Mucosa; Nasal Swab  Result Value Ref Range Status   MRSA by PCR Next Gen NOT DETECTED NOT DETECTED Final    Comment: (NOTE) The GeneXpert MRSA Assay (FDA approved for NASAL specimens only), is one component of a comprehensive MRSA colonization surveillance program. It is not intended to diagnose MRSA infection nor to guide or monitor treatment for MRSA infections. Test performance is not FDA approved in patients less than 55 years old. Performed at [facility], [address] [place],  [address]     Radiology Studies:  DG Chest [place] 1 View  Result Date: 04/18/2022 CLINICAL DATA:  Short of breath, status post repair of paraesophageal hernia EXAM: PORTABLE CHEST 1 VIEW COMPARISON:  Prior chest x-ray 04/16/2022 FINDINGS: Right-sided pigtail thoracostomy tube in unchanged position. Gastric tube remains present. The proximal side-hole overlies the upper stomach. A surgical drain is present overlying the mid epigastrium. Stable cardiomegaly. Atherosclerotic calcifications in the transverse aorta. Increasing right-sided pleural effusion with a somewhat loculated appearance. Pulmonary vascular congestion with mild interstitial edema. Persistent dense left basilar airspace opacity. IMPRESSION: 1. Enlarging loculated right pleural effusion despite the presence of the pigtail thoracostomy tube. 2. Cardiomegaly with mild pulmonary edema. 3. Unchanged support apparatus. Electronically Signed   By: Jacqulynn Cadet M.D.   On: 04/18/2022 08:46    Scheduled Meds:    amiodarone  200 mg Oral BID   Followed by   Derrill Memo ON 04/22/2022] amiodarone  200 mg Oral Daily   Chlorhexidine Gluconate Cloth  6 each Topical Daily   Chlorhexidine Gluconate Cloth  6 each Topical Q0600   enoxaparin (LOVENOX) injection  40 mg Subcutaneous Q24H   feeding supplement (PROSource TF20)  60 mL Per Tube Daily    free water  90 mL Per Tube Q4H   ketorolac  15 mg Intravenous Q6H   metoprolol tartrate  5 mg Intravenous Q6H   pantoprazole (PROTONIX) IV  40 mg Intravenous Q12H    Continuous Infusions:    dextrose 5% lactated ringers 50 mL/hr at 04/18/22 1100   feeding supplement (OSMOLITE 1.2 CAL) 10 mL/hr at 04/18/22 1100   piperacillin-tazobactam (ZOSYN)  IV Stopped (04/18/22 0913)     LOS: 2 days   Critical care time spent: 40 minutes  Vernell Leep, MD,  FACP, [place], [facility], [facility], [place]     To  contact the attending provider between 7A-7P or the covering provider during after hours 7P-7A, please log into the web site [email or web address] and access using universal [place] password for that web site. If you do not have the password, please call the hospital operator.  04/18/2022, 1:48 PM

## 2022-04-18 NOTE — Progress Notes (Signed)
Pt has had multiple episodes of Tachycardia as well as becoming diaphoretic whenever she has to have a bowel movement. Mds have been made aware and no new orders given. Pt had an episode about 10 min ago with HR staying in 130-135, and diaphoretic. Notified MD will continue to monitor.

## 2022-04-18 NOTE — Consult Note (Signed)
Cardiology Consultation:  Patient ID: Kelly Wood MRN: [id number]; DOB: 04/27/1940  Admit date: 04/15/2022 Date of Consult: 04/18/2022  Primary Care Provider: Idelle Crouch, MD Primary Cardiologist: None  Primary Electrophysiologist:  None   Patient Profile:  Kelly Wood is a 82 y.o. female with a hx of hypertension, COPD, paraesophageal hernia status postrepair, B12 deficiency who is being seen today for the evaluation of SVT at the request of [facility].  History of Present Illness:  Kelly Wood was admitted to the hospital on 04/15/2022 with symptomatic recurrent paraesophageal hernia.  She is status post laparoscopic repair with Nissen fundoplication which is a redo procedure on 04/15/2022.  She apparently had esophageal hernia repair last year.  Her course has been complicated by esophageal perforation as well as right pneumothorax requiring right chest tube placement.  She describes no prior cardiac history.  She reports she is weak and fatigued but in no distress.  This morning she developed supraventricular tachycardia with heart rate in the 140s.  I did EKG tracing from that time.  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   It is not irregular.  It is on and off rather fast.  This does not appear to be atrial fibrillation.  She reports no history of this either.  She is currently hemodynamically stable.  Again she still has a chest tube in place.  Serum creatinine 0.50.  She did have a slight transaminitis but this is improving.  TSH 6.27.  She is currently NPO.  She does have an NG tube in place.  She is receiving medications by this.  Heart Pathway Score:       Past Medical History: Past Medical History:  Diagnosis Date   Anemia    Aortic atherosclerosis ([place])    B12 deficiency    Cancer ([place])    basal cell   Cataract cortical, senile    COPD (chronic obstructive pulmonary disease) (HCC)    Dyspnea    Emphysema of lung (HCC)    GERD (gastroesophageal reflux disease)     History of hiatal hernia    Hyperlipidemia    Hypertension    Hypothyroidism    Osteoarthritis of left hip    Osteoporosis    Shingles     Past Surgical History: Past Surgical History:  Procedure Laterality Date   CATARACT EXTRACTION Right    CHEST TUBE INSERTION Right 04/15/2022   Procedure: CHEST TUBE INSERTION;  Surgeon: Jules Husbands, MD;  Location: [facility];  Service: General;  Laterality: Right;   COLONOSCOPY N/A 06/23/2016   Procedure: COLONOSCOPY;  Surgeon: Manya Silvas, MD;  Location: [facility];  Service: Endoscopy;  Laterality: N/A;   COLONOSCOPY  2007   ESOPHAGOGASTRODUODENOSCOPY  03/03/2021   HARDWARE REMOVAL Right 11/06/2020   Procedure: EXCHANGE OF LAG SCREW, RIGHT HIP;  Surgeon: Corky Mull, MD;  Location: [facility];  Service: Orthopedics;  Laterality: Right;   INSERTION OF MESH  04/07/2021   Procedure: INSERTION OF MESH;  Surgeon: Jules Husbands, MD;  Location: [facility];  Service: General;;   INTRAMEDULLARY (IM) NAIL INTERTROCHANTERIC Right 12/26/2018   Procedure: INTRAMEDULLARY (IM) NAIL INTERTROCHANTRIC;  Surgeon: Corky Mull, MD;  Location: [facility];  Service: Orthopedics;  Laterality: Right;   LAPAROSCOPIC NISSEN FUNDOPLICATION  85/05/7739   Procedure: LAPAROSCOPIC NISSEN FUNDOPLICATION;  Surgeon: Jules Husbands, MD;  Location: [facility];  Service: General;;   TUBAL LIGATION  1970   XI ROBOTIC ASSISTED PARAESOPHAGEAL HERNIA REPAIR N/A 04/07/2021   Procedure:  XI ROBOTIC ASSISTED PARAESOPHAGEAL HERNIA REPAIR with RNFA to assist;  Surgeon: Jules Husbands, MD;  Location: [facility];  Service: General;  Laterality: N/A;   XI ROBOTIC ASSISTED PARAESOPHAGEAL HERNIA REPAIR N/A 04/15/2022   Procedure: XI ROBOTIC ASSISTED PARAESOPHAGEAL HERNIA REPAIR, RNFA to assist;  Surgeon: Jules Husbands, MD;  Location: [facility];  Service: General;  Laterality: N/A;   XI ROBOTIC ASSISTED PARAESOPHAGEAL HERNIA REPAIR N/A 04/15/2022   Procedure: XI ROBOTIC ASSISTED PARAESOPHAGEAL  HERNIA REPAIR;  Surgeon: Jules Husbands, MD;  Location: [facility];  Service: General;  Laterality: N/A;     Home Medications:  Prior to Admission medications   Medication Sig Start Date End Date Taking? Authorizing Provider  albuterol (VENTOLIN HFA) 108 (90 Base) MCG/ACT inhaler Inhale 2 puffs into the lungs every 6 (six) hours as needed for wheezing or shortness of breath.   Yes [provider]  alendronate (FOSAMAX) 70 MG tablet Take 70 mg by mouth once a week. Mondays 11/03/18  Yes [provider]  aspirin EC 81 MG tablet Take 81 mg by mouth daily.   Yes [provider]  bisoprolol-hydrochlorothiazide (ZIAC) 5-6.25 MG tablet Take 1 tablet by mouth daily.   Yes [provider]  diltiazem (CARDIZEM CD) 240 MG 24 hr capsule Take 240 mg by mouth daily with supper.   Yes [provider]  ibuprofen (ADVIL) 200 MG tablet Take 400 mg by mouth every 6 (six) hours as needed for mild pain.   Yes [provider]  levothyroxine (SYNTHROID) 75 MCG tablet Take 75 mcg by mouth daily before breakfast. Take on an empty stomach with a glass of water at least 30-60 minutes before breakfast 10/23/18 04/15/22 Yes [provider]  Melatonin 5 MG CAPS Take 5 mg by mouth daily as needed (sleep).   Yes [provider]  Multiple Vitamin (MULTIVITAMIN WITH MINERALS) TABS tablet Take 1 tablet by mouth daily.   Yes [provider]  pantoprazole (PROTONIX) 40 MG tablet Take 40 mg by mouth 2 (two) times daily. 01/18/22  Yes [provider]  pravastatin (PRAVACHOL) 80 MG tablet Take 80 mg by mouth daily.   Yes [provider]  Probiotic Product (PROBIOTIC DAILY PO) Take 1 capsule by mouth daily.   Yes [provider]  tetrahydrozoline 0.05 % ophthalmic solution Place 1 drop into both eyes 2 (two) times daily as needed (irritated eyes (VISINE)).   Yes [provider]  Turmeric 500 MG TABS Take 500 mg by mouth daily.    Yes [provider]  fluocinonide gel (LIDEX) 2.75 % Apply 1 application topically daily as needed (eczema). 02/17/21   [provider]  furosemide (LASIX) 40 MG tablet Take 40 mg by mouth daily as needed for edema. 03/10/21   [provider]  metoCLOPramide (REGLAN) 5 MG tablet Take 1 tablet (5 mg total) by mouth every 8 (eight) hours as needed for nausea. 10/12/21   Pabon, Diego F, MD  ondansetron (ZOFRAN) 4 MG tablet Take 1 tablet (4 mg total) by mouth every 8 (eight) hours as needed for nausea or vomiting. 03/08/22   Jules Husbands, MD    Inpatient Medications: Scheduled Meds:  Chlorhexidine Gluconate Cloth  6 each Topical Daily   Chlorhexidine Gluconate Cloth  6 each Topical Q0600   enoxaparin (LOVENOX) injection  40 mg Subcutaneous Q24H   feeding supplement (PROSource TF20)  60 mL Per Tube Daily   free water  90 mL Per Tube Q4H  ketorolac  15 mg Intravenous Q6H   metoprolol tartrate  5 mg Intravenous Q6H   pantoprazole (PROTONIX) IV  40 mg Intravenous Q12H   Continuous Infusions:  dextrose 5% lactated ringers 50 mL/hr at 04/18/22 1100   diltiazem (CARDIZEM) infusion 15 mg/hr (04/18/22 1100)   feeding supplement (OSMOLITE 1.2 CAL) 10 mL/hr at 04/18/22 1100   piperacillin-tazobactam (ZOSYN)  IV Stopped (04/18/22 0913)   PRN Meds: acetaminophen (TYLENOL) oral liquid 160 mg/5 mL, albuterol, [DISCONTINUED] diphenhydrAMINE **OR** diphenhydrAMINE, hydrALAZINE, menthol-cetylpyridinium, morphine injection, naphazoline-glycerin, oxyCODONE, phenol  Allergies:    Allergies  Allergen Reactions   Azithromycin Diarrhea   Levofloxacin Diarrhea    Pt prefers not to take because gi side effects   Sulfa Antibiotics Rash    Social History:   Social History   Socioeconomic History   Marital status: Married    Spouse name: Jimmy   Number of children: 2   Years of education: Not on file   Highest education level: Not on file  Occupational History   Not on file   Tobacco Use   Smoking status: Former    Types: Cigarettes    Quit date: 10/24/2006    Years since quitting: 15.4   Smokeless tobacco: Never  Vaping Use   Vaping Use: Never used  Substance and Sexual Activity   Alcohol use: Yes    Alcohol/week: 1.0 standard drink of alcohol    Types: 1 Glasses of wine per week    Comment: rarely   Drug use: Never   Sexual activity: Not on file  Other Topics Concern   Not on file  Social History Narrative   Not on file   Social Determinants of Health   Financial Resource Strain: Not on file  Food Insecurity: No Food Insecurity (04/15/2022)   Hunger Vital Sign    Worried About Running Out of Food in the Last Year: Never true    Ran Out of Food in the Last Year: Never true  Transportation Needs: No Transportation Needs (04/15/2022)   PRAPARE - Hydrologist (Medical): No    Lack of Transportation (Non-Medical): No  Physical Activity: Not on file  Stress: Not on file  Social Connections: Not on file  Intimate Partner Violence: Not At Risk (04/15/2022)   Humiliation, Afraid, Rape, and Kick questionnaire    Fear of Current or Ex-Partner: No    Emotionally Abused: No    Physically Abused: No    Sexually Abused: No     Family History:    Family History  Problem Relation Age of Onset   Breast cancer Neg Hx      ROS:  All other ROS reviewed and negative. Pertinent positives noted in the HPI.     Physical Exam/Data:   Vitals:   04/18/22 0900 04/18/22 1000 04/18/22 1100 04/18/22 1143  BP: (!) 155/97 (!) 116/93 117/71   Pulse: (!) 126 (!) 147 81 83  Resp: (!) 25 (!) 21 (!) 24 (!) 22  Temp: 97.9 F (36.6 C)     TempSrc: Oral     SpO2: 94% 90% 92% 93%  Weight:      Height:        Intake/Output Summary (Last 24 hours) at 04/18/2022 1222 Last data filed at 04/18/2022 1100 Gross per 24 hour  Intake 713.84 ml  Output 1982 ml  Net -1268.16 ml       04/18/2022    5:00 AM 04/17/2022    4:27 AM 04/15/2022  6:28 AM  Last 3 Weights  Weight (lbs) 137 lb 12.6 oz 138 lb 10.7 oz 116 lb  Weight (kg) 62.5 kg 62.9 kg 52.617 kg    Body mass index is 26.91 kg/m.  General: Ill-appearing Head: Atraumatic, normal size  Eyes: PEERLA, EOMI  Neck: Supple, no JVD Endocrine: No thryomegaly Cardiac: Normal S1, S2; RRR; no murmurs, rubs, or gallops Lungs: Diminished breath sound bilaterally Abd: Soft, nontender, no hepatomegaly  Ext: No edema, pulses 2+ Musculoskeletal: No deformities, BUE and BLE strength normal and equal Skin: Warm and dry, no rashes   Neuro: Alert and oriented to person, place, time, and situation, CNII-XII grossly intact, no focal deficits  Psych: Normal mood and affect   EKG:  The EKG was personally reviewed and demonstrates: EKG from this morning, not uploaded to epic yet demonstrates supraventricular tachycardia heart rate 146 Telemetry:  Telemetry was personally reviewed and demonstrates: Sinus rhythm in the 80s  Relevant CV Studies: Echo pending  Laboratory Data: High Sensitivity Troponin:  No results for input(s): "TROPONINIHS" in the last 720 hours.   Cardiac EnzymesNo results for input(s): "TROPONINI" in the last 168 hours. No results for input(s): "TROPIPOC" in the last 168 hours.  Chemistry Recent Labs  Lab 04/15/22 1644 04/17/22 0632 04/18/22 0616  NA  --  134* 135  K  --  3.3* 3.6  CL  --  103 106  CO2  --  24 24  GLUCOSE  --  131* 149*  BUN  --  21 15  CREATININE 0.66 0.73 0.50  CALCIUM  --  7.5* 7.4*  GFRNONAA >60 >60 >60  ANIONGAP  --  7 5    Recent Labs  Lab 04/17/22 0632 04/18/22 0616  PROT 5.3* 5.5*  ALBUMIN 2.3* 2.2*  AST 110* 61*  ALT 87* 78*  ALKPHOS 63 111  BILITOT 0.2* 0.5   Hematology Recent Labs  Lab 04/15/22 1644 04/17/22 0632  WBC 11.4* 15.3*  RBC 4.19 3.60*  HGB 11.4* 9.8*  HCT 36.2 31.2*  MCV 86.4 86.7  MCH 27.2 27.2  MCHC 31.5 31.4  RDW 13.5 14.1  PLT 283 254   BNPNo results for input(s): "BNP", "PROBNP" in the last  168 hours.  DDimer No results for input(s): "DDIMER" in the last 168 hours.  Radiology/Studies:  DG Chest Port 1 View  Result Date: 04/18/2022 CLINICAL DATA:  Short of breath, status post repair of paraesophageal hernia EXAM: PORTABLE CHEST 1 VIEW COMPARISON:  Prior chest x-ray 04/16/2022 FINDINGS: Right-sided pigtail thoracostomy tube in unchanged position. Gastric tube remains present. The proximal side-hole overlies the upper stomach. A surgical drain is present overlying the mid epigastrium. Stable cardiomegaly. Atherosclerotic calcifications in the transverse aorta. Increasing right-sided pleural effusion with a somewhat loculated appearance. Pulmonary vascular congestion with mild interstitial edema. Persistent dense left basilar airspace opacity. IMPRESSION: 1. Enlarging loculated right pleural effusion despite the presence of the pigtail thoracostomy tube. 2. Cardiomegaly with mild pulmonary edema. 3. Unchanged support apparatus. Electronically Signed   By: Jacqulynn Cadet M.D.   On: 04/18/2022 08:46   DG Chest Port 1 View  Result Date: 04/16/2022 CLINICAL DATA:  Pneumothorax EXAM: PORTABLE CHEST 1 VIEW COMPARISON:  04/15/2022. FINDINGS: Diffuse pulmonary interstitial and alveolar opacities consistent with pulmonary edema. Right-sided pleural effusion. Left base consolidation. Right sided chest tube in place. No pneumothorax identified. There is subcutaneous emphysema bilaterally. Aorta is calcified. NG tube tip extends below the diaphragm and off x-ray. IMPRESSION: Interstitial and alveolar opacity consistent with pulmonary  edema. Right-sided pleural effusion. Left base consolidation. Electronically Signed   By: Sammie Bench M.D.   On: 04/16/2022 08:49   X-ray chest PA or AP  Result Date: 04/15/2022 CLINICAL DATA:  Chest tube esophageal perforation EXAM: CHEST  1 VIEW COMPARISON:  CT 04/15/2022, chest x-ray 10/17/2020 FINDINGS: Interval placement of right-sided chest tube with tip at the  medial mid right chest. Esophageal tube tip below the diaphragm but incompletely visualized. Large volume subcutaneous emphysema in the right chest wall. Small right-sided pleural effusion. No appreciable pneumothorax on this exam. Cardiomegaly with bilateral lung base left greater than right airspace disease. Probable soft tissue emphysema in the left breast/chest wall. IMPRESSION: 1. Interval placement of right-sided chest tube with tip at the medial mid right chest. No appreciable pneumothorax on this exam. Large volume subcutaneous emphysema in the right chest wall. Probable emphysema in the left breast or chest wall soft tissues. 2. Small pleural effusions 3. Cardiomegaly with vascular congestion and left greater than right basilar airspace disease. Electronically Signed   By: Donavan Foil M.D.   On: 04/15/2022 23:17   CT CHEST WO CONTRAST  Result Date: 04/15/2022 CLINICAL DATA:  Possible esophageal perforation. History of Nissen. Recent surgery. EXAM: CT CHEST WITHOUT CONTRAST TECHNIQUE: Multidetector CT imaging of the chest was performed following the standard protocol without IV contrast. RADIATION DOSE REDUCTION: This exam was performed according to the departmental dose-optimization program which includes automated exposure control, adjustment of the mA and/or kV according to patient size and/or use of iterative reconstruction technique. COMPARISON:  Esophagram 10/27/2021. CT abdomen and pelvis 09/29/2021. FINDINGS: Cardiovascular: Heart is mildly enlarged. There is no pericardial effusion. Aorta is normal in size. There are atherosclerotic calcifications of the aorta and coronary arteries. Mediastinum/Nodes: Oral contrast is seen throughout the esophagus. 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 There is evidence for extravasation of oral contrast along the left side of the Nissen image 2/97 and minimally on the right  side near the superior aspect of the Nissen image 2/80. There is no extravasation of oral contrast within the upper abdomen. The visualized thyroid gland is within normal limits. No enlarged lymph nodes are seen. Lungs/Pleura: There is a moderate-sized right pneumothorax (proximally 30%). There is no mediastinal shift. There are small bilateral pleural effusions with bilateral lower lobe atelectasis/airspace disease. The trachea and central airways are patent. 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 Upper Abdomen: There is a small amount of free air in the upper abdomen compatible with recent surgery. Musculoskeletal: There is bilateral chest wall emphysema. No acute fractures are identified. IMPRESSION: 1. Status post Nissen fundoplication. There is evidence for extravasation of oral contrast along the left side of the fundoplication and minimally on the right side near the superior aspect of the Nissen compatible with bowel leak. 2. Moderate-sized right pneumothorax. No mediastinal shift. 3. Small bilateral pleural effusions with bilateral lower lobe atelectasis/airspace disease. 4. Small amount of free air in the upper abdomen compatible with recent surgery. 5. Bilateral chest wall emphysema. Aortic Atherosclerosis (ICD10-I70.0). These results were called by telephone at the time of interpretation on 04/15/2022 at 5:21 pm to provider DIEGO PABON , who verbally acknowledged these results. Electronically Signed   By: Ronney Asters M.D.   On: 04/15/2022 17:22   DG ESOPHAGUS W SINGLE CM (SOL OR THIN BA)  Result Date: 04/15/2022 CLINICAL DATA:  Postop from repair of recurrent paraesophageal hiatal hernia. Prior hiatal hernia repair. EXAM: ESOPHOGRAM/BARIUM SWALLOW  TECHNIQUE: Single contrast examination was performed using water-soluble Omnipaque contrast. FLUOROSCOPY: Radiation Exposure Index (as provided by the fluoroscopic device): 12.8 mGy Kerma COMPARISON:  None Available. FINDINGS: No evidence of  esophageal obstruction. Narrowing of the proximal stomach is seen just below the diaphragm, which is likely due to fundoplication wrap. Just above this, near the expected level of the GE junction, there is contrast opacification of a thin tract, with contrast extending above the diaphragm in a rounded structure, which is suspicious for a supra-diaphragmatic portion the wrap. Subsequently, an area contrast accumulation is seen to the left of this structure, along the left hemidiaphragm and extending superiorly in the left mediastinum along the descending aorta. This has an amorphous shape, and is suspicious for extraluminal contrast leak/extravasation into the mediastinum. IMPRESSION: Postop from hiatal hernia repair. Contrast opacification of rounded structure above the diaphragm likely represents a portion of the fundoplication wrap. Another amorphous contrast collection in the left mediastinum is highly suspicious for postop contrast leak/extravasation into the mediastinum. Consider chest CT without contrast for confirmation. These results were called by telephone at the time of interpretation on 04/15/2022 at 3:52 pm to provider DIEGO PABON , who verbally acknowledged these results. Electronically Signed   By: Marlaine Hind M.D.   On: 04/15/2022 15:53    Assessment and Plan:   # Supraventricular tachycardia, likely AVNRT -I reviewed her EKG which is not uploaded in epic.  This is in the patient's chart.  This demonstrates an SVT with heart rate around 146.  She has a pseudo R wave in lead V1.  This is quite different from her normal EKG tracings.  To me, this represents AVNRT. -The trigger is likely her critical illness.  An echocardiogram has been ordered.  Chest x-ray concerning for cardiomegaly and pulmonary edema.  She clinically does not appear to have any of this.  We will follow-up her echo. -I see no evidence of A-fib at this time.  She is certainly high risk to develop A-fib as she is critically ill  with esophageal perforation after her paraesophageal hernia repair.  This is being managed by general surgery and hospital medicine. -Electrolytes are stable.  She is not in distress. -Her TSH is slightly elevated.  Adjustment of Synthroid by hospital medicine. -She is currently strict NPO.  She can receive medicines via NG tube.  I would recommend amiodarone.  She is likely to have more recurrence of arrhythmia.  Another option is adenosine as needed.  However given her critical illness I believe amiodarone is the best option as a short-term measure.  We will start her on amiodarone 200 mg twice daily for 7 days and then 200 mg daily after this.  Again once she is out of the hospital and recover from surgery this can likely just be stopped. -If she cannot receive crushed medications via her NG tube would recommend IV amiodarone today and then reevaluate need for oral medications.  Other alternative options could just be beta-blocker as needed. -Cardiology will follow along while here.  For questions or updates, please contact [place] Please consult [email or web address] for contact info under   Signed, [place] T. Audie Box, MD, [place]  04/18/2022 12:22 PM

## 2022-04-18 NOTE — Progress Notes (Signed)
Pt becoming more anxious as night is proceeding, HR and BP climbing and visibly anxious, tried to calm with therapeutic communication without significant improvement, reached out to MD to see if something prn can be ordered, per husband she did this last night as well. Will continue to monitor.

## 2022-04-19 ENCOUNTER — Inpatient Hospital Stay: Payer: Medicare HMO | Admitting: Radiology

## 2022-04-19 ENCOUNTER — Inpatient Hospital Stay: Payer: Medicare HMO

## 2022-04-19 ENCOUNTER — Inpatient Hospital Stay (HOSPITAL_COMMUNITY)
Admission: AD | Admit: 2022-04-19 | Discharge: 2022-04-19 | Disposition: A | Discharge status: Home / Self Care | Payer: Medicare HMO | Source: Home / Self Care | Attending: Internal Medicine | Admitting: Internal Medicine

## 2022-04-19 DIAGNOSIS — R410 Disorientation, unspecified: Secondary | ICD-10-CM | POA: Diagnosis not present

## 2022-04-19 DIAGNOSIS — I4892 Unspecified atrial flutter: Secondary | ICD-10-CM

## 2022-04-19 DIAGNOSIS — Z8719 Personal history of other diseases of the digestive system: Secondary | ICD-10-CM | POA: Diagnosis not present

## 2022-04-19 DIAGNOSIS — Z9889 Other specified postprocedural states: Secondary | ICD-10-CM | POA: Diagnosis not present

## 2022-04-19 DIAGNOSIS — I471 Supraventricular tachycardia, unspecified: Secondary | ICD-10-CM | POA: Diagnosis not present

## 2022-04-19 HISTORY — PX: IR PERC PLEURAL DRAIN W/INDWELL CATH W/IMG GUIDE: IMG5383

## 2022-04-19 LAB — ECHOCARDIOGRAM COMPLETE
AR max vel: 3.03 cm2
AV Area VTI: 3.79 cm2
AV Area mean vel: 3.1 cm2
AV Mean grad: 2.5 mmHg
AV Peak grad: 4.8 mmHg
Ao pk vel: 1.1 m/s
Area-P 1/2: 6.37 cm2
Height: 60 in
S' Lateral: 3 cm
Weight: 2204.6 oz

## 2022-04-19 LAB — COMPREHENSIVE METABOLIC PANEL
ALT: 54 U/L — ABNORMAL HIGH (ref 0–44)
AST: 31 U/L (ref 15–41)
Albumin: 2.2 g/dL — ABNORMAL LOW (ref 3.5–5.0)
Alkaline Phosphatase: 122 U/L (ref 38–126)
Anion gap: 8 (ref 5–15)
BUN: 15 mg/dL (ref 8–23)
CO2: 25 mmol/L (ref 22–32)
Calcium: 7.7 mg/dL — ABNORMAL LOW (ref 8.9–10.3)
Chloride: 103 mmol/L (ref 98–111)
Creatinine, Ser: 0.63 mg/dL (ref 0.44–1.00)
GFR, Estimated: >60 mL/min (ref >60)
Glucose, Bld: 118 mg/dL — ABNORMAL HIGH (ref 70–99)
Potassium: 4 mmol/L (ref 3.5–5.1)
Sodium: 136 mmol/L (ref 135–145)
Total Bilirubin: 0.5 mg/dL (ref 0.3–1.2)
Total Protein: 5.8 g/dL — ABNORMAL LOW (ref 6.5–8.1)

## 2022-04-19 LAB — CBC
HCT: 33.9 % — ABNORMAL LOW (ref 36.0–46.0)
Hemoglobin: 10.7 g/dL — ABNORMAL LOW (ref 12.0–15.0)
MCH: 27.2 pg (ref 26.0–34.0)
MCHC: 31.6 g/dL (ref 30.0–36.0)
MCV: 86 fL (ref 80.0–100.0)
Platelets: 323 10*3/uL (ref 150–400)
RBC: 3.94 MIL/uL (ref 3.87–5.11)
RDW: 14.4 % (ref 11.5–15.5)
WBC: 17.4 10*3/uL — ABNORMAL HIGH (ref 4.0–10.5)
nRBC: 0 % (ref 0.0–0.2)

## 2022-04-19 LAB — GLUCOSE, CAPILLARY
Glucose-Capillary: 101 mg/dL — ABNORMAL HIGH (ref 70–99)
Glucose-Capillary: 103 mg/dL — ABNORMAL HIGH (ref 70–99)
Glucose-Capillary: 104 mg/dL — ABNORMAL HIGH (ref 70–99)
Glucose-Capillary: 115 mg/dL — ABNORMAL HIGH (ref 70–99)
Glucose-Capillary: 124 mg/dL — ABNORMAL HIGH (ref 70–99)
Glucose-Capillary: 96 mg/dL (ref 70–99)

## 2022-04-19 MED ORDER — FUROSEMIDE 10 MG/ML IJ SOLN
20.0000 mg | Freq: Once | INTRAMUSCULAR | Status: AC
  Administered 2022-04-19: 20 mg via INTRAVENOUS
  Filled 2022-04-19: qty 2

## 2022-04-19 MED ORDER — LIDOCAINE HCL 1 % IJ SOLN
5.0000 mL | Freq: Once | INTRAMUSCULAR | Status: AC
  Administered 2022-04-19: 5 mL via INTRADERMAL

## 2022-04-19 MED ORDER — FENTANYL CITRATE (PF) 100 MCG/2ML IJ SOLN
INTRAMUSCULAR | Status: AC | PRN
  Administered 2022-04-19: 25 ug via INTRAVENOUS

## 2022-04-19 MED ORDER — FENTANYL CITRATE (PF) 100 MCG/2ML IJ SOLN
INTRAMUSCULAR | Status: AC
  Filled 2022-04-19: qty 2

## 2022-04-19 MED ORDER — OSMOLITE 1.2 CAL PO LIQD
1000.0000 mL | ORAL | Status: DC
  Administered 2022-04-20 – 2022-04-25 (×6): 1000 mL

## 2022-04-19 MED ORDER — FREE WATER
30.0000 mL | Status: DC
  Administered 2022-04-19 – 2022-04-21 (×12): 30 mL

## 2022-04-19 NOTE — Progress Notes (Signed)
   04/19/22 1200  Spiritual Encounters  Type of Visit Initial  Care provided to: Pt and family  Referral source Nurse (RN/NT/LPN)  Reason for visit Routine spiritual support  OnCall Visit No   Chaplain responded to nurse consult. Chaplain provided compassionate presence and reflective listening as patient and son spoke about patient's health challenges. New Resident Chaplain in training was present. Patient and family member appreciated [place] visit.

## 2022-04-19 NOTE — Progress Notes (Signed)
PT Cancellation Note  Patient Details Name: Kelly Wood MRN: [id number] DOB: Jun 06, 1940   Cancelled Treatment:    Reason Eval/Treat Not Completed: Patient not medically ready Pt noted to have transferred to higher level of care since PT evaluation. Per protocol, will require new PT orders, PT to complete current ones.  Lavone Nian, PT, DPT 04/19/22, 7:35 AM   Waunita Schooner 04/19/2022, 7:34 AM

## 2022-04-19 NOTE — Procedures (Signed)
Interventional Radiology Procedure Note  Procedure: US guided right chest tube placement  Indication: Right pleural effusion related to esophageal leak  Findings: Please refer to procedural dictation for full description.  Complications: None  EBL: < 10 mL  Miachel Roux, MD [phone]

## 2022-04-19 NOTE — Anesthesia Postprocedure Evaluation (Signed)
Anesthesia Post Note  Patient: Kelly Wood  Procedure(s) Performed: XI ROBOTIC ASSISTED PARAESOPHAGEAL HERNIA REPAIR, RNFA to assist (Abdomen)  Patient location during evaluation: PACU Anesthesia Type: General Level of consciousness: awake and alert Pain management: pain level controlled Vital Signs Assessment: post-procedure vital signs reviewed and stable Respiratory status: spontaneous breathing, nonlabored ventilation, respiratory function stable and patient connected to nasal cannula oxygen Cardiovascular status: blood pressure returned to baseline and stable Postop Assessment: no apparent nausea or vomiting Anesthetic complications: no   No notable events documented.   Last Vitals:  Vitals:   04/19/22 0500 04/19/22 0600  BP: 137/85 136/77  Pulse: 96 98  Resp: 18 (!) 37  Temp:    SpO2: 96% 97%    Last Pain:  Vitals:   04/19/22 0400  TempSrc: Oral  PainSc:                  Molli Barrows

## 2022-04-19 NOTE — Progress Notes (Addendum)
[facility] Day(s): 3.   Post op day(s): 4 Days Post-Op.   Interval History:  Patient seen and examined No issues overnight aside from anxiety Patient reports she is having a better morning; anxiety improved No significant abdominal pain; no complaints of COB nor CP No fever, chills, nausea, emesis Minimal incisional soreness NGT in place; output minimal; not recorded Drain present; mediastinal; minimal output; output serous  Chest tube present; right; no output recorded; serosanguinous; no air leak NPO Continues on Zosyn   Vital signs in last 24 hours: [min-max] current  Temp:  [97.8 F (36.6 C)-98.4 F (36.9 C)] 98.4 F (36.9 C) (01/15 0400) Pulse Rate:  [68-151] 98 (01/15 0600) Resp:  [17-37] 37 (01/15 0600) BP: (102-165)/(71-106) 136/77 (01/15 0600) SpO2:  [90 %-97 %] 97 % (01/15 0600)     Height: 5' (152.4 cm) Weight: 62.5 kg BMI (Calculated): 26.91   Intake/Output last 2 shifts:  01/14 0701 - 01/15 0700 In: 1495.5 [I.V.:914.3; NG/GT:226.5; IV Piggyback:354.7] Out: 1399 [Urine:1395; Stool:4]   Physical Exam:  Constitutional: alert, cooperative and no distress  HEENT: NGT in place, checked to [facility] sump patency. Chest: Right sided chest tube; site CDI; minimal serosanguinous output; no air leak  Respiratory: breathing non-labored at rest; on [place] Cardiovascular: regular rate and sinus rhythm  Gastrointestinal: soft, non-tender, and non-distended, no rebound/guarding. Mediastinal drain; minimal serous output Genitourinary: Foley in place Integumentary: Laparoscopic incisions are CDI; no erythema  Labs:     Latest Ref Rng & Units 04/19/2022    4:13 AM 04/17/2022    6:32 AM 04/15/2022    4:44 PM  CBC  WBC 4.0 - 10.5 K/uL 17.4  15.3  11.4   Hemoglobin 12.0 - 15.0 g/dL 10.7  9.8  11.4   Hematocrit 36.0 - 46.0 % 33.9  31.2  36.2   Platelets 150 - 400 K/uL 323  254  283       Latest Ref Rng & Units 04/19/2022    4:13  AM 04/18/2022    6:16 AM 04/17/2022    6:32 AM  CMP  Glucose 70 - 99 mg/dL 118  149  131   BUN 8 - 23 mg/dL '15  15  21   '$ Creatinine 0.44 - 1.00 mg/dL 0.63  0.50  0.73   Sodium 135 - 145 mmol/L 136  135  134   Potassium 3.5 - 5.1 mmol/L 4.0  3.6  3.3   Chloride 98 - 111 mmol/L 103  106  103   CO2 22 - 32 mmol/L '25  24  24   '$ Calcium 8.9 - 10.3 mg/dL 7.7  7.4  7.5   Total Protein 6.5 - 8.1 g/dL 5.8  5.5  5.3   Total Bilirubin 0.3 - 1.2 mg/dL 0.5  0.5  0.2   Alkaline Phos 38 - 126 U/L 122  111  63   AST 15 - 41 U/L 31  61  110   ALT 0 - 44 U/L 54  78  87     Imaging studies: No new pertinent imaging studies   Assessment/Plan:  82 y.o. female 4 Days Post-Op s/p robotic assisted laparoscopic paraesophageal hernia repair complicated by esophageal leak requiring immediate take back for robotic assisted primary repair of esophageal perforation, placement of mediastinal drain, feeding jejunostomy placement, and right chest tube placements.    - Discussed CT Chest/Abdomen/Pelvis with IR this morning. Agree with removal of current chest tube and placement of new chest tube  in right pleural effusion.   - Hold DVT prophylaxis today (01/15)  - Continue strict NPO status; anticipate until Tues/Wed and will reassess with UGI, okay for mouth swabs, hard candies, gum  - Okay to advance enteric feeding via jejunostomy today, resuming scheduled toward goal; liquids only; NO PILLS via jejunostomy - Continue NGT decompression; LIS; monitor and record output - Continue foley for now; patient preference  - Continue Blake drain in mediastinum; monitor and record output  - Continue IV Abx (Zosyn)   - Monitor abdominal examination - Pain control prn; antiemetics prn - Can mobilize with therapies; HH --> okay to resume therapies  -Appreciate medicine/PCCM for assistance  All of the above findings and recommendations were discussed with the patient, patient's family, and the medical team, and all of patient's  and family's questions were answered to their expressed satisfaction.  -- Edison Simon, PA-C [facility] 04/19/2022, 7:09 AM M-F: 7am - 4pm

## 2022-04-19 NOTE — Consult Note (Signed)
Chief Complaint: Patient was seen in consultation today for right pleural effusion with existing chest   Referring Physician(s): Edison Simon, PA-C  Supervising Physician: Mir, Sharen Heck  Patient Status: Kelly Wood  History of Present Illness: Kelly Wood is a 82 y.o. female with PMH significant for anemia, aortic atherosclerosis, B12 deficiency, COPD, emphysema, hypertension, history of hiatal hernia s/p repair on 04/15/22 with noted post-op leak, and existing right chest tube placed 04/15/22 for pneumothorax. CT Chest/Abdomen/Pelvis from 04/18/22 revealed the resolution of right sided pneumothorax, as well as the development of moderate bilateral pleural effusions right greater than left. IR was consulted for image-guided chest tube placement as existing chest tube was not visualized to be in the pleural effusion.  Past Medical History:  Diagnosis Date   Anemia    Aortic atherosclerosis ([place])    B12 deficiency    Cancer ([place])    basal cell   Cataract cortical, senile    COPD (chronic obstructive pulmonary disease) (HCC)    Dyspnea    Emphysema of lung (H[facility])    GERD (gastroesophageal reflux disease)    History of hiatal hernia    Hyperlipidemia    Hypertension    Hypothyroidism    Osteoarthritis of left hip    Osteoporosis    Shingles     Past Surgical History:  Procedure Laterality Date   CATARACT EXTRACTION Right    CHEST TUBE INSERTION Right 04/15/2022   Procedure: CHEST TUBE INSERTION;  Surgeon: Jules Husbands, MD;  Location: [facility];  Service: General;  Laterality: Right;   COLONOSCOPY N/A 06/23/2016   Procedure: COLONOSCOPY;  Surgeon: Manya Silvas, MD;  Location: [facility];  Service: Endoscopy;  Laterality: N/A;   COLONOSCOPY  2007   ESOPHAGOGASTRODUODENOSCOPY  03/03/2021   HARDWARE REMOVAL Right 11/06/2020   Procedure: EXCHANGE OF LAG SCREW, RIGHT HIP;  Surgeon: Corky Mull, MD;  Location: [facility];  Service: Orthopedics;  Laterality: Right;    INSERTION OF MESH  04/07/2021   Procedure: INSERTION OF MESH;  Surgeon: Jules Husbands, MD;  Location: [facility];  Service: General;;   INTRAMEDULLARY (IM) NAIL INTERTROCHANTERIC Right 12/26/2018   Procedure: INTRAMEDULLARY (IM) NAIL INTERTROCHANTRIC;  Surgeon: Corky Mull, MD;  Location: [facility];  Service: Orthopedics;  Laterality: Right;   LAPAROSCOPIC NISSEN FUNDOPLICATION  68/34/1962   Procedure: LAPAROSCOPIC NISSEN FUNDOPLICATION;  Surgeon: Jules Husbands, MD;  Location: [facility];  Service: General;;   TUBAL LIGATION  1970   XI ROBOTIC ASSISTED PARAESOPHAGEAL HERNIA REPAIR N/A 04/07/2021   Procedure: XI ROBOTIC ASSISTED PARAESOPHAGEAL HERNIA REPAIR with RNFA to assist;  Surgeon: Jules Husbands, MD;  Location: [facility];  Service: General;  Laterality: N/A;   XI ROBOTIC ASSISTED PARAESOPHAGEAL HERNIA REPAIR N/A 04/15/2022   Procedure: XI ROBOTIC ASSISTED PARAESOPHAGEAL HERNIA REPAIR, RNFA to assist;  Surgeon: Jules Husbands, MD;  Location: [facility];  Service: General;  Laterality: N/A;   XI ROBOTIC ASSISTED PARAESOPHAGEAL HERNIA REPAIR N/A 04/15/2022   Procedure: XI ROBOTIC ASSISTED PARAESOPHAGEAL HERNIA REPAIR;  Surgeon: Jules Husbands, MD;  Location: [facility];  Service: General;  Laterality: N/A;    Allergies: Azithromycin, Levofloxacin, and Sulfa antibiotics  Medications: Prior to Admission medications   Medication Sig Start Date End Date Taking? Authorizing Provider  albuterol (VENTOLIN HFA) 108 (90 Base) MCG/ACT inhaler Inhale 2 puffs into the lungs every 6 (six) hours as needed for wheezing or shortness of breath.   Yes [provider]  alendronate (FOSAMAX) 70  MG tablet Take 70 mg by mouth once a week. Mondays 11/03/18  Yes [provider]  aspirin EC 81 MG tablet Take 81 mg by mouth daily.   Yes [provider]  bisoprolol-hydrochlorothiazide (ZIAC) 5-6.25 MG tablet Take 1 tablet by mouth daily.   Yes [provider]  diltiazem (CARDIZEM CD) 240  MG 24 hr capsule Take 240 mg by mouth daily with supper.   Yes [provider]  ibuprofen (ADVIL) 200 MG tablet Take 400 mg by mouth every 6 (six) hours as needed for mild pain.   Yes [provider]  levothyroxine (SYNTHROID) 75 MCG tablet Take 75 mcg by mouth daily before breakfast. Take on an empty stomach with a glass of water at least 30-60 minutes before breakfast 10/23/18 04/15/22 Yes [provider]  Melatonin 5 MG CAPS Take 5 mg by mouth daily as needed (sleep).   Yes [provider]  Multiple Vitamin (MULTIVITAMIN WITH MINERALS) TABS tablet Take 1 tablet by mouth daily.   Yes [provider]  pantoprazole (PROTONIX) 40 MG tablet Take 40 mg by mouth 2 (two) times daily. 01/18/22  Yes [provider]  pravastatin (PRAVACHOL) 80 MG tablet Take 80 mg by mouth daily.   Yes [provider]  Probiotic Product (PROBIOTIC DAILY PO) Take 1 capsule by mouth daily.   Yes [provider]  tetrahydrozoline 0.05 % ophthalmic solution Place 1 drop into both eyes 2 (two) times daily as needed (irritated eyes (VISINE)).   Yes [provider]  Turmeric 500 MG TABS Take 500 mg by mouth daily.   Yes [provider]  fluocinonide gel (LIDEX) 6.96 % Apply 1 application topically daily as needed (eczema). 02/17/21   [provider]  furosemide (LASIX) 40 MG tablet Take 40 mg by mouth daily as needed for edema. 03/10/21   [provider]  metoCLOPramide (REGLAN) 5 MG tablet Take 1 tablet (5 mg total) by mouth every 8 (eight) hours as needed for nausea. 10/12/21   Pabon, Diego F, MD  ondansetron (ZOFRAN) 4 MG tablet Take 1 tablet (4 mg total) by mouth every 8 (eight) hours as needed for nausea or vomiting. 03/08/22   Jules Husbands, MD     Family History  Problem Relation Age of Onset   Breast cancer Neg Hx     Social History   Socioeconomic History   Marital status: Married    Spouse name: Jimmy   Number  of children: 2   Years of education: Not on file   Highest education level: Not on file  Occupational History   Not on file  Tobacco Use   Smoking status: Former    Types: Cigarettes    Quit date: 10/24/2006    Years since quitting: 15.4   Smokeless tobacco: Never  Vaping Use   Vaping Use: Never used  Substance and Sexual Activity   Alcohol use: Yes    Alcohol/week: 1.0 standard drink of alcohol    Types: 1 Glasses of wine per week    Comment: rarely   Drug use: Never   Sexual activity: Not on file  Other Topics Concern   Not on file  Social History Narrative   Not on file   Social Determinants of Health   Financial Resource Strain: Not on file  Food Insecurity: No Food Insecurity (04/15/2022)   Hunger Vital Sign    Worried About Running Out of Food in the Last Year: Never true  Ran Out of Food in the Last Year: Never true  Transportation Needs: No Transportation Needs (04/15/2022)   PRAPARE - Hydrologist (Medical): No    Lack of Transportation (Non-Medical): No  Physical Activity: Not on file  Stress: Not on file  Social Connections: Not on file    Review of Systems: A 12 point ROS discussed and pertinent positives are indicated in the HPI above.  All other systems are negative.  Review of Systems  Constitutional:  Negative for chills and fever.  Respiratory:  Positive for shortness of breath. Negative for chest tightness.   Cardiovascular:  Negative for chest pain and leg swelling.  Gastrointestinal:  Negative for abdominal pain, diarrhea, nausea and vomiting.  Neurological:  Negative for dizziness and headaches.  Psychiatric/Behavioral:  Negative for confusion.     Vital Signs: BP 136/77   Pulse 98   Temp 98.4 F (36.9 C) (Oral)   Resp (!) 37   Ht 5' (1.524 m)   Wt 137 lb 12.6 oz (62.5 kg)   SpO2 97%   BMI 26.91 kg/m    Physical Exam Vitals reviewed.  Constitutional:      General: She is not in acute distress.     Appearance: She is ill-appearing.  HENT:     Mouth/Throat:     Mouth: Mucous membranes are moist.  Cardiovascular:     Rate and Rhythm: Normal rate and regular rhythm.     Pulses: Normal pulses.     Heart sounds: Normal heart sounds.  Pulmonary:     Comments: Decreased breath sounds on right compared to left. Right sided chest tube in place. Increased breathing rate at time of exam, but no evidence of increased work of breathing during exam Abdominal:     Palpations: Abdomen is soft.     Tenderness: There is abdominal tenderness.     Comments: Jejunostomy tube in place with mild tenderness that the patient attributes to this  Musculoskeletal:     Right lower leg: No edema.     Left lower leg: No edema.  Skin:    General: Skin is warm and dry.  Neurological:     Mental Status: She is alert and oriented to person, place, and time.  Psychiatric:        Mood and Affect: Mood normal.        Behavior: Behavior normal.     Imaging: CT CHEST ABDOMEN PELVIS W CONTRAST  Result Date: 04/18/2022 CLINICAL DATA:  Paraesophageal hernia repair complicated by perforation EXAM: CT CHEST, ABDOMEN, AND PELVIS WITH CONTRAST TECHNIQUE: Multidetector CT imaging of the chest, abdomen and pelvis was performed following the standard protocol during bolus administration of intravenous contrast. RADIATION DOSE REDUCTION: This exam was performed according to the departmental dose-optimization program which includes automated exposure control, adjustment of the mA and/or kV according to patient size and/or use of iterative reconstruction technique. CONTRAST:  148m OMNIPAQUE IOHEXOL 350 MG/ML SOLN COMPARISON:  04/15/2022 FINDINGS: CT CHEST FINDINGS Cardiovascular: The heart is unremarkable without pericardial effusion. No evidence of thoracic aortic aneurysm or dissection. Atheromatous plaque within the descending thoracic aorta. Diffuse atherosclerosis of the aorta and coronary vasculature. Mediastinum/Nodes:  Minimal gas within the anterior mediastinum consistent with recent surgical intervention. Enteric catheter extends into the gastric lumen. Thyroid and trachea are unremarkable. Previous Nissen fundoplication, with small hiatal hernia again identified. Loculated fluid along the right lateral aspect of the hiatal hernia sac, measuring 4.0 x 2.3 cm, decreased since prior  study. There is intraluminal gas within the hiatal hernia at site of prior Nissen fundoplication. No extraluminal gas is seen within the lower mediastinum. Lungs/Pleura: Moderate bilateral pleural effusions, right greater than left. Dependent lower lobe atelectasis. Background emphysema. Right chest tube along the right mediastinal margin. No pneumothorax. Musculoskeletal: No acute or destructive bony lesions. Subcutaneous gas within the chest wall consistent with recent surgical intervention. Reconstructed images demonstrate no additional findings. CT ABDOMEN PELVIS FINDINGS Hepatobiliary: No focal liver abnormality is seen. No gallstones, gallbladder wall thickening, or biliary dilatation. Pancreas: Unremarkable. No pancreatic ductal dilatation or surrounding inflammatory changes. Spleen: Normal in size without focal abnormality. Adrenals/Urinary Tract: Adrenal glands are unremarkable. Kidneys are normal, without renal calculi, focal lesion, or hydronephrosis. Bladder is decompressed with a Foley catheter. Stomach/Bowel: No bowel obstruction or ileus. Diffuse colonic diverticulosis without evidence of diverticulitis. Normal appendix right lower quadrant. Previous Nissen fundoplication, with small hiatal hernia again noted. Enteric catheter extends into the gastric lumen. There is a percutaneous jejunostomy tube, tip within the right mid abdomen within the mid jejunum. Vascular/Lymphatic: Aortic atherosclerosis. No enlarged abdominal or pelvic lymph nodes. Reproductive: Uterus and bilateral adnexa are unremarkable. Other: Surgical drain enters the  abdomen in the left paraumbilical region, and extends superiorly through the sub hepatic region with tip residing at the diaphragmatic hiatus. There is no free fluid or free intraperitoneal gas. No abdominal wall hernia. Musculoskeletal: No acute or destructive bony lesions. Postsurgical changes right hip. Reconstructed images demonstrate no additional findings. IMPRESSION: 1. Postsurgical changes related to interval esophageal perforation repair. Surgical drain tip at the gastroesophageal junction at the diaphragmatic hiatus, with 4.0 x 2.3 cm residual fluid collection along the right lateral margin of the distal esophagus. No extraluminal gas to suggest persistent or recurrent perforation. 2. Moderate bilateral pleural effusions and dependent lower lobe atelectasis, right greater than left. Right chest tube in place, with no residual or recurrent pneumothorax. 3. Enteric catheter and percutaneous jejunostomy tube as above. 4. Colonic diverticulosis without diverticulitis. 5.  Aortic Atherosclerosis (ICD10-I70.0). Electronically Signed   By: Randa Ngo M.D.   On: 04/18/2022 17:45   DG Chest Port 1 View  Result Date: 04/18/2022 CLINICAL DATA:  Short of breath, status post repair of paraesophageal hernia EXAM: PORTABLE CHEST 1 VIEW COMPARISON:  Prior chest x-ray 04/16/2022 FINDINGS: Right-sided pigtail thoracostomy tube in unchanged position. Gastric tube remains present. The proximal side-hole overlies the upper stomach. A surgical drain is present overlying the mid epigastrium. Stable cardiomegaly. Atherosclerotic calcifications in the transverse aorta. Increasing right-sided pleural effusion with a somewhat loculated appearance. Pulmonary vascular congestion with mild interstitial edema. Persistent dense left basilar airspace opacity. IMPRESSION: 1. Enlarging loculated right pleural effusion despite the presence of the pigtail thoracostomy tube. 2. Cardiomegaly with mild pulmonary edema. 3. Unchanged  support apparatus. Electronically Signed   By: Jacqulynn Cadet M.D.   On: 04/18/2022 08:46   DG Chest Port 1 View  Result Date: 04/16/2022 CLINICAL DATA:  Pneumothorax EXAM: PORTABLE CHEST 1 VIEW COMPARISON:  04/15/2022. FINDINGS: Diffuse pulmonary interstitial and alveolar opacities consistent with pulmonary edema. Right-sided pleural effusion. Left base consolidation. Right sided chest tube in place. No pneumothorax identified. There is subcutaneous emphysema bilaterally. Aorta is calcified. NG tube tip extends below the diaphragm and off x-ray. IMPRESSION: Interstitial and alveolar opacity consistent with pulmonary edema. Right-sided pleural effusion. Left base consolidation. Electronically Signed   By: Sammie Bench M.D.   On: 04/16/2022 08:49   X-ray chest PA or AP  Result Date: 04/15/2022  CLINICAL DATA:  Chest tube esophageal perforation EXAM: CHEST  1 VIEW COMPARISON:  CT 04/15/2022, chest x-ray 10/17/2020 FINDINGS: Interval placement of right-sided chest tube with tip at the medial mid right chest. Esophageal tube tip below the diaphragm but incompletely visualized. Large volume subcutaneous emphysema in the right chest wall. Small right-sided pleural effusion. No appreciable pneumothorax on this exam. Cardiomegaly with bilateral lung base left greater than right airspace disease. Probable soft tissue emphysema in the left breast/chest wall. IMPRESSION: 1. Interval placement of right-sided chest tube with tip at the medial mid right chest. No appreciable pneumothorax on this exam. Large volume subcutaneous emphysema in the right chest wall. Probable emphysema in the left breast or chest wall soft tissues. 2. Small pleural effusions 3. Cardiomegaly with vascular congestion and left greater than right basilar airspace disease. Electronically Signed   By: Donavan Foil M.D.   On: 04/15/2022 23:17   CT CHEST WO CONTRAST  Result Date: 04/15/2022 CLINICAL DATA:  Possible esophageal perforation.  History of Nissen. Recent surgery. EXAM: CT CHEST WITHOUT CONTRAST TECHNIQUE: Multidetector CT imaging of the chest was performed following the standard protocol without IV contrast. RADIATION DOSE REDUCTION: This exam was performed according to the departmental dose-optimization program which includes automated exposure control, adjustment of the mA and/or kV according to patient size and/or use of iterative reconstruction technique. COMPARISON:  Esophagram 10/27/2021. CT abdomen and pelvis 09/29/2021. FINDINGS: Cardiovascular: Heart is mildly enlarged. There is no pericardial effusion. Aorta is normal in size. There are atherosclerotic calcifications of the aorta and coronary arteries. Mediastinum/Nodes: Oral contrast is seen throughout the esophagus. 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 There is evidence for extravasation of oral contrast along the left side of the Nissen image 2/97 and minimally on the right side near the superior aspect of the Nissen image 2/80. There is no extravasation of oral contrast within the upper abdomen. The visualized thyroid gland is within normal limits. No enlarged lymph nodes are seen. Lungs/Pleura: There is a moderate-sized right pneumothorax (proximally 30%). There is no mediastinal shift. There are small bilateral pleural effusions with bilateral lower lobe atelectasis/airspace disease. The trachea and central airways are patent. 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 Upper Abdomen: There is a small amount of free air in the upper abdomen compatible with recent surgery. Musculoskeletal: There is bilateral chest wall emphysema. No acute fractures are identified. IMPRESSION: 1. Status post Nissen fundoplication. There is evidence for extravasation of oral contrast along the left side of the fundoplication and minimally on the right side near the superior aspect of the Nissen compatible with  bowel leak. 2. Moderate-sized right pneumothorax. No mediastinal shift. 3. Small bilateral pleural effusions with bilateral lower lobe atelectasis/airspace disease. 4. Small amount of free air in the upper abdomen compatible with recent surgery. 5. Bilateral chest wall emphysema. Aortic Atherosclerosis (ICD10-I70.0). These results were called by telephone at the time of interpretation on 04/15/2022 at 5:21 pm to provider DIEGO PABON , who verbally acknowledged these results. Electronically Signed   By: Ronney Asters M.D.   On: 04/15/2022 17:22   DG ESOPHAGUS W SINGLE CM (SOL OR THIN BA)  Result Date: 04/15/2022 CLINICAL DATA:  Postop from repair of recurrent paraesophageal hiatal hernia. Prior hiatal hernia repair. EXAM: ESOPHOGRAM/BARIUM SWALLOW TECHNIQUE: Single contrast examination was performed using water-soluble Omnipaque contrast. FLUOROSCOPY: Radiation Exposure Index (as provided by the fluoroscopic device): 12.8 mGy Kerma COMPARISON:  None Available. FINDINGS: No evidence of esophageal  obstruction. Narrowing of the proximal stomach is seen just below the diaphragm, which is likely due to fundoplication wrap. Just above this, near the expected level of the GE junction, there is contrast opacification of a thin tract, with contrast extending above the diaphragm in a rounded structure, which is suspicious for a supra-diaphragmatic portion the wrap. Subsequently, an area contrast accumulation is seen to the left of this structure, along the left hemidiaphragm and extending superiorly in the left mediastinum along the descending aorta. This has an amorphous shape, and is suspicious for extraluminal contrast leak/extravasation into the mediastinum. IMPRESSION: Postop from hiatal hernia repair. Contrast opacification of rounded structure above the diaphragm likely represents a portion of the fundoplication wrap. Another amorphous contrast collection in the left mediastinum is highly suspicious for postop  contrast leak/extravasation into the mediastinum. Consider chest CT without contrast for confirmation. These results were called by telephone at the time of interpretation on 04/15/2022 at 3:52 pm to provider DIEGO PABON , who verbally acknowledged these results. Electronically Signed   By: Marlaine Hind M.D.   On: 04/15/2022 15:53    Labs:  CBC: Recent Labs    04/15/22 1644 04/17/22 0632 04/19/22 0413  WBC 11.4* 15.3* 17.4*  HGB 11.4* 9.8* 10.7*  HCT 36.2 31.2* 33.9*  PLT 283 254 323    COAGS: No results for input(s): "INR", "APTT" in the last 8760 hours.  BMP: Recent Labs    04/15/22 1644 04/17/22 0632 04/18/22 0616 04/19/22 0413  NA  --  134* 135 136  K  --  3.3* 3.6 4.0  CL  --  103 106 103  CO2  --  '24 24 25  '$ GLUCOSE  --  131* 149* 118*  BUN  --  '21 15 15  '$ CALCIUM  --  7.5* 7.4* 7.7*  CREATININE 0.66 0.73 0.50 0.63  GFRNONAA >60 >60 >60 >60    LIVER FUNCTION TESTS: Recent Labs    04/17/22 0632 04/18/22 0616 04/19/22 0413  BILITOT 0.2* 0.5 0.5  AST 110* 61* 31  ALT 87* 78* 54*  ALKPHOS 63 111 122  PROT 5.3* 5.5* 5.8*  ALBUMIN 2.3* 2.2* 2.2*    TUMOR MARKERS: No results for input(s): "AFPTM", "CEA", "CA199", "CHROMGRNA" in the last 8760 hours.  Assessment and Plan:  Kelly Wood is a 82 yo female being seen today for existing chest tube removal and image-guided right-sided chest tube with pigtail catheter placement secondary to pleural effusion. The patient has an existing right-sided chest tube initially placed 04/15/22 for a post-operative pneumothorax, with recent imaging indicating resolution of pneumothorax and new presence of bilateral pleural effusion with right greater than left. The case has been reviewed and approved by Dr Dwaine Gale and is set to proceed on 04/19/22.  Risks and benefits of chest tube placement were discussed with the patient including bleeding, infection, damage to adjacent structures, malfunction of the tube requiring additional procedures  and sepsis.  All of the patient's questions were answered, patient is agreeable to proceed. Consent signed and in chart.    Thank you for this interesting consult.  I greatly enjoyed meeting Kelly Wood and look forward to participating in their care.  A copy of this report was sent to the requesting provider on this date.  Electronically Signed: Lura Em, PA-C 04/19/2022, 9:30 AM   I spent a total of 40 Minutes    in face to face in clinical consultation, greater than 50% of which was counseling/coordinating care for image-guided chest  tube placement.

## 2022-04-19 NOTE — Progress Notes (Signed)
Rounding Note    Patient Name: Kelly Wood Date of Encounter: 04/19/2022  [place] Cardiologist: None   Subjective   She continues to be in sinus rhythm with IV amiodarone. No chest pain or shortness of breath.   Inpatient Medications    Scheduled Meds:  Chlorhexidine Gluconate Cloth  6 each Topical Daily   Chlorhexidine Gluconate Cloth  6 each Topical Q0600   enoxaparin (LOVENOX) injection  40 mg Subcutaneous Q24H   fentaNYL       free water  30 mL Per Tube Q4H   ketorolac  15 mg Intravenous Q6H   metoprolol tartrate  5 mg Intravenous Q6H   pantoprazole (PROTONIX) IV  40 mg Intravenous Q12H   Continuous Infusions:  amiodarone 30 mg/hr (04/19/22 1508)   feeding supplement (OSMOLITE 1.2 CAL) 20 mL/hr at 04/19/22 1654   piperacillin-tazobactam (ZOSYN)  IV 3.375 g (04/19/22 1408)   PRN Meds: acetaminophen (TYLENOL) oral liquid 160 mg/5 mL, albuterol, [DISCONTINUED] diphenhydrAMINE **OR** diphenhydrAMINE, fentaNYL, hydrALAZINE, menthol-cetylpyridinium, metoprolol tartrate, morphine injection, naphazoline-glycerin, oxyCODONE, phenol   Vital Signs    Vitals:   04/19/22 1612 04/19/22 1625 04/19/22 1629 04/19/22 1636  BP: (!) 172/99 (!) 167/84 (!) 163/79 (!) 159/87  Pulse: 88 92    Resp: 18 (!) 27 (!) 21 20  Temp:      TempSrc:      SpO2: 98% 96% 96% 96%  Weight:      Height:        Intake/Output Summary (Last 24 hours) at 04/19/2022 1837 Last data filed at 04/19/2022 1600 Gross per 24 hour  Intake 2089.09 ml  Output 1550 ml  Net 539.09 ml      04/18/2022    5:00 AM 04/17/2022    4:27 AM 04/15/2022    6:28 AM  Last 3 Weights  Weight (lbs) 137 lb 12.6 oz 138 lb 10.7 oz 116 lb  Weight (kg) 62.5 kg 62.9 kg 52.617 kg      Telemetry    Sinus rhythm with heart rate in the 90s.- Personally Reviewed  ECG     - Personally Reviewed  Physical Exam   GEN: No acute distress.   Neck: No JVD Cardiac: RRR, no murmurs, rubs, or gallops.   Respiratory: Clear to auscultation bilaterally. GI: Soft, nontender, non-distended  MS: No edema; No deformity. Neuro:  Nonfocal  Psych: Normal affect   Labs    High Sensitivity Troponin:  No results for input(s): "TROPONINIHS" in the last 720 hours.   Chemistry Recent Labs  Lab 04/17/22 [phone] 04/18/22 0616 04/19/22 0413  NA 134* 135 136  K 3.3* 3.6 4.0  CL 103 106 103  CO2 '24 24 25  '$ GLUCOSE 131* 149* 118*  BUN '21 15 15  '$ CREATININE 0.73 0.50 0.63  CALCIUM 7.5* 7.4* 7.7*  MG 1.8 1.9  --   PROT 5.3* 5.5* 5.8*  ALBUMIN 2.3* 2.2* 2.2*  AST 110* 61* 31  ALT 87* 78* 54*  ALKPHOS 63 111 122  BILITOT 0.2* 0.5 0.5  GFRNONAA >60 >60 >60  ANIONGAP '7 5 8    '$ Lipids No results for input(s): "CHOL", "TRIG", "HDL", "LABVLDL", "LDLCALC", "CHOLHDL" in the last 168 hours.  Hematology Recent Labs  Lab 04/15/22 1644 04/17/22 0632 04/19/22 0413  WBC 11.4* 15.3* 17.4*  RBC 4.19 3.60* 3.94  HGB 11.4* 9.8* 10.7*  HCT 36.2 31.2* 33.9*  MCV 86.4 86.7 86.0  MCH 27.2 27.2 27.2  MCHC 31.5 31.4 31.6  RDW 13.5 14.1  14.4  PLT 283 254 323   Thyroid  Recent Labs  Lab 04/18/22 0616  TSH 6.278*  FREET4 0.99    BNPNo results for input(s): "BNP", "PROBNP" in the last 168 hours.  DDimer No results for input(s): "DDIMER" in the last 168 hours.   Radiology    DG Chest Port 1 View  Result Date: 04/19/2022 CLINICAL DATA:  Chest tube placement EXAM: PORTABLE CHEST 1 VIEW COMPARISON:  04/18/2022 FINDINGS: Single frontal view of the chest was performed. There has been replacement of the right-sided chest tube, with new pigtail drainage catheter coiled over the right lateral mid chest. Decreased right pleural effusion after chest tube repositioning. Minimal gas at the right costophrenic angle likely due to recent intervention. Stable areas of bilateral consolidation and left pleural effusion. Enteric catheter unchanged. Cardiac silhouette remains enlarged. IMPRESSION: 1. Interval replacement of the  right pigtail drainage catheter, with decreased right pleural effusion after catheter replacement. Trace amount of gas within the right pleural space likely due to recent intervention. 2. Stable bilateral consolidation and left pleural effusion. Electronically Signed   By: Randa Ngo M.D.   On: 04/19/2022 16:58   ECHOCARDIOGRAM COMPLETE  Result Date: 04/19/2022    ECHOCARDIOGRAM REPORT   Patient Name:   Kelly Wood [facility] Date of Exam: 04/19/2022 Medical Rec #:  [id number]       Height:       60.0 in Accession #:    [id number]      Weight:       137.8 lb Date of Birth:  June 11, 1940       BSA:          1.593 m Patient Age:    82 years        BP:           136/77 mmHg Patient Gender: F               HR:           98 bpm. Exam Location:  [facility] Procedure: 2D Echo, Cardiac Doppler and Color Doppler Indications:     Atrial Flutter I48.92  History:         Patient has no prior history of Echocardiogram examinations.                  COPD; Risk Factors:Hypertension.  Sonographer:     Sherrie Sport Referring Phys:  [place] Diagnosing Phys: Kathlyn Sacramento MD  Sonographer Comments: Technically difficult study due to poor echo windows. IMPRESSIONS  1. Left ventricular ejection fraction, by estimation, is 55 to 60%. The left ventricle has normal function. Left ventricular endocardial border not optimally defined to evaluate regional wall motion. Left ventricular diastolic parameters are consistent with Grade I diastolic dysfunction (impaired relaxation).  2. Right ventricular systolic function is normal. The right ventricular size is normal. There is moderately elevated pulmonary artery systolic pressure. The estimated right ventricular systolic pressure is 37.6 mmHg.  3. Left atrial size was mildly dilated.  4. The mitral valve is normal in structure. Mild mitral valve regurgitation. No evidence of mitral stenosis. Moderate mitral annular calcification.  5. The aortic valve is normal in structure. Aortic valve  regurgitation is not visualized. Aortic valve sclerosis is present, with no evidence of aortic valve stenosis. FINDINGS  Left Ventricle: Left ventricular ejection fraction, by estimation, is 55 to 60%. The left ventricle has normal function. Left ventricular endocardial border not optimally defined to evaluate regional wall motion. The left  ventricular internal cavity size was normal in size. There is no left ventricular hypertrophy. Left ventricular diastolic parameters are consistent with Grade I diastolic dysfunction (impaired relaxation). Right Ventricle: The right ventricular size is normal. No increase in right ventricular wall thickness. Right ventricular systolic function is normal. There is moderately elevated pulmonary artery systolic pressure. The tricuspid regurgitant velocity is 3.46 m/s, and with an assumed right atrial pressure of 5 mmHg, the estimated right ventricular systolic pressure is 38.4 mmHg. Left Atrium: Left atrial size was mildly dilated. Right Atrium: Right atrial size was normal in size. Pericardium: There is no evidence of pericardial effusion. Mitral Valve: The mitral valve is normal in structure. Moderate mitral annular calcification. Mild mitral valve regurgitation. No evidence of mitral valve stenosis. Tricuspid Valve: The tricuspid valve is normal in structure. Tricuspid valve regurgitation is mild . No evidence of tricuspid stenosis. Aortic Valve: The aortic valve is normal in structure. Aortic valve regurgitation is not visualized. Aortic valve sclerosis is present, with no evidence of aortic valve stenosis. Aortic valve mean gradient measures 2.5 mmHg. Aortic valve peak gradient measures 4.8 mmHg. Aortic valve area, by VTI measures 3.79 cm. Pulmonic Valve: The pulmonic valve was normal in structure. Pulmonic valve regurgitation is not visualized. No evidence of pulmonic stenosis. Aorta: The aortic root is normal in size and structure. Venous: The inferior vena cava was not well  visualized. IAS/Shunts: No atrial level shunt detected by color flow Doppler.  LEFT VENTRICLE PLAX 2D LVIDd:         4.10 cm   Diastology LVIDs:         3.00 cm   LV e' medial:    4.79 cm/s LV PW:         1.00 cm   LV E/e' medial:  11.4 LV IVS:        1.00 cm   LV e' lateral:   6.64 cm/s LVOT diam:     2.00 cm   LV E/e' lateral: 8.2 LV SV:         66 LV SV Index:   42 LVOT Area:     3.14 cm  RIGHT VENTRICLE RV Basal diam:  3.40 cm RV Mid diam:    3.10 cm RV S prime:     17.80 cm/s TAPSE (M-mode): 2.4 cm LEFT ATRIUM             Index        RIGHT ATRIUM           Index LA diam:        3.80 cm 2.39 cm/m   RA Area:     11.20 cm LA Vol (A2C):   51.5 ml 32.32 ml/m  RA Volume:   20.60 ml  12.93 ml/m LA Vol (A4C):   41.8 ml 26.24 ml/m LA Biplane Vol: 50.5 ml 31.70 ml/m  AORTIC VALVE AV Area (Vmax):    3.03 cm AV Area (Vmean):   3.10 cm AV Area (VTI):     3.79 cm AV Vmax:           110.00 cm/s AV Vmean:          73.300 cm/s AV VTI:            0.175 m AV Peak Grad:      4.8 mmHg AV Mean Grad:      2.5 mmHg LVOT Vmax:         106.00 cm/s LVOT Vmean:  72.400 cm/s LVOT VTI:          0.211 m LVOT/AV VTI ratio: 1.21  AORTA Ao Root diam: 2.70 cm MITRAL VALVE               TRICUSPID VALVE MV Area (PHT): 6.37 cm    TR Peak grad:   47.9 mmHg MV Decel Time: 119 msec    TR Vmax:        346.00 cm/s MV E velocity: 54.50 cm/s MV A velocity: 94.90 cm/s  SHUNTS MV E/A ratio:  0.57        Systemic VTI:  0.21 m                            Systemic Diam: 2.00 cm Kathlyn Sacramento MD Electronically signed by Kathlyn Sacramento MD Signature Date/Time: 04/19/2022/1:40:45 PM    Final    CT CHEST ABDOMEN PELVIS W CONTRAST  Result Date: 04/18/2022 CLINICAL DATA:  Paraesophageal hernia repair complicated by perforation EXAM: CT CHEST, ABDOMEN, AND PELVIS WITH CONTRAST TECHNIQUE: Multidetector CT imaging of the chest, abdomen and pelvis was performed following the standard protocol during bolus administration of intravenous contrast.  RADIATION DOSE REDUCTION: This exam was performed according to the departmental dose-optimization program which includes automated exposure control, adjustment of the mA and/or kV according to patient size and/or use of iterative reconstruction technique. CONTRAST:  165m OMNIPAQUE IOHEXOL 350 MG/ML SOLN COMPARISON:  04/15/2022 FINDINGS: CT CHEST FINDINGS Cardiovascular: The heart is unremarkable without pericardial effusion. No evidence of thoracic aortic aneurysm or dissection. Atheromatous plaque within the descending thoracic aorta. Diffuse atherosclerosis of the aorta and coronary vasculature. Mediastinum/Nodes: Minimal gas within the anterior mediastinum consistent with recent surgical intervention. Enteric catheter extends into the gastric lumen. Thyroid and trachea are unremarkable. Previous Nissen fundoplication, with small hiatal hernia again identified. Loculated fluid along the right lateral aspect of the hiatal hernia sac, measuring 4.0 x 2.3 cm, decreased since prior study. There is intraluminal gas within the hiatal hernia at site of prior Nissen fundoplication. No extraluminal gas is seen within the lower mediastinum. Lungs/Pleura: Moderate bilateral pleural effusions, right greater than left. Dependent lower lobe atelectasis. Background emphysema. Right chest tube along the right mediastinal margin. No pneumothorax. Musculoskeletal: No acute or destructive bony lesions. Subcutaneous gas within the chest wall consistent with recent surgical intervention. Reconstructed images demonstrate no additional findings. CT ABDOMEN PELVIS FINDINGS Hepatobiliary: No focal liver abnormality is seen. No gallstones, gallbladder wall thickening, or biliary dilatation. Pancreas: Unremarkable. No pancreatic ductal dilatation or surrounding inflammatory changes. Spleen: Normal in size without focal abnormality. Adrenals/Urinary Tract: Adrenal glands are unremarkable. Kidneys are normal, without renal calculi, focal  lesion, or hydronephrosis. Bladder is decompressed with a Foley catheter. Stomach/Bowel: No bowel obstruction or ileus. Diffuse colonic diverticulosis without evidence of diverticulitis. Normal appendix right lower quadrant. Previous Nissen fundoplication, with small hiatal hernia again noted. Enteric catheter extends into the gastric lumen. There is a percutaneous jejunostomy tube, tip within the right mid abdomen within the mid jejunum. Vascular/Lymphatic: Aortic atherosclerosis. No enlarged abdominal or pelvic lymph nodes. Reproductive: Uterus and bilateral adnexa are unremarkable. Other: Surgical drain enters the abdomen in the left paraumbilical region, and extends superiorly through the sub hepatic region with tip residing at the diaphragmatic hiatus. There is no free fluid or free intraperitoneal gas. No abdominal wall hernia. Musculoskeletal: No acute or destructive bony lesions. Postsurgical changes right hip. Reconstructed images demonstrate no additional findings. IMPRESSION: 1. Postsurgical  changes related to interval esophageal perforation repair. Surgical drain tip at the gastroesophageal junction at the diaphragmatic hiatus, with 4.0 x 2.3 cm residual fluid collection along the right lateral margin of the distal esophagus. No extraluminal gas to suggest persistent or recurrent perforation. 2. Moderate bilateral pleural effusions and dependent lower lobe atelectasis, right greater than left. Right chest tube in place, with no residual or recurrent pneumothorax. 3. Enteric catheter and percutaneous jejunostomy tube as above. 4. Colonic diverticulosis without diverticulitis. 5.  Aortic Atherosclerosis (ICD10-I70.0). Electronically Signed   By: Randa Ngo M.D.   On: 04/18/2022 17:45   DG Chest Port 1 View  Result Date: 04/18/2022 CLINICAL DATA:  Short of breath, status post repair of paraesophageal hernia EXAM: PORTABLE CHEST 1 VIEW COMPARISON:  Prior chest x-ray 04/16/2022 FINDINGS: Right-sided  pigtail thoracostomy tube in unchanged position. Gastric tube remains present. The proximal side-hole overlies the upper stomach. A surgical drain is present overlying the mid epigastrium. Stable cardiomegaly. Atherosclerotic calcifications in the transverse aorta. Increasing right-sided pleural effusion with a somewhat loculated appearance. Pulmonary vascular congestion with mild interstitial edema. Persistent dense left basilar airspace opacity. IMPRESSION: 1. Enlarging loculated right pleural effusion despite the presence of the pigtail thoracostomy tube. 2. Cardiomegaly with mild pulmonary edema. 3. Unchanged support apparatus. Electronically Signed   By: Jacqulynn Cadet M.D.   On: 04/18/2022 08:46    Cardiac Studies   Echocardiogram was done today and was personally reviewed by me.  It showed an EF of 55 to 60% with moderate pulmonary hypertension and mild mitral regurgitation  Patient Profile     82 y.o. female with a hx of hypertension, COPD, paraesophageal hernia status postrepair, B12 deficiency who is being seen today for the evaluation of SVT at the request of [facility].   Assessment & Plan    1.  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  This is in the setting of complications related to esophageal perforation post robotic assisted laparoscopic repair of paraesophageal hernia. I agree that IV amiodarone is the best option at this time given that she is NPO.  Once she is able to take oral medications, can consider stopping IV amiodarone and switching to oral metoprolol.  No indication for anticoagulation. Echocardiogram today showed normal LV systolic function although there was mild mitral regurgitation moderate pulmonary hypertension.  Currently with no active cardiac symptoms.       For questions or updates, please contact [place] Please consult [email or web address] for contact info under        Signed, Kathlyn Sacramento, MD  04/19/2022, 6:37 PM

## 2022-04-19 NOTE — Progress Notes (Signed)
*  PRELIMINARY RESULTS* Echocardiogram 2D Echocardiogram has been performed.  Kelly Wood 04/19/2022, 9:25 AM

## 2022-04-19 NOTE — Progress Notes (Signed)
Patient clinically stable post CT IR placement, vitals stable pre and post procedure. Received Fentanyl 25 mcg IV . Report given at bedside to [facility] ICU 4. Family member at bedside with update given.

## 2022-04-19 NOTE — Progress Notes (Signed)
Nutrition Follow-up  DOCUMENTATION CODES:   Not applicable  INTERVENTION:   Osmolite 1.2'@60ml'$ /hr- Initiate at 60m/hr and increase by 152mhr q 8 hours until goal rate is reached.   Free water flushes 3055m4 hours to maintain tube patency   Regimen provides 1728kcal/day, 80g/day protein and 1277m21my of free water.   Pt at high refeed risk; recommend monitor potassium, magnesium and phosphorus labs daily until stable  Daily weights   NUTRITION DIAGNOSIS:   Increased nutrient needs related to post-op healing as evidenced by estimated needs.  GOAL:   Patient will meet greater than or equal to 90% of their needs -not met   MONITOR:   Diet advancement, Labs, Weight trends, TF tolerance, I & O's, Skin  ASSESSMENT:   81 y65 female with h/o GERD, HTN, shingles, thyroid disease, HTN, hiatal hernia s/p Nissen fundiplocation and paraesophageal hernia repair 04/07/2021, COPD, B12 deficiency, IDA, hip fracture s/p repair 2020 and lumbar spondlyosis who is admitted with recurrent hiatal hernia s/p robotic assisted laparoscopic repair of  paraesophageal hernia with Bio-A Mesh and Nissen fundoplication, redo 1/116/65plicated by esophageal perforation now s/p robotic assisted primary repair of esophageal perforation 1/11 (with chest tube placement, jejunostomy tube placement, blake drain placement, removal of BioA mesh and omental flap).  Pt tolerating trickle tube feeds at 10ml46myesterday. Tube feeds on hold currently for planned IR guided replacement of chest tube today for right pleural effusion. Will plan to resume tube feeds via J-tube after procedure and advance to goal rate as tolerated. Pt remains at high refeed risk. OGT in place to LIS with minimal output. Minimal output noted from JP drain. Per chart, pt is up ~20lbs from her UBW. Pt  +2.8L on her I & Os.    Medications reviewed and include: lovenox, synthroid, protonix, zosyn   Labs reviewed: K 4.0 wnl Wbc- 17.4(H), Hgb 10.7(L),  Hct 33.9(L) Cbgs- 115, 104 x 24 hrs   Diet Order:   Diet Order             Diet NPO time specified Except for: Other (See Comments)  Diet effective now                  EDUCATION NEEDS:   Education needs have been addressed  Skin:  Incisions: closed abdomen, s/p j-tube placement  Last BM:  1/14- type 7  Height:   Ht Readings from Last 1 Encounters:  04/15/22 5' (1.524 m)    Weight:   Wt Readings from Last 1 Encounters:  04/18/22 62.5 kg    Ideal Body Weight:  45.5 kg  BMI:  Body mass index is 26.91 kg/m.  Estimated Nutritional Needs:   Kcal:  1600-1800kcal/day  Protein:  80-90g/day  Fluid:  1.4-1.6L/day  CaseyKoleen DistanceRD, LDN Please refer to AMIONEn[facility] Rehabilitation HospitalRD and/or RD on-call/weekend/after hours pager

## 2022-04-19 NOTE — Progress Notes (Signed)
OT Cancellation Note  Patient Details Name: Loleta Frommelt MRN: [id number] DOB: 07-13-1940   Cancelled Treatment:    Reason Eval/Treat Not Completed: Patient not medically ready. Per chart review, pt noted to have transferred to higher level of care (ICU). Per protocol, will require new OT orders before seeing pt again. Will complete current orders. Will f/u once orders are received and as pt medically appropriate.   [name] 04/19/2022, 7:52 AM

## 2022-04-19 NOTE — Progress Notes (Signed)
PROGRESS NOTE   Kelly Wood  DPO:[id number]    DOB: May 09, 1940    DOA: 04/15/2022  PCP: Idelle Crouch, MD   I have briefly reviewed patients previous medical records in [facility].  No chief complaint on file.   Brief Narrative:  82 year old married female, PMH of HTN, HLD, hypothyroid, COPD, anemia, GERD, s/p repair of paraesophageal hernia robotically approximately 11 months ago, presented to ED on 04/15/2022 with persistent bloating, eructation and chest discomfort.  She was diagnosed with symptomatic recurrent paraesophageal hernia.  Admitted by the surgical team.  She underwent robotic assisted laparoscopic repair of paraesophageal hernia with bio mesh and Nissen fundoplication, redo on 4/31 which was complicated by esophageal perforation, right pneumothorax requiring right chest tube placement, robotic assisted repair of esophageal perforation, jejunostomy tube, mediastinal Blake drain placement.  TRH consulted 1/12 for management of medical issues.  1/14: New onset narrow complex tachycardia, SVT, transferred to stepdown.  Cardiology consulted.  Started on IV amiodarone.   Assessment & Plan:  Principal Problem:   S/P repair of paraesophageal hernia Active Problems:   Esophageal perforation  SVT Developed 1/14 morning.  Rates up to 160s.  Did not see prior history of A-fib, a flutter or SVT.  Per patient and spouse, told to have "tachycardia" without specification.  Not on anticoagulation.  Home dose of Cardizem CD to 40 mg daily and Ziac (bisoprolol 5 Mg) on hold since admission due to n.p.o.  Blood pressure was being managed with metoprolol 5 Mg IV every 6 hours.  Transferred to stepdown. TSH: 6.278, free T40.99, suspect sick euthyroid and recommend repeating TFTs in 4 weeks and continue current dose of Synthroid when able.  Cardiology consulted and their input appreciated.  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  She was  started on IV amiodarone drip with return to sinus rhythm.  Continue management.  Await cardiology follow-up.  Delirium: For the last 2 evenings, patient appeared to get confused and anxious.  Had paradoxical response reportedly to IV Ativan yesterday.  Avoid benzodiazepines.  Delirium precautions.  Minimize opioids  Essential hypertension: PTA on bisoprolol-HCTZ daily, Cardizem CD 240 mg daily.  Continue IV metoprolol 5 Mg every 6 hours.  As needed IV hydralazine.  Mildly uncontrolled.  Hypothyroidism: Last dose of Synthroid 75 mg daily was on 1/11 and can hold it for up to 7 days prior to considering IV if needed.  If prolonged n.p.o. and no tablets/pills via J-tube, consider starting IV Synthroid in the next day or 2.  Hyperlipidemia: On Pravachol 80 mg daily PTA.  Mildly elevated AST and ALT, likely due to tube feeds, hold off on starting statins at this time.  Resume when able.  LFTs slightly better.  Hypokalemia: Replaced.  Magnesium 1.9.  Anemia: Likely multifactorial.  Stable.  S/p robotic assisted laparoscopic paraesophageal hernia repair 5/40, complicated by esophageal leak requiring immediate takeback for robotic assisted primary repair of esophageal perforation, placement of mediastinal drain/right chest tube/feeding jejunostomy tube: Management per primary service.  Will remain n.p.o. x 5 days, enteral feeding via jejunostomy tube, no pills via jejunostomy tube, liquids only (confirmed with surgical team).  IV Zosyn.  Multimodality pain control.  Right-sided chest tube changed by IR on 1/15  Mild transaminitis: ?  Related to tube feeds.  Follow CMP daily.  Statins on hold.  Resolved.  Leukocytosis: WBC up to 17 K.  Remains on IV Zosyn.  Continue.  Trend daily CBC.   Body  mass index is 26.91 kg/m.  Nutritional Status Nutrition Problem: Increased nutrient needs Etiology: post-op healing Signs/Symptoms: estimated needs Interventions: Tube feeding, Prostat  Pressure  Ulcer:     DVT prophylaxis: enoxaparin (LOVENOX) injection 40 mg Start: 04/16/22 0800 SCDs Start: 04/15/22 1132     Code Status: Full Code:  Family Communication: Spouse at bedside. Disposition:  Inpatient appropriate due to ongoing IV antibiotics, IV amiodarone, n.p.o., G-tube feeds etc.     Consultants:   [facility]. Cardiology  Procedures:   As above  Antimicrobials:   IV Zosyn   Subjective:  Overnight events noted.  Discussed with patient's RN and spouse at bedside.  Reportedly got worsening confusion and agitation after Ativan dose last night, improved early this morning.  Currently mental status back to baseline.  Patient reports that she feels worn out, has no recollection of last night's events and has no new complaints.  No pain reported.  Objective:   Vitals:   04/19/22 1612 04/19/22 1625 04/19/22 1629 04/19/22 1636  BP: (!) 172/99 (!) 167/84 (!) 163/79 (!) 159/87  Pulse: 88 92    Resp: 18 (!) 27 (!) 21 20  Temp:      TempSrc:      SpO2: 98% 96% 96% 96%  Weight:      Height:        General exam: Elderly female, small built and frail lying comfort bili propped up in bed.  Looks much improved compared to yesterday.  Echo tech performing bedside echo. Respiratory system: Reduced breath sounds in the bases, right greater than sign left.  Otherwise clear to auscultation without wheezing or rhonchi.  No increased work of breathing.  Has NG tube.  Right-sided chest tube.  Not much change since yesterday.  She was seen prior to change of chest tube. Cardiovascular system: S1 & S2 heard, regular tachycardia. No JVD, murmurs, rubs, gallops or clicks. No pedal edema.  Telemetry personally reviewed: Has been mostly in sinus rhythm since 1/14 around 4 PM. Gastrointestinal system: Abdomen is nondistended, soft and nontender. No organomegaly or masses felt. Normal bowel sounds heard.  Gastrostomy tube site without acute findings. Central nervous system: Alert and  oriented. No focal neurological deficits.  May be somewhat hard of hearing. Extremities: Symmetric 5 x 5 power. Skin: No rashes, lesions or ulcers Psychiatry: Judgement and insight appear somewhat impaired. Mood & affect appropriate.     Data Reviewed:   I have personally reviewed following labs and imaging studies   CBC: Recent Labs  Lab 04/15/22 1644 04/17/22 0632 04/19/22 0413  WBC 11.4* 15.3* 17.4*  HGB 11.4* 9.8* 10.7*  HCT 36.2 31.2* 33.9*  MCV 86.4 86.7 86.0  PLT 283 254 570    Basic Metabolic Panel: Recent Labs  Lab 04/15/22 1644 04/17/22 0632 04/18/22 0616 04/19/22 0413  NA  --  134* 135 136  K  --  3.3* 3.6 4.0  CL  --  103 106 103  CO2  --  '24 24 25  '$ GLUCOSE  --  131* 149* 118*  BUN  --  '21 15 15  '$ CREATININE 0.66 0.73 0.50 0.63  CALCIUM  --  7.5* 7.4* 7.7*  MG  --  1.8 1.9  --   PHOS  --  2.9  --   --     Liver Function Tests: Recent Labs  Lab 04/17/22 0632 04/18/22 0616 04/19/22 0413  AST 110* 61* 31  ALT 87* 78* 54*  ALKPHOS 63 111 122  BILITOT 0.2*  0.5 0.5  PROT 5.3* 5.5* 5.8*  ALBUMIN 2.3* 2.2* 2.2*    CBG: Recent Labs  Lab 04/19/22 0324 04/19/22 0724 04/19/22 1125  GLUCAP 104* 115* 103*    Microbiology Studies:   Recent Results (from the past 240 hour(s))  MRSA Next Gen by PCR, Nasal     Status: None   Collection Time: 04/18/22  9:03 AM   Specimen: Nasal Mucosa; Nasal Swab  Result Value Ref Range Status   MRSA by PCR Next Gen NOT DETECTED NOT DETECTED Final    Comment: (NOTE) The GeneXpert MRSA Assay (FDA approved for NASAL specimens only), is one component of a comprehensive MRSA colonization surveillance program. It is not intended to diagnose MRSA infection nor to guide or monitor treatment for MRSA infections. Test performance is not FDA approved in patients less than 52 years old. Performed at [facility], [address], [place], [place] [address]     Radiology Studies:  ECHOCARDIOGRAM  COMPLETE  Result Date: 04/19/2022    ECHOCARDIOGRAM REPORT   Patient Name:   SACHIKO METHOT [facility] Date of Exam: 04/19/2022 Medical Rec #:  [id number]       Height:       60.0 in Accession #:    [id number]      Weight:       137.8 lb Date of Birth:  08-18-1940       BSA:          1.593 m Patient Age:    61 years        BP:           136/77 mmHg Patient Gender: F               HR:           98 bpm. Exam Location:  [facility] Procedure: 2D Echo, Cardiac Doppler and Color Doppler Indications:     Atrial Flutter I48.92  History:         Patient has no prior history of Echocardiogram examinations.                  COPD; Risk Factors:Hypertension.  Sonographer:     Sherrie Sport Referring Phys:  [place] Diagnosing Phys: Kathlyn Sacramento MD  Sonographer Comments: Technically difficult study due to poor echo windows. IMPRESSIONS  1. Left ventricular ejection fraction, by estimation, is 55 to 60%. The left ventricle has normal function. Left ventricular endocardial border not optimally defined to evaluate regional wall motion. Left ventricular diastolic parameters are consistent with Grade I diastolic dysfunction (impaired relaxation).  2. Right ventricular systolic function is normal. The right ventricular size is normal. There is moderately elevated pulmonary artery systolic pressure. The estimated right ventricular systolic pressure is 31.4 mmHg.  3. Left atrial size was mildly dilated.  4. The mitral valve is normal in structure. Mild mitral valve regurgitation. No evidence of mitral stenosis. Moderate mitral annular calcification.  5. The aortic valve is normal in structure. Aortic valve regurgitation is not visualized. Aortic valve sclerosis is present, with no evidence of aortic valve stenosis. FINDINGS  Left Ventricle: Left ventricular ejection fraction, by estimation, is 55 to 60%. The left ventricle has normal function. Left ventricular endocardial border not optimally defined to evaluate regional wall motion. The  left ventricular internal cavity size was normal in size. There is no left ventricular hypertrophy. Left ventricular diastolic parameters are consistent with Grade I diastolic dysfunction (impaired relaxation). Right Ventricle: The right ventricular size is  normal. No increase in right ventricular wall thickness. Right ventricular systolic function is normal. There is moderately elevated pulmonary artery systolic pressure. The tricuspid regurgitant velocity is 3.46 m/s, and with an assumed right atrial pressure of 5 mmHg, the estimated right ventricular systolic pressure is 62.9 mmHg. Left Atrium: Left atrial size was mildly dilated. Right Atrium: Right atrial size was normal in size. Pericardium: There is no evidence of pericardial effusion. Mitral Valve: The mitral valve is normal in structure. Moderate mitral annular calcification. Mild mitral valve regurgitation. No evidence of mitral valve stenosis. Tricuspid Valve: The tricuspid valve is normal in structure. Tricuspid valve regurgitation is mild . No evidence of tricuspid stenosis. Aortic Valve: The aortic valve is normal in structure. Aortic valve regurgitation is not visualized. Aortic valve sclerosis is present, with no evidence of aortic valve stenosis. Aortic valve mean gradient measures 2.5 mmHg. Aortic valve peak gradient measures 4.8 mmHg. Aortic valve area, by VTI measures 3.79 cm. Pulmonic Valve: The pulmonic valve was normal in structure. Pulmonic valve regurgitation is not visualized. No evidence of pulmonic stenosis. Aorta: The aortic root is normal in size and structure. Venous: The inferior vena cava was not well visualized. IAS/Shunts: No atrial level shunt detected by color flow Doppler.  LEFT VENTRICLE PLAX 2D LVIDd:         4.10 cm   Diastology LVIDs:         3.00 cm   LV e' medial:    4.79 cm/s LV PW:         1.00 cm   LV E/e' medial:  11.4 LV IVS:        1.00 cm   LV e' lateral:   6.64 cm/s LVOT diam:     2.00 cm   LV E/e' lateral: 8.2 LV  SV:         66 LV SV Index:   42 LVOT Area:     3.14 cm  RIGHT VENTRICLE RV Basal diam:  3.40 cm RV Mid diam:    3.10 cm RV S prime:     17.80 cm/s TAPSE (M-mode): 2.4 cm LEFT ATRIUM             Index        RIGHT ATRIUM           Index LA diam:        3.80 cm 2.39 cm/m   RA Area:     11.20 cm LA Vol (A2C):   51.5 ml 32.32 ml/m  RA Volume:   20.60 ml  12.93 ml/m LA Vol (A4C):   41.8 ml 26.24 ml/m LA Biplane Vol: 50.5 ml 31.70 ml/m  AORTIC VALVE AV Area (Vmax):    3.03 cm AV Area (Vmean):   3.10 cm AV Area (VTI):     3.79 cm AV Vmax:           110.00 cm/s AV Vmean:          73.300 cm/s AV VTI:            0.175 m AV Peak Grad:      4.8 mmHg AV Mean Grad:      2.5 mmHg LVOT Vmax:         106.00 cm/s LVOT Vmean:        72.400 cm/s LVOT VTI:          0.211 m LVOT/AV VTI ratio: 1.21  AORTA Ao Root diam: 2.70 cm MITRAL VALVE  TRICUSPID VALVE MV Area (PHT): 6.37 cm    TR Peak grad:   47.9 mmHg MV Decel Time: 119 msec    TR Vmax:        346.00 cm/s MV E velocity: 54.50 cm/s MV A velocity: 94.90 cm/s  SHUNTS MV E/A ratio:  0.57        Systemic VTI:  0.21 m                            Systemic Diam: 2.00 cm Kathlyn Sacramento MD Electronically signed by Kathlyn Sacramento MD Signature Date/Time: 04/19/2022/1:40:45 PM    Final    CT CHEST ABDOMEN PELVIS W CONTRAST  Result Date: 04/18/2022 CLINICAL DATA:  Paraesophageal hernia repair complicated by perforation EXAM: CT CHEST, ABDOMEN, AND PELVIS WITH CONTRAST TECHNIQUE: Multidetector CT imaging of the chest, abdomen and pelvis was performed following the standard protocol during bolus administration of intravenous contrast. RADIATION DOSE REDUCTION: This exam was performed according to the departmental dose-optimization program which includes automated exposure control, adjustment of the mA and/or kV according to patient size and/or use of iterative reconstruction technique. CONTRAST:  164m OMNIPAQUE IOHEXOL 350 MG/ML SOLN COMPARISON:  04/15/2022 FINDINGS: CT  CHEST FINDINGS Cardiovascular: The heart is unremarkable without pericardial effusion. No evidence of thoracic aortic aneurysm or dissection. Atheromatous plaque within the descending thoracic aorta. Diffuse atherosclerosis of the aorta and coronary vasculature. Mediastinum/Nodes: Minimal gas within the anterior mediastinum consistent with recent surgical intervention. Enteric catheter extends into the gastric lumen. Thyroid and trachea are unremarkable. Previous Nissen fundoplication, with small hiatal hernia again identified. Loculated fluid along the right lateral aspect of the hiatal hernia sac, measuring 4.0 x 2.3 cm, decreased since prior study. There is intraluminal gas within the hiatal hernia at site of prior Nissen fundoplication. No extraluminal gas is seen within the lower mediastinum. Lungs/Pleura: Moderate bilateral pleural effusions, right greater than left. Dependent lower lobe atelectasis. Background emphysema. Right chest tube along the right mediastinal margin. No pneumothorax. Musculoskeletal: No acute or destructive bony lesions. Subcutaneous gas within the chest wall consistent with recent surgical intervention. Reconstructed images demonstrate no additional findings. CT ABDOMEN PELVIS FINDINGS Hepatobiliary: No focal liver abnormality is seen. No gallstones, gallbladder wall thickening, or biliary dilatation. Pancreas: Unremarkable. No pancreatic ductal dilatation or surrounding inflammatory changes. Spleen: Normal in size without focal abnormality. Adrenals/Urinary Tract: Adrenal glands are unremarkable. Kidneys are normal, without renal calculi, focal lesion, or hydronephrosis. Bladder is decompressed with a Foley catheter. Stomach/Bowel: No bowel obstruction or ileus. Diffuse colonic diverticulosis without evidence of diverticulitis. Normal appendix right lower quadrant. Previous Nissen fundoplication, with small hiatal hernia again noted. Enteric catheter extends into the gastric lumen.  There is a percutaneous jejunostomy tube, tip within the right mid abdomen within the mid jejunum. Vascular/Lymphatic: Aortic atherosclerosis. No enlarged abdominal or pelvic lymph nodes. Reproductive: Uterus and bilateral adnexa are unremarkable. Other: Surgical drain enters the abdomen in the left paraumbilical region, and extends superiorly through the sub hepatic region with tip residing at the diaphragmatic hiatus. There is no free fluid or free intraperitoneal gas. No abdominal wall hernia. Musculoskeletal: No acute or destructive bony lesions. Postsurgical changes right hip. Reconstructed images demonstrate no additional findings. IMPRESSION: 1. Postsurgical changes related to interval esophageal perforation repair. Surgical drain tip at the gastroesophageal junction at the diaphragmatic hiatus, with 4.0 x 2.3 cm residual fluid collection along the right lateral margin of the distal esophagus. No extraluminal gas to suggest persistent or  recurrent perforation. 2. Moderate bilateral pleural effusions and dependent lower lobe atelectasis, right greater than left. Right chest tube in place, with no residual or recurrent pneumothorax. 3. Enteric catheter and percutaneous jejunostomy tube as above. 4. Colonic diverticulosis without diverticulitis. 5.  Aortic Atherosclerosis (ICD10-I70.0). Electronically Signed   By: Randa Ngo M.D.   On: 04/18/2022 17:45   DG Chest Port 1 View  Result Date: 04/18/2022 CLINICAL DATA:  Short of breath, status post repair of paraesophageal hernia EXAM: PORTABLE CHEST 1 VIEW COMPARISON:  Prior chest x-ray 04/16/2022 FINDINGS: Right-sided pigtail thoracostomy tube in unchanged position. Gastric tube remains present. The proximal side-hole overlies the upper stomach. A surgical drain is present overlying the mid epigastrium. Stable cardiomegaly. Atherosclerotic calcifications in the transverse aorta. Increasing right-sided pleural effusion with a somewhat loculated appearance.  Pulmonary vascular congestion with mild interstitial edema. Persistent dense left basilar airspace opacity. IMPRESSION: 1. Enlarging loculated right pleural effusion despite the presence of the pigtail thoracostomy tube. 2. Cardiomegaly with mild pulmonary edema. 3. Unchanged support apparatus. Electronically Signed   By: Jacqulynn Cadet M.D.   On: 04/18/2022 08:46    Scheduled Meds:    Chlorhexidine Gluconate Cloth  6 each Topical Daily   Chlorhexidine Gluconate Cloth  6 each Topical Q0600   enoxaparin (LOVENOX) injection  40 mg Subcutaneous Q24H   fentaNYL       free water  30 mL Per Tube Q4H   ketorolac  15 mg Intravenous Q6H   levothyroxine  75 mcg Per Tube QAC breakfast   metoprolol tartrate  5 mg Intravenous Q6H   pantoprazole (PROTONIX) IV  40 mg Intravenous Q12H    Continuous Infusions:    amiodarone 30 mg/hr (04/19/22 1508)   feeding supplement (OSMOLITE 1.2 CAL)     piperacillin-tazobactam (ZOSYN)  IV 3.375 g (04/19/22 1408)     LOS: 3 days   Critical care time spent: 40 minutes  Vernell Leep, MD,  FACP, FHM, [facility], [facility], [place]     To contact the attending provider between 7A-7P or the covering provider during after hours 7P-7A, please log into the web site [email or web address] and access using universal [place] password for that web site. If you do not have the password, please call the hospital operator.  04/19/2022, 4:50 PM

## 2022-04-19 NOTE — Progress Notes (Signed)
82 y.o. female 3 Days Post-Op s/p robotic assisted laparoscopic paraesophageal hernia repair complicated by esophageal leak requiring immediate take back for robotic assisted primary repair of esophageal perforation, placement of mediastinal drain, feeding jejunostomy placement, and right chest tube placements.  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 Patient received Ativan and was noted with paradoxical reaction, medication has since been discontinued. Remains hemodynamically stable not requiring pressors, on oxygen via nasal cannula, protecting her airway with no other critical care needs at this time. Hospitalist has been consulted and following medical issues.    Rufina Falco, DNP, CCRN, FNP-C, AGACNP-BC Acute Care & Family Nurse Practitioner  [facility]  See Amion for personal pager PCCM on call pager [phone] until 7 am

## 2022-04-20 ENCOUNTER — Inpatient Hospital Stay: Payer: Medicare HMO

## 2022-04-20 DIAGNOSIS — Z9889 Other specified postprocedural states: Secondary | ICD-10-CM | POA: Diagnosis not present

## 2022-04-20 DIAGNOSIS — D72829 Elevated white blood cell count, unspecified: Secondary | ICD-10-CM | POA: Diagnosis not present

## 2022-04-20 DIAGNOSIS — I471 Supraventricular tachycardia, unspecified: Secondary | ICD-10-CM | POA: Diagnosis not present

## 2022-04-20 DIAGNOSIS — Z8719 Personal history of other diseases of the digestive system: Secondary | ICD-10-CM | POA: Diagnosis not present

## 2022-04-20 DIAGNOSIS — E876 Hypokalemia: Secondary | ICD-10-CM | POA: Diagnosis not present

## 2022-04-20 LAB — GLUCOSE, CAPILLARY
Glucose-Capillary: 136 mg/dL — ABNORMAL HIGH (ref 70–99)
Glucose-Capillary: 125 mg/dL — ABNORMAL HIGH (ref 70–99)
Glucose-Capillary: 116 mg/dL — ABNORMAL HIGH (ref 70–99)
Glucose-Capillary: 100 mg/dL — ABNORMAL HIGH (ref 70–99)
Glucose-Capillary: 140 mg/dL — ABNORMAL HIGH (ref 70–99)
Glucose-Capillary: 129 mg/dL — ABNORMAL HIGH (ref 70–99)

## 2022-04-20 LAB — CBC
HCT: 30.8 % — ABNORMAL LOW (ref 36.0–46.0)
Hemoglobin: 10.1 g/dL — ABNORMAL LOW (ref 12.0–15.0)
MCH: 27.2 pg (ref 26.0–34.0)
MCHC: 32.8 g/dL (ref 30.0–36.0)
MCV: 83 fL (ref 80.0–100.0)
Platelets: 315 10*3/uL (ref 150–400)
RBC: 3.71 MIL/uL — ABNORMAL LOW (ref 3.87–5.11)
RDW: 14.7 % (ref 11.5–15.5)
WBC: 11.3 10*3/uL — ABNORMAL HIGH (ref 4.0–10.5)
nRBC: 0 % (ref 0.0–0.2)

## 2022-04-20 LAB — BASIC METABOLIC PANEL
Anion gap: 11 (ref 5–15)
BUN: 20 mg/dL (ref 8–23)
CO2: 22 mmol/L (ref 22–32)
Calcium: 7.6 mg/dL — ABNORMAL LOW (ref 8.9–10.3)
Chloride: 100 mmol/L (ref 98–111)
Creatinine, Ser: 0.58 mg/dL (ref 0.44–1.00)
GFR, Estimated: >60 mL/min (ref >60)
Glucose, Bld: 134 mg/dL — ABNORMAL HIGH (ref 70–99)
Potassium: 3.4 mmol/L — ABNORMAL LOW (ref 3.5–5.1)
Sodium: 133 mmol/L — ABNORMAL LOW (ref 135–145)

## 2022-04-20 LAB — MAGNESIUM: Magnesium: 1.9 mg/dL (ref 1.7–2.4)

## 2022-04-20 LAB — PHOSPHORUS: Phosphorus: 4.3 mg/dL (ref 2.5–4.6)

## 2022-04-20 MED ORDER — LEVOTHYROXINE SODIUM 100 MCG/5ML IV SOLN
37.5000 ug | Freq: Every day | INTRAVENOUS | Status: DC
  Filled 2022-04-20: qty 5

## 2022-04-20 MED ORDER — ACETAMINOPHEN 10 MG/ML IV SOLN
1000.0000 mg | Freq: Four times a day (QID) | INTRAVENOUS | Status: AC
  Administered 2022-04-20 – 2022-04-21 (×4): 1000 mg via INTRAVENOUS
  Filled 2022-04-20 (×5): qty 100

## 2022-04-20 MED ORDER — POTASSIUM CHLORIDE 20 MEQ PO PACK
40.0000 meq | PACK | Freq: Once | ORAL | Status: AC
  Administered 2022-04-20: 40 meq
  Filled 2022-04-20: qty 2

## 2022-04-20 MED ORDER — LEVOTHYROXINE SODIUM 100 MCG/5ML IV SOLN
50.0000 ug | Freq: Every day | INTRAVENOUS | Status: DC
  Administered 2022-04-21 – 2022-04-23 (×3): 50 ug via INTRAVENOUS
  Filled 2022-04-20 (×4): qty 5

## 2022-04-20 MED ORDER — FUROSEMIDE 10 MG/ML IJ SOLN
20.0000 mg | Freq: Once | INTRAMUSCULAR | Status: AC
  Administered 2022-04-20: 20 mg via INTRAVENOUS
  Filled 2022-04-20: qty 2

## 2022-04-20 NOTE — Progress Notes (Signed)
Rounding Note    Patient Name: Kelly Wood Date of Encounter: 04/20/2022  [place] Cardiologist: None  Subjective   No chest pain, shortness of breath, or palpitations.  Patient is still n.p.o. with plans for evaluation of esophageal hernia repair/leak tomorrow.  Inpatient Medications    Scheduled Meds:  Chlorhexidine Gluconate Cloth  6 each Topical Daily   Chlorhexidine Gluconate Cloth  6 each Topical Q0600   enoxaparin (LOVENOX) injection  40 mg Subcutaneous Q24H   free water  30 mL Per Tube Q4H   levothyroxine  50 mcg Intravenous Daily   metoprolol tartrate  5 mg Intravenous Q6H   pantoprazole (PROTONIX) IV  40 mg Intravenous Q12H   Continuous Infusions:  acetaminophen Stopped (04/20/22 1056)   amiodarone 30 mg/hr (04/20/22 1318)   feeding supplement (OSMOLITE 1.2 CAL) 60 mL/hr at 04/20/22 1104   piperacillin-tazobactam (ZOSYN)  IV 3.375 g (04/20/22 1339)   PRN Meds: albuterol, [DISCONTINUED] diphenhydrAMINE **OR** diphenhydrAMINE, hydrALAZINE, menthol-cetylpyridinium, metoprolol tartrate, morphine injection, naphazoline-glycerin, oxyCODONE, phenol   Vital Signs    Vitals:   04/20/22 0900 04/20/22 1000 04/20/22 1100 04/20/22 1222  BP: (!) 149/89 137/87 (!) 143/79 (!) 159/93  Pulse: 86 76 78 81  Resp: '19 18 19 18  '$ Temp:    97.7 F (36.5 C)  TempSrc:    Oral  SpO2: 95% 96% 95% 99%  Weight:      Height:        Intake/Output Summary (Last 24 hours) at 04/20/2022 1507 Last data filed at 04/20/2022 1339 Gross per 24 hour  Intake 2801.14 ml  Output 1790 ml  Net 1011.14 ml      04/20/2022    3:34 AM 04/18/2022    5:00 AM 04/17/2022    4:27 AM  Last 3 Weights  Weight (lbs) 137 lb 12.6 oz 137 lb 12.6 oz 138 lb 10.7 oz  Weight (kg) 62.5 kg 62.5 kg 62.9 kg      Telemetry    Sinus rhythm with PACs - Personally Reviewed  ECG    No new tracing  Physical Exam   GEN: No acute distress.   Neck: No JVD Cardiac: RRR, no murmurs, rubs, or  gallops.  Respiratory: Mildly diminished breath sounds throughout without wheezes or crackles. GI: Soft, nontender, non-distended  MS: No edema; No deformity. Neuro:  Nonfocal  Psych: Normal affect   Labs    High Sensitivity Troponin:  No results for input(s): "TROPONINIHS" in the last 720 hours.   Chemistry Recent Labs  Lab 04/17/22 [phone] 04/18/22 0616 04/19/22 0413 04/20/22 0323  NA 134* 135 136 133*  K 3.3* 3.6 4.0 3.4*  CL 103 106 103 100  CO2 '24 24 25 22  '$ GLUCOSE 131* 149* 118* 134*  BUN '21 15 15 20  '$ CREATININE 0.73 0.50 0.63 0.58  CALCIUM 7.5* 7.4* 7.7* 7.6*  MG 1.8 1.9  --  1.9  PROT 5.3* 5.5* 5.8*  --   ALBUMIN 2.3* 2.2* 2.2*  --   AST 110* 61* 31  --   ALT 87* 78* 54*  --   ALKPHOS 63 111 122  --   BILITOT 0.2* 0.5 0.5  --   GFRNONAA >60 >60 >60 >60  ANIONGAP '7 5 8 11    '$ Lipids No results for input(s): "CHOL", "TRIG", "HDL", "LABVLDL", "LDLCALC", "CHOLHDL" in the last 168 hours.  Hematology Recent Labs  Lab 04/17/22 0632 04/19/22 0413 04/20/22 0323  WBC 15.3* 17.4* 11.3*  RBC 3.60* 3.94 3.71*  HGB 9.8* 10.7* 10.1*  HCT 31.2* 33.9* 30.8*  MCV 86.7 86.0 83.0  MCH 27.2 27.2 27.2  MCHC 31.4 31.6 32.8  RDW 14.1 14.4 14.7  PLT 254 323 315   Thyroid  Recent Labs  Lab 04/18/22 0616  TSH 6.278*  FREET4 0.99    BNPNo results for input(s): "BNP", "PROBNP" in the last 168 hours.  DDimer No results for input(s): "DDIMER" in the last 168 hours.   Radiology    IR [facility] PLEURAL DRAIN W/INDWELL CATH W/IMG GUIDE  Result Date: 04/20/2022 INDICATION: 82 year old woman with right hydropneumothorax related to esophageal leak. Patient has a right chest tube in place which terminates anteriorly. It has resolved the pneumothorax, however fluid remains within the dependent portion of the chest. IR consulted for removal of right anterior chest tube and placement of right posterior chest tube for drainage of remaining fluid. EXAM: 1. Ultrasound-guided right chest tube  placement (14 [place]) 2. Right anterior chest tube removal. MEDICATIONS: 25 mcg IV fentanyl ANESTHESIA/SEDATION: None COMPLICATIONS: None immediate. PROCEDURE: Informed written consent was obtained from the patient after a thorough discussion of the procedural risks, benefits and alternatives. All questions were addressed. Maximal Sterile Barrier Technique was utilized including caps, mask, sterile gowns, sterile gloves, sterile drape, hand hygiene and skin antiseptic. A timeout was performed prior to the initiation of the procedure. Patient positioned supine on the procedure table. Ultrasound evaluation of the right chest demonstrated a small pleural effusion. The overlying skin was prepped and draped in sterile fashion. Following local lidocaine administration, the pleural space was accessed with a 19 gauge Yueh needle. Straw-colored fluid was seen returning from the needle hub. The Yueh needle was removed over 0.035 inch guidewire. Serial dilation was performed and 14 French pigtail catheter was inserted over the guidewire. The new drain was connected to pleura vac set at -20 cm H2O. The drain was secured to skin with suture and covered with sterile dressing. The right anterior chest tube was then removed without difficulty. The insertion site was covered with Vaseline gauze and sterile dressing. IMPRESSION: Successful ultrasound-guided insertion of right chest tube (14 [place]). Electronically Signed   By: Miachel Roux M.D.   On: 04/20/2022 08:55   DG Chest Port 1 View  Result Date: 04/19/2022 CLINICAL DATA:  Chest tube placement EXAM: PORTABLE CHEST 1 VIEW COMPARISON:  04/18/2022 FINDINGS: Single frontal view of the chest was performed. There has been replacement of the right-sided chest tube, with new pigtail drainage catheter coiled over the right lateral mid chest. Decreased right pleural effusion after chest tube repositioning. Minimal gas at the right costophrenic angle likely due to recent  intervention. Stable areas of bilateral consolidation and left pleural effusion. Enteric catheter unchanged. Cardiac silhouette remains enlarged. IMPRESSION: 1. Interval replacement of the right pigtail drainage catheter, with decreased right pleural effusion after catheter replacement. Trace amount of gas within the right pleural space likely due to recent intervention. 2. Stable bilateral consolidation and left pleural effusion. Electronically Signed   By: Randa Ngo M.D.   On: 04/19/2022 16:58   ECHOCARDIOGRAM COMPLETE  Result Date: 04/19/2022    ECHOCARDIOGRAM REPORT   Patient Name:   KALIMAH CAPURRO [facility] Date of Exam: 04/19/2022 Medical Rec #:  [id number]       Height:       60.0 in Accession #:    [id number]      Weight:       137.8 lb Date of Birth:  Oct 12, 1940  BSA:          1.593 m Patient Age:    82 years        BP:           136/77 mmHg Patient Gender: F               HR:           98 bpm. Exam Location:  [facility] Procedure: 2D Echo, Cardiac Doppler and Color Doppler Indications:     Atrial Flutter I48.92  History:         Patient has no prior history of Echocardiogram examinations.                  COPD; Risk Factors:Hypertension.  Sonographer:     Sherrie Sport Referring Phys:  [place] Diagnosing Phys: Kathlyn Sacramento MD  Sonographer Comments: Technically difficult study due to poor echo windows. IMPRESSIONS  1. Left ventricular ejection fraction, by estimation, is 55 to 60%. The left ventricle has normal function. Left ventricular endocardial border not optimally defined to evaluate regional wall motion. Left ventricular diastolic parameters are consistent with Grade I diastolic dysfunction (impaired relaxation).  2. Right ventricular systolic function is normal. The right ventricular size is normal. There is moderately elevated pulmonary artery systolic pressure. The estimated right ventricular systolic pressure is 56.3 mmHg.  3. Left atrial size was mildly dilated.  4. The mitral valve is  normal in structure. Mild mitral valve regurgitation. No evidence of mitral stenosis. Moderate mitral annular calcification.  5. The aortic valve is normal in structure. Aortic valve regurgitation is not visualized. Aortic valve sclerosis is present, with no evidence of aortic valve stenosis. FINDINGS  Left Ventricle: Left ventricular ejection fraction, by estimation, is 55 to 60%. The left ventricle has normal function. Left ventricular endocardial border not optimally defined to evaluate regional wall motion. The left ventricular internal cavity size was normal in size. There is no left ventricular hypertrophy. Left ventricular diastolic parameters are consistent with Grade I diastolic dysfunction (impaired relaxation). Right Ventricle: The right ventricular size is normal. No increase in right ventricular wall thickness. Right ventricular systolic function is normal. There is moderately elevated pulmonary artery systolic pressure. The tricuspid regurgitant velocity is 3.46 m/s, and with an assumed right atrial pressure of 5 mmHg, the estimated right ventricular systolic pressure is 14.9 mmHg. Left Atrium: Left atrial size was mildly dilated. Right Atrium: Right atrial size was normal in size. Pericardium: There is no evidence of pericardial effusion. Mitral Valve: The mitral valve is normal in structure. Moderate mitral annular calcification. Mild mitral valve regurgitation. No evidence of mitral valve stenosis. Tricuspid Valve: The tricuspid valve is normal in structure. Tricuspid valve regurgitation is mild . No evidence of tricuspid stenosis. Aortic Valve: The aortic valve is normal in structure. Aortic valve regurgitation is not visualized. Aortic valve sclerosis is present, with no evidence of aortic valve stenosis. Aortic valve mean gradient measures 2.5 mmHg. Aortic valve peak gradient measures 4.8 mmHg. Aortic valve area, by VTI measures 3.79 cm. Pulmonic Valve: The pulmonic valve was normal in  structure. Pulmonic valve regurgitation is not visualized. No evidence of pulmonic stenosis. Aorta: The aortic root is normal in size and structure. Venous: The inferior vena cava was not well visualized. IAS/Shunts: No atrial level shunt detected by color flow Doppler.  LEFT VENTRICLE PLAX 2D LVIDd:         4.10 cm   Diastology LVIDs:  3.00 cm   LV e' medial:    4.79 cm/s LV PW:         1.00 cm   LV E/e' medial:  11.4 LV IVS:        1.00 cm   LV e' lateral:   6.64 cm/s LVOT diam:     2.00 cm   LV E/e' lateral: 8.2 LV SV:         66 LV SV Index:   42 LVOT Area:     3.14 cm  RIGHT VENTRICLE RV Basal diam:  3.40 cm RV Mid diam:    3.10 cm RV S prime:     17.80 cm/s TAPSE (M-mode): 2.4 cm LEFT ATRIUM             Index        RIGHT ATRIUM           Index LA diam:        3.80 cm 2.39 cm/m   RA Area:     11.20 cm LA Vol (A2C):   51.5 ml 32.32 ml/m  RA Volume:   20.60 ml  12.93 ml/m LA Vol (A4C):   41.8 ml 26.24 ml/m LA Biplane Vol: 50.5 ml 31.70 ml/m  AORTIC VALVE AV Area (Vmax):    3.03 cm AV Area (Vmean):   3.10 cm AV Area (VTI):     3.79 cm AV Vmax:           110.00 cm/s AV Vmean:          73.300 cm/s AV VTI:            0.175 m AV Peak Grad:      4.8 mmHg AV Mean Grad:      2.5 mmHg LVOT Vmax:         106.00 cm/s LVOT Vmean:        72.400 cm/s LVOT VTI:          0.211 m LVOT/AV VTI ratio: 1.21  AORTA Ao Root diam: 2.70 cm MITRAL VALVE               TRICUSPID VALVE MV Area (PHT): 6.37 cm    TR Peak grad:   47.9 mmHg MV Decel Time: 119 msec    TR Vmax:        346.00 cm/s MV E velocity: 54.50 cm/s MV A velocity: 94.90 cm/s  SHUNTS MV E/A ratio:  0.57        Systemic VTI:  0.21 m                            Systemic Diam: 2.00 cm Kathlyn Sacramento MD Electronically signed by Kathlyn Sacramento MD Signature Date/Time: 04/19/2022/1:40:45 PM    Final    CT CHEST ABDOMEN PELVIS W CONTRAST  Result Date: 04/18/2022 CLINICAL DATA:  Paraesophageal hernia repair complicated by perforation EXAM: CT CHEST, ABDOMEN, AND  PELVIS WITH CONTRAST TECHNIQUE: Multidetector CT imaging of the chest, abdomen and pelvis was performed following the standard protocol during bolus administration of intravenous contrast. RADIATION DOSE REDUCTION: This exam was performed according to the departmental dose-optimization program which includes automated exposure control, adjustment of the mA and/or kV according to patient size and/or use of iterative reconstruction technique. CONTRAST:  148m OMNIPAQUE IOHEXOL 350 MG/ML SOLN COMPARISON:  04/15/2022 FINDINGS: CT CHEST FINDINGS Cardiovascular: The heart is unremarkable without pericardial effusion. No evidence of thoracic aortic aneurysm or dissection. Atheromatous plaque within the  descending thoracic aorta. Diffuse atherosclerosis of the aorta and coronary vasculature. Mediastinum/Nodes: Minimal gas within the anterior mediastinum consistent with recent surgical intervention. Enteric catheter extends into the gastric lumen. Thyroid and trachea are unremarkable. Previous Nissen fundoplication, with small hiatal hernia again identified. Loculated fluid along the right lateral aspect of the hiatal hernia sac, measuring 4.0 x 2.3 cm, decreased since prior study. There is intraluminal gas within the hiatal hernia at site of prior Nissen fundoplication. No extraluminal gas is seen within the lower mediastinum. Lungs/Pleura: Moderate bilateral pleural effusions, right greater than left. Dependent lower lobe atelectasis. Background emphysema. Right chest tube along the right mediastinal margin. No pneumothorax. Musculoskeletal: No acute or destructive bony lesions. Subcutaneous gas within the chest wall consistent with recent surgical intervention. Reconstructed images demonstrate no additional findings. CT ABDOMEN PELVIS FINDINGS Hepatobiliary: No focal liver abnormality is seen. No gallstones, gallbladder wall thickening, or biliary dilatation. Pancreas: Unremarkable. No pancreatic ductal dilatation or  surrounding inflammatory changes. Spleen: Normal in size without focal abnormality. Adrenals/Urinary Tract: Adrenal glands are unremarkable. Kidneys are normal, without renal calculi, focal lesion, or hydronephrosis. Bladder is decompressed with a Foley catheter. Stomach/Bowel: No bowel obstruction or ileus. Diffuse colonic diverticulosis without evidence of diverticulitis. Normal appendix right lower quadrant. Previous Nissen fundoplication, with small hiatal hernia again noted. Enteric catheter extends into the gastric lumen. There is a percutaneous jejunostomy tube, tip within the right mid abdomen within the mid jejunum. Vascular/Lymphatic: Aortic atherosclerosis. No enlarged abdominal or pelvic lymph nodes. Reproductive: Uterus and bilateral adnexa are unremarkable. Other: Surgical drain enters the abdomen in the left paraumbilical region, and extends superiorly through the sub hepatic region with tip residing at the diaphragmatic hiatus. There is no free fluid or free intraperitoneal gas. No abdominal wall hernia. Musculoskeletal: No acute or destructive bony lesions. Postsurgical changes right hip. Reconstructed images demonstrate no additional findings. IMPRESSION: 1. Postsurgical changes related to interval esophageal perforation repair. Surgical drain tip at the gastroesophageal junction at the diaphragmatic hiatus, with 4.0 x 2.3 cm residual fluid collection along the right lateral margin of the distal esophagus. No extraluminal gas to suggest persistent or recurrent perforation. 2. Moderate bilateral pleural effusions and dependent lower lobe atelectasis, right greater than left. Right chest tube in place, with no residual or recurrent pneumothorax. 3. Enteric catheter and percutaneous jejunostomy tube as above. 4. Colonic diverticulosis without diverticulitis. 5.  Aortic Atherosclerosis (ICD10-I70.0). Electronically Signed   By: Randa Ngo M.D.   On: 04/18/2022 17:45    Cardiac Studies   See TTE  above.  Patient Profile     82 y.o. female with history of hypertension, COPD, paraesophageal hernia status post repair, and vitamin B12 deficiency, who we have been asked to see due to postoperative SVT.  Assessment & Plan    SVT: No further SVT noted.  A few PACs have been seen on telemetry.  I agree with Dr. Tyrell Antonio recommendations to continue with amiodarone infusion to help maintain sinus rhythm in the setting of the patient's acute illness.  Once she is able to tolerate oral medications, I agree with transitioning to oral metoprolol.  IV metoprolol can also be continued for blood pressure control.    For questions or updates, please contact [place] Please consult [email or web address] for contact info under [facility] Cardiology.      Signed, Nelva Bush, MD  04/20/2022, 3:07 PM

## 2022-04-20 NOTE — Progress Notes (Signed)
PROGRESS NOTE   Kelly Wood  FTD:[id number]    DOB: 06-26-1940    DOA: 04/15/2022  PCP: Idelle Crouch, MD   I have briefly reviewed patients previous medical records in [facility].  No chief complaint on file.   Brief Narrative:  82 year old married female, PMH of HTN, HLD, hypothyroid, COPD, anemia, GERD, s/p repair of paraesophageal hernia robotically approximately 11 months ago, presented to ED on 04/15/2022 with persistent bloating, eructation and chest discomfort.  She was diagnosed with symptomatic recurrent paraesophageal hernia.  Admitted by the surgical team.  She underwent robotic assisted laparoscopic repair of paraesophageal hernia with bio mesh and Nissen fundoplication, redo on 0/62 which was complicated by esophageal perforation, right pneumothorax requiring right chest tube placement, robotic assisted repair of esophageal perforation, jejunostomy tube, mediastinal Blake drain placement.  TRH consulted 1/12 for management of medical issues.  1/14: New onset narrow complex tachycardia, SVT, transferred to stepdown.  Cardiology consulted.  Started on IV amiodarone and maintaining SR.   Assessment & Plan:  Principal Problem:   S/P repair of paraesophageal hernia Active Problems:   Esophageal perforation  SVT Developed 1/14 morning.  Rates up to 160s.  Did not see prior history of A-fib, a flutter or SVT.  Per patient and spouse, told to have "tachycardia" without specification.  Not on anticoagulation.  Home dose of Cardizem CD 240 mg daily and Ziac (bisoprolol 5 Mg) on hold since admission due to n.p.o.  Blood pressure being managed with metoprolol 5 Mg IV every 6 hours.  Transferred to stepdown. TSH: 6.278, free T4: 0.99, suspect sick euthyroid and recommend repeating TFTs in 4 weeks and continue current dose of Synthroid (initiated IV Synthroid 1/16). Cardiology consulted and their input appreciated.  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  She was started on IV amiodarone drip and maintaining sinus rhythm.  As per cardiology, when able to take oral medications, can consider stopping IV amiodarone and switching to oral metoprolol.  No indication for anticoagulation.  TTE 1/15: LVEF 37-62% grade 1 diastolic dysfunction.  Delirium: Over the weekend, in the evenings, patient appeared to get confused and anxious.  Had paradoxical response reportedly to IV Ativan yesterday.  Avoid benzodiazepines.  Delirium precautions.  Minimize opioids.  As per spouse at bedside, no recurrence of AMS overnight.  Essential hypertension: PTA on bisoprolol-HCTZ daily, Cardizem CD 240 mg daily.  Continue IV metoprolol 5 Mg every 6 hours.  As needed IV hydralazine.  Reasonable inpatient control.  Hypothyroidism: Last dose of Synthroid 75 mg daily was on 1/11 and can hold it for up to 7 days prior to considering IV if needed.  Started IV Synthroid 1/16.  This can be switched to oral when tolerating by mouth.  Hyperlipidemia: On Pravachol 80 mg daily PTA.  Mildly elevated AST and ALT, likely due to tube feeds, hold off on starting statins at this time.  Resume when able to tolerate p.o. LFT abnormalities have resolved.  Hypokalemia: Continue to replace as needed and follow.  Magnesium 1.9.  Anemia: Likely multifactorial.  Stable.  S/p robotic assisted laparoscopic paraesophageal hernia repair 8/31, complicated by esophageal leak requiring immediate takeback for robotic assisted primary repair of esophageal perforation, placement of mediastinal drain/right chest tube/feeding jejunostomy tube: Management per primary service.  Will remain n.p.o. x 5 days, enteral feeding via jejunostomy tube, no pills via jejunostomy tube, liquids only (confirmed with surgical team).  IV Zosyn.  Multimodality pain control.  Right-sided chest tube changed by IR on 1/15 but not much drainage and surgeons are planning to reevaluate with CT chest and  reposition chest tube as needed due to persisting large pleural effusion.  Mild transaminitis: ?  Related to tube feeds.  Follow CMP daily.  Statins on hold.  Resolved.  Leukocytosis: WBC up to 17 K.  Remains on IV Zosyn.  Improved.   Body mass index is 26.91 kg/m.  Nutritional Status Nutrition Problem: Increased nutrient needs Etiology: post-op healing Signs/Symptoms: estimated needs Interventions: Tube feeding, Prostat  Pressure Ulcer:     DVT prophylaxis: enoxaparin (LOVENOX) injection 40 mg Start: 04/16/22 0800 SCDs Start: 04/15/22 1132     Code Status: Full Code:  Family Communication: Spouse at bedside. Disposition:  Inpatient appropriate due to ongoing IV antibiotics, IV amiodarone, n.p.o., G-tube feeds etc.     Consultants:   [facility]. Cardiology  Procedures:   As above  Antimicrobials:   IV Zosyn   Subjective:  Patient did better overnight.  No confusion or agitation.  Slept well.  No specific complaints reported.  No chest pain or dyspnea.  Patient had a soft BM this morning.  Objective:   Vitals:   04/20/22 0900 04/20/22 1000 04/20/22 1100 04/20/22 1222  BP: (!) 149/89 137/87 (!) 143/79 (!) 159/93  Pulse: 86 76 78 81  Resp: '19 18 19 18  '$ Temp:    97.7 F (36.5 C)  TempSrc:    Oral  SpO2: 95% 96% 95% 99%  Weight:      Height:        General exam: Elderly female, small built and frail lying comfort bili propped up in bed.  Continuing to look better compared to the weekend. Respiratory system: Reduced breath right lung bases.  Rest of lung fields clear to auscultation.  Right-sided chest tube.  NG tube present.  On [place] oxygen 2 L/min and saturating in the mid 90s. Cardiovascular system: S1 & S2 heard, regular tachycardia. No JVD, murmurs, rubs, gallops or clicks. No pedal edema.  Telemetry personally reviewed: Remains in sinus rhythm without any further runs of SVT. Gastrointestinal system: Abdomen is nondistended, soft and nontender. No  organomegaly or masses felt. Normal bowel sounds heard.  Gastrostomy tube site without acute findings. Central nervous system: Alert and oriented. No focal neurological deficits.  May be somewhat hard of hearing. Extremities: Symmetric 5 x 5 power. Skin: No rashes, lesions or ulcers Psychiatry: Judgement and insight appear somewhat impaired. Mood & affect appropriate.     Data Reviewed:   I have personally reviewed following labs and imaging studies   CBC: Recent Labs  Lab 04/17/22 0632 04/19/22 0413 04/20/22 0323  WBC 15.3* 17.4* 11.3*  HGB 9.8* 10.7* 10.1*  HCT 31.2* 33.9* 30.8*  MCV 86.7 86.0 83.0  PLT 254 323 157    Basic Metabolic Panel: Recent Labs  Lab 04/15/22 1644 04/17/22 0632 04/18/22 0616 04/19/22 0413 04/20/22 0323  NA  --  134* 135 136 133*  K  --  3.3* 3.6 4.0 3.4*  CL  --  103 106 103 100  CO2  --  '24 24 25 22  '$ GLUCOSE  --  131* 149* 118* 134*  BUN  --  '21 15 15 20  '$ CREATININE 0.66 0.73 0.50 0.63 0.58  CALCIUM  --  7.5* 7.4* 7.7* 7.6*  MG  --  1.8 1.9  --  1.9  PHOS  --  2.9  --   --  4.3  Liver Function Tests: Recent Labs  Lab 04/17/22 0632 04/18/22 0616 04/19/22 0413  AST 110* 61* 31  ALT 87* 78* 54*  ALKPHOS 63 111 122  BILITOT 0.2* 0.5 0.5  PROT 5.3* 5.5* 5.8*  ALBUMIN 2.3* 2.2* 2.2*    CBG: Recent Labs  Lab 04/20/22 0330 04/20/22 0805 04/20/22 1135  GLUCAP 136* 125* 116*    Microbiology Studies:   Recent Results (from the past 240 hour(s))  MRSA Next Gen by PCR, Nasal     Status: None   Collection Time: 04/18/22  9:03 AM   Specimen: Nasal Mucosa; Nasal Swab  Result Value Ref Range Status   MRSA by PCR Next Gen NOT DETECTED NOT DETECTED Final    Comment: (NOTE) The GeneXpert MRSA Assay (FDA approved for NASAL specimens only), is one component of a comprehensive MRSA colonization surveillance program. It is not intended to diagnose MRSA infection nor to guide or monitor treatment for MRSA infections. Test  performance is not FDA approved in patients less than 63 years old. Performed at [facility], [address], [place], [place] [address]     Radiology Studies:  IR [facility] PLEURAL DRAIN W/INDWELL CATH W/IMG GUIDE  Result Date: 04/20/2022 INDICATION: 82 year old woman with right hydropneumothorax related to esophageal leak. Patient has a right chest tube in place which terminates anteriorly. It has resolved the pneumothorax, however fluid remains within the dependent portion of the chest. IR consulted for removal of right anterior chest tube and placement of right posterior chest tube for drainage of remaining fluid. EXAM: 1. Ultrasound-guided right chest tube placement (14 [place]) 2. Right anterior chest tube removal. MEDICATIONS: 25 mcg IV fentanyl ANESTHESIA/SEDATION: None COMPLICATIONS: None immediate. PROCEDURE: Informed written consent was obtained from the patient after a thorough discussion of the procedural risks, benefits and alternatives. All questions were addressed. Maximal Sterile Barrier Technique was utilized including caps, mask, sterile gowns, sterile gloves, sterile drape, hand hygiene and skin antiseptic. A timeout was performed prior to the initiation of the procedure. Patient positioned supine on the procedure table. Ultrasound evaluation of the right chest demonstrated a small pleural effusion. The overlying skin was prepped and draped in sterile fashion. Following local lidocaine administration, the pleural space was accessed with a 19 gauge Yueh needle. Straw-colored fluid was seen returning from the needle hub. The Yueh needle was removed over 0.035 inch guidewire. Serial dilation was performed and 14 French pigtail catheter was inserted over the guidewire. The new drain was connected to pleura vac set at -20 cm H2O. The drain was secured to skin with suture and covered with sterile dressing. The right anterior chest tube was then removed without difficulty. The insertion  site was covered with Vaseline gauze and sterile dressing. IMPRESSION: Successful ultrasound-guided insertion of right chest tube (14 [place]). Electronically Signed   By: Miachel Roux M.D.   On: 04/20/2022 08:55   DG Chest Port 1 View  Result Date: 04/19/2022 CLINICAL DATA:  Chest tube placement EXAM: PORTABLE CHEST 1 VIEW COMPARISON:  04/18/2022 FINDINGS: Single frontal view of the chest was performed. There has been replacement of the right-sided chest tube, with new pigtail drainage catheter coiled over the right lateral mid chest. Decreased right pleural effusion after chest tube repositioning. Minimal gas at the right costophrenic angle likely due to recent intervention. Stable areas of bilateral consolidation and left pleural effusion. Enteric catheter unchanged. Cardiac silhouette remains enlarged. IMPRESSION: 1. Interval replacement of the right pigtail drainage catheter, with decreased right pleural effusion after  catheter replacement. Trace amount of gas within the right pleural space likely due to recent intervention. 2. Stable bilateral consolidation and left pleural effusion. Electronically Signed   By: Randa Ngo M.D.   On: 04/19/2022 16:58   ECHOCARDIOGRAM COMPLETE  Result Date: 04/19/2022    ECHOCARDIOGRAM REPORT   Patient Name:   Kelly Wood [facility] Date of Exam: 04/19/2022 Medical Rec #:  [id number]       Height:       60.0 in Accession #:    [id number]      Weight:       137.8 lb Date of Birth:  02/20/1941       BSA:          1.593 m Patient Age:    33 years        BP:           136/77 mmHg Patient Gender: F               HR:           98 bpm. Exam Location:  [facility] Procedure: 2D Echo, Cardiac Doppler and Color Doppler Indications:     Atrial Flutter I48.92  History:         Patient has no prior history of Echocardiogram examinations.                  COPD; Risk Factors:Hypertension.  Sonographer:     Sherrie Sport Referring Phys:  [place] Diagnosing Phys: Kathlyn Sacramento MD   Sonographer Comments: Technically difficult study due to poor echo windows. IMPRESSIONS  1. Left ventricular ejection fraction, by estimation, is 55 to 60%. The left ventricle has normal function. Left ventricular endocardial border not optimally defined to evaluate regional wall motion. Left ventricular diastolic parameters are consistent with Grade I diastolic dysfunction (impaired relaxation).  2. Right ventricular systolic function is normal. The right ventricular size is normal. There is moderately elevated pulmonary artery systolic pressure. The estimated right ventricular systolic pressure is 00.3 mmHg.  3. Left atrial size was mildly dilated.  4. The mitral valve is normal in structure. Mild mitral valve regurgitation. No evidence of mitral stenosis. Moderate mitral annular calcification.  5. The aortic valve is normal in structure. Aortic valve regurgitation is not visualized. Aortic valve sclerosis is present, with no evidence of aortic valve stenosis. FINDINGS  Left Ventricle: Left ventricular ejection fraction, by estimation, is 55 to 60%. The left ventricle has normal function. Left ventricular endocardial border not optimally defined to evaluate regional wall motion. The left ventricular internal cavity size was normal in size. There is no left ventricular hypertrophy. Left ventricular diastolic parameters are consistent with Grade I diastolic dysfunction (impaired relaxation). Right Ventricle: The right ventricular size is normal. No increase in right ventricular wall thickness. Right ventricular systolic function is normal. There is moderately elevated pulmonary artery systolic pressure. The tricuspid regurgitant velocity is 3.46 m/s, and with an assumed right atrial pressure of 5 mmHg, the estimated right ventricular systolic pressure is 70.4 mmHg. Left Atrium: Left atrial size was mildly dilated. Right Atrium: Right atrial size was normal in size. Pericardium: There is no evidence of pericardial  effusion. Mitral Valve: The mitral valve is normal in structure. Moderate mitral annular calcification. Mild mitral valve regurgitation. No evidence of mitral valve stenosis. Tricuspid Valve: The tricuspid valve is normal in structure. Tricuspid valve regurgitation is mild . No evidence of tricuspid stenosis. Aortic Valve: The aortic valve is normal in structure.  Aortic valve regurgitation is not visualized. Aortic valve sclerosis is present, with no evidence of aortic valve stenosis. Aortic valve mean gradient measures 2.5 mmHg. Aortic valve peak gradient measures 4.8 mmHg. Aortic valve area, by VTI measures 3.79 cm. Pulmonic Valve: The pulmonic valve was normal in structure. Pulmonic valve regurgitation is not visualized. No evidence of pulmonic stenosis. Aorta: The aortic root is normal in size and structure. Venous: The inferior vena cava was not well visualized. IAS/Shunts: No atrial level shunt detected by color flow Doppler.  LEFT VENTRICLE PLAX 2D LVIDd:         4.10 cm   Diastology LVIDs:         3.00 cm   LV e' medial:    4.79 cm/s LV PW:         1.00 cm   LV E/e' medial:  11.4 LV IVS:        1.00 cm   LV e' lateral:   6.64 cm/s LVOT diam:     2.00 cm   LV E/e' lateral: 8.2 LV SV:         66 LV SV Index:   42 LVOT Area:     3.14 cm  RIGHT VENTRICLE RV Basal diam:  3.40 cm RV Mid diam:    3.10 cm RV S prime:     17.80 cm/s TAPSE (M-mode): 2.4 cm LEFT ATRIUM             Index        RIGHT ATRIUM           Index LA diam:        3.80 cm 2.39 cm/m   RA Area:     11.20 cm LA Vol (A2C):   51.5 ml 32.32 ml/m  RA Volume:   20.60 ml  12.93 ml/m LA Vol (A4C):   41.8 ml 26.24 ml/m LA Biplane Vol: 50.5 ml 31.70 ml/m  AORTIC VALVE AV Area (Vmax):    3.03 cm AV Area (Vmean):   3.10 cm AV Area (VTI):     3.79 cm AV Vmax:           110.00 cm/s AV Vmean:          73.300 cm/s AV VTI:            0.175 m AV Peak Grad:      4.8 mmHg AV Mean Grad:      2.5 mmHg LVOT Vmax:         106.00 cm/s LVOT Vmean:        72.400  cm/s LVOT VTI:          0.211 m LVOT/AV VTI ratio: 1.21  AORTA Ao Root diam: 2.70 cm MITRAL VALVE               TRICUSPID VALVE MV Area (PHT): 6.37 cm    TR Peak grad:   47.9 mmHg MV Decel Time: 119 msec    TR Vmax:        346.00 cm/s MV E velocity: 54.50 cm/s MV A velocity: 94.90 cm/s  SHUNTS MV E/A ratio:  0.57        Systemic VTI:  0.21 m                            Systemic Diam: 2.00 cm Kathlyn Sacramento MD Electronically signed by Kathlyn Sacramento MD Signature Date/Time: 04/19/2022/1:40:45 PM    Final    CT  CHEST ABDOMEN PELVIS W CONTRAST  Result Date: 04/18/2022 CLINICAL DATA:  Paraesophageal hernia repair complicated by perforation EXAM: CT CHEST, ABDOMEN, AND PELVIS WITH CONTRAST TECHNIQUE: Multidetector CT imaging of the chest, abdomen and pelvis was performed following the standard protocol during bolus administration of intravenous contrast. RADIATION DOSE REDUCTION: This exam was performed according to the departmental dose-optimization program which includes automated exposure control, adjustment of the mA and/or kV according to patient size and/or use of iterative reconstruction technique. CONTRAST:  154m OMNIPAQUE IOHEXOL 350 MG/ML SOLN COMPARISON:  04/15/2022 FINDINGS: CT CHEST FINDINGS Cardiovascular: The heart is unremarkable without pericardial effusion. No evidence of thoracic aortic aneurysm or dissection. Atheromatous plaque within the descending thoracic aorta. Diffuse atherosclerosis of the aorta and coronary vasculature. Mediastinum/Nodes: Minimal gas within the anterior mediastinum consistent with recent surgical intervention. Enteric catheter extends into the gastric lumen. Thyroid and trachea are unremarkable. Previous Nissen fundoplication, with small hiatal hernia again identified. Loculated fluid along the right lateral aspect of the hiatal hernia sac, measuring 4.0 x 2.3 cm, decreased since prior study. There is intraluminal gas within the hiatal hernia at site of prior Nissen  fundoplication. No extraluminal gas is seen within the lower mediastinum. Lungs/Pleura: Moderate bilateral pleural effusions, right greater than left. Dependent lower lobe atelectasis. Background emphysema. Right chest tube along the right mediastinal margin. No pneumothorax. Musculoskeletal: No acute or destructive bony lesions. Subcutaneous gas within the chest wall consistent with recent surgical intervention. Reconstructed images demonstrate no additional findings. CT ABDOMEN PELVIS FINDINGS Hepatobiliary: No focal liver abnormality is seen. No gallstones, gallbladder wall thickening, or biliary dilatation. Pancreas: Unremarkable. No pancreatic ductal dilatation or surrounding inflammatory changes. Spleen: Normal in size without focal abnormality. Adrenals/Urinary Tract: Adrenal glands are unremarkable. Kidneys are normal, without renal calculi, focal lesion, or hydronephrosis. Bladder is decompressed with a Foley catheter. Stomach/Bowel: No bowel obstruction or ileus. Diffuse colonic diverticulosis without evidence of diverticulitis. Normal appendix right lower quadrant. Previous Nissen fundoplication, with small hiatal hernia again noted. Enteric catheter extends into the gastric lumen. There is a percutaneous jejunostomy tube, tip within the right mid abdomen within the mid jejunum. Vascular/Lymphatic: Aortic atherosclerosis. No enlarged abdominal or pelvic lymph nodes. Reproductive: Uterus and bilateral adnexa are unremarkable. Other: Surgical drain enters the abdomen in the left paraumbilical region, and extends superiorly through the sub hepatic region with tip residing at the diaphragmatic hiatus. There is no free fluid or free intraperitoneal gas. No abdominal wall hernia. Musculoskeletal: No acute or destructive bony lesions. Postsurgical changes right hip. Reconstructed images demonstrate no additional findings. IMPRESSION: 1. Postsurgical changes related to interval esophageal perforation repair.  Surgical drain tip at the gastroesophageal junction at the diaphragmatic hiatus, with 4.0 x 2.3 cm residual fluid collection along the right lateral margin of the distal esophagus. No extraluminal gas to suggest persistent or recurrent perforation. 2. Moderate bilateral pleural effusions and dependent lower lobe atelectasis, right greater than left. Right chest tube in place, with no residual or recurrent pneumothorax. 3. Enteric catheter and percutaneous jejunostomy tube as above. 4. Colonic diverticulosis without diverticulitis. 5.  Aortic Atherosclerosis (ICD10-I70.0). Electronically Signed   By: MRanda NgoM.D.   On: 04/18/2022 17:45    Scheduled Meds:    Chlorhexidine Gluconate Cloth  6 each Topical Daily   Chlorhexidine Gluconate Cloth  6 each Topical Q0600   enoxaparin (LOVENOX) injection  40 mg Subcutaneous Q24H   free water  30 mL Per Tube Q4H   ketorolac  15 mg Intravenous Q6H   metoprolol  tartrate  5 mg Intravenous Q6H   pantoprazole (PROTONIX) IV  40 mg Intravenous Q12H    Continuous Infusions:    acetaminophen Stopped (04/20/22 1056)   amiodarone 30 mg/hr (04/20/22 1104)   feeding supplement (OSMOLITE 1.2 CAL) 60 mL/hr at 04/20/22 1104   piperacillin-tazobactam (ZOSYN)  IV 3.375 g (04/20/22 0636)     LOS: 4 days   Critical care time spent: 40 minutes  Vernell Leep, MD,  FACP, [facility], [facility], [facility], [place]     To contact the attending provider between 7A-7P or the covering provider during after hours 7P-7A, please log into the web site [email or web address] and access using universal [place] password for that web site. If you do not have the password, please call the hospital operator.  04/20/2022, 1:06 PM

## 2022-04-20 NOTE — Progress Notes (Addendum)
[facility] Day(s): 4.   Post op day(s): 5 Days Post-Op.   Interval History:  Patient seen and examined No issues overnight HR controlled on IV amiodarone  Patient reports she is doing well; anxious to get NGT out No significant abdominal pain; no CP/SOB No fever, chills, nausea, emesis Minimal incisional soreness Leukocytosis improving; down to 11.3K Hgb to 10.1 Renal function normal; sCr - 0.58; UO - 1150 Mild hypokalemia to 3.4 NGT in place; output minimal; 75 ccs Drain present; mediastinal; 5 ccs; output serous  Chest tube present; right; ~50 ccs in pleurovac; serous; no air leak NPO Continues on Zosyn   Vital signs in last 24 hours: [min-max] current  Temp:  [97.3 F (36.3 C)-98.6 F (37 C)] 97.9 F (36.6 C) (01/16 0400) Pulse Rate:  [73-92] 88 (01/16 0400) Resp:  [14-27] 14 (01/16 0400) BP: (131-172)/(74-99) 149/91 (01/16 0400) SpO2:  [96 %-98 %] 97 % (01/16 0400) Weight:  [62.5 kg] 62.5 kg (01/16 0334)     Height: 5' (152.4 cm) Weight: 62.5 kg BMI (Calculated): 26.91   Intake/Output last 2 shifts:  01/15 0701 - 01/16 0700 In: 2118.5 [I.V.:366.5; NG/GT:1752] Out: 1660 [Urine:1150; Emesis/NG output:75; Drains:5; Chest Tube:25]   Physical Exam:  Constitutional: alert, cooperative and no distress  HEENT: NGT in place Chest: Right sided chest tube; site CDI; minimal serous output; no air leak Respiratory: breathing non-labored at rest; on Bells Cardiovascular: regular rate and sinus rhythm  Gastrointestinal: soft, non-tender, and non-distended, no rebound/guarding. Mediastinal drain; minimal serous output Genitourinary: Foley in place; good UO Integumentary: Laparoscopic incisions are CDI; no erythema  Labs:     Latest Ref Rng & Units 04/20/2022    3:23 AM 04/19/2022    4:13 AM 04/17/2022    6:32 AM  CBC  WBC 4.0 - 10.5 K/uL 11.3  17.4  15.3   Hemoglobin 12.0 - 15.0 g/dL 10.1  10.7  9.8   Hematocrit 36.0 - 46.0  % 30.8  33.9  31.2   Platelets 150 - 400 K/uL 315  323  254       Latest Ref Rng & Units 04/20/2022    3:23 AM 04/19/2022    4:13 AM 04/18/2022    6:16 AM  CMP  Glucose 70 - 99 mg/dL 134  118  149   BUN 8 - 23 mg/dL '20  15  15   '$ Creatinine 0.44 - 1.00 mg/dL 0.58  0.63  0.50   Sodium 135 - 145 mmol/L 133  136  135   Potassium 3.5 - 5.1 mmol/L 3.4  4.0  3.6   Chloride 98 - 111 mmol/L 100  103  106   CO2 22 - 32 mmol/L '22  25  24   '$ Calcium 8.9 - 10.3 mg/dL 7.6  7.7  7.4   Total Protein 6.5 - 8.1 g/dL  5.8  5.5   Total Bilirubin 0.3 - 1.2 mg/dL  0.5  0.5   Alkaline Phos 38 - 126 U/L  122  111   AST 15 - 41 U/L  31  61   ALT 0 - 44 U/L  54  78     Imaging studies: No new pertinent imaging studies   Assessment/Plan:  82 y.o. female 5 Days Post-Op s/p robotic assisted laparoscopic paraesophageal hernia repair complicated by esophageal leak requiring immediate take back for robotic assisted primary repair of esophageal perforation, placement of mediastinal drain, feeding jejunostomy placement, and right chest tube placements.    -  UGI tomorrow (01/17) - Continue strict NPO status; okay for mouth swabs, hard candies, gum  - Okay to advance enteric feeding via jejunostomy today; liquids only; NO PILLS via jejunostomy  - Continue chest tube; monitor and record output - Continue NGT decompression; LIS; monitor and record output - Continue foley for now; patient preference  - Continue Blake drain in mediastinum; monitor and record output  - Continue IV Abx (Zosyn)   - Monitor abdominal examination - Pain control prn; antiemetics prn - Can mobilize with therapies; HH --> okay to resume therapies; reordered 01/16 -Appreciate medicine/PCCM for assistance  All of the above findings and recommendations were discussed with the patient, patient's family, and the medical team, and all of patient's and family's questions were answered to their expressed satisfaction.  -- Kelly Simon,  PA-C [facility] 04/20/2022, 7:00 AM M-F: 7am - 4pm

## 2022-04-20 NOTE — TOC Initial Note (Signed)
Transition of Care [facility]) - Initial/Assessment Note    Patient Details  Name: Kelly Wood MRN: [id number] Date of Birth: 07-31-1940  Transition of Care [facility]) CM/SW Contact:    Shelbie Hutching, RN Phone Number: 04/20/2022, 11:04 AM  Clinical Narrative:                 Patient admitted to the hospital after having repair of paraesophageal hernia.  Patient is being transferred out of the ICU today to progressive unit.  Patient has NG to LIWS, chest tube, J tube.  RNCM met with patient and her husband at the bedside, they are from home in [place].  Patient has a cane and walker at home.  She has had home health services in the past after a hip surgery with [facility].  She would like to use the same home health company as last.  Corene Cornea with [facility] accepts referral for home health RN, PT, and OT.  Patient prefers home health to SNF.  TOC will cont to follow.   Expected Discharge Plan: [place] Barriers to Discharge: Continued Medical Work up   Patient Goals and CMS Choice Patient states their goals for this hospitalization and ongoing recovery are:: patient wants to go home with home health services CMS Medicare.gov Compare Post Acute Care list provided to:: Patient Choice offered to / list presented to : Patient      Expected Discharge Plan and Services   Discharge Planning Services: CM Consult Post Acute Care Choice: [place] arrangements for the past 2 months: Single Family Home                           HH Arranged: RN, PT, OT [facility] Agency: [place] ([facility]) Date HH Agency Contacted: 04/20/22 Time HH Agency Contacted: [id number] Representative spoke with at [place]: Corene Cornea  Prior Living Arrangements/Services Living arrangements for the past 2 months: [place] with:: Spouse Patient language and need for interpreter reviewed:: Yes Do you feel safe going back to the place where you live?: Yes      Need for Family  Participation in Patient Care: Yes (Comment) Care giver support system in place?: Yes (comment) Current home services: DME (cane, walker, walkin shower) Criminal Activity/Legal Involvement Pertinent to Current Situation/Hospitalization: No - Comment as needed  Activities of Daily Living Home Assistive Devices/Equipment: None ADL Screening (condition at time of admission) Patient's cognitive ability adequate to safely complete daily activities?: Yes Is the patient deaf or have difficulty hearing?: No Does the patient have difficulty seeing, even when wearing glasses/contacts?: No Does the patient have difficulty concentrating, remembering, or making decisions?: No Patient able to express need for assistance with ADLs?: Yes Does the patient have difficulty dressing or bathing?: No Independently performs ADLs?: Yes (appropriate for developmental age) Does the patient have difficulty walking or climbing stairs?: No Weakness of Legs: None Weakness of Arms/Hands: None  Permission Sought/Granted Permission sought to share information with : Family Supports, Other (comment), Case Manager Permission granted to share information with : Yes, Verbal Permission Granted  Share Information with NAME: Jiles Harold  Permission granted to share info w AGENCY: [facility]  Permission granted to share info w Relationship: spouse  Permission granted to share info w Contact Information: [phone]  Emotional Assessment Appearance:: Appears stated age Attitude/Demeanor/Rapport: Engaged Affect (typically observed): Accepting Orientation: : Oriented to Self, Oriented to Place, Oriented to  Time, Oriented to Situation Alcohol /  Substance Use: Not Applicable Psych Involvement: No (comment)  Admission diagnosis:  S/P repair of paraesophageal hernia [U23.536, Z87.19] Patient Active Problem List   Diagnosis Date Noted   Esophageal perforation 04/15/2022   S/P repair of paraesophageal hernia 04/07/2021   Pain  from implanted hardware 11/06/2020   Lumbar spondylosis 03/10/2020   HTN, goal below 140/80 08/09/2019   Trochanteric bursitis, right hip 06/27/2019   Cataract cortical, senile 05/11/2019   GERD (gastroesophageal reflux disease) 05/11/2019   Hyperlipidemia 05/11/2019   Shingles 05/11/2019   Thyroid disease 05/11/2019   Closed right hip fracture ([place]) 12/26/2018   Anemia due to vitamin B12 deficiency 11/06/2013   PCP:  Idelle Crouch, MD Pharmacy:   [id number] [place] [address] [place]Phone: [phone]: [phone]    Social Determinants of Health (SDOH) Social History: SDOH Screenings   Food Insecurity: No Food Insecurity (04/15/2022)  Housing: Low Risk  (04/15/2022)  Transportation Needs: No Transportation Needs (04/15/2022)  Utilities: Not At Risk (04/15/2022)  Tobacco Use: Medium Risk (04/20/2022)   SDOH Interventions: Housing Interventions: Patient Refused   Readmission Risk Interventions     No data to display

## 2022-04-20 NOTE — Evaluation (Signed)
Occupational Therapy Evaluation Patient Details Name: Kelly Wood MRN: [id number] DOB: [birth year] Today's Date: 04/20/2022   History of Present Illness Pt is a 81 year old female  22 Days Post-Op s/p robotic assisted laparoscopic paraesophageal hernia repair complicated by esophageal leak requiring immediate take back for robotic assisted primary repair of esophageal perforation, placement of mediastinal drain, feeding jejunostomy placement, and right chest tube placements; On 1/14 1/14: New onset narrow complex tachycardia, SVT, transferred to stepdown.  Cardiology consulted.  Started on IV amiodarone; PMH significant for  HTN, HLD, hypothyroid, COPD, anemia, GERD, s/p repair of paraesophageal hernia robotically approximately 11 months ago   Clinical Impression   Chart reviewed, nurse cleared pt for participation in OT re-evaluation. Co tx completed with PT on this date. Pt continues to present with deficits in strength, endurance, activity tolerance, balance all affecting safe and optimal ADL completion. MIN A +2 required for all bed mobility, STS with HHA, step pivot transfer to bedside chair. Frequent vcs required throughout for technique/body mechanics. MAX A required for toileting at bsc. Discussed discharge recommendations and options with patient and family, pt endorses she wants to return home, however as pt has has decreased functional mobility, intensive rehab may be indicated to address functional deficits and facilitate return to PLOF. Pt was indep in ADL/IADL PTA. OT will continue to follow acutely.   Of note, HR below 100 throughout mobility.      Recommendations for follow up therapy are one component of a multi-disciplinary discharge planning process, led by the attending physician.  Recommendations may be updated based on patient status, additional functional criteria and insurance authorization.   Follow Up Recommendations  Acute inpatient rehab (3hours/day)     Assistance  Recommended at Discharge Intermittent Supervision/Assistance  Patient can return home with the following Assistance with cooking/housework;Assist for transportation;Help with stairs or ramp for entrance;A lot of help with bathing/dressing/bathroom;A little help with walking and/or transfers    Functional Status Assessment  Patient has had a recent decline in their functional status and demonstrates the ability to make significant improvements in function in a reasonable and predictable amount of time.  Equipment Recommendations  BSC/3in1    Recommendations for Other Services       Precautions / Restrictions Precautions Precautions: Fall Precaution Comments: R chest tube, JP drain, jg tube, NG tube, foley Restrictions Weight Bearing Restrictions: No      Mobility Bed Mobility Overal bed mobility: Needs Assistance Bed Mobility: Supine to Sit, Sit to Supine     Supine to sit: Min assist, +2 for physical assistance, +2 for safety/equipment Sit to supine: Min assist, +2 for physical assistance, +2 for safety/equipment   General bed mobility comments: frequent vcs for body mechanics    Transfers Overall transfer level: Needs assistance Equipment used: 2 person hand held assist Transfers: Bed to chair/wheelchair/BSC, Sit to/from Stand Sit to Stand: Min assist, +2 physical assistance     Step pivot transfers: Min assist, +2 physical assistance            Balance Overall balance assessment: Needs assistance Sitting-balance support: Feet supported Sitting balance-Leahy Scale: Fair     Standing balance support: Bilateral upper extremity supported Standing balance-Leahy Scale: Poor                             ADL either performed or assessed with clinical judgement   ADL Overall ADL's : Needs assistance/impaired Eating/Feeding: NPO Eating/Feeding Details (indicate cue  type and reason): can chew gum/hard candies                 Lower Body Dressing:  Maximal assistance;Sit to/from stand   Toilet Transfer: Minimal assistance;+2 for safety/equipment;+2 for physical assistance;Cueing for sequencing;BSC/3in1 Toilet Transfer Details (indicate cue type and reason): step pivot to and from bsc; frequent vcs for technique with lines/leads/tubes Toileting- Clothing Manipulation and Hygiene: Maximal assistance;Sit to/from stand               Vision Baseline Vision/History: 1 Wears glasses Patient Visual Report: No change from baseline       Perception     Praxis      Pertinent Vitals/Pain Pain Assessment Pain Assessment: CPOT Facial Expression: Relaxed, neutral Body Movements: Absence of movements Muscle Tension: Relaxed Compliance with ventilator (intubated pts.): N/A Vocalization (extubated pts.): Sighing, moaning CPOT Total: 1 Pain Location: abdomen Pain Descriptors / Indicators: Discomfort, Guarding Pain Intervention(s): Monitored during session, Limited activity within patient's tolerance, Repositioned     Hand Dominance Right   Extremity/Trunk Assessment Upper Extremity Assessment Upper Extremity Assessment: Generalized weakness   Lower Extremity Assessment Lower Extremity Assessment: Generalized weakness;Defer to PT evaluation       Communication Communication Communication: No difficulties   Cognition Arousal/Alertness: Awake/alert Behavior During Therapy: WFL for tasks assessed/performed Overall Cognitive Status: Within Functional Limits for tasks assessed Area of Impairment: Problem solving                             Problem Solving: Slow processing, Requires tactile cues, Requires verbal cues       General Comments  all lines/leads/tubes intact following session; vitals monitored and appear stable throughout; Pt reports intemittent dizziness    Exercises Other Exercises Other Exercises: edu pt and spouse re: additional rehab if indiciated (dc recommendations for potential acute rehab),  continued importance of functional mobility   Shoulder Instructions      Home Living Family/patient expects to be discharged to:: Private residence Living Arrangements: Spouse/significant other Available Help at Discharge: Family;Available PRN/intermittently Type of Home: House Home Access: Stairs to enter [employer] of Steps: 2 Entrance Stairs-Rails: Right Home Layout: Two level;Able to live on main level with bedroom/bathroom     Bathroom Shower/Tub: Teacher, early years/pre: Standard Bathroom Accessibility: Yes   Home Equipment: Conservation officer, nature (2 wheels);Rollator (4 wheels);Cane - single point;BSC/3in1;Shower seat;Tub bench          Prior Functioning/Environment Prior Level of Function : Independent/Modified Independent;Driving             Mobility Comments: amb with no AD ADLs Comments: indep in ADL/IADL        OT Problem List: Decreased strength;Decreased activity tolerance;Impaired balance (sitting and/or standing);Pain;Decreased knowledge of precautions      OT Treatment/Interventions: Self-care/ADL training;Therapeutic activities;Therapeutic exercise;DME and/or AE instruction;Patient/family education;Balance training    OT Goals(Current goals can be found in the care plan section) Acute Rehab OT Goals Patient Stated Goal: return home OT Goal Formulation: With patient/family Time For Goal Achievement: 05/04/22 Potential to Achieve Goals: Good  OT Frequency: Min 3X/week    Co-evaluation PT/OT/SLP Co-Evaluation/Treatment: Yes Reason for Co-Treatment: Complexity of the patient's impairments (multi-system involvement);Necessary to address cognition/behavior during functional activity;For patient/therapist safety PT goals addressed during session: Mobility/safety with mobility OT goals addressed during session: ADL's and self-care;Proper use of Adaptive equipment and DME      AM-PAC OT "6 Clicks" Daily Activity  Outcome Measure  Help from another person eating meals?: Total Help from another person taking care of personal grooming?: A Lot Help from another person toileting, which includes using toliet, bedpan, or urinal?: A Lot Help from another person bathing (including washing, rinsing, drying)?: A Lot Help from another person to put on and taking off regular upper body clothing?: A Little Help from another person to put on and taking off regular lower body clothing?: A Lot 6 Click Score: 12   End of Session Equipment Utilized During Treatment: Oxygen Nurse Communication: Mobility status  Activity Tolerance: Patient tolerated treatment well Patient left: in bed;with call bell/phone within reach;with bed alarm set;with family/visitor present  OT Visit Diagnosis: Muscle weakness (generalized) (M62.81);Pain;Unsteadiness on feet (R26.81)                Time: 0459-9774 OT Time Calculation (min): 36 min Charges:  OT General Charges $OT Visit: 1 Visit OT Evaluation $OT Re-eval: 1 Re-eval OT Treatments $Self Care/Home Management : 8-22 mins  Shanon Payor, OTD OTR/L  04/20/22, 11:06 AM

## 2022-04-20 NOTE — Progress Notes (Signed)
Inpatient Rehab Admissions Coordinator:  ? ?Per therapy recommendations,  patient was screened for CIR candidacy by Codi Kertz, MS, CCC-SLP. At this time, Pt. Appears to be a a potential candidate for CIR. I will place   order for rehab consult per protocol for full assessment. Please contact me any with questions. ? ?Kacelyn Rowzee, MS, CCC-SLP ?Rehab Admissions Coordinator  ?[phone] (celll) ?[phone] (office) ? ?

## 2022-04-20 NOTE — Evaluation (Signed)
Physical Therapy Evaluation Patient Details Name: Kelly Wood MRN: [id number] DOB: [birth year] Today's Date: 04/20/2022  History of Present Illness  Patient is a 82 y.o. female s/p robotic assisted laparoscopic paraesophageal hernia repair complicated by esophageal leak requiring immediate take back for primary repair of esophageal perforation, placement of mediastinal drain, feeding jejunostomy placement, and right chest tube placements.   Clinical Impression  Patient is agreeable to PT session. She is usually independent with mobility without assistive device. The patient currently requires assistance with bed mobility, transfers, and to take side steps. Increased time and effort required with all functional mobility. The patient does report some mild dizziness with upright activity with vitals stable throughout. The patient is not at her baseline level of functional mobility and would benefit from ongoing PT to maximize independence and facilitate return to prior level of function. The patient and spouse prefer to go home at discharge. Consider acute inpatient rehab if patient does not progress to the level of independence required for home discharge. Spouse is very supportive.      Recommendations for follow up therapy are one component of a multi-disciplinary discharge planning process, led by the attending physician.  Recommendations may be updated based on patient status, additional functional criteria and insurance authorization.  Follow Up Recommendations Acute inpatient rehab (3hours/day)      Assistance Recommended at Discharge Intermittent Supervision/Assistance  Patient can return home with the following  A little help with walking and/or transfers;A little help with bathing/dressing/bathroom;Assist for transportation;Help with stairs or ramp for entrance;Assistance with cooking/housework    Equipment Recommendations None recommended by PT  Recommendations for Other Services        Functional Status Assessment Patient has had a recent decline in their functional status and demonstrates the ability to make significant improvements in function in a reasonable and predictable amount of time.     Precautions / Restrictions Precautions Precautions: Fall Precaution Comments: R chest tube, JP drain, G tube, NG tube Restrictions Weight Bearing Restrictions: No      Mobility  Bed Mobility Overal bed mobility: Needs Assistance Bed Mobility: Supine to Sit, Sit to Supine     Supine to sit: Min assist, +2 for physical assistance Sit to supine: Min assist, +2 for physical assistance   General bed mobility comments: verbal cues for logroll technique and sequencing. assistance for trunk and BLE support required    Transfers Overall transfer level: Needs assistance Equipment used: 2 person hand held assist Transfers: Bed to chair/wheelchair/BSC, Sit to/from Stand Sit to Stand: Min assist, +2 physical assistance   Step pivot transfers: Min assist, +2 physical assistance       General transfer comment: multiple transfers performed during session. cues for anterior weight shifting and safety. patient does report mild dizziness with mobility with vitals monitored and stable throughout    Ambulation/Gait Ambulation/Gait assistance: Min assist, +2 physical assistance Gait Distance (Feet): 3 Feet           General Gait Details: patient required steading assistance to take lateral side steps along edge of bed. activity tolerance limited by fatigue  Stairs            Wheelchair Mobility    Modified Rankin (Stroke Patients Only)       Balance Overall balance assessment: Needs assistance Sitting-balance support: Feet supported Sitting balance-Leahy Scale: Fair     Standing balance support: Bilateral upper extremity supported Standing balance-Leahy Scale: Poor Standing balance comment: external support required to maintain stanidng balance without  rolling walker                             Pertinent Vitals/Pain Pain Assessment Pain Assessment: Faces Faces Pain Scale: Hurts little more Pain Location: abdominal/incisional Pain Descriptors / Indicators: Discomfort, Guarding Pain Intervention(s): Limited activity within patient's tolerance, Monitored during session, Repositioned    Home Living Family/patient expects to be discharged to:: Private residence Living Arrangements: Spouse/significant other Available Help at Discharge: Family;Available PRN/intermittently (spouse unable to stay 24/7, needs to go out briefly at times) Type of Home: House Home Access: Stairs to enter Entrance Stairs-Rails: Right Entrance Stairs-Number of Steps: 2   Home Layout: Two level;Able to live on main level with bedroom/bathroom Home Equipment: Rolling Walker (2 wheels);Rollator (4 wheels);Cane - single point;BSC/3in1;Shower seat;Tub bench      Prior Function Prior Level of Function : Independent/Modified Independent;Driving                     Hand Dominance        Extremity/Trunk Assessment   Upper Extremity Assessment Upper Extremity Assessment: Defer to OT evaluation    Lower Extremity Assessment Lower Extremity Assessment: Generalized weakness       Communication   Communication: No difficulties  Cognition Arousal/Alertness: Awake/alert Behavior During Therapy: WFL for tasks assessed/performed Overall Cognitive Status: Within Functional Limits for tasks assessed                                 General Comments: patient with intermittent slow processing        General Comments      Exercises     Assessment/Plan    PT Assessment Patient needs continued PT services  PT Problem List Decreased range of motion;Decreased strength;Decreased activity tolerance;Decreased balance;Decreased mobility;Decreased safety awareness       PT Treatment Interventions DME instruction;Gait  training;Stair training;Functional mobility training;Therapeutic activities;Therapeutic exercise;Balance training;Neuromuscular re-education;Cognitive remediation;Patient/family education    PT Goals (Current goals can be found in the Care Plan section)  Acute Rehab PT Goals Patient Stated Goal: to go home PT Goal Formulation: With patient/family Time For Goal Achievement: 05/04/22 Potential to Achieve Goals: Good    Frequency Min 2X/week     Co-evaluation PT/OT/SLP Co-Evaluation/Treatment: Yes Reason for Co-Treatment: Complexity of the patient's impairments (multi-system involvement);To address functional/ADL transfers PT goals addressed during session: Mobility/safety with mobility         AM-PAC PT "6 Clicks" Mobility  Outcome Measure Help needed turning from your back to your side while in a flat bed without using bedrails?: A Little Help needed moving from lying on your back to sitting on the side of a flat bed without using bedrails?: A Lot Help needed moving to and from a bed to a chair (including a wheelchair)?: A Lot Help needed standing up from a chair using your arms (e.g., wheelchair or bedside chair)?: A Lot Help needed to walk in hospital room?: A Lot Help needed climbing 3-5 steps with a railing? : A Lot 6 Click Score: 13    End of Session Equipment Utilized During Treatment: Oxygen Activity Tolerance: Patient tolerated treatment well Patient left: in bed;with call bell/phone within reach;with bed alarm set;with family/visitor present Nurse Communication: Mobility status PT Visit Diagnosis: Unsteadiness on feet (R26.81);Muscle weakness (generalized) (M62.81)    Time: 0925-1000 PT Time Calculation (min) (ACUTE ONLY): 35 min   Charges:   PT  Evaluation $PT Re-evaluation: 1 Re-eval PT Treatments $Therapeutic Activity: 8-22 mins        Minna Merritts, PT, MPT   [name] 04/20/2022, 10:42 AM

## 2022-04-20 NOTE — Progress Notes (Signed)
   04/20/22 1400  Spiritual Encounters  Type of Visit Initial  Care provided to: Pt and family  Referral source Nurse (RN/NT/LPN)  Reason for visit Routine spiritual support  OnCall Visit No  Spiritual Framework  Patient Stress Factors Health changes   Chaplain responded to [facility] consult requesting prayer.

## 2022-04-21 ENCOUNTER — Inpatient Hospital Stay: Payer: Medicare HMO

## 2022-04-21 DIAGNOSIS — Z9889 Other specified postprocedural states: Secondary | ICD-10-CM | POA: Diagnosis not present

## 2022-04-21 DIAGNOSIS — Z8719 Personal history of other diseases of the digestive system: Secondary | ICD-10-CM | POA: Diagnosis not present

## 2022-04-21 DIAGNOSIS — I4719 Other supraventricular tachycardia: Secondary | ICD-10-CM | POA: Diagnosis not present

## 2022-04-21 DIAGNOSIS — I471 Supraventricular tachycardia, unspecified: Secondary | ICD-10-CM

## 2022-04-21 LAB — PHOSPHORUS: Phosphorus: 3.9 mg/dL (ref 2.5–4.6)

## 2022-04-21 LAB — BASIC METABOLIC PANEL
Anion gap: 7 (ref 5–15)
BUN: 16 mg/dL (ref 8–23)
CO2: 26 mmol/L (ref 22–32)
Calcium: 7.7 mg/dL — ABNORMAL LOW (ref 8.9–10.3)
Chloride: 98 mmol/L (ref 98–111)
Creatinine, Ser: 0.52 mg/dL (ref 0.44–1.00)
GFR, Estimated: >60 mL/min (ref >60)
Glucose, Bld: 129 mg/dL — ABNORMAL HIGH (ref 70–99)
Potassium: 3.4 mmol/L — ABNORMAL LOW (ref 3.5–5.1)
Sodium: 131 mmol/L — ABNORMAL LOW (ref 135–145)

## 2022-04-21 LAB — CBC
HCT: 31.4 % — ABNORMAL LOW (ref 36.0–46.0)
Hemoglobin: 10.1 g/dL — ABNORMAL LOW (ref 12.0–15.0)
MCH: 26.9 pg (ref 26.0–34.0)
MCHC: 32.2 g/dL (ref 30.0–36.0)
MCV: 83.7 fL (ref 80.0–100.0)
Platelets: 330 10*3/uL (ref 150–400)
RBC: 3.75 MIL/uL — ABNORMAL LOW (ref 3.87–5.11)
RDW: 14.7 % (ref 11.5–15.5)
WBC: 15.4 10*3/uL — ABNORMAL HIGH (ref 4.0–10.5)
nRBC: 0 % (ref 0.0–0.2)

## 2022-04-21 LAB — GLUCOSE, CAPILLARY
Glucose-Capillary: 116 mg/dL — ABNORMAL HIGH (ref 70–99)
Glucose-Capillary: 127 mg/dL — ABNORMAL HIGH (ref 70–99)
Glucose-Capillary: 134 mg/dL — ABNORMAL HIGH (ref 70–99)
Glucose-Capillary: 138 mg/dL — ABNORMAL HIGH (ref 70–99)
Glucose-Capillary: 146 mg/dL — ABNORMAL HIGH (ref 70–99)
Glucose-Capillary: 185 mg/dL — ABNORMAL HIGH (ref 70–99)

## 2022-04-21 LAB — MAGNESIUM: Magnesium: 2 mg/dL (ref 1.7–2.4)

## 2022-04-21 MED ORDER — IOHEXOL 300 MG/ML  SOLN
150.0000 mL | Freq: Once | INTRAMUSCULAR | Status: AC | PRN
  Administered 2022-04-21: 100 mL via ORAL

## 2022-04-21 MED ORDER — FREE WATER
80.0000 mL | Status: DC
  Administered 2022-04-21 – 2022-04-26 (×28): 80 mL

## 2022-04-21 NOTE — Plan of Care (Signed)
  Problem: Education: Goal: Knowledge of General Education information will improve Description: Including pain rating scale, medication(s)/side effects and non-pharmacologic comfort measures Outcome: Progressing

## 2022-04-21 NOTE — Progress Notes (Signed)
PT Cancellation Note  Patient Details Name: Kelly Wood MRN: [id number] DOB: October 09, 1940   Cancelled Treatment:    Reason Eval/Treat Not Completed: Medical issues which prohibited therapy (Resting heart rate in the upper 140's. PT will hold for now and follow up as appropriate. Discussed with OT and RN)  Minna Merritts, PT, MPT  [name] 04/21/2022, 10:46 AM

## 2022-04-21 NOTE — Care Management Important Message (Signed)
Important Message  Patient Details  Name: Kelly Wood MRN: [id number] Date of Birth: 24-May-1940   Medicare Important Message Given:  Yes     Dannette Barbara 04/21/2022, 11:00 AM

## 2022-04-21 NOTE — Progress Notes (Signed)
Inpatient Rehab Admissions Coordinator:   Consult received and chart reviewed.  Did well on therapy evaluations.  Not medically ready to consider rehab admit at this time, but we will follow for progress with therapy and medical stability.    Shann Medal, PT, DPT Admissions Coordinator [phone] 04/21/22  12:54 PM

## 2022-04-21 NOTE — Progress Notes (Addendum)
Rounding Note    Patient Name: Kelly Wood Date of Encounter: 04/21/2022  [place] Cardiologist: None   Subjective   NG tube in place, swallow eval being planned today.  Husband at bedside.  Inpatient Medications    Scheduled Meds:  Chlorhexidine Gluconate Cloth  6 each Topical Daily   Chlorhexidine Gluconate Cloth  6 each Topical Q0600   enoxaparin (LOVENOX) injection  40 mg Subcutaneous Q24H   free water  30 mL Per Tube Q4H   levothyroxine  50 mcg Intravenous Daily   metoprolol tartrate  5 mg Intravenous Q6H   pantoprazole (PROTONIX) IV  40 mg Intravenous Q12H   Continuous Infusions:  acetaminophen 1,000 mg (04/21/22 0042)   amiodarone 30 mg/hr (04/21/22 0023)   feeding supplement (OSMOLITE 1.2 CAL) 60 mL/hr at 04/20/22 1104   piperacillin-tazobactam (ZOSYN)  IV 3.375 g (04/21/22 0700)   PRN Meds: albuterol, [DISCONTINUED] diphenhydrAMINE **OR** diphenhydrAMINE, hydrALAZINE, menthol-cetylpyridinium, morphine injection, naphazoline-glycerin, oxyCODONE, phenol   Vital Signs    Vitals:   04/20/22 2300 04/21/22 0025 04/21/22 0314 04/21/22 0810  BP: (!) 152/82  (!) 154/84 (!) 160/83  Pulse: 72 76 71 66  Resp: '18  18 16  '$ Temp: 97.9 F (36.6 C)  97.7 F (36.5 C) 97.7 F (36.5 C)  TempSrc:   Oral Oral  SpO2: 95%  100% 99%  Weight:      Height:        Intake/Output Summary (Last 24 hours) at 04/21/2022 1109 Last data filed at 04/21/2022 0655 Gross per 24 hour  Intake 5844.89 ml  Output 930 ml  Net 4914.89 ml      04/20/2022    3:34 AM 04/18/2022    5:00 AM 04/17/2022    4:27 AM  Last 3 Weights  Weight (lbs) 137 lb 12.6 oz 137 lb 12.6 oz 138 lb 10.7 oz  Weight (kg) 62.5 kg 62.5 kg 62.9 kg      Telemetry    Sinus rhythm- Personally Reviewed  ECG     - Personally Reviewed  Physical Exam   GEN: No acute distress.  Appears fatigued, soft-spoken Neck: NG tube in place Cardiac: RRR, no murmurs, rubs, or gallops.  Respiratory: Diminished  breath sounds bilaterally. GI: Soft, nontender, non-distended  MS: No edema; No deformity. Neuro:  Nonfocal  Psych: Normal affect   Labs    High Sensitivity Troponin:  No results for input(s): "TROPONINIHS" in the last 720 hours.   Chemistry Recent Labs  Lab 04/17/22 [phone] 04/18/22 0616 04/19/22 0413 04/20/22 0323 04/21/22 0436  NA 134* 135 136 133* 131*  K 3.3* 3.6 4.0 3.4* 3.4*  CL 103 106 103 100 98  CO2 '24 24 25 22 26  '$ GLUCOSE 131* 149* 118* 134* 129*  BUN '21 15 15 20 16  '$ CREATININE 0.73 0.50 0.63 0.58 0.52  CALCIUM 7.5* 7.4* 7.7* 7.6* 7.7*  MG 1.8 1.9  --  1.9 2.0  PROT 5.3* 5.5* 5.8*  --   --   ALBUMIN 2.3* 2.2* 2.2*  --   --   AST 110* 61* 31  --   --   ALT 87* 78* 54*  --   --   ALKPHOS 63 111 122  --   --   BILITOT 0.2* 0.5 0.5  --   --   GFRNONAA >60 >60 >60 >60 >60  ANIONGAP '7 5 8 11 7    '$ Lipids No results for input(s): "CHOL", "TRIG", "HDL", "LABVLDL", "LDLCALC", "CHOLHDL" in the last  168 hours.  Hematology Recent Labs  Lab 04/19/22 0413 04/20/22 0323 04/21/22 0436  WBC 17.4* 11.3* 15.4*  RBC 3.94 3.71* 3.75*  HGB 10.7* 10.1* 10.1*  HCT 33.9* 30.8* 31.4*  MCV 86.0 83.0 83.7  MCH 27.2 27.2 26.9  MCHC 31.6 32.8 32.2  RDW 14.4 14.7 14.7  PLT 323 315 330   Thyroid  Recent Labs  Lab 04/18/22 0616  TSH 6.278*  FREET4 0.99    BNPNo results for input(s): "BNP", "PROBNP" in the last 168 hours.  DDimer No results for input(s): "DDIMER" in the last 168 hours.   Radiology    CT CHEST WO CONTRAST  Result Date: 04/20/2022 CLINICAL DATA:  Right pleural effusion, recent chest tube replacement, history of esophageal perforation EXAM: CT CHEST WITHOUT CONTRAST TECHNIQUE: Multidetector CT imaging of the chest was performed following the standard protocol without IV contrast. RADIATION DOSE REDUCTION: This exam was performed according to the departmental dose-optimization program which includes automated exposure control, adjustment of the mA and/or kV  according to patient size and/or use of iterative reconstruction technique. COMPARISON:  04/18/2022 FINDINGS: Cardiovascular: Unenhanced imaging of the heart is unremarkable without pericardial effusion. Normal caliber of the thoracic aorta. Stable atherosclerosis of the aorta and coronary vasculature. Mediastinum/Nodes: Enteric catheter extends into the gastric lumen, tip excluded by slice selection. Nissen fundoplication and hiatal hernia are stable. Evaluation of the surgical site is limited without intravenous contrast. Thyroid and trachea are unremarkable. Multiple borderline enlarged mediastinal and hilar lymph nodes are likely reactive. Lungs/Pleura: Interval replacement of the right chest tube, now coiled within the free-flowing right pleural effusion. Overall, no significant change in pleural effusion volume since the prior study. Minimal gas within the right pleural space consistent with recent intervention. Stable dependent right lung atelectasis. Small left pleural effusion and left basilar atelectasis are unchanged. Severe background emphysema again noted. Upper Abdomen: No acute abnormality. Musculoskeletal: No acute or destructive bony lesions. Reconstructed images demonstrate no additional findings. IMPRESSION: 1. Interval replacement of the right chest tube, with new pigtail drainage catheter traversing the right pleural effusion. No change in right pleural effusion volume. Minimal gas in the right pleural space consistent with recent intervention. 2. Stable small left pleural effusion. 3. Emphysema, with stable dependent lower lobe atelectasis. 4. Postsurgical changes at the gastroesophageal junction, with stable enteric catheter and hiatal hernia. Evaluation of the surgical site is limited without intravenous contrast. 5. Aortic Atherosclerosis (ICD10-I70.0). Coronary artery atherosclerosis. Electronically Signed   By: Randa Ngo M.D.   On: 04/20/2022 16:11   IR PERC PLEURAL DRAIN W/INDWELL  CATH W/IMG GUIDE  Result Date: 04/20/2022 INDICATION: 82 year old woman with right hydropneumothorax related to esophageal leak. Patient has a right chest tube in place which terminates anteriorly. It has resolved the pneumothorax, however fluid remains within the dependent portion of the chest. IR consulted for removal of right anterior chest tube and placement of right posterior chest tube for drainage of remaining fluid. EXAM: 1. Ultrasound-guided right chest tube placement (14 [place]) 2. Right anterior chest tube removal. MEDICATIONS: 25 mcg IV fentanyl ANESTHESIA/SEDATION: None COMPLICATIONS: None immediate. PROCEDURE: Informed written consent was obtained from the patient after a thorough discussion of the procedural risks, benefits and alternatives. All questions were addressed. Maximal Sterile Barrier Technique was utilized including caps, mask, sterile gowns, sterile gloves, sterile drape, hand hygiene and skin antiseptic. A timeout was performed prior to the initiation of the procedure. Patient positioned supine on the procedure table. Ultrasound evaluation of the right chest demonstrated a  small pleural effusion. The overlying skin was prepped and draped in sterile fashion. Following local lidocaine administration, the pleural space was accessed with a 19 gauge Yueh needle. Straw-colored fluid was seen returning from the needle hub. The Yueh needle was removed over 0.035 inch guidewire. Serial dilation was performed and 14 French pigtail catheter was inserted over the guidewire. The new drain was connected to pleura vac set at -20 cm H2O. The drain was secured to skin with suture and covered with sterile dressing. The right anterior chest tube was then removed without difficulty. The insertion site was covered with Vaseline gauze and sterile dressing. IMPRESSION: Successful ultrasound-guided insertion of right chest tube (14 [place]). Electronically Signed   By: Miachel Roux M.D.   On: 04/20/2022 08:55    DG Chest Port 1 View  Result Date: 04/19/2022 CLINICAL DATA:  Chest tube placement EXAM: PORTABLE CHEST 1 VIEW COMPARISON:  04/18/2022 FINDINGS: Single frontal view of the chest was performed. There has been replacement of the right-sided chest tube, with new pigtail drainage catheter coiled over the right lateral mid chest. Decreased right pleural effusion after chest tube repositioning. Minimal gas at the right costophrenic angle likely due to recent intervention. Stable areas of bilateral consolidation and left pleural effusion. Enteric catheter unchanged. Cardiac silhouette remains enlarged. IMPRESSION: 1. Interval replacement of the right pigtail drainage catheter, with decreased right pleural effusion after catheter replacement. Trace amount of gas within the right pleural space likely due to recent intervention. 2. Stable bilateral consolidation and left pleural effusion. Electronically Signed   By: Randa Ngo M.D.   On: 04/19/2022 16:58    Cardiac Studies   TTE 04/2022  1. Left ventricular ejection fraction, by estimation, is 55 to 60%. The  left ventricle has normal function. Left ventricular endocardial border  not optimally defined to evaluate regional wall motion. Left ventricular  diastolic parameters are consistent  with Grade I diastolic dysfunction (impaired relaxation).   2. Right ventricular systolic function is normal. The right ventricular  size is normal. There is moderately elevated pulmonary artery systolic  pressure. The estimated right ventricular systolic pressure is 63.1 mmHg.   3. Left atrial size was mildly dilated.   4. The mitral valve is normal in structure. Mild mitral valve  regurgitation. No evidence of mitral stenosis. Moderate mitral annular  calcification.   5. The aortic valve is normal in structure. Aortic valve regurgitation is  not visualized. Aortic valve sclerosis is present, with no evidence of  aortic valve stenosis.   Patient Profile      82 y.o. female with history of hypertension, COPD, paraesophageal hernia s/p repair.  SVT being seen for SVT.  Assessment & Plan    SVT -Maintaining sinus rhythm on IV amiodarone -Continue amiodarone drip -Start AV nodal agent when able to take p.o. -Was previously on Cardizem CD 240 mg daily at home.  Reasonable to restart this.  2.  Esophageal hernia s/p repair -Management as per surgical team     Signed, Kate Sable, MD  04/21/2022, 11:09 AM

## 2022-04-21 NOTE — Progress Notes (Signed)
Pt Tylenol was requested from pharmacy but medicine was not available yet. Incoming shift made aware. Will continue to monitor.

## 2022-04-21 NOTE — Progress Notes (Signed)
Supervising Physician: Daryll Brod  Patient Status:  [facility] - In-pt  Chief Complaint:  2 days s/p right chest tube placement for effusion related to post-operative paraesophageal hernia repair pleural effusion  Subjective:  Pt reclined in bed.  Husband at bedside.  Pleasant.  Reports today "is not a good day".    Allergies: Ativan [lorazepam], Azithromycin, Levofloxacin, and Sulfa antibiotics  Medications: Prior to Admission medications   Medication Sig Start Date End Date Taking? Authorizing Provider  albuterol (VENTOLIN HFA) 108 (90 Base) MCG/ACT inhaler Inhale 2 puffs into the lungs every 6 (six) hours as needed for wheezing or shortness of breath.   Yes [provider]  alendronate (FOSAMAX) 70 MG tablet Take 70 mg by mouth once a week. Mondays 11/03/18  Yes [provider]  aspirin EC 81 MG tablet Take 81 mg by mouth daily.   Yes [provider]  bisoprolol-hydrochlorothiazide (ZIAC) 5-6.25 MG tablet Take 1 tablet by mouth daily.   Yes [provider]  diltiazem (CARDIZEM CD) 240 MG 24 hr capsule Take 240 mg by mouth daily with supper.   Yes [provider]  ibuprofen (ADVIL) 200 MG tablet Take 400 mg by mouth every 6 (six) hours as needed for mild pain.   Yes [provider]  levothyroxine (SYNTHROID) 75 MCG tablet Take 75 mcg by mouth daily before breakfast. Take on an empty stomach with a glass of water at least 30-60 minutes before breakfast 10/23/18 04/15/22 Yes [provider]  Melatonin 5 MG CAPS Take 5 mg by mouth daily as needed (sleep).   Yes [provider]  Multiple Vitamin (MULTIVITAMIN WITH MINERALS) TABS tablet Take 1 tablet by mouth daily.   Yes [provider]  pantoprazole (PROTONIX) 40 MG tablet Take 40 mg by mouth 2 (two) times daily. 01/18/22  Yes [provider]  pravastatin (PRAVACHOL) 80 MG tablet Take 80 mg by mouth daily.   Yes [provider]  Probiotic  Product (PROBIOTIC DAILY PO) Take 1 capsule by mouth daily.   Yes [provider]  tetrahydrozoline 0.05 % ophthalmic solution Place 1 drop into both eyes 2 (two) times daily as needed (irritated eyes (VISINE)).   Yes [provider]  Turmeric 500 MG TABS Take 500 mg by mouth daily.   Yes [provider]  fluocinonide gel (LIDEX) 0.62 % Apply 1 application topically daily as needed (eczema). 02/17/21   [provider]  furosemide (LASIX) 40 MG tablet Take 40 mg by mouth daily as needed for edema. 03/10/21   [provider]  metoCLOPramide (REGLAN) 5 MG tablet Take 1 tablet (5 mg total) by mouth every 8 (eight) hours as needed for nausea. 10/12/21   Pabon, Diego F, MD  ondansetron (ZOFRAN) 4 MG tablet Take 1 tablet (4 mg total) by mouth every 8 (eight) hours as needed for nausea or vomiting. 03/08/22   Pabon, Marjory Lies, MD     Vital Signs: BP (!) 160/83 (BP Location: Left Arm)   Pulse 66   Temp 97.7 F (36.5 C) (Oral)   Resp 16   Ht 5' (1.524 m)   Wt 137 lb 12.6 oz (62.5 kg)   SpO2 99%   BMI 26.91 kg/m   Physical Exam Constitutional:      Appearance: She is ill-appearing.  HENT:     Head: Normocephalic and atraumatic.     Mouth/Throat:     Pharynx: Oropharynx is clear.  Cardiovascular:     Rate and  Rhythm: Tachycardia present.  Pulmonary:     Comments: Mildly labored Skin:    Comments: Warm, clammy  Neurological:     General: No focal deficit present.     Mental Status: She is alert and oriented to person, place, and time.  Psychiatric:        Mood and Affect: Mood normal.        Behavior: Behavior normal.   Drain Location:  Size: Fr size: 14 Fr Date of placement: 04/19/22 Currently to: Pleura vac Output: 300cc serosanguinous output thus far.  Current examination: Insertion site unremarkable. Suture and stat lock in place. Dressed appropriately.   Imaging: CT CHEST WO CONTRAST  Result Date: 04/20/2022 CLINICAL DATA:  Right  pleural effusion, recent chest tube replacement, history of esophageal perforation EXAM: CT CHEST WITHOUT CONTRAST TECHNIQUE: Multidetector CT imaging of the chest was performed following the standard protocol without IV contrast. RADIATION DOSE REDUCTION: This exam was performed according to the departmental dose-optimization program which includes automated exposure control, adjustment of the mA and/or kV according to patient size and/or use of iterative reconstruction technique. COMPARISON:  04/18/2022 FINDINGS: Cardiovascular: Unenhanced imaging of the heart is unremarkable without pericardial effusion. Normal caliber of the thoracic aorta. Stable atherosclerosis of the aorta and coronary vasculature. Mediastinum/Nodes: Enteric catheter extends into the gastric lumen, tip excluded by slice selection. Nissen fundoplication and hiatal hernia are stable. Evaluation of the surgical site is limited without intravenous contrast. Thyroid and trachea are unremarkable. Multiple borderline enlarged mediastinal and hilar lymph nodes are likely reactive. Lungs/Pleura: Interval replacement of the right chest tube, now coiled within the free-flowing right pleural effusion. Overall, no significant change in pleural effusion volume since the prior study. Minimal gas within the right pleural space consistent with recent intervention. Stable dependent right lung atelectasis. Small left pleural effusion and left basilar atelectasis are unchanged. Severe background emphysema again noted. Upper Abdomen: No acute abnormality. Musculoskeletal: No acute or destructive bony lesions. Reconstructed images demonstrate no additional findings. IMPRESSION: 1. Interval replacement of the right chest tube, with new pigtail drainage catheter traversing the right pleural effusion. No change in right pleural effusion volume. Minimal gas in the right pleural space consistent with recent intervention. 2. Stable small left pleural effusion. 3.  Emphysema, with stable dependent lower lobe atelectasis. 4. Postsurgical changes at the gastroesophageal junction, with stable enteric catheter and hiatal hernia. Evaluation of the surgical site is limited without intravenous contrast. 5. Aortic Atherosclerosis (ICD10-I70.0). Coronary artery atherosclerosis. Electronically Signed   By: Randa Ngo M.D.   On: 04/20/2022 16:11   IR PERC PLEURAL DRAIN W/INDWELL CATH W/IMG GUIDE  Result Date: 04/20/2022 INDICATION: 82 year old woman with right hydropneumothorax related to esophageal leak. Patient has a right chest tube in place which terminates anteriorly. It has resolved the pneumothorax, however fluid remains within the dependent portion of the chest. IR consulted for removal of right anterior chest tube and placement of right posterior chest tube for drainage of remaining fluid. EXAM: 1. Ultrasound-guided right chest tube placement (14 [place]) 2. Right anterior chest tube removal. MEDICATIONS: 25 mcg IV fentanyl ANESTHESIA/SEDATION: None COMPLICATIONS: None immediate. PROCEDURE: Informed written consent was obtained from the patient after a thorough discussion of the procedural risks, benefits and alternatives. All questions were addressed. Maximal Sterile Barrier Technique was utilized including caps, mask, sterile gowns, sterile gloves, sterile drape, hand hygiene and skin antiseptic. A timeout was performed prior to the initiation of the procedure. Patient positioned supine on the procedure table. Ultrasound evaluation of the  right chest demonstrated a small pleural effusion. The overlying skin was prepped and draped in sterile fashion. Following local lidocaine administration, the pleural space was accessed with a 19 gauge Yueh needle. Straw-colored fluid was seen returning from the needle hub. The Yueh needle was removed over 0.035 inch guidewire. Serial dilation was performed and 14 French pigtail catheter was inserted over the guidewire. The new drain  was connected to pleura vac set at -20 cm H2O. The drain was secured to skin with suture and covered with sterile dressing. The right anterior chest tube was then removed without difficulty. The insertion site was covered with Vaseline gauze and sterile dressing. IMPRESSION: Successful ultrasound-guided insertion of right chest tube (14 [place]). Electronically Signed   By: Miachel Roux M.D.   On: 04/20/2022 08:55   DG Chest Port 1 View  Result Date: 04/19/2022 CLINICAL DATA:  Chest tube placement EXAM: PORTABLE CHEST 1 VIEW COMPARISON:  04/18/2022 FINDINGS: Single frontal view of the chest was performed. There has been replacement of the right-sided chest tube, with new pigtail drainage catheter coiled over the right lateral mid chest. Decreased right pleural effusion after chest tube repositioning. Minimal gas at the right costophrenic angle likely due to recent intervention. Stable areas of bilateral consolidation and left pleural effusion. Enteric catheter unchanged. Cardiac silhouette remains enlarged. IMPRESSION: 1. Interval replacement of the right pigtail drainage catheter, with decreased right pleural effusion after catheter replacement. Trace amount of gas within the right pleural space likely due to recent intervention. 2. Stable bilateral consolidation and left pleural effusion. Electronically Signed   By: Randa Ngo M.D.   On: 04/19/2022 16:58   ECHOCARDIOGRAM COMPLETE  Result Date: 04/19/2022    ECHOCARDIOGRAM REPORT   Patient Name:   Kelly Wood [facility] Date of Exam: 04/19/2022 Medical Rec #:  [id number]       Height:       60.0 in Accession #:    [id number]      Weight:       137.8 lb Date of Birth:  [birth year]       BSA:          1.593 m Patient Age:    82 years        BP:           136/77 mmHg Patient Gender: F               HR:           98 bpm. Exam Location:  [facility] Procedure: 2D Echo, Cardiac Doppler and Color Doppler Indications:     Atrial Flutter I48.92  History:         Patient has no  prior history of Echocardiogram examinations.                  COPD; Risk Factors:Hypertension.  Sonographer:     Sherrie Sport Referring Phys:  [place] Diagnosing Phys: Kathlyn Sacramento MD  Sonographer Comments: Technically difficult study due to poor echo windows. IMPRESSIONS  1. Left ventricular ejection fraction, by estimation, is 55 to 60%. The left ventricle has normal function. Left ventricular endocardial border not optimally defined to evaluate regional wall motion. Left ventricular diastolic parameters are consistent with Grade I diastolic dysfunction (impaired relaxation).  2. Right ventricular systolic function is normal. The right ventricular size is normal. There is moderately elevated pulmonary artery systolic pressure. The estimated right ventricular systolic pressure is 54.2 mmHg.  3. Left atrial size was mildly dilated.  4. The mitral valve is normal in structure. Mild mitral valve regurgitation. No evidence of mitral stenosis. Moderate mitral annular calcification.  5. The aortic valve is normal in structure. Aortic valve regurgitation is not visualized. Aortic valve sclerosis is present, with no evidence of aortic valve stenosis. FINDINGS  Left Ventricle: Left ventricular ejection fraction, by estimation, is 55 to 60%. The left ventricle has normal function. Left ventricular endocardial border not optimally defined to evaluate regional wall motion. The left ventricular internal cavity size was normal in size. There is no left ventricular hypertrophy. Left ventricular diastolic parameters are consistent with Grade I diastolic dysfunction (impaired relaxation). Right Ventricle: The right ventricular size is normal. No increase in right ventricular wall thickness. Right ventricular systolic function is normal. There is moderately elevated pulmonary artery systolic pressure. The tricuspid regurgitant velocity is 3.46 m/s, and with an assumed right atrial pressure of 5 mmHg, the estimated  right ventricular systolic pressure is 19.5 mmHg. Left Atrium: Left atrial size was mildly dilated. Right Atrium: Right atrial size was normal in size. Pericardium: There is no evidence of pericardial effusion. Mitral Valve: The mitral valve is normal in structure. Moderate mitral annular calcification. Mild mitral valve regurgitation. No evidence of mitral valve stenosis. Tricuspid Valve: The tricuspid valve is normal in structure. Tricuspid valve regurgitation is mild . No evidence of tricuspid stenosis. Aortic Valve: The aortic valve is normal in structure. Aortic valve regurgitation is not visualized. Aortic valve sclerosis is present, with no evidence of aortic valve stenosis. Aortic valve mean gradient measures 2.5 mmHg. Aortic valve peak gradient measures 4.8 mmHg. Aortic valve area, by VTI measures 3.79 cm. Pulmonic Valve: The pulmonic valve was normal in structure. Pulmonic valve regurgitation is not visualized. No evidence of pulmonic stenosis. Aorta: The aortic root is normal in size and structure. Venous: The inferior vena cava was not well visualized. IAS/Shunts: No atrial level shunt detected by color flow Doppler.  LEFT VENTRICLE PLAX 2D LVIDd:         4.10 cm   Diastology LVIDs:         3.00 cm   LV e' medial:    4.79 cm/s LV PW:         1.00 cm   LV E/e' medial:  11.4 LV IVS:        1.00 cm   LV e' lateral:   6.64 cm/s LVOT diam:     2.00 cm   LV E/e' lateral: 8.2 LV SV:         66 LV SV Index:   42 LVOT Area:     3.14 cm  RIGHT VENTRICLE RV Basal diam:  3.40 cm RV Mid diam:    3.10 cm RV S prime:     17.80 cm/s TAPSE (M-mode): 2.4 cm LEFT ATRIUM             Index        RIGHT ATRIUM           Index LA diam:        3.80 cm 2.39 cm/m   RA Area:     11.20 cm LA Vol (A2C):   51.5 ml 32.32 ml/m  RA Volume:   20.60 ml  12.93 ml/m LA Vol (A4C):   41.8 ml 26.24 ml/m LA Biplane Vol: 50.5 ml 31.70 ml/m  AORTIC VALVE AV Area (Vmax):    3.03 cm AV Area (Vmean):   3.10 cm AV Area (VTI):     3.79 cm  AV Vmax:           110.00 cm/s AV Vmean:          73.300 cm/s AV VTI:            0.175 m AV Peak Grad:      4.8 mmHg AV Mean Grad:      2.5 mmHg LVOT Vmax:         106.00 cm/s LVOT Vmean:        72.400 cm/s LVOT VTI:          0.211 m LVOT/AV VTI ratio: 1.21  AORTA Ao Root diam: 2.70 cm MITRAL VALVE               TRICUSPID VALVE MV Area (PHT): 6.37 cm    TR Peak grad:   47.9 mmHg MV Decel Time: 119 msec    TR Vmax:        346.00 cm/s MV E velocity: 54.50 cm/s MV A velocity: 94.90 cm/s  SHUNTS MV E/A ratio:  0.57        Systemic VTI:  0.21 m                            Systemic Diam: 2.00 cm Kathlyn Sacramento MD Electronically signed by Kathlyn Sacramento MD Signature Date/Time: 04/19/2022/1:40:45 PM    Final    CT CHEST ABDOMEN PELVIS W CONTRAST  Result Date: 04/18/2022 CLINICAL DATA:  Paraesophageal hernia repair complicated by perforation EXAM: CT CHEST, ABDOMEN, AND PELVIS WITH CONTRAST TECHNIQUE: Multidetector CT imaging of the chest, abdomen and pelvis was performed following the standard protocol during bolus administration of intravenous contrast. RADIATION DOSE REDUCTION: This exam was performed according to the departmental dose-optimization program which includes automated exposure control, adjustment of the mA and/or kV according to patient size and/or use of iterative reconstruction technique. CONTRAST:  188m OMNIPAQUE IOHEXOL 350 MG/ML SOLN COMPARISON:  04/15/2022 FINDINGS: CT CHEST FINDINGS Cardiovascular: The heart is unremarkable without pericardial effusion. No evidence of thoracic aortic aneurysm or dissection. Atheromatous plaque within the descending thoracic aorta. Diffuse atherosclerosis of the aorta and coronary vasculature. Mediastinum/Nodes: Minimal gas within the anterior mediastinum consistent with recent surgical intervention. Enteric catheter extends into the gastric lumen. Thyroid and trachea are unremarkable. Previous Nissen fundoplication, with small hiatal hernia again identified.  Loculated fluid along the right lateral aspect of the hiatal hernia sac, measuring 4.0 x 2.3 cm, decreased since prior study. There is intraluminal gas within the hiatal hernia at site of prior Nissen fundoplication. No extraluminal gas is seen within the lower mediastinum. Lungs/Pleura: Moderate bilateral pleural effusions, right greater than left. Dependent lower lobe atelectasis. Background emphysema. Right chest tube along the right mediastinal margin. No pneumothorax. Musculoskeletal: No acute or destructive bony lesions. Subcutaneous gas within the chest wall consistent with recent surgical intervention. Reconstructed images demonstrate no additional findings. CT ABDOMEN PELVIS FINDINGS Hepatobiliary: No focal liver abnormality is seen. No gallstones, gallbladder wall thickening, or biliary dilatation. Pancreas: Unremarkable. No pancreatic ductal dilatation or surrounding inflammatory changes. Spleen: Normal in size without focal abnormality. Adrenals/Urinary Tract: Adrenal glands are unremarkable. Kidneys are normal, without renal calculi, focal lesion, or hydronephrosis. Bladder is decompressed with a Foley catheter. Stomach/Bowel: No bowel obstruction or ileus. Diffuse colonic diverticulosis without evidence of diverticulitis. Normal appendix right lower quadrant. Previous Nissen fundoplication, with small hiatal hernia again noted. Enteric catheter extends into the gastric lumen. There is a percutaneous jejunostomy tube, tip within the right  mid abdomen within the mid jejunum. Vascular/Lymphatic: Aortic atherosclerosis. No enlarged abdominal or pelvic lymph nodes. Reproductive: Uterus and bilateral adnexa are unremarkable. Other: Surgical drain enters the abdomen in the left paraumbilical region, and extends superiorly through the sub hepatic region with tip residing at the diaphragmatic hiatus. There is no free fluid or free intraperitoneal gas. No abdominal wall hernia. Musculoskeletal: No acute or  destructive bony lesions. Postsurgical changes right hip. Reconstructed images demonstrate no additional findings. IMPRESSION: 1. Postsurgical changes related to interval esophageal perforation repair. Surgical drain tip at the gastroesophageal junction at the diaphragmatic hiatus, with 4.0 x 2.3 cm residual fluid collection along the right lateral margin of the distal esophagus. No extraluminal gas to suggest persistent or recurrent perforation. 2. Moderate bilateral pleural effusions and dependent lower lobe atelectasis, right greater than left. Right chest tube in place, with no residual or recurrent pneumothorax. 3. Enteric catheter and percutaneous jejunostomy tube as above. 4. Colonic diverticulosis without diverticulitis. 5.  Aortic Atherosclerosis (ICD10-I70.0). Electronically Signed   By: Randa Ngo M.D.   On: 04/18/2022 17:45   DG Chest Port 1 View  Result Date: 04/18/2022 CLINICAL DATA:  Short of breath, status post repair of paraesophageal hernia EXAM: PORTABLE CHEST 1 VIEW COMPARISON:  Prior chest x-ray 04/16/2022 FINDINGS: Right-sided pigtail thoracostomy tube in unchanged position. Gastric tube remains present. The proximal side-hole overlies the upper stomach. A surgical drain is present overlying the mid epigastrium. Stable cardiomegaly. Atherosclerotic calcifications in the transverse aorta. Increasing right-sided pleural effusion with a somewhat loculated appearance. Pulmonary vascular congestion with mild interstitial edema. Persistent dense left basilar airspace opacity. IMPRESSION: 1. Enlarging loculated right pleural effusion despite the presence of the pigtail thoracostomy tube. 2. Cardiomegaly with mild pulmonary edema. 3. Unchanged support apparatus. Electronically Signed   By: Jacqulynn Cadet M.D.   On: 04/18/2022 08:46    Labs:  CBC: Recent Labs    04/17/22 0632 04/19/22 0413 04/20/22 0323 04/21/22 0436  WBC 15.3* 17.4* 11.3* 15.4*  HGB 9.8* 10.7* 10.1* 10.1*  HCT  31.2* 33.9* 30.8* 31.4*  PLT 254 323 315 330    COAGS: No results for input(s): "INR", "APTT" in the last 8760 hours.  BMP: Recent Labs    04/18/22 0616 04/19/22 0413 04/20/22 0323 04/21/22 0436  NA 135 136 133* 131*  K 3.6 4.0 3.4* 3.4*  CL 106 103 100 98  CO2 '24 25 22 26  '$ GLUCOSE 149* 118* 134* 129*  BUN '15 15 20 16  '$ CALCIUM 7.4* 7.7* 7.6* 7.7*  CREATININE 0.50 0.63 0.58 0.52  GFRNONAA >60 >60 >60 >60    LIVER FUNCTION TESTS: Recent Labs    04/17/22 0632 04/18/22 0616 04/19/22 0413  BILITOT 0.2* 0.5 0.5  AST 110* 61* 31  ALT 87* 78* 54*  ALKPHOS 63 111 122  PROT 5.3* 5.5* 5.8*  ALBUMIN 2.3* 2.2* 2.2*    Assessment and Plan:  2 days s/p placement of 14 fr pigtail catheter into right pleura with 300cc serosanguinous OP thus far --WBC up to 15.4 from 11.3 yesterday --Tachy in the 150s at time of visit. --Tube appears appropriately positioned on yesterday CT. --Low suspicion that pleural fluid would be cause for increase in WBC or tachycardia. --Patient looks ill, reports feeling worse today. Will defer to primary team to determine additional needed workup. --Surgery to manage tube. --IR available as needed.   Electronically Signed: Pasty Spillers, PA 04/21/2022, 11:18 AM   I spent a total of 15 Minutes at the the patient's  bedside AND on the patient's hospital floor or unit, greater than 50% of which was counseling/coordinating care for pleural tube.

## 2022-04-21 NOTE — Progress Notes (Signed)
[facility] Day(s): 5.   Post op day(s): 6 Days Post-Op.   Interval History:  Patient seen and examined No issues overnight; now out of ICU HR controlled on IV amiodarone  Patient slightly more depressed this morning; had a long night No significant abdominal pain; no CP/SOB No fever, chills, nausea, emesis Minimal incisional soreness Leukocytosis up this morning; 15.4K Hgb stable at 10.1 Renal function normal; sCr - 0.52; UO - 1125 ccs Mild hypokalemia to 3.4; stable NGT in place; output minimal; 100 ccs Drain present; mediastinal; 0 ccs; output more serosanguinous Chest tube present; right; ~120 ccs in pleurovac; serous; no air leak NPO Continues on Zosyn   Vital signs in last 24 hours: [min-max] current  Temp:  [97.5 F (36.4 C)-97.9 F (36.6 C)] 97.7 F (36.5 C) (01/17 0314) Pulse Rate:  [66-86] 71 (01/17 0314) Resp:  [17-26] 18 (01/17 0314) BP: (137-159)/(79-99) 154/84 (01/17 0314) SpO2:  [95 %-100 %] 100 % (01/17 0314)     Height: 5' (152.4 cm) Weight: 62.5 kg BMI (Calculated): 26.91   Intake/Output last 2 shifts:  01/16 0701 - 01/17 0700 In: 6453.6 [I.V.:233.2; QB/HA:1937.9; IV Piggyback:200] Out: 0240 [Urine:1125; Emesis/NG output:100; Drains:120; Chest Tube:120]   Physical Exam:  Constitutional: alert, cooperative and no distress  HEENT: NGT in place Chest: Right sided chest tube; site CDI; minimal serous output; no air leak Respiratory: breathing non-labored at rest; on [place] Cardiovascular: regular rate and sinus rhythm  Gastrointestinal: soft, non-tender, and non-distended, no rebound/guarding. Mediastinal drain; minimal serosanguinous output Genitourinary: Foley in place; good UO Integumentary: Laparoscopic incisions are CDI; no erythema  Labs:     Latest Ref Rng & Units 04/21/2022    4:36 AM 04/20/2022    3:23 AM 04/19/2022    4:13 AM  CBC  WBC 4.0 - 10.5 K/uL 15.4  11.3  17.4   Hemoglobin 12.0 - 15.0  g/dL 10.1  10.1  10.7   Hematocrit 36.0 - 46.0 % 31.4  30.8  33.9   Platelets 150 - 400 K/uL 330  315  323       Latest Ref Rng & Units 04/21/2022    4:36 AM 04/20/2022    3:23 AM 04/19/2022    4:13 AM  CMP  Glucose 70 - 99 mg/dL 129  134  118   BUN 8 - 23 mg/dL '16  20  15   '$ Creatinine 0.44 - 1.00 mg/dL 0.52  0.58  0.63   Sodium 135 - 145 mmol/L 131  133  136   Potassium 3.5 - 5.1 mmol/L 3.4  3.4  4.0   Chloride 98 - 111 mmol/L 98  100  103   CO2 22 - 32 mmol/L '26  22  25   '$ Calcium 8.9 - 10.3 mg/dL 7.7  7.6  7.7   Total Protein 6.5 - 8.1 g/dL   5.8   Total Bilirubin 0.3 - 1.2 mg/dL   0.5   Alkaline Phos 38 - 126 U/L   122   AST 15 - 41 U/L   31   ALT 0 - 44 U/L   54     Imaging studies: No new pertinent imaging studies   Assessment/Plan:  82 y.o. female 6 Days Post-Op s/p robotic assisted laparoscopic paraesophageal hernia repair complicated by esophageal leak requiring immediate take back for robotic assisted primary repair of esophageal perforation, placement of mediastinal drain, feeding jejunostomy placement, and right chest tube placements.    - UGI today  to reassess repair; will follow up results and determine need for NPO/NGT  - Continue strict NPO status for now; okay for mouth swabs, hard candies, gum  - Okay to advance enteric feeding via jejunostomy today; liquids only; NO PILLS via jejunostomy  - Continue chest tube; monitor and record output - Continue NGT decompression; LIS; monitor and record output - Continue foley for now; patient preference  - Continue Blake drain in mediastinum; monitor and record output  - Continue IV Abx (Zosyn)   - Monitor abdominal examination - Pain control prn; antiemetics prn - Can mobilize with therapies; current recommendations are inpatient rehab -Appreciate medicine/PCCM for assistance  All of the above findings and recommendations were discussed with the patient, patient's family, and the medical team, and all of patient's and  family's questions were answered to their expressed satisfaction.  -- Edison Simon, PA-C [facility] 04/21/2022, 7:30 AM M-F: 7am - 4pm

## 2022-04-21 NOTE — Progress Notes (Signed)
Nutrition Follow-up  DOCUMENTATION CODES:   Not applicable  INTERVENTION:   TF via j-tube:  Osmolite 1.2 @ 30 ml/hr and increase by 10 ml every 8 hours to goal rate of 60 ml/hr.   80 ml free water flush every 4 hours  Tube feeding regimen provides 1728 kcal (100% of needs), 80 grams of protein, and 1181 ml of H2O.  Total free water: 1661 ml daily  NUTRITION DIAGNOSIS:   Increased nutrient needs related to post-op healing as evidenced by estimated needs.  Ongoing  GOAL:   Patient will meet greater than or equal to 90% of their needs  Progressing   MONITOR:   Diet advancement, Labs, Weight trends, TF tolerance, I & O's, Skin  REASON FOR ASSESSMENT:   Consult Enteral/tube feeding initiation and management, Assessment of nutrition requirement/status  ASSESSMENT:   82 y/o female with h/o GERD, HTN, shingles, thyroid disease, HTN, hiatal hernia s/p Nissen fundiplocation and paraesophageal hernia repair 04/07/2021, COPD, B12 deficiency, IDA, hip fracture s/p repair 2020 and lumbar spondlyosis who is admitted with recurrent hiatal hernia s/p robotic assisted laparoscopic repair of  paraesophageal hernia with Bio-A Mesh and Nissen fundoplication, redo 5/45 complicated by esophageal perforation now s/p robotic assisted primary repair of esophageal perforation 1/11 (with chest tube placement, jejunostomy tube placement, blake drain placement, removal of BioA mesh and omental flap).  1/11- s/p rt chest tube placement, robotic assisted primary repair to esophageal perforation, robotic assisted jejunostomy tube, robotic mediastinal blake drain placement, removal of BioA mesh, and omental flap  1/15- s/p rt chest tube placement  Reviewed I/O's: +5 L x 24 hours and +8.8 L since admission  UOP: 1.1 L x 24 hours  NGT output: 100 ml x 24 hours  Chest tube output: 120 ml x 24 hours   Pt lying in bed at time of visit. Noted pt with NGT connected to suction. Pt did not awaken when name  was called.   TF currently receiving Osmolite 1.2 @ 30 ml/hr.   Per general surgery notes, plan for UGI today.    Per CIR notes, pt not medically ready for CIR, but continuing to monitor pt's progress.   Medications reviewed.   Labs reviewed: Na: 131, K: 3.4, CBGS: 116-185 (inpatient orders for glycemic control are none).    Diet Order:   Diet Order             Diet NPO time specified Except for: Other (See Comments)  Diet effective now                   EDUCATION NEEDS:   Education needs have been addressed  Skin:  Skin Assessment: Skin Integrity Issues: Skin Integrity Issues:: Incisions Incisions: closed abdomen, s/p j-tube placement  Last BM:  04/21/22 (type 7)  Height:   Ht Readings from Last 1 Encounters:  04/15/22 5' (1.524 m)    Weight:   Wt Readings from Last 1 Encounters:  04/20/22 62.5 kg    Ideal Body Weight:  45.5 kg  BMI:  Body mass index is 26.91 kg/m.  Estimated Nutritional Needs:   Kcal:  1650-1850  Protein:  85-100 grams  Fluid:  > 1.6 L    Loistine Chance, RD, LDN, [place] Registered Dietitian II Certified Diabetes Care and Education Specialist Please refer to [facility] for RD and/or RD on-call/weekend/after hours pager

## 2022-04-21 NOTE — Progress Notes (Signed)
OT Cancellation Note  Patient Details Name: Kelly Wood MRN: [id number] DOB: [birth year]   Cancelled Treatment:    Reason Eval/Treat Not Completed: Other (comment) (Pt resting HR up to 150s, OT tx deferred on this date by RN and therapist. OT will reattempt when able and medically approrpiate.)  Shanon Payor, OTD OTR/L  04/21/22, 11:18 AM

## 2022-04-22 DIAGNOSIS — I4719 Other supraventricular tachycardia: Secondary | ICD-10-CM

## 2022-04-22 DIAGNOSIS — K449 Diaphragmatic hernia without obstruction or gangrene: Secondary | ICD-10-CM

## 2022-04-22 DIAGNOSIS — Z9889 Other specified postprocedural states: Secondary | ICD-10-CM | POA: Diagnosis not present

## 2022-04-22 DIAGNOSIS — K223 Perforation of esophagus: Secondary | ICD-10-CM

## 2022-04-22 LAB — GLUCOSE, CAPILLARY
Glucose-Capillary: 119 mg/dL — ABNORMAL HIGH (ref 70–99)
Glucose-Capillary: 123 mg/dL — ABNORMAL HIGH (ref 70–99)
Glucose-Capillary: 145 mg/dL — ABNORMAL HIGH (ref 70–99)
Glucose-Capillary: 152 mg/dL — ABNORMAL HIGH (ref 70–99)
Glucose-Capillary: 159 mg/dL — ABNORMAL HIGH (ref 70–99)
Glucose-Capillary: 165 mg/dL — ABNORMAL HIGH (ref 70–99)

## 2022-04-22 LAB — MAGNESIUM: Magnesium: 2 mg/dL (ref 1.7–2.4)

## 2022-04-22 LAB — BASIC METABOLIC PANEL
Anion gap: 8 (ref 5–15)
BUN: 20 mg/dL (ref 8–23)
CO2: 26 mmol/L (ref 22–32)
Calcium: 7.4 mg/dL — ABNORMAL LOW (ref 8.9–10.3)
Chloride: 94 mmol/L — ABNORMAL LOW (ref 98–111)
Creatinine, Ser: 0.42 mg/dL — ABNORMAL LOW (ref 0.44–1.00)
GFR, Estimated: >60 mL/min (ref >60)
Glucose, Bld: 165 mg/dL — ABNORMAL HIGH (ref 70–99)
Potassium: 3.5 mmol/L (ref 3.5–5.1)
Sodium: 128 mmol/L — ABNORMAL LOW (ref 135–145)

## 2022-04-22 LAB — PHOSPHORUS: Phosphorus: 3.7 mg/dL (ref 2.5–4.6)

## 2022-04-22 MED ORDER — METOPROLOL TARTRATE 25 MG PO TABS: 25.0000 mg | ORAL_TABLET | Freq: Four times a day (QID) | ORAL | Status: DC

## 2022-04-22 MED ORDER — POTASSIUM CHLORIDE 10 MEQ/100ML IV SOLN
10.0000 meq | Freq: Once | INTRAVENOUS | Status: AC
  Administered 2022-04-22: 10 meq via INTRAVENOUS
  Filled 2022-04-22: qty 100

## 2022-04-22 MED ORDER — ACETAMINOPHEN 325 MG PO TABS
650.0000 mg | ORAL_TABLET | Freq: Four times a day (QID) | ORAL | Status: DC | PRN
  Administered 2022-04-23 – 2022-04-25 (×7): 650 mg via ORAL
  Filled 2022-04-22 (×7): qty 2

## 2022-04-22 MED ORDER — METOPROLOL TARTRATE 5 MG/5ML IV SOLN: 5.0000 mg | Freq: Four times a day (QID) | INTRAVENOUS | Status: DC

## 2022-04-22 MED ORDER — LOPERAMIDE HCL 2 MG PO CAPS
2.0000 mg | ORAL_CAPSULE | Freq: Two times a day (BID) | ORAL | Status: DC | PRN
  Administered 2022-04-22 – 2022-04-25 (×5): 2 mg via ORAL
  Filled 2022-04-22 (×5): qty 1

## 2022-04-22 MED ORDER — SODIUM CHLORIDE 0.9 % IV SOLN: INTRAVENOUS | Status: DC | PRN

## 2022-04-22 MED ORDER — BOOST / RESOURCE BREEZE PO LIQD CUSTOM
1.0000 | Freq: Three times a day (TID) | ORAL | Status: DC
  Administered 2022-04-22 – 2022-04-25 (×7): 1 via ORAL

## 2022-04-22 MED ORDER — METOPROLOL TARTRATE 25 MG PO TABS
25.0000 mg | ORAL_TABLET | Freq: Four times a day (QID) | ORAL | Status: DC
  Administered 2022-04-22 – 2022-04-23 (×3): 25 mg via ORAL
  Filled 2022-04-22 (×3): qty 1

## 2022-04-22 MED ORDER — BANATROL TF EN LIQD
60.0000 mL | Freq: Two times a day (BID) | ENTERAL | Status: DC
  Administered 2022-04-23 – 2022-04-28 (×12): 60 mL
  Filled 2022-04-22 (×13): qty 60

## 2022-04-22 MED ORDER — METOPROLOL TARTRATE 5 MG/5ML IV SOLN
5.0000 mg | Freq: Four times a day (QID) | INTRAVENOUS | Status: DC | PRN
  Administered 2022-04-22 – 2022-04-28 (×6): 5 mg via INTRAVENOUS
  Filled 2022-04-22 (×6): qty 5

## 2022-04-22 MED ORDER — HYALURONIDASE HUMAN 150 UNIT/ML IJ SOLN
150.0000 [IU] | Freq: Once | INTRAMUSCULAR | Status: AC
  Administered 2022-04-22: 150 [IU] via SUBCUTANEOUS
  Filled 2022-04-22: qty 1

## 2022-04-22 NOTE — Progress Notes (Signed)
Mililani Murthy Hunnell             VVO:[id number]    DOB: [birth year]    DOA: 04/15/2022   PCP: Idelle Crouch, MD    I have briefly reviewed patients previous medical records in [facility].   No chief complaint on file.     Brief Narrative:  82 year old married female, PMH of HTN, HLD, hypothyroid, COPD, anemia, GERD, s/p repair of paraesophageal hernia robotically approximately 11 months ago, presented to ED on 04/15/2022 with persistent bloating, eructation and chest discomfort.  She was diagnosed with symptomatic recurrent paraesophageal hernia.  Admitted by the surgical team.  She underwent robotic assisted laparoscopic repair of paraesophageal hernia with bio mesh and Nissen fundoplication, redo on 2/69 which was complicated by esophageal perforation, right pneumothorax requiring right chest tube placement, robotic assisted repair of esophageal perforation, jejunostomy tube, mediastinal Blake drain placement.  TRH consulted 1/12 for management of medical issues.  1/14: New onset narrow complex tachycardia, SVT, transferred to stepdown.  Cardiology consulted.  Started on IV amiodarone and maintaining SR.     Assessment & Plan:  Principal Problem:   S/P repair of paraesophageal hernia Active Problems:   Esophageal perforation   SVT/Atrial tachycardia Developed 1/14 morning.  Rates up to 160s.  Did not see prior history of A-fib, a flutter or SVT.  Per patient and spouse, told to have "tachycardia" without specification.  Not on anticoagulation.  Home dose of Cardizem CD 240 mg daily and Ziac (bisoprolol 5 Mg) on hold since admission due to n.p.o.  Blood pressure being managed with metoprolol 5 Mg IV every 6 hours.  Transferred to stepdown. TSH: 6.278, free T4: 0.99, suspect hypothyroidism and recommend repeating TFTs in 4 weeks and continue current dose of Synthroid (initiated IV Synthroid 1/16). Cardiology consulted and their input appreciated.  They indicate SVT, likely atrial  tachycardia no A-fib, precipitated by her acute/critical illness.  She was started on IV amiodarone drip and maintaining sinus rhythm.  Episodes of atrial tachycardia as per cardiology, when able to take oral medications, can consider stopping IV amiodarone and switching to oral metoprolol.  No indication for anticoagulation.  TTE 1/15: LVEF 48-54% grade 1 diastolic dysfunction.   Delirium: Sometimes, in the evenings, patient appeared to get confused and anxious.  Had paradoxical response reportedly to IV Ativan .  Avoid benzodiazepines.  Delirium precautions.  Minimize opioids.  As per spouse at bedside, no recurrence of AMS    Essential hypertension: PTA on bisoprolol-HCTZ daily, Cardizem CD 240 mg daily.  Continue IV metoprolol 5 Mg every 6 hours.  As needed IV hydralazine.  Reasonable inpatient control.   Hypothyroidism: Last dose of Synthroid 75 mg daily was on 1/11 and can hold it for up to 7 days prior to considering IV if needed.  Started IV Synthroid 1/16.  This can be switched to oral when tolerating by mouth.   Hyperlipidemia: On Pravachol 80 mg daily PTA.  Mildly elevated AST and ALT, likely due to tube feeds, hold off on starting statins at this time.  Resume when able to tolerate p.o. LFT abnormalities have resolved.   Hypokalemia: Continue to replace as needed and follow. Mag normal.   Anemia: Likely multifactorial.  Stable.   S/p robotic assisted laparoscopic paraesophageal hernia repair 6/27, complicated by esophageal leak requiring immediate takeback for robotic assisted primary repair of esophageal perforation, placement of mediastinal drain/right chest tube/feeding jejunostomy tube: Management per primary service.  Will remain n.p.o. x 5 days,  enteral feeding via jejunostomy tube, no pills via jejunostomy tube, liquids only (confirmed with surgical team).  IV Zosyn.  Multimodality pain control.  Right-sided chest tube changed by IR on 1/15 but not much drainage and surgeons  are planning to reevaluate with CT chest and reposition chest tube as needed due to persisting large pleural effusion.   Mild transaminitis: ?  Related to tube feeds.  Follow LFT  Statins on hold.  Resolved.   Leukocytosis likley due to intraabdominal infection WBC up to 17 K.Improved.  Remains on IV Zosyn.       Body mass index is 26.91 kg/m.   Nutritional Status Nutrition Problem: Increased nutrient needs Etiology: post-op healing Signs/Symptoms: estimated needs Interventions: Tube feeding, Prostat   Pressure Ulcer:     DVT prophylaxis: enoxaparin (LOVENOX) injection 40 mg Start: 04/16/22 0800 SCDs Start: 04/15/22 1132     Code Status: Full Code:  Family Communication: Spouse at bedside. Disposition:  Inpatient appropriate due to ongoing IV antibiotics, IV amiodarone, n.p.o., G-tube feeds etc. Likely acute inpatient rehab at discharge       Consultants:   [facility] consultants. Cardiology   Procedures:   As above   Antimicrobials:   IV Zosyn     Subjective:  Patient NG tube was removed this morning per recommendations of general surgery, Foley catheter has also been removed.  Patient remains n.p.o. will undergo swallow evaluation today prior to initiation of oral diet.  Patient able to walk 30 feet with help today with PT.  Per PT patient may require acute inpatient rehab   Objective:          Vitals:    04/20/22 0900 04/20/22 1000 04/20/22 1100 04/20/22 1222  BP: (!) 149/89 137/87 (!) 143/79 (!) 159/93  Pulse: 86 76 78 81  Resp: '19 18 19 18  '$ Temp:       97.7 F (36.5 C)  TempSrc:       Oral  SpO2: 95% 96% 95% 99%  Weight:          Height:              General exam: Elderly female, small built and frail lying comfort bili propped up in bed.  Continuing to look better compared to the weekend. Respiratory system: Reduced breath right lung bases.  Rest of lung fields clear to auscultation.  Right-sided chest tube.    On [place] oxygen 2 L/min and saturating in the  mid 90s. Cardiovascular system: S1 & S2 heard, regular tachycardia. No JVD, murmurs, rubs, gallops or clicks. No pedal edema.  Telemetry personally reviewed: Remains in sinus rhythm without any further runs of SVT. Gastrointestinal system: Abdomen is nondistended, soft and nontender. No organomegaly or masses felt. Normal bowel sounds heard.  Gastrostomy tube site without acute findings. Central nervous system: Alert and oriented. No focal neurological deficits.  May be somewhat hard of hearing. Extremities: Symmetric 5 x 5 power. Skin: No rashes, lesions or ulcers Psychiatry: Judgement and insight appear somewhat impaired. Mood & affect appropriate.        Data Reviewed:   I have personally reviewed following labs and imaging studies     CBC: Last Labs       Recent Labs  Lab 04/17/22 0632 04/19/22 0413 04/20/22 0323  WBC 15.3* 17.4* 11.3*  HGB 9.8* 10.7* 10.1*  HCT 31.2* 33.9* 30.8*  MCV 86.7 86.0 83.0  PLT 254 323 315        Basic Metabolic Panel: Last Labs  Recent Labs  Lab 04/15/22 1644 04/17/22 0632 04/18/22 0616 04/19/22 0413 04/20/22 0323  NA  --  134* 135 136 133*  K  --  3.3* 3.6 4.0 3.4*  CL  --  103 106 103 100  CO2  --  '24 24 25 22  '$ GLUCOSE  --  131* 149* 118* 134*  BUN  --  '21 15 15 20  '$ CREATININE 0.66 0.73 0.50 0.63 0.58  CALCIUM  --  7.5* 7.4* 7.7* 7.6*  MG  --  1.8 1.9  --  1.9  PHOS  --  2.9  --   --  4.3        Liver Function Tests: Last Labs       Recent Labs  Lab 04/17/22 0632 04/18/22 0616 04/19/22 0413  AST 110* 61* 31  ALT 87* 78* 54*  ALKPHOS 63 111 122  BILITOT 0.2* 0.5 0.5  PROT 5.3* 5.5* 5.8*  ALBUMIN 2.3* 2.2* 2.2*        CBG: Last Labs       Recent Labs  Lab 04/20/22 0330 04/20/22 0805 04/20/22 1135  GLUCAP 136* 125* 116*        Microbiology Studies:           Recent Results (from the past 240 hour(s))  MRSA Next Gen by PCR, Nasal     Status: None    Collection Time: 04/18/22  9:03 AM     Specimen: Nasal Mucosa; Nasal Swab  Result Value Ref Range Status    MRSA by PCR Next Gen NOT DETECTED NOT DETECTED Final      Comment: (NOTE) The GeneXpert MRSA Assay (FDA approved for NASAL specimens only), is one component of a comprehensive MRSA colonization surveillance program. It is not intended to diagnose MRSA infection nor to guide or monitor treatment for MRSA infections. Test performance is not FDA approved in patients less than 95 years old. Performed at [facility], [address] [place], [place] [address]        Radiology Studies:   Imaging Results (Last 48 hours)  IR [facility] PLEURAL DRAIN W/INDWELL CATH W/IMG GUIDE   Result Date: 04/20/2022 INDICATION: 82 year old woman with right hydropneumothorax related to esophageal leak. Patient has a right chest tube in place which terminates anteriorly. It has resolved the pneumothorax, however fluid remains within the dependent portion of the chest. IR consulted for removal of right anterior chest tube and placement of right posterior chest tube for drainage of remaining fluid. EXAM: 1. Ultrasound-guided right chest tube placement (14 [place]) 2. Right anterior chest tube removal. MEDICATIONS: 25 mcg IV fentanyl ANESTHESIA/SEDATION: None COMPLICATIONS: None immediate. PROCEDURE: Informed written consent was obtained from the patient after a thorough discussion of the procedural risks, benefits and alternatives. All questions were addressed. Maximal Sterile Barrier Technique was utilized including caps, mask, sterile gowns, sterile gloves, sterile drape, hand hygiene and skin antiseptic. A timeout was performed prior to the initiation of the procedure. Patient positioned supine on the procedure table. Ultrasound evaluation of the right chest demonstrated a small pleural effusion. The overlying skin was prepped and draped in sterile fashion. Following local lidocaine administration, the pleural space was accessed with a 19 gauge Yueh  needle. Straw-colored fluid was seen returning from the needle hub. The Yueh needle was removed over 0.035 inch guidewire. Serial dilation was performed and 14 French pigtail catheter was inserted over the guidewire. The new drain was connected to pleura vac set at -20 cm H2O. The drain  was secured to skin with suture and covered with sterile dressing. The right anterior chest tube was then removed without difficulty. The insertion site was covered with Vaseline gauze and sterile dressing. IMPRESSION: Successful ultrasound-guided insertion of right chest tube (14 [place]). Electronically Signed   By: Miachel Roux M.D.   On: 04/20/2022 08:55    DG Chest Port 1 View   Result Date: 04/19/2022 CLINICAL DATA:  Chest tube placement EXAM: PORTABLE CHEST 1 VIEW COMPARISON:  04/18/2022 FINDINGS: Single frontal view of the chest was performed. There has been replacement of the right-sided chest tube, with new pigtail drainage catheter coiled over the right lateral mid chest. Decreased right pleural effusion after chest tube repositioning. Minimal gas at the right costophrenic angle likely due to recent intervention. Stable areas of bilateral consolidation and left pleural effusion. Enteric catheter unchanged. Cardiac silhouette remains enlarged. IMPRESSION: 1. Interval replacement of the right pigtail drainage catheter, with decreased right pleural effusion after catheter replacement. Trace amount of gas within the right pleural space likely due to recent intervention. 2. Stable bilateral consolidation and left pleural effusion. Electronically Signed   By: Randa Ngo M.D.   On: 04/19/2022 16:58    ECHOCARDIOGRAM COMPLETE   Result Date: 04/19/2022    ECHOCARDIOGRAM REPORT   Patient Name:   SUMAIYA ARRUDA [facility] Date of Exam: 04/19/2022 Medical Rec #:  [id number]       Height:       60.0 in Accession #:    [id number]      Weight:       137.8 lb Date of Birth:  03-22-1941       BSA:          1.593 m Patient Age:    24 years         BP:           136/77 mmHg Patient Gender: F               HR:           98 bpm. Exam Location:  [facility] Procedure: 2D Echo, Cardiac Doppler and Color Doppler Indications:     Atrial Flutter I48.92  History:         Patient has no prior history of Echocardiogram examinations.                  COPD; Risk Factors:Hypertension.  Sonographer:     Sherrie Sport Referring Phys:  [place] Diagnosing Phys: Kathlyn Sacramento MD  Sonographer Comments: Technically difficult study due to poor echo windows. IMPRESSIONS  1. Left ventricular ejection fraction, by estimation, is 55 to 60%. The left ventricle has normal function. Left ventricular endocardial border not optimally defined to evaluate regional wall motion. Left ventricular diastolic parameters are consistent with Grade I diastolic dysfunction (impaired relaxation).  2. Right ventricular systolic function is normal. The right ventricular size is normal. There is moderately elevated pulmonary artery systolic pressure. The estimated right ventricular systolic pressure is 22.9 mmHg.  3. Left atrial size was mildly dilated.  4. The mitral valve is normal in structure. Mild mitral valve regurgitation. No evidence of mitral stenosis. Moderate mitral annular calcification.  5. The aortic valve is normal in structure. Aortic valve regurgitation is not visualized. Aortic valve sclerosis is present, with no evidence of aortic valve stenosis. FINDINGS  Left Ventricle: Left ventricular ejection fraction, by estimation, is 55 to 60%. The left ventricle has normal function. Left ventricular endocardial border not optimally defined  to evaluate regional wall motion. The left ventricular internal cavity size was normal in size. There is no left ventricular hypertrophy. Left ventricular diastolic parameters are consistent with Grade I diastolic dysfunction (impaired relaxation). Right Ventricle: The right ventricular size is normal. No increase in right ventricular wall  thickness. Right ventricular systolic function is normal. There is moderately elevated pulmonary artery systolic pressure. The tricuspid regurgitant velocity is 3.46 m/s, and with an assumed right atrial pressure of 5 mmHg, the estimated right ventricular systolic pressure is 69.4 mmHg. Left Atrium: Left atrial size was mildly dilated. Right Atrium: Right atrial size was normal in size. Pericardium: There is no evidence of pericardial effusion. Mitral Valve: The mitral valve is normal in structure. Moderate mitral annular calcification. Mild mitral valve regurgitation. No evidence of mitral valve stenosis. Tricuspid Valve: The tricuspid valve is normal in structure. Tricuspid valve regurgitation is mild . No evidence of tricuspid stenosis. Aortic Valve: The aortic valve is normal in structure. Aortic valve regurgitation is not visualized. Aortic valve sclerosis is present, with no evidence of aortic valve stenosis. Aortic valve mean gradient measures 2.5 mmHg. Aortic valve peak gradient measures 4.8 mmHg. Aortic valve area, by VTI measures 3.79 cm. Pulmonic Valve: The pulmonic valve was normal in structure. Pulmonic valve regurgitation is not visualized. No evidence of pulmonic stenosis. Aorta: The aortic root is normal in size and structure. Venous: The inferior vena cava was not well visualized. IAS/Shunts: No atrial level shunt detected by color flow Doppler.  LEFT VENTRICLE PLAX 2D LVIDd:         4.10 cm   Diastology LVIDs:         3.00 cm   LV e' medial:    4.79 cm/s LV PW:         1.00 cm   LV E/e' medial:  11.4 LV IVS:        1.00 cm   LV e' lateral:   6.64 cm/s LVOT diam:     2.00 cm   LV E/e' lateral: 8.2 LV SV:         66 LV SV Index:   42 LVOT Area:     3.14 cm  RIGHT VENTRICLE RV Basal diam:  3.40 cm RV Mid diam:    3.10 cm RV S prime:     17.80 cm/s TAPSE (M-mode): 2.4 cm LEFT ATRIUM             Index        RIGHT ATRIUM           Index LA diam:        3.80 cm 2.39 cm/m   RA Area:     11.20 cm LA  Vol (A2C):   51.5 ml 32.32 ml/m  RA Volume:   20.60 ml  12.93 ml/m LA Vol (A4C):   41.8 ml 26.24 ml/m LA Biplane Vol: 50.5 ml 31.70 ml/m  AORTIC VALVE AV Area (Vmax):    3.03 cm AV Area (Vmean):   3.10 cm AV Area (VTI):     3.79 cm AV Vmax:           110.00 cm/s AV Vmean:          73.300 cm/s AV VTI:            0.175 m AV Peak Grad:      4.8 mmHg AV Mean Grad:      2.5 mmHg LVOT Vmax:         106.00 cm/s LVOT  Vmean:        72.400 cm/s LVOT VTI:          0.211 m LVOT/AV VTI ratio: 1.21  AORTA Ao Root diam: 2.70 cm MITRAL VALVE               TRICUSPID VALVE MV Area (PHT): 6.37 cm    TR Peak grad:   47.9 mmHg MV Decel Time: 119 msec    TR Vmax:        346.00 cm/s MV E velocity: 54.50 cm/s MV A velocity: 94.90 cm/s  SHUNTS MV E/A ratio:  0.57        Systemic VTI:  0.21 m                            Systemic Diam: 2.00 cm Kathlyn Sacramento MD Electronically signed by Kathlyn Sacramento MD Signature Date/Time: 04/19/2022/1:40:45 PM    Final     CT CHEST ABDOMEN PELVIS W CONTRAST   Result Date: 04/18/2022 CLINICAL DATA:  Paraesophageal hernia repair complicated by perforation EXAM: CT CHEST, ABDOMEN, AND PELVIS WITH CONTRAST TECHNIQUE: Multidetector CT imaging of the chest, abdomen and pelvis was performed following the standard protocol during bolus administration of intravenous contrast. RADIATION DOSE REDUCTION: This exam was performed according to the departmental dose-optimization program which includes automated exposure control, adjustment of the mA and/or kV according to patient size and/or use of iterative reconstruction technique. CONTRAST:  13m OMNIPAQUE IOHEXOL 350 MG/ML SOLN COMPARISON:  04/15/2022 FINDINGS: CT CHEST FINDINGS Cardiovascular: The heart is unremarkable without pericardial effusion. No evidence of thoracic aortic aneurysm or dissection. Atheromatous plaque within the descending thoracic aorta. Diffuse atherosclerosis of the aorta and coronary vasculature. Mediastinum/Nodes: Minimal gas  within the anterior mediastinum consistent with recent surgical intervention. Enteric catheter extends into the gastric lumen. Thyroid and trachea are unremarkable. Previous Nissen fundoplication, with small hiatal hernia again identified. Loculated fluid along the right lateral aspect of the hiatal hernia sac, measuring 4.0 x 2.3 cm, decreased since prior study. There is intraluminal gas within the hiatal hernia at site of prior Nissen fundoplication. No extraluminal gas is seen within the lower mediastinum. Lungs/Pleura: Moderate bilateral pleural effusions, right greater than left. Dependent lower lobe atelectasis. Background emphysema. Right chest tube along the right mediastinal margin. No pneumothorax. Musculoskeletal: No acute or destructive bony lesions. Subcutaneous gas within the chest wall consistent with recent surgical intervention. Reconstructed images demonstrate no additional findings. CT ABDOMEN PELVIS FINDINGS Hepatobiliary: No focal liver abnormality is seen. No gallstones, gallbladder wall thickening, or biliary dilatation. Pancreas: Unremarkable. No pancreatic ductal dilatation or surrounding inflammatory changes. Spleen: Normal in size without focal abnormality. Adrenals/Urinary Tract: Adrenal glands are unremarkable. Kidneys are normal, without renal calculi, focal lesion, or hydronephrosis. Bladder is decompressed with a Foley catheter. Stomach/Bowel: No bowel obstruction or ileus. Diffuse colonic diverticulosis without evidence of diverticulitis. Normal appendix right lower quadrant. Previous Nissen fundoplication, with small hiatal hernia again noted. Enteric catheter extends into the gastric lumen. There is a percutaneous jejunostomy tube, tip within the right mid abdomen within the mid jejunum. Vascular/Lymphatic: Aortic atherosclerosis. No enlarged abdominal or pelvic lymph nodes. Reproductive: Uterus and bilateral adnexa are unremarkable. Other: Surgical drain enters the abdomen in the  left paraumbilical region, and extends superiorly through the sub hepatic region with tip residing at the diaphragmatic hiatus. There is no free fluid or free intraperitoneal gas. No abdominal wall hernia. Musculoskeletal: No acute or destructive bony lesions. Postsurgical changes right  hip. Reconstructed images demonstrate no additional findings. IMPRESSION: 1. Postsurgical changes related to interval esophageal perforation repair. Surgical drain tip at the gastroesophageal junction at the diaphragmatic hiatus, with 4.0 x 2.3 cm residual fluid collection along the right lateral margin of the distal esophagus. No extraluminal gas to suggest persistent or recurrent perforation. 2. Moderate bilateral pleural effusions and dependent lower lobe atelectasis, right greater than left. Right chest tube in place, with no residual or recurrent pneumothorax. 3. Enteric catheter and percutaneous jejunostomy tube as above. 4. Colonic diverticulosis without diverticulitis. 5.  Aortic Atherosclerosis (ICD10-I70.0). Electronically Signed   By: Randa Ngo M.D.   On: 04/18/2022 17:45       Scheduled Meds:     Chlorhexidine Gluconate Cloth  6 each Topical Daily   Chlorhexidine Gluconate Cloth  6 each Topical Q0600   enoxaparin (LOVENOX) injection  40 mg Subcutaneous Q24H   free water  30 mL Per Tube Q4H   ketorolac  15 mg Intravenous Q6H   metoprolol tartrate  5 mg Intravenous Q6H   pantoprazole (PROTONIX) IV  40 mg Intravenous Q12H      Continuous Infusions:     acetaminophen Stopped (04/20/22 1056)   amiodarone 30 mg/hr (04/20/22 1104)   feeding supplement (OSMOLITE 1.2 CAL) 60 mL/hr at 04/20/22 1104   piperacillin-tazobactam (ZOSYN)  IV 3.375 g (04/20/22 0636)

## 2022-04-22 NOTE — Progress Notes (Addendum)
Physical Therapy Treatment Patient Details Name: Damari Hiltz MRN: [id number] DOB: 15-May-1940 Today's Date: 04/22/2022   History of Present Illness Pt is a 82 year old female  45 Days Post-Op s/p robotic assisted laparoscopic paraesophageal hernia repair complicated by esophageal leak requiring immediate take back for robotic assisted primary repair of esophageal perforation, placement of mediastinal drain, feeding jejunostomy placement, and right chest tube placements; On 1/14 1/14: New onset narrow complex tachycardia, SVT, transferred to stepdown.  Cardiology consulted.  Started on IV amiodarone; PMH significant for  HTN, HLD, hypothyroid, COPD, anemia, GERD, s/p repair of paraesophageal hernia robotically approximately 11 months ago    PT Comments    Patient is agreeable to PT session. She feels fatigued and complains of mid back pain. She continues to require assistance with bed mobility, transfers, and ambulation. Patient walked 52f with the rolling walker with activity tolerance limited by fatigue. Heart rate 95bpm after walking. Recommend to continue PT to maximize independence and facilitate return to prior level of function. Patient and spouse are still hopeful to return home. Consider acute inpatient rehab if patient unable to return home.    Recommendations for follow up therapy are one component of a multi-disciplinary discharge planning process, led by the attending physician.  Recommendations may be updated based on patient status, additional functional criteria and insurance authorization.  Follow Up Recommendations  Acute inpatient rehab (3hours/day)     Assistance Recommended at Discharge Intermittent Supervision/Assistance  Patient can return home with the following A little help with walking and/or transfers;A little help with bathing/dressing/bathroom;Assist for transportation;Help with stairs or ramp for entrance;Assistance with cooking/housework   Equipment  Recommendations  None recommended by PT    Recommendations for Other Services       Precautions / Restrictions Precautions Precautions: Fall Precaution Comments: R chest tube, JP Restrictions Weight Bearing Restrictions: No     Mobility  Bed Mobility Overal bed mobility: Needs Assistance Bed Mobility: Supine to Sit, Sit to Supine     Supine to sit: Min assist Sit to supine: Min assist   General bed mobility comments: verbal cues for logroll technique. assistance for trunk support to sit upright and assistance for LE to return to bed    Transfers Overall transfer level: Needs assistance Equipment used: Rolling walker (2 wheels) Transfers: Sit to/from Stand Sit to Stand: Min assist           General transfer comment: second person for line management. verbal cues for hand placement. lifting assistance required for standing    Ambulation/Gait Ambulation/Gait assistance: Min assist Gait Distance (Feet): 30 Feet Assistive device: Rolling walker (2 wheels) Gait Pattern/deviations: Decreased stride length       General Gait Details: slow cadence with intermittent assistance required for rolling walker negotiation. activity tolerance limited by fatigue   Stairs             Wheelchair Mobility    Modified Rankin (Stroke Patients Only)       Balance Overall balance assessment: Needs assistance Sitting-balance support: Feet supported Sitting balance-Leahy Scale: Fair     Standing balance support: Bilateral upper extremity supported Standing balance-Leahy Scale: Fair Standing balance comment: using rolling walker for support in standing                            Cognition Arousal/Alertness: Awake/alert Behavior During Therapy: WFL for tasks assessed/performed Overall Cognitive Status: Within Functional Limits for tasks assessed  Exercises      General Comments General  comments (skin integrity, edema, etc.): all lines/leads intact following session- RN pulled foley prior to session, NG removed; vitals monitored appear stable throughout. HR up to 86 with mobility, spo2 >90% on 3 L via Foard      Pertinent Vitals/Pain Pain Assessment Pain Assessment: Faces Faces Pain Scale: Hurts even more Pain Location: mid back Pain Descriptors / Indicators: Discomfort Pain Intervention(s): Monitored during session, Limited activity within patient's tolerance, Repositioned, Patient requesting pain meds-RN notified    Home Living                          Prior Function            PT Goals (current goals can now be found in the care plan section) Acute Rehab PT Goals PT Goal Formulation: With patient/family Time For Goal Achievement: 05/04/22 Potential to Achieve Goals: Good Progress towards PT goals: Progressing toward goals    Frequency    Min 2X/week      PT Plan Current plan remains appropriate    Co-evaluation PT/OT/SLP Co-Evaluation/Treatment: Yes Reason for Co-Treatment: For patient/therapist safety;To address functional/ADL transfers PT goals addressed during session: Mobility/safety with mobility OT goals addressed during session: ADL's and self-care      AM-PAC PT "6 Clicks" Mobility   Outcome Measure  Help needed turning from your back to your side while in a flat bed without using bedrails?: A Little Help needed moving from lying on your back to sitting on the side of a flat bed without using bedrails?: A Lot Help needed moving to and from a bed to a chair (including a wheelchair)?: A Lot Help needed standing up from a chair using your arms (e.g., wheelchair or bedside chair)?: A Lot Help needed to walk in hospital room?: A Lot Help needed climbing 3-5 steps with a railing? : A Lot 6 Click Score: 13    End of Session Equipment Utilized During Treatment: Oxygen Activity Tolerance: Patient tolerated treatment well Patient  left: in bed;with bed alarm set;with call bell/phone within reach Nurse Communication: Mobility status PT Visit Diagnosis: Unsteadiness on feet (R26.81);Muscle weakness (generalized) (M62.81)     Time: 5597-4163 PT Time Calculation (min) (ACUTE ONLY): 34 min  Charges:  $Therapeutic Activity: 8-22 mins                     Minna Merritts, PT, MPT   [name] 04/22/2022, 12:51 PM

## 2022-04-22 NOTE — Progress Notes (Addendum)
Nutrition Follow-up  DOCUMENTATION CODES:   Not applicable  INTERVENTION:   -Continue Boost Breeze po TID, each supplement provides 250 kcal and 9 grams of protein  -TF via j-tube:   Osmolite 1.2 @ 30 ml/hr and increase by 10 ml every 8 hours to goal rate of 60 ml/hr.    80 ml free water flush every 4 hours   Tube feeding regimen provides 1728 kcal (100% of needs), 80 grams of protein, and 1181 ml of H2O.  Total free water: 1661 ml daily  -60 ml Banatrol BID via tube  NUTRITION DIAGNOSIS:   Increased nutrient needs related to post-op healing as evidenced by estimated needs.  Ongoing  GOAL:   Patient will meet greater than or equal to 90% of their needs  Progressing  MONITOR:   Diet advancement, Labs, Weight trends, TF tolerance, I & O's, Skin  REASON FOR ASSESSMENT:   Consult Enteral/tube feeding initiation and management, Assessment of nutrition requirement/status  ASSESSMENT:   82 y/o female with h/o GERD, HTN, shingles, thyroid disease, HTN, hiatal hernia s/p Nissen fundiplocation and paraesophageal hernia repair 04/07/2021, COPD, B12 deficiency, IDA, hip fracture s/p repair 2020 and lumbar spondlyosis who is admitted with recurrent hiatal hernia s/p robotic assisted laparoscopic repair of  paraesophageal hernia with Bio-A Mesh and Nissen fundoplication, redo 9/41 complicated by esophageal perforation now s/p robotic assisted primary repair of esophageal perforation 1/11 (with chest tube placement, jejunostomy tube placement, blake drain placement, removal of BioA mesh and omental flap).  1/11- s/p rt chest tube placement, robotic assisted primary repair to esophageal perforation, robotic assisted jejunostomy tube, robotic mediastinal blake drain placement, removal of BioA mesh, and omental flap  1/15- s/p rt chest tube placement  Reviewed I/O's: +2.3 L x 24 hours and +11.1 L since admission  UOP: 525 ml x 24 hours  Drain output: 115 ml x 24 hours  Chest tube  output: 42 ml x 24 hours   Spoke with pt and husband at bedside. Pt more alert and animated today in comparison to visit yesterday morning. Pt smiling and reports that she is happy that her NGT is out. Pt denies any nausea, vomiting, and abdominal pain. Per pt and husband, pt had difficulty tolerating TF rate earlier this admission and was turned down to 10 ml/hr over the weekend. Rate has been at 30 ml/hr at least over the past 24 hours (goal rate is 60 ml/hr). Pt and husband are agreeable to slowly advance TF, given pt has had inadequate nutrition over the past week.   Case discussed with RN, who reports that UGI was performed yesterday and pt was advanced to a clear liquid diet. Boost Breeze supplements also ordered.   Case discussed with general surgery regarding plan for TF. General surgery amenable to slow advancement of TF. Pt currently on clear liquids and also reports very slow advancement of diet. Communicated findings with RN.   Per discussion with general surgery, pt continues to have diarrhea. Will add fiber supplement (banatrol) to add more fiber.   Per CIR notes, pt not medically ready for CIR, but continuing to monitor pt's progress.    Medications reviewed.   Labs reviewed: Na: 128, CBGS: 138-159 (inpatient orders for glycemic control are none).    Diet Order:   Diet Order             Diet clear liquid Fluid consistency: Thin  Diet effective now  EDUCATION NEEDS:   Education needs have been addressed  Skin:  Skin Assessment: Skin Integrity Issues: Skin Integrity Issues:: Incisions Incisions: closed abdomen, s/p j-tube placement  Last BM:  04/21/22 (type 7)  Height:   Ht Readings from Last 1 Encounters:  04/15/22 5' (1.524 m)    Weight:   Wt Readings from Last 1 Encounters:  04/20/22 62.5 kg    Ideal Body Weight:  45.5 kg  BMI:  Body mass index is 26.91 kg/m.  Estimated Nutritional Needs:   Kcal:  1650-1850  Protein:  85-100  grams  Fluid:  > 1.6 L    Loistine Chance, RD, LDN, [place] Registered Dietitian II Certified Diabetes Care and Education Specialist Please refer to [facility] for RD and/or RD on-call/weekend/after hours pager

## 2022-04-22 NOTE — Plan of Care (Signed)
  Problem: Education: Goal: Knowledge of General Education information will improve Description: Including pain rating scale, medication(s)/side effects and non-pharmacologic comfort measures Outcome: Progressing

## 2022-04-22 NOTE — Progress Notes (Signed)
Progress Note  Patient Name: Kelly Wood Date of Encounter: 04/22/2022  Primary Cardiologist: new - consult by O'Neal  Subjective   She continues to have paroxysms of atrial tachycardia into the 120s to 130s bpm. No chest pain, dyspnea, palpitations, dizziness, presyncope, or syncope.   Inpatient Medications    Scheduled Meds:  Chlorhexidine Gluconate Cloth  6 each Topical Daily   Chlorhexidine Gluconate Cloth  6 each Topical Q0600   enoxaparin (LOVENOX) injection  40 mg Subcutaneous Q24H   feeding supplement  1 Container Oral TID BM   free water  80 mL Per Tube Q4H   levothyroxine  50 mcg Intravenous Daily   metoprolol tartrate  5 mg Intravenous Q6H   pantoprazole (PROTONIX) IV  40 mg Intravenous Q12H   Continuous Infusions:  amiodarone 30 mg/hr (04/22/22 1037)   feeding supplement (OSMOLITE 1.2 CAL) 60 mL/hr at 04/22/22 0700   piperacillin-tazobactam (ZOSYN)  IV 3.375 g (04/22/22 0505)   PRN Meds: albuterol, [DISCONTINUED] diphenhydrAMINE **OR** diphenhydrAMINE, hydrALAZINE, menthol-cetylpyridinium, morphine injection, naphazoline-glycerin, oxyCODONE, phenol   Vital Signs    Vitals:   04/21/22 2059 04/21/22 2351 04/22/22 0500 04/22/22 0800  BP: 122/86 (!) 149/82 (!) 131/99 133/76  Pulse:  86 (!) 139 87  Resp: '20 20 20 16  '$ Temp: 97.6 F (36.4 C) 98.9 F (37.2 C) 98.3 F (36.8 C) (!) 97.4 F (36.3 C)  TempSrc: Oral Oral  Oral  SpO2: 98% 97% 96% 100%  Weight:      Height:        Intake/Output Summary (Last 24 hours) at 04/22/2022 1127 Last data filed at 04/22/2022 1045 Gross per 24 hour  Intake 3097.47 ml  Output 908 ml  Net 2189.47 ml   Filed Weights   04/17/22 0427 04/18/22 0500 04/20/22 0334  Weight: 62.9 kg 62.5 kg 62.5 kg    Telemetry    Paroxysms of atrial tachycardia into the 120s to 130s bpm, currently in atrial tachycardia in the 130s bpm - Personally Reviewed  ECG    No new tracings - Personally Reviewed  Physical Exam   GEN: No  acute distress.   Neck: No JVD. Cardiac: Tachycardic, no murmurs, rubs, or gallops.  Respiratory: Clear to auscultation bilaterally.  GI: Soft, nontender, non-distended.   MS: No edema; No deformity. Neuro:  Alert and oriented x 3; Nonfocal.  Psych: Normal affect.  Labs    Chemistry Recent Labs  Lab 04/17/22 [phone] 04/18/22 0616 04/19/22 0413 04/20/22 0323 04/21/22 0436 04/22/22 0558  NA 134* 135 136 133* 131* 128*  K 3.3* 3.6 4.0 3.4* 3.4* 3.5  CL 103 106 103 100 98 94*  CO2 '24 24 25 22 26 26  '$ GLUCOSE 131* 149* 118* 134* 129* 165*  BUN '21 15 15 20 16 20  '$ CREATININE 0.73 0.50 0.63 0.58 0.52 0.42*  CALCIUM 7.5* 7.4* 7.7* 7.6* 7.7* 7.4*  PROT 5.3* 5.5* 5.8*  --   --   --   ALBUMIN 2.3* 2.2* 2.2*  --   --   --   AST 110* 61* 31  --   --   --   ALT 87* 78* 54*  --   --   --   ALKPHOS 63 111 122  --   --   --   BILITOT 0.2* 0.5 0.5  --   --   --   GFRNONAA >60 >60 >60 >60 >60 >60  ANIONGAP '7 5 8 11 7 8     '$ Hematology Recent Labs  Lab 04/19/22 0413 04/20/22 0323 04/21/22 0436  WBC 17.4* 11.3* 15.4*  RBC 3.94 3.71* 3.75*  HGB 10.7* 10.1* 10.1*  HCT 33.9* 30.8* 31.4*  MCV 86.0 83.0 83.7  MCH 27.2 27.2 26.9  MCHC 31.6 32.8 32.2  RDW 14.4 14.7 14.7  PLT 323 315 330    Cardiac EnzymesNo results for input(s): "TROPONINI" in the last 168 hours. No results for input(s): "TROPIPOC" in the last 168 hours.   BNPNo results for input(s): "BNP", "PROBNP" in the last 168 hours.   DDimer No results for input(s): "DDIMER" in the last 168 hours.   Radiology    DG ESOPHAGUS W SINGLE CM (SOL OR THIN BA)  Result Date: 04/21/2022 IMPRESSION: 1. No extraluminal contrast to suggest esophageal perforation. Electronically Signed   By: Kathreen Devoid M.D.   On: 04/21/2022 14:50   CT CHEST WO CONTRAST  Result Date: 04/20/2022 IMPRESSION: 1. Interval replacement of the right chest tube, with new pigtail drainage catheter traversing the right pleural effusion. No change in right pleural  effusion volume. Minimal gas in the right pleural space consistent with recent intervention. 2. Stable small left pleural effusion. 3. Emphysema, with stable dependent lower lobe atelectasis. 4. Postsurgical changes at the gastroesophageal junction, with stable enteric catheter and hiatal hernia. Evaluation of the surgical site is limited without intravenous contrast. 5. Aortic Atherosclerosis (ICD10-I70.0). Coronary artery atherosclerosis. Electronically Signed   By: Randa Ngo M.D.   On: 04/20/2022 16:11    Cardiac Studies   2D echo 04/19/2022: 1. Left ventricular ejection fraction, by estimation, is 55 to 60%. The  left ventricle has normal function. Left ventricular endocardial border  not optimally defined to evaluate regional wall motion. Left ventricular  diastolic parameters are consistent  with Grade I diastolic dysfunction (impaired relaxation).   2. Right ventricular systolic function is normal. The right ventricular  size is normal. There is moderately elevated pulmonary artery systolic  pressure. The estimated right ventricular systolic pressure is 00.9 mmHg.   3. Left atrial size was mildly dilated.   4. The mitral valve is normal in structure. Mild mitral valve  regurgitation. No evidence of mitral stenosis. Moderate mitral annular  calcification.   5. The aortic valve is normal in structure. Aortic valve regurgitation is  not visualized. Aortic valve sclerosis is present, with no evidence of  aortic valve stenosis.   Patient Profile     82 y.o. female with history of HTN, COPD, B12 deficiency, paraesophageal hernia s/p robotic assisted laparoscopic paraesophageal hernia repair complicated by esophageal leak requiring immediate take back for robotic assisted primary repair of esophageal perforation, placement of mediastinal drain, feeding jejunostomy placement, and right chest tube placements  and who we are seeing for SVT.  Assessment & Plan    1. Atrial  tachycardia: -She continues to have frequent paroxysms of atrial tachycardia in the 120s to 130s bpm -No pills per surgery -Start IV Lopressor 5 mg q 6 hours (change from prn) -Continue IV amiodarone for rate control -Start oral medication when able -Hemodynamically stable, no indication for emergent DCCV at this time -TSH mildly elevated with normal free T4  2. Hypokalemia: -Replete to goal 4.0 via IV -Magnesium at goal     For questions or updates, please contact [place] Please consult [email or web address] for contact info under Cardiology/STEMI.    Signed, Christell Faith, PA-C [place] Pager: [phone] 04/22/2022, 11:27 AM

## 2022-04-22 NOTE — Progress Notes (Signed)
[place] Hospital Day(s): 6.   Post op day(s): 7 Days Post-Op.   Interval History:  Patient seen and examined No issues overnight; did not sleep well HR this morning up to 139 at 0500 but much improved when I saw her; down to 87 Patient anxious this morning No significant abdominal pain; no CP/SOB No fever, chills, nausea, emesis Leukocytosis up this morning; 15.4K Hgb stable at 10.1 Renal function normal; sCr - 0.42; UO - 525 ccs No electrolyte derangements this AM NGT in place; output minimal; 100 ccs Drain present; mediastinal; 115 ccs; output more serosanguinous Chest tube present; right; ~40 ccs in pleurovac; serous; no air leak NPO this morning  Continues on Zosyn  UGI (01/17) without leak  Vital signs in last 24 hours: [min-max] current  Temp:  [97.6 F (36.4 C)-98.9 F (37.2 C)] 98.3 F (36.8 C) (01/18 0500) Pulse Rate:  [66-139] 139 (01/18 0500) Resp:  [16-20] 20 (01/18 0500) BP: (121-160)/(70-99) 131/99 (01/18 0500) SpO2:  [96 %-99 %] 96 % (01/18 0500)     Height: 5' (152.4 cm) Weight: 62.5 kg BMI (Calculated): 26.91   Intake/Output last 2 shifts:  01/17 0701 - 01/18 0700 In: 2870.7 [I.V.:397.7; NG/GT:2369; IV Piggyback:104] Out: 783 [Urine:525; Emesis/NG output:100; Drains:115; Stool:1; Chest Tube:42]   Physical Exam:  Constitutional: alert, cooperative and no distress  HEENT: NGT in place (REMOVED) Chest: Right sided chest tube; site CDI; minimal serous output; no air leak Respiratory: breathing non-labored at rest; on  Cardiovascular: regular rate and sinus rhythm  Gastrointestinal: soft, non-tender, and non-distended, no rebound/guarding. Mediastinal drain; minimal serosanguinous output Genitourinary: Foley in place; good UO Integumentary: Laparoscopic incisions are CDI; no erythema  Labs:     Latest Ref Rng & Units 04/21/2022    4:36 AM 04/20/2022    3:23 AM 04/19/2022    4:13 AM  CBC  WBC 4.0 - 10.5  K/uL 15.4  11.3  17.4   Hemoglobin 12.0 - 15.0 g/dL 10.1  10.1  10.7   Hematocrit 36.0 - 46.0 % 31.4  30.8  33.9   Platelets 150 - 400 K/uL 330  315  323       Latest Ref Rng & Units 04/22/2022    5:58 AM 04/21/2022    4:36 AM 04/20/2022    3:23 AM  CMP  Glucose 70 - 99 mg/dL 165  129  134   BUN 8 - 23 mg/dL '20  16  20   '$ Creatinine 0.44 - 1.00 mg/dL 0.42  0.52  0.58   Sodium 135 - 145 mmol/L 128  131  133   Potassium 3.5 - 5.1 mmol/L 3.5  3.4  3.4   Chloride 98 - 111 mmol/L 94  98  100   CO2 22 - 32 mmol/L '26  26  22   '$ Calcium 8.9 - 10.3 mg/dL 7.4  7.7  7.6     Imaging studies: No new pertinent imaging studies   Assessment/Plan:  82 y.o. female 7 Days Post-Op s/p robotic assisted laparoscopic paraesophageal hernia repair complicated by esophageal leak requiring immediate take back for robotic assisted primary repair of esophageal perforation, placement of mediastinal drain, feeding jejunostomy placement, and right chest tube placements.    - Will remove NGT this morning  - Okay to trial small sips of CLD; added Boost breeze. Encouraged her to take this slow   - Continue enteric feeding via jejunostomy for now; liquids only; NO PILLS via jejunostomy  - Continue chest  tube; monitor and record output - Discontinue foley today; patient preference  - Continue Blake drain in mediastinum; monitor and record output  - Continue IV Abx (Zosyn)   - Monitor abdominal examination - Pain control prn; antiemetics prn - Can mobilize with therapies; current recommendations are inpatient rehab - Appreciate medicine/PCCM for assistance  All of the above findings and recommendations were discussed with the patient, patient's family, and the medical team, and all of patient's and family's questions were answered to their expressed satisfaction.  -- Edison Simon, PA-C [facility] 04/22/2022, 7:29 AM M-F: 7am - 4pm

## 2022-04-22 NOTE — Progress Notes (Signed)
Kelly Wood             DQQ:[id number]    DOB: 04/03/1941    DOA: 04/15/2022   PCP: Idelle Crouch, MD    I have briefly reviewed patients previous medical records in [facility].   No chief complaint on file.     Brief Narrative:  82 year old married female, PMH of HTN, HLD, hypothyroid, COPD, anemia, GERD, s/p repair of paraesophageal hernia robotically approximately 11 months ago, presented to ED on 04/15/2022 with persistent bloating, eructation and chest discomfort.  She was diagnosed with symptomatic recurrent paraesophageal hernia.  Admitted by the surgical team.  She underwent robotic assisted laparoscopic repair of paraesophageal hernia with bio mesh and Nissen fundoplication, redo on 1/94 which was complicated by esophageal perforation, right pneumothorax requiring right chest tube placement, robotic assisted repair of esophageal perforation, jejunostomy tube, mediastinal Blake drain placement.  TRH consulted 1/12 for management of medical issues.  1/14: New onset narrow complex tachycardia, SVT, transferred to stepdown.  Cardiology consulted.  Started on IV amiodarone and maintaining SR.     Assessment & Plan:  Principal Problem:   S/P repair of paraesophageal hernia Active Problems:   Esophageal perforation   SVT Developed 1/14 morning.  Rates up to 160s.  Did not see prior history of A-fib, a flutter or SVT.  Per patient and spouse, told to have "tachycardia" without specification.  Not on anticoagulation.  Home dose of Cardizem CD 240 mg daily and Ziac (bisoprolol 5 Mg) on hold since admission due to n.p.o.  Blood pressure being managed with metoprolol 5 Mg IV every 6 hours.  Transferred to stepdown. TSH: 6.278, free T4: 0.99, suspect sick euthyroid and recommend repeating TFTs in 4 weeks and continue current dose of Synthroid (initiated IV Synthroid 1/16). Cardiology consulted and their input appreciated.  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  She was started on IV amiodarone drip and maintaining sinus rhythm.  As per cardiology, when able to take oral medications, can consider stopping IV amiodarone and switching to oral metoprolol.  No indication for anticoagulation.  TTE 1/15: LVEF 17-40% grade 1 diastolic dysfunction.   Delirium: Over the weekend, in the evenings, patient appeared to get confused and anxious.  Had paradoxical response reportedly to IV Ativan yesterday.  Avoid benzodiazepines.  Delirium precautions.  Minimize opioids.  As per spouse at bedside, no recurrence of AMS overnight.   Essential hypertension: PTA on bisoprolol-HCTZ daily, Cardizem CD 240 mg daily.  Continue IV metoprolol 5 Mg every 6 hours.  As needed IV hydralazine.  Reasonable inpatient control.   Hypothyroidism: Last dose of Synthroid 75 mg daily was on 1/11 and can hold it for up to 7 days prior to considering IV if needed.  Started IV Synthroid 1/16.  This can be switched to oral when tolerating by mouth.   Hyperlipidemia: On Pravachol 80 mg daily PTA.  Mildly elevated AST and ALT, likely due to tube feeds, hold off on starting statins at this time.  Resume when able to tolerate p.o. LFT abnormalities have resolved.   Hypokalemia: Continue to replace as needed and follow.  Magnesium 1.9.   Anemia: Likely multifactorial.  Stable.   S/p robotic assisted laparoscopic paraesophageal hernia repair 8/14, complicated by esophageal leak requiring immediate takeback for robotic assisted primary repair of esophageal perforation, placement of mediastinal drain/right chest tube/feeding jejunostomy tube: Management per primary service.  Will remain n.p.o. x 5 days, enteral feeding  via jejunostomy tube, no pills via jejunostomy tube, liquids only (confirmed with surgical team).  IV Zosyn.  Multimodality pain control.  Right-sided chest tube changed by IR on 1/15 but not much drainage and surgeons are planning to reevaluate with CT  chest and reposition chest tube as needed due to persisting large pleural effusion.   Mild transaminitis: ?  Related to tube feeds.  Follow CMP daily.  Statins on hold.  Resolved.   Leukocytosis: WBC up to 17 K.  Remains on IV Zosyn.  Improved.     Body mass index is 26.91 kg/m.   Nutritional Status Nutrition Problem: Increased nutrient needs Etiology: post-op healing Signs/Symptoms: estimated needs Interventions: Tube feeding, Prostat   Pressure Ulcer:     DVT prophylaxis: enoxaparin (LOVENOX) injection 40 mg Start: 04/16/22 0800 SCDs Start: 04/15/22 1132     Code Status: Full Code:  Family Communication: Spouse at bedside. Disposition:  Inpatient appropriate due to ongoing IV antibiotics, IV amiodarone, n.p.o., G-tube feeds etc.       Consultants:   [facility]. Cardiology   Procedures:   As above   Antimicrobials:   IV Zosyn     Subjective:  Patient did better overnight.  No confusion or agitation.  Slept well.  No specific complaints reported.  No chest pain or dyspnea.  Patient had a soft BM this morning.   Objective:          Vitals:    04/20/22 0900 04/20/22 1000 04/20/22 1100 04/20/22 1222  BP: (!) 149/89 137/87 (!) 143/79 (!) 159/93  Pulse: 86 76 78 81  Resp: '19 18 19 18  '$ Temp:       97.7 F (36.5 C)  TempSrc:       Oral  SpO2: 95% 96% 95% 99%  Weight:          Height:              General exam: Elderly female, small built and frail lying comfort bili propped up in bed.  Continuing to look better compared to the weekend. Respiratory system: Reduced breath right lung bases.  Rest of lung fields clear to auscultation.  Right-sided chest tube.  NG tube present.  On [place] oxygen 2 L/min and saturating in the mid 90s. Cardiovascular system: S1 & S2 heard, regular tachycardia. No JVD, murmurs, rubs, gallops or clicks. No pedal edema.  Telemetry personally reviewed: Remains in sinus rhythm without any further runs of SVT. Gastrointestinal system:  Abdomen is nondistended, soft and nontender. No organomegaly or masses felt. Normal bowel sounds heard.  Gastrostomy tube site without acute findings. Central nervous system: Alert and oriented. No focal neurological deficits.  May be somewhat hard of hearing. Extremities: Symmetric 5 x 5 power. Skin: No rashes, lesions or ulcers Psychiatry: Judgement and insight appear somewhat impaired. Mood & affect appropriate.        Data Reviewed:   I have personally reviewed following labs and imaging studies     CBC: Last Labs       Recent Labs  Lab 04/17/22 0632 04/19/22 0413 04/20/22 0323  WBC 15.3* 17.4* 11.3*  HGB 9.8* 10.7* 10.1*  HCT 31.2* 33.9* 30.8*  MCV 86.7 86.0 83.0  PLT 254 323 315        Basic Metabolic Panel: Last Labs         Recent Labs  Lab 04/15/22 1644 04/17/22 0632 04/18/22 0616 04/19/22 0413 04/20/22 0323  NA  --  134* 135 136 133*  K  --  3.3* 3.6 4.0 3.4*  CL  --  103 106 103 100  CO2  --  '24 24 25 22  '$ GLUCOSE  --  131* 149* 118* 134*  BUN  --  '21 15 15 20  '$ CREATININE 0.66 0.73 0.50 0.63 0.58  CALCIUM  --  7.5* 7.4* 7.7* 7.6*  MG  --  1.8 1.9  --  1.9  PHOS  --  2.9  --   --  4.3        Liver Function Tests: Last Labs       Recent Labs  Lab 04/17/22 0632 04/18/22 0616 04/19/22 0413  AST 110* 61* 31  ALT 87* 78* 54*  ALKPHOS 63 111 122  BILITOT 0.2* 0.5 0.5  PROT 5.3* 5.5* 5.8*  ALBUMIN 2.3* 2.2* 2.2*        CBG: Last Labs       Recent Labs  Lab 04/20/22 0330 04/20/22 0805 04/20/22 1135  GLUCAP 136* 125* 116*        Microbiology Studies:           Recent Results (from the past 240 hour(s))  MRSA Next Gen by PCR, Nasal     Status: None    Collection Time: 04/18/22  9:03 AM    Specimen: Nasal Mucosa; Nasal Swab  Result Value Ref Range Status    MRSA by PCR Next Gen NOT DETECTED NOT DETECTED Final      Comment: (NOTE) The GeneXpert MRSA Assay (FDA approved for NASAL specimens only), is one component of a  comprehensive MRSA colonization surveillance program. It is not intended to diagnose MRSA infection nor to guide or monitor treatment for MRSA infections. Test performance is not FDA approved in patients less than 71 years old. Performed at [facility], [address], [place], [place] [address]        Radiology Studies:   Imaging Results (Last 48 hours)  IR [facility] PLEURAL DRAIN W/INDWELL CATH W/IMG GUIDE   Result Date: 04/20/2022 INDICATION: 82 year old woman with right hydropneumothorax related to esophageal leak. Patient has a right chest tube in place which terminates anteriorly. It has resolved the pneumothorax, however fluid remains within the dependent portion of the chest. IR consulted for removal of right anterior chest tube and placement of right posterior chest tube for drainage of remaining fluid. EXAM: 1. Ultrasound-guided right chest tube placement (14 [place]) 2. Right anterior chest tube removal. MEDICATIONS: 25 mcg IV fentanyl ANESTHESIA/SEDATION: None COMPLICATIONS: None immediate. PROCEDURE: Informed written consent was obtained from the patient after a thorough discussion of the procedural risks, benefits and alternatives. All questions were addressed. Maximal Sterile Barrier Technique was utilized including caps, mask, sterile gowns, sterile gloves, sterile drape, hand hygiene and skin antiseptic. A timeout was performed prior to the initiation of the procedure. Patient positioned supine on the procedure table. Ultrasound evaluation of the right chest demonstrated a small pleural effusion. The overlying skin was prepped and draped in sterile fashion. Following local lidocaine administration, the pleural space was accessed with a 19 gauge Yueh needle. Straw-colored fluid was seen returning from the needle hub. The Yueh needle was removed over 0.035 inch guidewire. Serial dilation was performed and 14 French pigtail catheter was inserted over the guidewire. The new drain was  connected to pleura vac set at -20 cm H2O. The drain was secured to skin with suture and covered with sterile dressing. The right anterior chest tube was then removed without difficulty. The insertion site was covered with Vaseline  gauze and sterile dressing. IMPRESSION: Successful ultrasound-guided insertion of right chest tube (14 [place]). Electronically Signed   By: Miachel Roux M.D.   On: 04/20/2022 08:55    DG Chest Port 1 View   Result Date: 04/19/2022 CLINICAL DATA:  Chest tube placement EXAM: PORTABLE CHEST 1 VIEW COMPARISON:  04/18/2022 FINDINGS: Single frontal view of the chest was performed. There has been replacement of the right-sided chest tube, with new pigtail drainage catheter coiled over the right lateral mid chest. Decreased right pleural effusion after chest tube repositioning. Minimal gas at the right costophrenic angle likely due to recent intervention. Stable areas of bilateral consolidation and left pleural effusion. Enteric catheter unchanged. Cardiac silhouette remains enlarged. IMPRESSION: 1. Interval replacement of the right pigtail drainage catheter, with decreased right pleural effusion after catheter replacement. Trace amount of gas within the right pleural space likely due to recent intervention. 2. Stable bilateral consolidation and left pleural effusion. Electronically Signed   By: Randa Ngo M.D.   On: 04/19/2022 16:58    ECHOCARDIOGRAM COMPLETE   Result Date: 04/19/2022    ECHOCARDIOGRAM REPORT   Patient Name:   Kelly Wood [facility] Date of Exam: 04/19/2022 Medical Rec #:  [id number]       Height:       60.0 in Accession #:    [id number]      Weight:       137.8 lb Date of Birth:  01/30/1941       BSA:          1.593 m Patient Age:    22 years        BP:           136/77 mmHg Patient Gender: F               HR:           98 bpm. Exam Location:  [facility] Procedure: 2D Echo, Cardiac Doppler and Color Doppler Indications:     Atrial Flutter I48.92  History:         Patient has no  prior history of Echocardiogram examinations.                  COPD; Risk Factors:Hypertension.  Sonographer:     Sherrie Sport Referring Phys:  [place] Diagnosing Phys: Kathlyn Sacramento MD  Sonographer Comments: Technically difficult study due to poor echo windows. IMPRESSIONS  1. Left ventricular ejection fraction, by estimation, is 55 to 60%. The left ventricle has normal function. Left ventricular endocardial border not optimally defined to evaluate regional wall motion. Left ventricular diastolic parameters are consistent with Grade I diastolic dysfunction (impaired relaxation).  2. Right ventricular systolic function is normal. The right ventricular size is normal. There is moderately elevated pulmonary artery systolic pressure. The estimated right ventricular systolic pressure is 51.7 mmHg.  3. Left atrial size was mildly dilated.  4. The mitral valve is normal in structure. Mild mitral valve regurgitation. No evidence of mitral stenosis. Moderate mitral annular calcification.  5. The aortic valve is normal in structure. Aortic valve regurgitation is not visualized. Aortic valve sclerosis is present, with no evidence of aortic valve stenosis. FINDINGS  Left Ventricle: Left ventricular ejection fraction, by estimation, is 55 to 60%. The left ventricle has normal function. Left ventricular endocardial border not optimally defined to evaluate regional wall motion. The left ventricular internal cavity size was normal in size. There is no left ventricular hypertrophy. Left ventricular diastolic parameters are consistent with  Grade I diastolic dysfunction (impaired relaxation). Right Ventricle: The right ventricular size is normal. No increase in right ventricular wall thickness. Right ventricular systolic function is normal. There is moderately elevated pulmonary artery systolic pressure. The tricuspid regurgitant velocity is 3.46 m/s, and with an assumed right atrial pressure of 5 mmHg, the estimated  right ventricular systolic pressure is 25.9 mmHg. Left Atrium: Left atrial size was mildly dilated. Right Atrium: Right atrial size was normal in size. Pericardium: There is no evidence of pericardial effusion. Mitral Valve: The mitral valve is normal in structure. Moderate mitral annular calcification. Mild mitral valve regurgitation. No evidence of mitral valve stenosis. Tricuspid Valve: The tricuspid valve is normal in structure. Tricuspid valve regurgitation is mild . No evidence of tricuspid stenosis. Aortic Valve: The aortic valve is normal in structure. Aortic valve regurgitation is not visualized. Aortic valve sclerosis is present, with no evidence of aortic valve stenosis. Aortic valve mean gradient measures 2.5 mmHg. Aortic valve peak gradient measures 4.8 mmHg. Aortic valve area, by VTI measures 3.79 cm. Pulmonic Valve: The pulmonic valve was normal in structure. Pulmonic valve regurgitation is not visualized. No evidence of pulmonic stenosis. Aorta: The aortic root is normal in size and structure. Venous: The inferior vena cava was not well visualized. IAS/Shunts: No atrial level shunt detected by color flow Doppler.  LEFT VENTRICLE PLAX 2D LVIDd:         4.10 cm   Diastology LVIDs:         3.00 cm   LV e' medial:    4.79 cm/s LV PW:         1.00 cm   LV E/e' medial:  11.4 LV IVS:        1.00 cm   LV e' lateral:   6.64 cm/s LVOT diam:     2.00 cm   LV E/e' lateral: 8.2 LV SV:         66 LV SV Index:   42 LVOT Area:     3.14 cm  RIGHT VENTRICLE RV Basal diam:  3.40 cm RV Mid diam:    3.10 cm RV S prime:     17.80 cm/s TAPSE (M-mode): 2.4 cm LEFT ATRIUM             Index        RIGHT ATRIUM           Index LA diam:        3.80 cm 2.39 cm/m   RA Area:     11.20 cm LA Vol (A2C):   51.5 ml 32.32 ml/m  RA Volume:   20.60 ml  12.93 ml/m LA Vol (A4C):   41.8 ml 26.24 ml/m LA Biplane Vol: 50.5 ml 31.70 ml/m  AORTIC VALVE AV Area (Vmax):    3.03 cm AV Area (Vmean):   3.10 cm AV Area (VTI):     3.79 cm  AV Vmax:           110.00 cm/s AV Vmean:          73.300 cm/s AV VTI:            0.175 m AV Peak Grad:      4.8 mmHg AV Mean Grad:      2.5 mmHg LVOT Vmax:         106.00 cm/s LVOT Vmean:        72.400 cm/s LVOT VTI:          0.211 m LVOT/AV VTI ratio: 1.21  AORTA Ao Root diam: 2.70 cm MITRAL VALVE               TRICUSPID VALVE MV Area (PHT): 6.37 cm    TR Peak grad:   47.9 mmHg MV Decel Time: 119 msec    TR Vmax:        346.00 cm/s MV E velocity: 54.50 cm/s MV A velocity: 94.90 cm/s  SHUNTS MV E/A ratio:  0.57        Systemic VTI:  0.21 m                            Systemic Diam: 2.00 cm Kathlyn Sacramento MD Electronically signed by Kathlyn Sacramento MD Signature Date/Time: 04/19/2022/1:40:45 PM    Final     CT CHEST ABDOMEN PELVIS W CONTRAST   Result Date: 04/18/2022 CLINICAL DATA:  Paraesophageal hernia repair complicated by perforation EXAM: CT CHEST, ABDOMEN, AND PELVIS WITH CONTRAST TECHNIQUE: Multidetector CT imaging of the chest, abdomen and pelvis was performed following the standard protocol during bolus administration of intravenous contrast. RADIATION DOSE REDUCTION: This exam was performed according to the departmental dose-optimization program which includes automated exposure control, adjustment of the mA and/or kV according to patient size and/or use of iterative reconstruction technique. CONTRAST:  150m OMNIPAQUE IOHEXOL 350 MG/ML SOLN COMPARISON:  04/15/2022 FINDINGS: CT CHEST FINDINGS Cardiovascular: The heart is unremarkable without pericardial effusion. No evidence of thoracic aortic aneurysm or dissection. Atheromatous plaque within the descending thoracic aorta. Diffuse atherosclerosis of the aorta and coronary vasculature. Mediastinum/Nodes: Minimal gas within the anterior mediastinum consistent with recent surgical intervention. Enteric catheter extends into the gastric lumen. Thyroid and trachea are unremarkable. Previous Nissen fundoplication, with small hiatal hernia again identified.  Loculated fluid along the right lateral aspect of the hiatal hernia sac, measuring 4.0 x 2.3 cm, decreased since prior study. There is intraluminal gas within the hiatal hernia at site of prior Nissen fundoplication. No extraluminal gas is seen within the lower mediastinum. Lungs/Pleura: Moderate bilateral pleural effusions, right greater than left. Dependent lower lobe atelectasis. Background emphysema. Right chest tube along the right mediastinal margin. No pneumothorax. Musculoskeletal: No acute or destructive bony lesions. Subcutaneous gas within the chest wall consistent with recent surgical intervention. Reconstructed images demonstrate no additional findings. CT ABDOMEN PELVIS FINDINGS Hepatobiliary: No focal liver abnormality is seen. No gallstones, gallbladder wall thickening, or biliary dilatation. Pancreas: Unremarkable. No pancreatic ductal dilatation or surrounding inflammatory changes. Spleen: Normal in size without focal abnormality. Adrenals/Urinary Tract: Adrenal glands are unremarkable. Kidneys are normal, without renal calculi, focal lesion, or hydronephrosis. Bladder is decompressed with a Foley catheter. Stomach/Bowel: No bowel obstruction or ileus. Diffuse colonic diverticulosis without evidence of diverticulitis. Normal appendix right lower quadrant. Previous Nissen fundoplication, with small hiatal hernia again noted. Enteric catheter extends into the gastric lumen. There is a percutaneous jejunostomy tube, tip within the right mid abdomen within the mid jejunum. Vascular/Lymphatic: Aortic atherosclerosis. No enlarged abdominal or pelvic lymph nodes. Reproductive: Uterus and bilateral adnexa are unremarkable. Other: Surgical drain enters the abdomen in the left paraumbilical region, and extends superiorly through the sub hepatic region with tip residing at the diaphragmatic hiatus. There is no free fluid or free intraperitoneal gas. No abdominal wall hernia. Musculoskeletal: No acute or  destructive bony lesions. Postsurgical changes right hip. Reconstructed images demonstrate no additional findings. IMPRESSION: 1. Postsurgical changes related to interval esophageal perforation repair. Surgical drain tip at the gastroesophageal junction at the diaphragmatic hiatus,  with 4.0 x 2.3 cm residual fluid collection along the right lateral margin of the distal esophagus. No extraluminal gas to suggest persistent or recurrent perforation. 2. Moderate bilateral pleural effusions and dependent lower lobe atelectasis, right greater than left. Right chest tube in place, with no residual or recurrent pneumothorax. 3. Enteric catheter and percutaneous jejunostomy tube as above. 4. Colonic diverticulosis without diverticulitis. 5.  Aortic Atherosclerosis (ICD10-I70.0). Electronically Signed   By: Randa Ngo M.D.   On: 04/18/2022 17:45       Scheduled Meds:     Chlorhexidine Gluconate Cloth  6 each Topical Daily   Chlorhexidine Gluconate Cloth  6 each Topical Q0600   enoxaparin (LOVENOX) injection  40 mg Subcutaneous Q24H   free water  30 mL Per Tube Q4H   ketorolac  15 mg Intravenous Q6H   metoprolol tartrate  5 mg Intravenous Q6H   pantoprazole (PROTONIX) IV  40 mg Intravenous Q12H      Continuous Infusions:     acetaminophen Stopped (04/20/22 1056)   amiodarone 30 mg/hr (04/20/22 1104)   feeding supplement (OSMOLITE 1.2 CAL) 60 mL/hr at 04/20/22 1104   piperacillin-tazobactam (ZOSYN)  IV 3.375 g (04/20/22 0636)

## 2022-04-22 NOTE — Progress Notes (Signed)
Occupational Therapy Treatment Patient Details Name: Kelly Wood MRN: [id number] DOB: 07-29-1940 Today's Date: 04/22/2022   History of present illness Pt is a 82 year old female  98 Days Post-Op s/p robotic assisted laparoscopic paraesophageal hernia repair complicated by esophageal leak requiring immediate take back for robotic assisted primary repair of esophageal perforation, placement of mediastinal drain, feeding jejunostomy placement, and right chest tube placements; On 1/14 1/14: New onset narrow complex tachycardia, SVT, transferred to stepdown.  Cardiology consulted.  Started on IV amiodarone; PMH significant for  HTN, HLD, hypothyroid, COPD, anemia, GERD, s/p repair of paraesophageal hernia robotically approximately 11 months ago   OT comments  Chart reviewed ,nurse cleared pt for participation in OT tx session. Co tx completed with PT on this date. Tx session targeted improving functional activity tolerance for improved ADL participation. Improvements noted  as evidenced by grooming tasks complete din standing at sink level with CGA, amb in room with RW 30' with MIN A with RW, +2 for lines/leads. Pt is making progress towards goals, discharge recommendation remains appropriate. OT will continue to follow acutely.    Recommendations for follow up therapy are one component of a multi-disciplinary discharge planning process, led by the attending physician.  Recommendations may be updated based on patient status, additional functional criteria and insurance authorization.    Follow Up Recommendations  Acute inpatient rehab (3hours/day)     Assistance Recommended at Discharge Intermittent Supervision/Assistance  Patient can return home with the following  Assistance with cooking/housework;Assist for transportation;Help with stairs or ramp for entrance;A lot of help with bathing/dressing/bathroom;A little help with walking and/or transfers   Equipment Recommendations  BSC/3in1     Recommendations for Other Services      Precautions / Restrictions Precautions Precautions: Fall Precaution Comments: R chest tube, JP drain, JG       Mobility Bed Mobility Overal bed mobility: Needs Assistance Bed Mobility: Rolling, Sidelying to Sit, Sit to Sidelying Rolling: Min assist Sidelying to sit: Min assist   Sit to supine: Min assist   General bed mobility comments: management of BLEs    Transfers Overall transfer level: Needs assistance Equipment used: Rolling walker (2 wheels) Transfers: Sit to/from Stand Sit to Stand: Min assist, +2 safety/equipment                 Balance Overall balance assessment: Needs assistance Sitting-balance support: Feet supported Sitting balance-Leahy Scale: Good     Standing balance support: Bilateral upper extremity supported, Reliant on assistive device for balance, During functional activity Standing balance-Leahy Scale: Fair                             ADL either performed or assessed with clinical judgement   ADL Overall ADL's : Needs assistance/impaired     Grooming: Set up;Standing Grooming Details (indicate cue type and reason): sink level         Upper Body Dressing : Set up;Sitting   Lower Body Dressing: Maximal assistance   Toilet Transfer: Ambulation;Rolling walker (2 wheels);Minimal assistance Toilet Transfer Details (indicate cue type and reason): simulated Toileting- Clothing Manipulation and Hygiene: Maximal assistance;Sit to/from stand       Functional mobility during ADLs: Rolling walker (2 wheels);Minimal assistance;Cueing for sequencing (30' in room) General ADL Comments: +2 for lines/leads    Extremity/Trunk Assessment              Vision       Perception  Praxis      Cognition Arousal/Alertness: Awake/alert Behavior During Therapy: WFL for tasks assessed/performed Overall Cognitive Status: Within Functional Limits for tasks assessed                                           Exercises      Shoulder Instructions       General Comments all lines/leads intact following session- RN pulled foley prior to session, NG removed; vitals monitored appear stable throughout. HR up to 86 with mobility, spo2 >90% on 3 L via [place]    Pertinent Vitals/ Pain       Pain Assessment Pain Assessment: Faces Faces Pain Scale: Hurts even more Pain Location: upper back, betweeen shoulder blades Pain Descriptors / Indicators: Discomfort, Guarding Pain Intervention(s): Monitored during session, Repositioned, Patient requesting pain meds-RN notified, Limited activity within patient's tolerance  Home Living                                          Prior Functioning/Environment              Frequency  Min 3X/week        Progress Toward Goals  OT Goals(current goals can now be found in the care plan section)  Progress towards OT goals: Progressing toward goals     Plan Discharge plan remains appropriate    Co-evaluation    PT/OT/SLP Co-Evaluation/Treatment: Yes Reason for Co-Treatment: For patient/therapist safety;To address functional/ADL transfers PT goals addressed during session: Mobility/safety with mobility OT goals addressed during session: ADL's and self-care      AM-PAC OT "6 Clicks" Daily Activity     Outcome Measure   Help from another person eating meals?: A Little Help from another person taking care of personal grooming?: None Help from another person toileting, which includes using toliet, bedpan, or urinal?: A Lot Help from another person bathing (including washing, rinsing, drying)?: A Lot Help from another person to put on and taking off regular upper body clothing?: A Little Help from another person to put on and taking off regular lower body clothing?: A Lot 6 Click Score: 16    End of Session Equipment Utilized During Treatment: Oxygen  OT Visit Diagnosis: Muscle weakness  (generalized) (M62.81);Pain;Unsteadiness on feet (R26.81)   Activity Tolerance Patient tolerated treatment well   Patient Left in bed;with call bell/phone within reach;with bed alarm set;with family/visitor present   Nurse Communication          Time: 8182-9937 OT Time Calculation (min): 35 min  Charges: OT General Charges $OT Visit: 1 Visit OT Treatments $Self Care/Home Management : 8-22 mins Shanon Payor, OTD OTR/L  04/22/22, 12:49 PM

## 2022-04-23 ENCOUNTER — Inpatient Hospital Stay: Payer: Medicare HMO

## 2022-04-23 DIAGNOSIS — K449 Diaphragmatic hernia without obstruction or gangrene: Secondary | ICD-10-CM | POA: Diagnosis not present

## 2022-04-23 DIAGNOSIS — I4719 Other supraventricular tachycardia: Secondary | ICD-10-CM

## 2022-04-23 DIAGNOSIS — Z9889 Other specified postprocedural states: Secondary | ICD-10-CM | POA: Diagnosis not present

## 2022-04-23 DIAGNOSIS — Z8719 Personal history of other diseases of the digestive system: Secondary | ICD-10-CM | POA: Diagnosis not present

## 2022-04-23 LAB — GLUCOSE, CAPILLARY
Glucose-Capillary: 139 mg/dL — ABNORMAL HIGH (ref 70–99)
Glucose-Capillary: 147 mg/dL — ABNORMAL HIGH (ref 70–99)
Glucose-Capillary: 141 mg/dL — ABNORMAL HIGH (ref 70–99)
Glucose-Capillary: 147 mg/dL — ABNORMAL HIGH (ref 70–99)
Glucose-Capillary: 147 mg/dL — ABNORMAL HIGH (ref 70–99)

## 2022-04-23 LAB — CBC
HCT: 31.6 % — ABNORMAL LOW (ref 36.0–46.0)
Hemoglobin: 10.3 g/dL — ABNORMAL LOW (ref 12.0–15.0)
MCH: 27 pg (ref 26.0–34.0)
MCHC: 32.6 g/dL (ref 30.0–36.0)
MCV: 82.9 fL (ref 80.0–100.0)
Platelets: 416 10*3/uL — ABNORMAL HIGH (ref 150–400)
RBC: 3.81 MIL/uL — ABNORMAL LOW (ref 3.87–5.11)
RDW: 15.1 % (ref 11.5–15.5)
WBC: 18.5 10*3/uL — ABNORMAL HIGH (ref 4.0–10.5)
nRBC: 0 % (ref 0.0–0.2)

## 2022-04-23 LAB — BASIC METABOLIC PANEL
Anion gap: 9 (ref 5–15)
BUN: 19 mg/dL (ref 8–23)
CO2: 26 mmol/L (ref 22–32)
Calcium: 7.5 mg/dL — ABNORMAL LOW (ref 8.9–10.3)
Chloride: 95 mmol/L — ABNORMAL LOW (ref 98–111)
Creatinine, Ser: 0.58 mg/dL (ref 0.44–1.00)
GFR, Estimated: >60 mL/min (ref >60)
Glucose, Bld: 150 mg/dL — ABNORMAL HIGH (ref 70–99)
Potassium: 3.6 mmol/L (ref 3.5–5.1)
Sodium: 130 mmol/L — ABNORMAL LOW (ref 135–145)

## 2022-04-23 MED ORDER — LEVOTHYROXINE SODIUM 50 MCG PO TABS
75.0000 ug | ORAL_TABLET | Freq: Every day | ORAL | Status: DC
  Administered 2022-04-24 – 2022-04-25 (×2): 75 ug via ORAL
  Filled 2022-04-23 (×2): qty 1

## 2022-04-23 MED ORDER — IOHEXOL 300 MG/ML  SOLN: 80.0000 mL | Freq: Once | INTRAMUSCULAR | Status: DC | PRN

## 2022-04-23 MED ORDER — IOHEXOL 300 MG/ML  SOLN
100.0000 mL | Freq: Once | INTRAMUSCULAR | Status: AC | PRN
  Administered 2022-04-23: 100 mL via INTRAVENOUS

## 2022-04-23 MED ORDER — PANTOPRAZOLE SODIUM 40 MG PO TBEC
40.0000 mg | DELAYED_RELEASE_TABLET | Freq: Two times a day (BID) | ORAL | Status: DC
  Administered 2022-04-23 – 2022-04-25 (×5): 40 mg via ORAL
  Filled 2022-04-23 (×5): qty 1

## 2022-04-23 MED ORDER — POTASSIUM CHLORIDE 10 MEQ/100ML IV SOLN
10.0000 meq | Freq: Once | INTRAVENOUS | Status: AC
  Administered 2022-04-23: 10 meq via INTRAVENOUS
  Filled 2022-04-23: qty 100

## 2022-04-23 MED ORDER — IOHEXOL 300 MG/ML  SOLN
50.0000 mL | Freq: Once | INTRAMUSCULAR | Status: AC | PRN
  Administered 2022-04-23: 50 mL via ORAL

## 2022-04-23 MED ORDER — ONDANSETRON HCL 4 MG/2ML IJ SOLN
4.0000 mg | Freq: Four times a day (QID) | INTRAMUSCULAR | Status: DC | PRN
  Administered 2022-04-23 – 2022-04-26 (×6): 4 mg via INTRAVENOUS
  Filled 2022-04-23 (×7): qty 2

## 2022-04-23 MED ORDER — METOPROLOL TARTRATE 50 MG PO TABS
50.0000 mg | ORAL_TABLET | Freq: Two times a day (BID) | ORAL | Status: DC
  Administered 2022-04-23 – 2022-04-25 (×5): 50 mg via ORAL
  Filled 2022-04-23 (×5): qty 1

## 2022-04-23 NOTE — Progress Notes (Addendum)
Progress Note    Kelly Wood  EGB:[id number] DOB: January 26, 1941  DOA: 04/15/2022 PCP: Idelle Crouch, MD      Brief Narrative:    Medical records reviewed and are as summarized below:  Kelly Wood is a 82 y.o. female with PMH of HTN, HLD, hypothyroid, COPD, anemia, GERD, s/p repair of paraesophageal hernia robotically approximately 11 months ago, presented to ED on 04/15/2022 with persistent bloating, eructation and chest discomfort.  She was diagnosed with symptomatic recurrent paraesophageal hernia.  Admitted by the surgical team.  She underwent robotic assisted laparoscopic repair of paraesophageal hernia with bio mesh and Nissen fundoplication, redo on 3/71 which was complicated by esophageal perforation, right pneumothorax requiring right chest tube placement, robotic assisted repair of esophageal perforation, jejunostomy tube, mediastinal Blake drain placement.  TRH consulted 1/12 for management of medical issues.  1/14: New onset narrow complex tachycardia, SVT, transferred to stepdown.  Cardiology consulted.  Started on IV amiodarone and maintaining SR.        Assessment/Plan:   Principal Problem:   S/P repair of paraesophageal hernia Active Problems:   Esophageal perforation   SVT (supraventricular tachycardia)   Atrial tachycardia   Hiatal hernia   Nutrition Problem: Increased nutrient needs Etiology: post-op healing  Signs/Symptoms: estimated needs   Body mass index is 26.91 kg/m.   SVT/paroxysmal atrial tachycardia: Heart rate has improved.  She is off of amiodarone drip.  Continue metoprolol.  Appreciate assistance from cardiologist. 2D echo showed EF estimated at 55 to 06%, grade 1 diastolic dysfunction, moderately elevated pulmonary artery systolic pressure  Delirium, intermittent confusion: Avoid benzodiazepines because patient reportedly had a paradoxical response to IV Ativan.  Minimize opioids and sedating medications.   S/p robotic  assisted laparoscopic paraesophageal hernia repair 2/69, complicated by esophageal leak requiring immediate takeback for robotic assisted primary repair of esophageal perforation, placement of mediastinal drain/right chest tube/feeding jejunostomy tube, leukocytosis: She is on IV Zosyn.  She is on enteral nutrition via jejunostomy tube. CT chest abdomen pelvis on 04/23/2022 did not show any evidence of contrast leak to suggest persistent or recurrent esophageal perforation.   Hypokalemia: Improved  Hyponatremia: Monitor sodium level   Other comorbidities include hyperlipidemia, hypothyroidism, hypertension, chronic anemia   Diet Order             Diet clear liquid Fluid consistency: Thin  Diet effective now                            Consultants: Cardiologist General surgeon  Procedures:  Right chest tube placement Robotic assisted primary repair of esophageal perforation Robotic assisted jejunostomy tube Robotic mediastinal blake drain placement Removal of BioA mesh Omental flap as tissue buttressing        Medications:    Chlorhexidine Gluconate Cloth  6 each Topical Daily   Chlorhexidine Gluconate Cloth  6 each Topical Q0600   enoxaparin (LOVENOX) injection  40 mg Subcutaneous Q24H   feeding supplement  1 Container Oral TID BM   fiber supplement (BANATROL TF)  60 mL Per Tube BID   free water  80 mL Per Tube Q4H   [START ON 04/24/2022] levothyroxine  75 mcg Oral QAC breakfast   metoprolol tartrate  50 mg Oral BID   pantoprazole  40 mg Oral BID   Continuous Infusions:  sodium chloride Stopped (04/22/22 2207)   feeding supplement (OSMOLITE 1.2 CAL) 50 mL/hr at 04/23/22 1500   piperacillin-tazobactam (ZOSYN)  IV 3.375 g (04/23/22 1351)     Anti-infectives (From admission, onward)    Start     Dose/Rate Route Frequency Ordered Stop   04/15/22 1730  piperacillin-tazobactam (ZOSYN) IVPB 3.375 g  Status:  Discontinued        3.375 g 100 mL/hr over 30  Minutes Intravenous Every 8 hours 04/15/22 1716 04/15/22 1719   04/15/22 1730  piperacillin-tazobactam (ZOSYN) IVPB 3.375 g        3.375 g 12.5 mL/hr over 240 Minutes Intravenous Every 8 hours 04/15/22 1719 04/29/22 2359   04/15/22 1730  piperacillin-tazobactam (ZOSYN) 3.375 GM/50ML IVPB       Note to Pharmacy: Rutherford Nail E: cabinet override      04/15/22 1730 04/16/22 0544   04/15/22 1445  ceFAZolin (ANCEF) IVPB 2g/100 mL premix  Status:  Discontinued        2 g 200 mL/hr over 30 Minutes Intravenous Every 8 hours 04/15/22 1435 04/15/22 1719   04/15/22 0645  ceFAZolin (ANCEF) IVPB 2g/100 mL premix        2 g 200 mL/hr over 30 Minutes Intravenous On call to O.R. 04/15/22 7262 04/15/22 0806              Family Communication/Anticipated D/C date and plan/Code Status   DVT prophylaxis: enoxaparin (LOVENOX) injection 40 mg Start: 04/16/22 0800 SCDs Start: 04/15/22 1132     Code Status: Full Code  Family Communication: Husband at the bedside Disposition Plan: To be determined by general surgeon       Subjective:   Interval events noted.  She complains of fatigue after working with PT today.  No shortness of breath or chest pain.  No abdominal pain or vomiting.  Her husband was at the bedside.  Objective:    Vitals:   04/22/22 2328 04/23/22 0358 04/23/22 0803 04/23/22 1225  BP: 129/64 133/70 (!) 140/85 134/72  Pulse: 73 76 72 67  Resp: '18 18  16  '$ Temp: 98.6 F (37 C) 98.2 F (36.8 C) 98.2 F (36.8 C) 98.4 F (36.9 C)  TempSrc:   Oral Oral  SpO2: 98% 97% 98% 99%  Weight:      Height:       No data found.   Intake/Output Summary (Last 24 hours) at 04/23/2022 1538 Last data filed at 04/23/2022 1500 Gross per 24 hour  Intake 2222.58 ml  Output 170 ml  Net 2052.58 ml   Filed Weights   04/17/22 0427 04/18/22 0500 04/20/22 0334  Weight: 62.9 kg 62.5 kg 62.5 kg    Exam:  GEN: NAD SKIN: Warm and dry EYES: No pallor or icterus ENT: MMM CV:  RRR PULM: CTA B ABD: soft, ND, NT, +BS, midline abdominal wound with dry and intact dressing, JP drain connected to midline abdominal wound, +jejunostomy tube CNS: AAO x 3, non focal EXT: No edema or tenderness        Data Reviewed:   I have personally reviewed following labs and imaging studies:  Labs: Labs show the following:   Basic Metabolic Panel: Recent Labs  Lab 04/17/22 0632 04/18/22 0616 04/19/22 0413 04/20/22 0323 04/21/22 0436 04/22/22 0558 04/23/22 0526  NA 134* 135 136 133* 131* 128* 130*  K 3.3* 3.6 4.0 3.4* 3.4* 3.5 3.6  CL 103 106 103 100 98 94* 95*  CO2 '24 24 25 22 26 26 26  '$ GLUCOSE 131* 149* 118* 134* 129* 165* 150*  BUN '21 15 15 20 16 20 19  '$ CREATININE 0.73 0.50 0.63  0.58 0.52 0.42* 0.58  CALCIUM 7.5* 7.4* 7.7* 7.6* 7.7* 7.4* 7.5*  MG 1.8 1.9  --  1.9 2.0 2.0  --   PHOS 2.9  --   --  4.3 3.9 3.7  --    GFR Estimated Creatinine Clearance: 47.1 mL/min (by C-G formula based on SCr of 0.58 mg/dL). Liver Function Tests: Recent Labs  Lab 04/17/22 7829 04/18/22 0616 04/19/22 0413  AST 110* 61* 31  ALT 87* 78* 54*  ALKPHOS 63 111 122  BILITOT 0.2* 0.5 0.5  PROT 5.3* 5.5* 5.8*  ALBUMIN 2.3* 2.2* 2.2*   No results for input(s): "LIPASE", "AMYLASE" in the last 168 hours. No results for input(s): "AMMONIA" in the last 168 hours. Coagulation profile No results for input(s): "INR", "PROTIME" in the last 168 hours.  CBC: Recent Labs  Lab 04/17/22 0632 04/19/22 0413 04/20/22 0323 04/21/22 0436 04/23/22 0526  WBC 15.3* 17.4* 11.3* 15.4* 18.5*  HGB 9.8* 10.7* 10.1* 10.1* 10.3*  HCT 31.2* 33.9* 30.8* 31.4* 31.6*  MCV 86.7 86.0 83.0 83.7 82.9  PLT 254 323 315 330 416*   Cardiac Enzymes: No results for input(s): "CKTOTAL", "CKMB", "CKMBINDEX", "TROPONINI" in the last 168 hours. BNP (last 3 results) No results for input(s): "PROBNP" in the last 8760 hours. CBG: Recent Labs  Lab 04/22/22 1953 04/22/22 2332 04/23/22 0356 04/23/22 0802  04/23/22 1226  GLUCAP 145* 165* 139* 147* 141*   D-Dimer: No results for input(s): "DDIMER" in the last 72 hours. Hgb A1c: No results for input(s): "HGBA1C" in the last 72 hours. Lipid Profile: No results for input(s): "CHOL", "HDL", "LDLCALC", "TRIG", "CHOLHDL", "LDLDIRECT" in the last 72 hours. Thyroid function studies: No results for input(s): "TSH", "T4TOTAL", "T3FREE", "THYROIDAB" in the last 72 hours.  Invalid input(s): "FREET3" Anemia work up: No results for input(s): "VITAMINB12", "FOLATE", "FERRITIN", "TIBC", "IRON", "RETICCTPCT" in the last 72 hours. Sepsis Labs: Recent Labs  Lab 04/19/22 0413 04/20/22 0323 04/21/22 0436 04/23/22 0526  WBC 17.4* 11.3* 15.4* 18.5*    Microbiology Recent Results (from the past 240 hour(s))  MRSA Next Gen by PCR, Nasal     Status: None   Collection Time: 04/18/22  9:03 AM   Specimen: Nasal Mucosa; Nasal Swab  Result Value Ref Range Status   MRSA by PCR Next Gen NOT DETECTED NOT DETECTED Final    Comment: (NOTE) The GeneXpert MRSA Assay (FDA approved for NASAL specimens only), is one component of a comprehensive MRSA colonization surveillance program. It is not intended to diagnose MRSA infection nor to guide or monitor treatment for MRSA infections. Test performance is not FDA approved in patients less than 20 years old. Performed at [facility], [address] [place], [place] [address]     Procedures and diagnostic studies:  CT CHEST ABDOMEN PELVIS W CONTRAST  Result Date: 04/23/2022 CLINICAL DATA:  Esophageal perforation EXAM: CT CHEST, ABDOMEN, AND PELVIS WITH CONTRAST TECHNIQUE: Multidetector CT imaging of the chest, abdomen and pelvis was performed following the standard protocol during bolus administration of intravenous contrast. RADIATION DOSE REDUCTION: This exam was performed according to the departmental dose-optimization program which includes automated exposure control, adjustment of the mA and/or kV  according to patient size and/or use of iterative reconstruction technique. CONTRAST:  156m OMNIPAQUE IOHEXOL 300 MG/ML SOLN, 590mOMNIPAQUE IOHEXOL 300 MG/ML SOLN COMPARISON:  Esophagram 04/21/2022, chest CT 04/20/2022, CT 04/18/2022. FINDINGS: CT CHEST FINDINGS Cardiovascular: Unchanged cardiomegaly.No pericardial disease.Coronary artery atherosclerosis.Normal size main and branch pulmonary arteries. No central PE. Moderate atherosclerosis  of the thoracic aorta. There is noncalcified atherosclerotic plaque projecting towards the descending thoracic aortic lumen (series 2, image 26). Mediastinum/Nodes: No lymphadenopathy.Atrophic thyroid.Intraluminal contrast in the esophagus which remains intraluminal without evidence of contrast leak.There is severe distal esophageal wall thickening/submucosal edema. There is an extraluminal rim enhancing collection adjacent to the right aspect of the distal esophagus measuring 4.3 x 2.3 x 4.9 cm, previously measuring 4.4 x 2.3 x 4.9 cm, remeasured for consistency (series 2, image 38). Lungs/Pleura: Moderate centrilobular and paraseptal emphysema. There are small to moderate-sized bilateral pleural effusions, right greater than left with adjacent lower lung atelectasis. There is mildly thickened and enhancing pleura on the right. Effusions are similar in size to prior. There is a right basilar pigtail chest tube in place in the pleural space with punctate foci of gas within the right-sided effusion. No other pleural gas identified. Musculoskeletal: Unchanged mild anterior T10 compression deformity. No evidence of acute fracture. No aggressive osseous lesion. Multiple vertebral body hemangiomas. CT ABDOMEN PELVIS FINDINGS Hepatobiliary: No focal liver abnormality is seen. No gallstones, gallbladder wall thickening, or biliary dilatation. Pancreas: Unremarkable. No pancreatic ductal dilatation or surrounding inflammatory changes. Spleen: Normal in size without focal abnormality.  Adrenals/Urinary Tract: Adrenal glands are unremarkable. No hydronephrosis or nephroureterolithiasis. The bladder is moderately distended with intraluminal gas, likely related to recent catheterization. Foley catheter has Wood removed in the interim. Stomach/Bowel: Postsurgical changes of hiatal hernia repair. No evidence of contrast leak at the gastroesophageal junction. No evidence of bowel obstruction. There is a percutaneous jejunostomy tube in place. There is oral contrast material in the colon from recent esophagram. Extensive colonic diverticulosis primarily affecting the sigmoid, without evidence of acute diverticulitis. Generalized mild mesenteric edema. Vascular/Lymphatic: Aortoiliac atherosclerosis. No AAA. No lymphadenopathy. Reproductive: Unremarkable. Other: Percutaneous surgical drain courses to the pelvis thin cephalad with tip in the right anterior mid abdomen. The intra-abdominal portion of this drain has migrated towards the pelvis in comparison to prior CT, on which the tip previously resided at the diaphragmatic hiatus. Small fat containing inguinal hernias, left greater than right, also with trace ascites in the left inguinal hernia. Trace perihepatic ascites. Musculoskeletal: No acute osseous abnormality. No suspicious osseous lesion. Degenerative changes of the spine and hips. Prior right proximal femur fixation. IMPRESSION: Postsurgical changes of hiatal hernia repair. Persistent extraluminal collection adjacent to the right aspect of the distal esophagus measuring 4.3 x 2.3 x 4.9 cm, unchanged from prior exam. No evidence of contrast leak to suggest persistent or recurrent perforation. Unchanged small to moderate-sized bilateral pleural effusions, right greater than left, with adjacent lower lung atelectasis. Right basilar chest tube in place with punctate foci of gas within the right-sided effusion. Caudal migration of the percutaneous surgical drain in the abdomen, tip now within the  right anterior mid abdomen, which was previously at the diaphragmatic hiatus on prior CT. Percutaneous jejunostomy tube in place. Trace perihepatic ascites. Distended urinary bladder with intraluminal glass presumably related to recent catheterization, which has Wood removed in the interval. Electronically Signed   By: Maurine Simmering M.D.   On: 04/23/2022 09:06   DG Chest Port 1 View  Result Date: 04/23/2022 CLINICAL DATA:  Chest tube in place EXAM: PORTABLE CHEST 1 VIEW COMPARISON:  CT chest 04/20/2022 FINDINGS: Bilateral emphysematous changes. Right-sided chest tube. Right pleural effusion. Small amount of loculated fluid in the minor fissure. Small left pleural effusion. Mild bilateral interstitial thickening. No pneumothorax. Stable cardiomediastinal silhouette. No acute osseous abnormality. IMPRESSION: 1. Right-sided chest tube with a  small right pleural effusion. No pneumothorax. Electronically Signed   By: Kathreen Devoid M.D.   On: 04/23/2022 08:25               LOS: 7 days   Drequan Ironside  [facility]   Pager on [email or web address]. If 7PM-7AM, please contact night-coverage at [email or web address]     04/23/2022, 3:38 PM

## 2022-04-23 NOTE — TOC Progression Note (Signed)
Transition of Care [facility]) - Progression Note    Patient Details  Name: Charity Tessier MRN: [id number] Date of Birth: 01/13/1941  Transition of Care [facility]) CM/SW [place], [place] Phone Number: 04/23/2022, 9:59 AM  Clinical Narrative:     CSW notes CIR following, will consider patient once more medically stable.       Expected Discharge Plan: [place] Barriers to Discharge: Continued Medical Work up  Expected Discharge Plan and Services   Discharge Planning Services: CM Consult Post Acute Care Choice: [place] arrangements for the past 2 months: Single Family Home                           HH Arranged: RN, PT, OT [facility] Agency: [place] ([facility]) Date HH Agency Contacted: 04/20/22 Time [place]: 1104 Representative spoke with at [place]: [place] ([place]) Interventions SDOH Screenings   Food Insecurity: No Food Insecurity (04/15/2022)  Housing: Low Risk  (04/15/2022)  Transportation Needs: No Transportation Needs (04/15/2022)  Utilities: Not At Risk (04/15/2022)  Tobacco Use: Medium Risk (04/20/2022)    Readmission Risk Interventions     No data to display

## 2022-04-23 NOTE — Consult Note (Signed)
Physical Medicine and Rehabilitation Consult Reason for Consult: Debility Referring Physician: [name], MD   HPI: Kelly Wood is a 82 y.o. female 5 days post-op from robotic assisted laparoscopic paraesophageal hernia repair complicated by esophageal leak requiring immediate take back for robotic assisted primary repair of esophageal perforation, placement of mediastinal drain, feeding jejunostomy placement, and right chest tube placements. On 1/14 she was found to have new onset narrow complex tachycardia, SVT, and was transferred to step down and started on IV amiodarone. Physical Medicine & Rehabilitation was consulted to assess candidacy for CIR.     ROS +fatigue Past Medical History:  Diagnosis Date   Anemia    Aortic atherosclerosis ([place])    B12 deficiency    Cancer ([place])    basal cell   Cataract cortical, senile    COPD (chronic obstructive pulmonary disease) (HCC)    Dyspnea    Emphysema of lung (HCC)    GERD (gastroesophageal reflux disease)    History of hiatal hernia    Hyperlipidemia    Hypertension    Hypothyroidism    Osteoarthritis of left hip    Osteoporosis    Shingles    Past Surgical History:  Procedure Laterality Date   CATARACT EXTRACTION Right    CHEST TUBE INSERTION Right 04/15/2022   Procedure: CHEST TUBE INSERTION;  Surgeon: Jules Husbands, MD;  Location: [facility];  Service: General;  Laterality: Right;   COLONOSCOPY N/A 06/23/2016   Procedure: COLONOSCOPY;  Surgeon: Manya Silvas, MD;  Location: [place];  Service: Endoscopy;  Laterality: N/A;   COLONOSCOPY  2007   ESOPHAGOGASTRODUODENOSCOPY  03/03/2021   HARDWARE REMOVAL Right 11/06/2020   Procedure: EXCHANGE OF LAG SCREW, RIGHT HIP;  Surgeon: Corky Mull, MD;  Location: [facility];  Service: Orthopedics;  Laterality: Right;   INSERTION OF MESH  04/07/2021   Procedure: INSERTION OF MESH;  Surgeon: Jules Husbands, MD;  Location: [facility];  Service: General;;    INTRAMEDULLARY (IM) NAIL INTERTROCHANTERIC Right 12/26/2018   Procedure: INTRAMEDULLARY (IM) NAIL INTERTROCHANTRIC;  Surgeon: Corky Mull, MD;  Location: [facility];  Service: Orthopedics;  Laterality: Right;   IR PERC PLEURAL DRAIN W/INDWELL CATH W/IMG GUIDE  04/19/2022   LAPAROSCOPIC NISSEN FUNDOPLICATION  46/96/2952   Procedure: LAPAROSCOPIC NISSEN FUNDOPLICATION;  Surgeon: Jules Husbands, MD;  Location: [facility];  Service: General;;   TUBAL LIGATION  1970   XI ROBOTIC ASSISTED PARAESOPHAGEAL HERNIA REPAIR N/A 04/07/2021   Procedure: XI ROBOTIC ASSISTED PARAESOPHAGEAL HERNIA REPAIR with RNFA to assist;  Surgeon: Jules Husbands, MD;  Location: [facility];  Service: General;  Laterality: N/A;   XI ROBOTIC ASSISTED PARAESOPHAGEAL HERNIA REPAIR N/A 04/15/2022   Procedure: XI ROBOTIC ASSISTED PARAESOPHAGEAL HERNIA REPAIR, RNFA to assist;  Surgeon: Jules Husbands, MD;  Location: [facility];  Service: General;  Laterality: N/A;   XI ROBOTIC ASSISTED PARAESOPHAGEAL HERNIA REPAIR N/A 04/15/2022   Procedure: XI ROBOTIC ASSISTED PARAESOPHAGEAL HERNIA REPAIR;  Surgeon: Jules Husbands, MD;  Location: [facility];  Service: General;  Laterality: N/A;   Family History  Problem Relation Age of Onset   Breast cancer Neg Hx    Social History:  reports that she quit smoking about 15 years ago. Her smoking use included cigarettes. She has never used smokeless tobacco. She reports current alcohol use of about 1.0 standard drink of alcohol per week. She reports that she does not use drugs. Allergies:  Allergies  Allergen Reactions  Ativan [Lorazepam] Anxiety    Paranoia    Azithromycin Diarrhea   Levofloxacin Diarrhea    Pt prefers not to take because gi side effects   Sulfa Antibiotics Rash   Medications Prior to Admission  Medication Sig Dispense Refill   albuterol (VENTOLIN HFA) 108 (90 Base) MCG/ACT inhaler Inhale 2 puffs into the lungs every 6 (six) hours as needed for wheezing or shortness of breath.      alendronate (FOSAMAX) 70 MG tablet Take 70 mg by mouth once a week. Mondays     aspirin EC 81 MG tablet Take 81 mg by mouth daily.     bisoprolol-hydrochlorothiazide (ZIAC) 5-6.25 MG tablet Take 1 tablet by mouth daily.     diltiazem (CARDIZEM CD) 240 MG 24 hr capsule Take 240 mg by mouth daily with supper.     ibuprofen (ADVIL) 200 MG tablet Take 400 mg by mouth every 6 (six) hours as needed for mild pain.     levothyroxine (SYNTHROID) 75 MCG tablet Take 75 mcg by mouth daily before breakfast. Take on an empty stomach with a glass of water at least 30-60 minutes before breakfast     Melatonin 5 MG CAPS Take 5 mg by mouth daily as needed (sleep).     Multiple Vitamin (MULTIVITAMIN WITH MINERALS) TABS tablet Take 1 tablet by mouth daily.     pantoprazole (PROTONIX) 40 MG tablet Take 40 mg by mouth 2 (two) times daily.     pravastatin (PRAVACHOL) 80 MG tablet Take 80 mg by mouth daily.     Probiotic Product (PROBIOTIC DAILY PO) Take 1 capsule by mouth daily.     tetrahydrozoline 0.05 % ophthalmic solution Place 1 drop into both eyes 2 (two) times daily as needed (irritated eyes (VISINE)).     Turmeric 500 MG TABS Take 500 mg by mouth daily.     fluocinonide gel (LIDEX) 7.12 % Apply 1 application topically daily as needed (eczema).     furosemide (LASIX) 40 MG tablet Take 40 mg by mouth daily as needed for edema.     metoCLOPramide (REGLAN) 5 MG tablet Take 1 tablet (5 mg total) by mouth every 8 (eight) hours as needed for nausea. 30 tablet 0   ondansetron (ZOFRAN) 4 MG tablet Take 1 tablet (4 mg total) by mouth every 8 (eight) hours as needed for nausea or vomiting. 20 tablet 0    Home: Home Living Family/patient expects to be discharged to:: Private residence Living Arrangements: Spouse/significant other Available Help at Discharge: Family, Available PRN/intermittently Type of Home: House Home Access: Stairs to enter Technical brewer of Steps: 2 Entrance Stairs-Rails: Right Home  Layout: Two level, Able to live on main level with bedroom/bathroom Alternate Level Stairs-Number of Steps: no essential needs on upper level of home Bathroom Shower/Tub: Chiropodist: Standard Bathroom Accessibility: Yes Home Equipment: Conservation officer, nature (2 wheels), Rollator (4 wheels), Cane - single point, BSC/3in1, Shower seat, Tub bench  Functional History: Prior Function Prior Level of Function : Independent/Modified Independent, Driving Mobility Comments: amb with no AD ADLs Comments: indep in ADL/IADL Functional Status:  Mobility: Bed Mobility Overal bed mobility: Needs Assistance Bed Mobility: Sit to Sidelying Rolling: Min assist Sidelying to sit: Min assist Supine to sit: Min assist Sit to supine: Min assist Sit to sidelying: Min assist, HOB elevated General bed mobility comments: Min A for BLE and trunk control Transfers Overall transfer level: Needs assistance Equipment used: Rolling walker (2 wheels) Transfers: Sit to/from Stand Sit to Stand:  Min guard Bed to/from chair/wheelchair/BSC transfer type:: Step pivot Step pivot transfers: Min assist, +2 physical assistance General transfer comment: Min verbal cues for sequencing Ambulation/Gait Ambulation/Gait assistance: Min guard Gait Distance (Feet): 25 Feet Assistive device: Rolling walker (2 wheels) Gait Pattern/deviations: Trunk flexed, Step-through pattern, Decreased step length - right, Decreased step length - left General Gait Details: Slow cadence but generally steady without LOB Gait velocity: decreased    ADL: ADL Overall ADL's : Needs assistance/impaired Eating/Feeding: NPO Eating/Feeding Details (indicate cue type and reason): can chew gum/hard candies Grooming: Set up, Standing Grooming Details (indicate cue type and reason): sink level Upper Body Dressing : Set up, Sitting Upper Body Dressing Details (indicate cue type and reason): anticipate Lower Body Dressing: Maximal  assistance Lower Body Dressing Details (indicate cue type and reason): anticipate Toilet Transfer: Ambulation, Rolling walker (2 wheels), Minimal assistance Toilet Transfer Details (indicate cue type and reason): simulated Toileting- Clothing Manipulation and Hygiene: Maximal assistance, Sit to/from stand Toileting - Clothing Manipulation Details (indicate cue type and reason): anticipate Functional mobility during ADLs: Rolling walker (2 wheels), Minimal assistance, Cueing for sequencing (30' in room) General ADL Comments: +2 for lines/leads  Cognition: Cognition Overall Cognitive Status: Within Functional Limits for tasks assessed Orientation Level: Oriented X4 Cognition Arousal/Alertness: Awake/alert Behavior During Therapy: WFL for tasks assessed/performed Overall Cognitive Status: Within Functional Limits for tasks assessed Area of Impairment: Problem solving Problem Solving: Slow processing, Requires tactile cues, Requires verbal cues General Comments: patient with intermittent slow processing  Blood pressure 134/72, pulse 67, temperature 98.4 F (36.9 C), temperature source Oral, resp. rate 16, height 5' (1.524 m), weight 62.5 kg, SpO2 99 %. Physical Exam Gen: no distress, ill appearing HEENT: oral mucosa pink and moist, NCAT Cardio: Reg rate Chest: normal effort, normal rate of breathing Abd: soft, non-distended Ext: no edema Psych: pleasant, normal affect Skin: intact Neuro: no focal deficits  Results for orders placed or performed during the hospital encounter of 04/15/22 (from the past 24 hour(s))  Glucose, capillary     Status: Abnormal   Collection Time: 04/22/22  5:04 PM  Result Value Ref Range   Glucose-Capillary 119 (H) 70 - 99 mg/dL  Glucose, capillary     Status: Abnormal   Collection Time: 04/22/22  7:53 PM  Result Value Ref Range   Glucose-Capillary 145 (H) 70 - 99 mg/dL  Glucose, capillary     Status: Abnormal   Collection Time: 04/22/22 11:32 PM   Result Value Ref Range   Glucose-Capillary 165 (H) 70 - 99 mg/dL  Glucose, capillary     Status: Abnormal   Collection Time: 04/23/22  3:56 AM  Result Value Ref Range   Glucose-Capillary 139 (H) 70 - 99 mg/dL  CBC     Status: Abnormal   Collection Time: 04/23/22  5:26 AM  Result Value Ref Range   WBC 18.5 (H) 4.0 - 10.5 K/uL   RBC 3.81 (L) 3.87 - 5.11 MIL/uL   Hemoglobin 10.3 (L) 12.0 - 15.0 g/dL   HCT 31.6 (L) 36.0 - 46.0 %   MCV 82.9 80.0 - 100.0 fL   MCH 27.0 26.0 - 34.0 pg   MCHC 32.6 30.0 - 36.0 g/dL   RDW 15.1 11.5 - 15.5 %   Platelets 416 (H) 150 - 400 K/uL   nRBC 0.0 0.0 - 0.2 %  Basic metabolic panel     Status: Abnormal   Collection Time: 04/23/22  5:26 AM  Result Value Ref Range   Sodium 130 (L)  135 - 145 mmol/L   Potassium 3.6 3.5 - 5.1 mmol/L   Chloride 95 (L) 98 - 111 mmol/L   CO2 26 22 - 32 mmol/L   Glucose, Bld 150 (H) 70 - 99 mg/dL   BUN 19 8 - 23 mg/dL   Creatinine, Ser 0.58 0.44 - 1.00 mg/dL   Calcium 7.5 (L) 8.9 - 10.3 mg/dL   GFR, Estimated >60 >60 mL/min   Anion gap 9 5 - 15  Glucose, capillary     Status: Abnormal   Collection Time: 04/23/22  8:02 AM  Result Value Ref Range   Glucose-Capillary 147 (H) 70 - 99 mg/dL  Glucose, capillary     Status: Abnormal   Collection Time: 04/23/22 12:26 PM  Result Value Ref Range   Glucose-Capillary 141 (H) 70 - 99 mg/dL   CT CHEST ABDOMEN PELVIS W CONTRAST  Result Date: 04/23/2022 CLINICAL DATA:  Esophageal perforation EXAM: CT CHEST, ABDOMEN, AND PELVIS WITH CONTRAST TECHNIQUE: Multidetector CT imaging of the chest, abdomen and pelvis was performed following the standard protocol during bolus administration of intravenous contrast. RADIATION DOSE REDUCTION: This exam was performed according to the departmental dose-optimization program which includes automated exposure control, adjustment of the mA and/or kV according to patient size and/or use of iterative reconstruction technique. CONTRAST:  121m OMNIPAQUE  IOHEXOL 300 MG/ML SOLN, 549mOMNIPAQUE IOHEXOL 300 MG/ML SOLN COMPARISON:  Esophagram 04/21/2022, chest CT 04/20/2022, CT 04/18/2022. FINDINGS: CT CHEST FINDINGS Cardiovascular: Unchanged cardiomegaly.No pericardial disease.Coronary artery atherosclerosis.Normal size main and branch pulmonary arteries. No central PE. Moderate atherosclerosis of the thoracic aorta. There is noncalcified atherosclerotic plaque projecting towards the descending thoracic aortic lumen (series 2, image 26). Mediastinum/Nodes: No lymphadenopathy.Atrophic thyroid.Intraluminal contrast in the esophagus which remains intraluminal without evidence of contrast leak.There is severe distal esophageal wall thickening/submucosal edema. There is an extraluminal rim enhancing collection adjacent to the right aspect of the distal esophagus measuring 4.3 x 2.3 x 4.9 cm, previously measuring 4.4 x 2.3 x 4.9 cm, remeasured for consistency (series 2, image 38). Lungs/Pleura: Moderate centrilobular and paraseptal emphysema. There are small to moderate-sized bilateral pleural effusions, right greater than left with adjacent lower lung atelectasis. There is mildly thickened and enhancing pleura on the right. Effusions are similar in size to prior. There is a right basilar pigtail chest tube in place in the pleural space with punctate foci of gas within the right-sided effusion. No other pleural gas identified. Musculoskeletal: Unchanged mild anterior T10 compression deformity. No evidence of acute fracture. No aggressive osseous lesion. Multiple vertebral body hemangiomas. CT ABDOMEN PELVIS FINDINGS Hepatobiliary: No focal liver abnormality is seen. No gallstones, gallbladder wall thickening, or biliary dilatation. Pancreas: Unremarkable. No pancreatic ductal dilatation or surrounding inflammatory changes. Spleen: Normal in size without focal abnormality. Adrenals/Urinary Tract: Adrenal glands are unremarkable. No hydronephrosis or nephroureterolithiasis.  The bladder is moderately distended with intraluminal gas, likely related to recent catheterization. Foley catheter has been removed in the interim. Stomach/Bowel: Postsurgical changes of hiatal hernia repair. No evidence of contrast leak at the gastroesophageal junction. No evidence of bowel obstruction. There is a percutaneous jejunostomy tube in place. There is oral contrast material in the colon from recent esophagram. Extensive colonic diverticulosis primarily affecting the sigmoid, without evidence of acute diverticulitis. Generalized mild mesenteric edema. Vascular/Lymphatic: Aortoiliac atherosclerosis. No AAA. No lymphadenopathy. Reproductive: Unremarkable. Other: Percutaneous surgical drain courses to the pelvis thin cephalad with tip in the right anterior mid abdomen. The intra-abdominal portion of this drain has migrated towards the pelvis in  comparison to prior CT, on which the tip previously resided at the diaphragmatic hiatus. Small fat containing inguinal hernias, left greater than right, also with trace ascites in the left inguinal hernia. Trace perihepatic ascites. Musculoskeletal: No acute osseous abnormality. No suspicious osseous lesion. Degenerative changes of the spine and hips. Prior right proximal femur fixation. IMPRESSION: Postsurgical changes of hiatal hernia repair. Persistent extraluminal collection adjacent to the right aspect of the distal esophagus measuring 4.3 x 2.3 x 4.9 cm, unchanged from prior exam. No evidence of contrast leak to suggest persistent or recurrent perforation. Unchanged small to moderate-sized bilateral pleural effusions, right greater than left, with adjacent lower lung atelectasis. Right basilar chest tube in place with punctate foci of gas within the right-sided effusion. Caudal migration of the percutaneous surgical drain in the abdomen, tip now within the right anterior mid abdomen, which was previously at the diaphragmatic hiatus on prior CT. Percutaneous  jejunostomy tube in place. Trace perihepatic ascites. Distended urinary bladder with intraluminal glass presumably related to recent catheterization, which has been removed in the interval. Electronically Signed   By: Maurine Simmering M.D.   On: 04/23/2022 09:06   DG Chest Port 1 View  Result Date: 04/23/2022 CLINICAL DATA:  Chest tube in place EXAM: PORTABLE CHEST 1 VIEW COMPARISON:  CT chest 04/20/2022 FINDINGS: Bilateral emphysematous changes. Right-sided chest tube. Right pleural effusion. Small amount of loculated fluid in the minor fissure. Small left pleural effusion. Mild bilateral interstitial thickening. No pneumothorax. Stable cardiomediastinal silhouette. No acute osseous abnormality. IMPRESSION: 1. Right-sided chest tube with a small right pleural effusion. No pneumothorax. Electronically Signed   By: Kathreen Devoid M.D.   On: 04/23/2022 08:25     Assessment/Plan: Diagnosis: debility Does the need for close, 24 hr/day medical supervision in concert with the patient's rehab needs make it unreasonable for this patient to be served in a less intensive setting? Yes Co-Morbidities requiring supervision/potential complications:  Deconditioning: husband does not feel she can tolerate 3H of daily therapy right now, discussed that we will re-evaluate next week Atrial tachycardia: continue lopressor Hypokalemia: replete to goal of 4 Esophageal leak Mitral annular calcification Due to bladder management, bowel management, safety, skin/wound care, disease management, medication administration, pain management, and patient education, does the patient require 24 hr/day rehab nursing? Yes Does the patient require coordinated care of a physician, rehab nurse, therapy disciplines of PT, OT to address physical and functional deficits in the context of the above medical diagnosis(es)? Yes Addressing deficits in the following areas: balance, endurance, locomotion, strength, transferring, bowel/bladder control,  bathing, dressing, feeding, grooming, and psychosocial support Can the patient actively participate in an intensive therapy program of at least 3 hrs of therapy per day at least 5 days per week? Yes The potential for patient to make measurable gains while on inpatient rehab is excellent Anticipated functional outcomes upon discharge from inpatient rehab are modified independent  with PT, modified independent with OT, modified independent with SLP. Estimated rehab length of stay to reach the above functional goals is: 10-14 days Anticipated discharge destination: Home Overall Rehab/Functional Prognosis: excellent  RECOMMENDATIONS: This patient's condition is appropriate for continued rehabilitative care in the following setting: CIR Patient has agreed to participate in recommended program. Potentially Note that insurance prior authorization may be required for reimbursement for recommended care.    Izora Ribas, MD 04/23/2022

## 2022-04-23 NOTE — Progress Notes (Signed)
Mobility Specialist - Progress Note   04/23/22 1608  Mobility  Activity Transferred from bed to chair  Level of Assistance Minimal assist, patient does 75% or more  Assistive Device Front wheel walker  Distance Ambulated (ft) 4 ft  Activity Response Tolerated well  $Mobility charge 1 Mobility     Pt lying in bed upon arrival, utilizing 2L. Requesting to sit up. Pt able to complete bed mobility with minA; STS with minG (+2 for managing lines/tubes). Pt took 2 lateral steps towards HOB then step-pivot to recliner. No complaints. HR 80 bpm. Pt left in chair with alarm set, needs in reach. NT and spouse at bedside.    Kathee Delton Mobility Specialist 04/23/22, 4:12 PM

## 2022-04-23 NOTE — Care Management Important Message (Signed)
Important Message  Patient Details  Name: Tanaya Dunigan MRN: [id number] Date of Birth: [birth year]   Medicare Important Message Given:  Yes     Dannette Barbara 04/23/2022, 12:03 PM

## 2022-04-23 NOTE — Progress Notes (Signed)
Physical Therapy Treatment Patient Details Name: Kelly Wood MRN: [id number] DOB: 1941/01/13 Today's Date: 04/23/2022   History of Present Illness Pt is a 82 year old female  58 Days Post-Op s/p robotic assisted laparoscopic paraesophageal hernia repair complicated by esophageal leak requiring immediate take back for robotic assisted primary repair of esophageal perforation, placement of mediastinal drain, feeding jejunostomy placement, and right chest tube placements; On 1/14 1/14: New onset narrow complex tachycardia, SVT, transferred to stepdown.  Cardiology consulted.  Started on IV amiodarone; PMH significant for  HTN, HLD, hypothyroid, COPD, anemia, GERD, s/p repair of paraesophageal hernia robotically approximately 11 months ago    PT Comments    Pt recently returned from imaging with some nausea and fatigue with chest tube left on suction and activity performed at EOB.  JP drain emptied by nursing prior to session and clipped to gown with all lines/leads/tubes carefully monitored throughout the session with no concerns.  Pt was pleasant and put forth good effort throughout the session although she required frequent therapeutic rest breaks.  Pt's SpO2 and HR were both WNL throughout the session on 3LO2/min with no adverse symptoms reported by the pt other than back pain that did not worsen with activity. Pt will benefit from PT services in an IR setting upon discharge to safely address deficits listed in patient problem list for decreased caregiver assistance and eventual return to PLOF.       Recommendations for follow up therapy are one component of a multi-disciplinary discharge planning process, led by the attending physician.  Recommendations may be updated based on patient status, additional functional criteria and insurance authorization.  Follow Up Recommendations  Acute inpatient rehab (3hours/day)     Assistance Recommended at Discharge Intermittent Supervision/Assistance   Patient can return home with the following A little help with walking and/or transfers;A little help with bathing/dressing/bathroom;Assist for transportation;Help with stairs or ramp for entrance;Assistance with cooking/housework   Equipment Recommendations  None recommended by PT    Recommendations for Other Services       Precautions / Restrictions Precautions Precautions: Fall Precaution Comments: R chest tube, JP drain, G-tube Restrictions Weight Bearing Restrictions: No     Mobility  Bed Mobility Overal bed mobility: Needs Assistance Bed Mobility: Sit to Sidelying Rolling: Min assist Sidelying to sit: Min assist     Sit to sidelying: Min assist, HOB elevated General bed mobility comments: Min A for BLE and trunk control    Transfers Overall transfer level: Needs assistance Equipment used: Rolling walker (2 wheels) Transfers: Sit to/from Stand Sit to Stand: Min guard           General transfer comment: Min verbal cues for sequencing    Ambulation/Gait Ambulation/Gait assistance: Min guard Gait Distance (Feet): 25 Feet Assistive device: Rolling walker (2 wheels) Gait Pattern/deviations: Trunk flexed, Step-through pattern, Decreased step length - right, Decreased step length - left Gait velocity: decreased     General Gait Details: Slow cadence but generally steady without LOB   Stairs             Wheelchair Mobility    Modified Rankin (Stroke Patients Only)       Balance Overall balance assessment: Needs assistance   Sitting balance-Leahy Scale: Good     Standing balance support: Bilateral upper extremity supported, Reliant on assistive device for balance, During functional activity Standing balance-Leahy Scale: Fair  Cognition Arousal/Alertness: Awake/alert Behavior During Therapy: WFL for tasks assessed/performed Overall Cognitive Status: Within Functional Limits for tasks assessed                                           Exercises Total Joint Exercises Ankle Circles/Pumps: Strengthening, Both, 10 reps (with manual resistance) Quad Sets: Strengthening, Both, 10 reps Gluteal Sets: Strengthening, Both, 10 reps Long Arc Quad: AROM, Strengthening, Both, 10 reps Marching in Standing: Strengthening, Both, 10 reps, Standing    General Comments        Pertinent Vitals/Pain Pain Assessment Pain Assessment: 0-10 Pain Score: 7  Pain Location: back Pain Descriptors / Indicators: Sore Pain Intervention(s): Repositioned, Monitored during session, Patient requesting pain meds-RN notified, RN gave pain meds during session    Home Living                          Prior Function            PT Goals (current goals can now be found in the care plan section) Progress towards PT goals: Progressing toward goals    Frequency    Min 2X/week      PT Plan Current plan remains appropriate    Co-evaluation              AM-PAC PT "6 Clicks" Mobility   Outcome Measure  Help needed turning from your back to your side while in a flat bed without using bedrails?: A Little Help needed moving from lying on your back to sitting on the side of a flat bed without using bedrails?: A Little Help needed moving to and from a bed to a chair (including a wheelchair)?: A Little Help needed standing up from a chair using your arms (e.g., wheelchair or bedside chair)?: A Little Help needed to walk in hospital room?: A Little Help needed climbing 3-5 steps with a railing? : A Lot 6 Click Score: 17    End of Session Equipment Utilized During Treatment: Oxygen;Gait belt (gait belt under arms) Activity Tolerance: Patient tolerated treatment well Patient left: in bed;with bed alarm set;with call bell/phone within reach;with family/visitor present Nurse Communication: Mobility status PT Visit Diagnosis: Unsteadiness on feet (R26.81);Muscle weakness (generalized)  (M62.81);Pain Pain - Right/Left:  (back)     Time: 4627-0350 PT Time Calculation (min) (ACUTE ONLY): 40 min  Charges:  $Gait Training: 8-22 mins $Therapeutic Exercise: 8-22 mins $Therapeutic Activity: 8-22 mins                    D. Scott Kerrick Miler PT, DPT 04/23/22, 12:24 PM

## 2022-04-23 NOTE — Progress Notes (Signed)
Progress Note  Patient Name: Kelly Wood Date of Encounter: 04/23/2022  Primary Cardiologist: new - consult by O'Neal  Subjective   Converted from atrial tach to sinus rhythm around 2200 and has maintained sinus since. No longer on amiodarone gtt. Now on Lopressor 25 mg q 6 hours.    Inpatient Medications    Scheduled Meds:  Chlorhexidine Gluconate Cloth  6 each Topical Daily   Chlorhexidine Gluconate Cloth  6 each Topical Q0600   enoxaparin (LOVENOX) injection  40 mg Subcutaneous Q24H   feeding supplement  1 Container Oral TID BM   fiber supplement (BANATROL TF)  60 mL Per Tube BID   free water  80 mL Per Tube Q4H   levothyroxine  50 mcg Intravenous Daily   metoprolol tartrate  25 mg Oral Q6H   pantoprazole (PROTONIX) IV  40 mg Intravenous Q12H   Continuous Infusions:  sodium chloride Stopped (04/22/22 2207)   amiodarone 30 mg/hr (04/23/22 0700)   feeding supplement (OSMOLITE 1.2 CAL) 50 mL/hr at 04/23/22 0700   piperacillin-tazobactam (ZOSYN)  IV 3.375 g (04/23/22 0506)   PRN Meds: sodium chloride, acetaminophen, albuterol, [DISCONTINUED] diphenhydrAMINE **OR** diphenhydrAMINE, hydrALAZINE, loperamide, menthol-cetylpyridinium, metoprolol tartrate, morphine injection, naphazoline-glycerin, ondansetron (ZOFRAN) IV, oxyCODONE, phenol   Vital Signs    Vitals:   04/22/22 2024 04/22/22 2328 04/23/22 0358 04/23/22 0803  BP: (!) 131/106 129/64 133/70 (!) 140/85  Pulse: (!) 131 73 76 72  Resp: '19 18 18   '$ Temp: 98 F (36.7 C) 98.6 F (37 C) 98.2 F (36.8 C) 98.2 F (36.8 C)  TempSrc:    Oral  SpO2: 98% 98% 97% 98%  Weight:      Height:        Intake/Output Summary (Last 24 hours) at 04/23/2022 1141 Last data filed at 04/23/2022 0700 Gross per 24 hour  Intake 1576.26 ml  Output 170 ml  Net 1406.26 ml    Filed Weights   04/17/22 0427 04/18/22 0500 04/20/22 0334  Weight: 62.9 kg 62.5 kg 62.5 kg    Telemetry    Converted from atrial tach to sinus rhythm  around 2200 on 04/22/2022 and has maintained sinus since - Personally Reviewed  ECG    No new tracings - Personally Reviewed  Physical Exam   GEN: No acute distress.   Neck: No JVD. Cardiac: RRR, no murmurs, rubs, or gallops.  Respiratory: Clear to auscultation bilaterally.  GI: Soft, nontender, non-distended.   MS: No edema; No deformity. Neuro:  Alert and oriented x 3; Nonfocal.  Psych: Normal affect.  Labs    Chemistry Recent Labs  Lab 04/17/22 [phone] 04/18/22 0616 04/19/22 0413 04/20/22 0323 04/21/22 0436 04/22/22 0558 04/23/22 0526  NA 134* 135 136   < > 131* 128* 130*  K 3.3* 3.6 4.0   < > 3.4* 3.5 3.6  CL 103 106 103   < > 98 94* 95*  CO2 '24 24 25   '$ < > '26 26 26  '$ GLUCOSE 131* 149* 118*   < > 129* 165* 150*  BUN '21 15 15   '$ < > '16 20 19  '$ CREATININE 0.73 0.50 0.63   < > 0.52 0.42* 0.58  CALCIUM 7.5* 7.4* 7.7*   < > 7.7* 7.4* 7.5*  PROT 5.3* 5.5* 5.8*  --   --   --   --   ALBUMIN 2.3* 2.2* 2.2*  --   --   --   --   AST 110* 61* 31  --   --   --   --  ALT 87* 78* 54*  --   --   --   --   ALKPHOS 63 111 122  --   --   --   --   BILITOT 0.2* 0.5 0.5  --   --   --   --   GFRNONAA >60 >60 >60   < > >60 >60 >60  ANIONGAP '7 5 8   '$ < > '7 8 9   '$ < > = values in this interval not displayed.      Hematology Recent Labs  Lab 04/20/22 0323 04/21/22 0436 04/23/22 0526  WBC 11.3* 15.4* 18.5*  RBC 3.71* 3.75* 3.81*  HGB 10.1* 10.1* 10.3*  HCT 30.8* 31.4* 31.6*  MCV 83.0 83.7 82.9  MCH 27.2 26.9 27.0  MCHC 32.8 32.2 32.6  RDW 14.7 14.7 15.1  PLT 315 330 416*     Cardiac EnzymesNo results for input(s): "TROPONINI" in the last 168 hours. No results for input(s): "TROPIPOC" in the last 168 hours.   BNPNo results for input(s): "BNP", "PROBNP" in the last 168 hours.   DDimer No results for input(s): "DDIMER" in the last 168 hours.   Radiology    DG ESOPHAGUS W SINGLE CM (SOL OR THIN BA)  Result Date: 04/21/2022 IMPRESSION: 1. No extraluminal contrast to suggest  esophageal perforation. Electronically Signed   By: Kathreen Devoid M.D.   On: 04/21/2022 14:50   CT CHEST WO CONTRAST  Result Date: 04/20/2022 IMPRESSION: 1. Interval replacement of the right chest tube, with new pigtail drainage catheter traversing the right pleural effusion. No change in right pleural effusion volume. Minimal gas in the right pleural space consistent with recent intervention. 2. Stable small left pleural effusion. 3. Emphysema, with stable dependent lower lobe atelectasis. 4. Postsurgical changes at the gastroesophageal junction, with stable enteric catheter and hiatal hernia. Evaluation of the surgical site is limited without intravenous contrast. 5. Aortic Atherosclerosis (ICD10-I70.0). Coronary artery atherosclerosis. Electronically Signed   By: Randa Ngo M.D.   On: 04/20/2022 16:11    Cardiac Studies   2D echo 04/19/2022: 1. Left ventricular ejection fraction, by estimation, is 55 to 60%. The  left ventricle has normal function. Left ventricular endocardial border  not optimally defined to evaluate regional wall motion. Left ventricular  diastolic parameters are consistent  with Grade I diastolic dysfunction (impaired relaxation).   2. Right ventricular systolic function is normal. The right ventricular  size is normal. There is moderately elevated pulmonary artery systolic  pressure. The estimated right ventricular systolic pressure is 65.4 mmHg.   3. Left atrial size was mildly dilated.   4. The mitral valve is normal in structure. Mild mitral valve  regurgitation. No evidence of mitral stenosis. Moderate mitral annular  calcification.   5. The aortic valve is normal in structure. Aortic valve regurgitation is  not visualized. Aortic valve sclerosis is present, with no evidence of  aortic valve stenosis.   Patient Profile     82 y.o. female with history of HTN, COPD, B12 deficiency, paraesophageal hernia s/p robotic assisted laparoscopic paraesophageal hernia  repair complicated by esophageal leak requiring immediate take back for robotic assisted primary repair of esophageal perforation, placement of mediastinal drain, feeding jejunostomy placement, and right chest tube placements  and who we are seeing for atrial tachycardia.  Assessment & Plan    1. Atrial tachycardia: -Improved, now maintaining sinus rhythm since 2200 on 1/18 -Off amiodarone gtt -Consolidate Lopressor to 50 mg bid -TSH mildly elevated with normal free T4  2. Hypokalemia: -Improving -Replete to goal 4.0 via IV -Magnesium at goal  For questions or updates, please contact [place] Please consult [email or web address] for contact info under Cardiology/STEMI.    Signed, Christell Faith, PA-C [facility] Pager: [phone] 04/23/2022, 11:41 AM

## 2022-04-23 NOTE — Progress Notes (Signed)
Patient's iv infiltrated. Notified Randol Kern NP that it was an amiodarone infusion. The hyaluronidase protocol was initiated. Patient complained of arm having a burning sensation after injections. Was not able to apply a warm compress due to discomfort.

## 2022-04-23 NOTE — Progress Notes (Signed)
Mobility Specialist - Progress Note   04/23/22 1100  Mobility  Activity Refused mobility     Pt politely declined mobility; just finished PT session and needs time to recover. Will attempt another date/time.    Kathee Delton Mobility Specialist 04/23/22, 11:04 AM

## 2022-04-23 NOTE — Plan of Care (Signed)
  Problem: Education: Goal: Knowledge of General Education information will improve Description: Including pain rating scale, medication(s)/side effects and non-pharmacologic comfort measures Outcome: Progressing

## 2022-04-23 NOTE — Progress Notes (Signed)
   04/22/22 2024  Assess: MEWS Score  Temp 98 F (36.7 C)  BP (!) 131/106  MAP (mmHg) 115  Pulse Rate (!) 131  Resp 19  SpO2 98 %  O2 Device Nasal Cannula  O2 Flow Rate (L/min) 2 L/min  Assess: MEWS Score  MEWS Temp 0  MEWS Systolic 0  MEWS Pulse 3  MEWS RR 0  MEWS LOC 0  MEWS Score 3  MEWS Score Color Yellow  Treat  Pain Scale 0-10  Pain Score 8  Pain Type Acute pain  Pain Location Abdomen  Pain Intervention(s) Medication (See eMAR)  Assess: SIRS CRITERIA  SIRS Temperature  0  SIRS Pulse 1  SIRS Respirations  0  SIRS WBC 0  SIRS Score Sum  1   Patient's iv with the amiodarone drip infusion infiltrated. Heart rate increased. Administered iv and oral metoprolol. New iv was placed and infusion restarted.

## 2022-04-23 NOTE — Progress Notes (Signed)
CC: POD # 7 s/p redo hiatal hernia and take back for eso perf and jejunostomy  Subjective: Feling better Wbc went up, I obtained CT esophagogram and A/P , no evdience of leak, small mediastinal collection, mediatinal drain now intra-abdominally. J in place, no abscess Some diarrhea w TF  Tolerating clears  Amio infusion extravasated  Some SVT  Objective: Vital signs in last 24 hours: Temp:  [97.8 F (36.6 C)-98.6 F (37 C)] 98.2 F (36.8 C) (01/19 0803) Pulse Rate:  [68-131] 72 (01/19 0803) Resp:  [14-19] 18 (01/19 0358) BP: (129-147)/(64-106) 140/85 (01/19 0803) SpO2:  [97 %-99 %] 98 % (01/19 0803) Last BM Date : 04/23/22  Intake/Output from previous day: 01/18 0701 - 01/19 0700 In: 1576.3 [I.V.:337.6; NG/GT:1138.7; IV Piggyback:100] Out: 370 [Urine:200; Drains:120; Chest Tube:50] Intake/Output this shift: No intake/output data recorded.  Physical exam:  NAD, chronically ill and debilitated Chest: right CT in place no air leak serous drainage Abd: soft, nt, incision c/d/I drain serous  Lab Results: CBC  Recent Labs    04/21/22 0436 04/23/22 0526  WBC 15.4* 18.5*  HGB 10.1* 10.3*  HCT 31.4* 31.6*  PLT 330 416*   BMET Recent Labs    04/22/22 0558 04/23/22 0526  NA 128* 130*  K 3.5 3.6  CL 94* 95*  CO2 26 26  GLUCOSE 165* 150*  BUN 20 19  CREATININE 0.42* 0.58  CALCIUM 7.4* 7.5*   PT/INR No results for input(s): "LABPROT", "INR" in the last 72 hours. ABG No results for input(s): "PHART", "HCO3" in the last 72 hours.  Invalid input(s): "PCO2", "PO2"  Studies/Results: CT CHEST ABDOMEN PELVIS W CONTRAST  Result Date: 04/23/2022 CLINICAL DATA:  Esophageal perforation EXAM: CT CHEST, ABDOMEN, AND PELVIS WITH CONTRAST TECHNIQUE: Multidetector CT imaging of the chest, abdomen and pelvis was performed following the standard protocol during bolus administration of intravenous contrast. RADIATION DOSE REDUCTION: This exam was performed according to the  departmental dose-optimization program which includes automated exposure control, adjustment of the mA and/or kV according to patient size and/or use of iterative reconstruction technique. CONTRAST:  133m OMNIPAQUE IOHEXOL 300 MG/ML SOLN, 568mOMNIPAQUE IOHEXOL 300 MG/ML SOLN COMPARISON:  Esophagram 04/21/2022, chest CT 04/20/2022, CT 04/18/2022. FINDINGS: CT CHEST FINDINGS Cardiovascular: Unchanged cardiomegaly.No pericardial disease.Coronary artery atherosclerosis.Normal size main and branch pulmonary arteries. No central PE. Moderate atherosclerosis of the thoracic aorta. There is noncalcified atherosclerotic plaque projecting towards the descending thoracic aortic lumen (series 2, image 26). Mediastinum/Nodes: No lymphadenopathy.Atrophic thyroid.Intraluminal contrast in the esophagus which remains intraluminal without evidence of contrast leak.There is severe distal esophageal wall thickening/submucosal edema. There is an extraluminal rim enhancing collection adjacent to the right aspect of the distal esophagus measuring 4.3 x 2.3 x 4.9 cm, previously measuring 4.4 x 2.3 x 4.9 cm, remeasured for consistency (series 2, image 38). Lungs/Pleura: Moderate centrilobular and paraseptal emphysema. There are small to moderate-sized bilateral pleural effusions, right greater than left with adjacent lower lung atelectasis. There is mildly thickened and enhancing pleura on the right. Effusions are similar in size to prior. There is a right basilar pigtail chest tube in place in the pleural space with punctate foci of gas within the right-sided effusion. No other pleural gas identified. Musculoskeletal: Unchanged mild anterior T10 compression deformity. No evidence of acute fracture. No aggressive osseous lesion. Multiple vertebral body hemangiomas. CT ABDOMEN PELVIS FINDINGS Hepatobiliary: No focal liver abnormality is seen. No gallstones, gallbladder wall thickening, or biliary dilatation. Pancreas: Unremarkable. No  pancreatic ductal dilatation or surrounding inflammatory  changes. Spleen: Normal in size without focal abnormality. Adrenals/Urinary Tract: Adrenal glands are unremarkable. No hydronephrosis or nephroureterolithiasis. The bladder is moderately distended with intraluminal gas, likely related to recent catheterization. Foley catheter has been removed in the interim. Stomach/Bowel: Postsurgical changes of hiatal hernia repair. No evidence of contrast leak at the gastroesophageal junction. No evidence of bowel obstruction. There is a percutaneous jejunostomy tube in place. There is oral contrast material in the colon from recent esophagram. Extensive colonic diverticulosis primarily affecting the sigmoid, without evidence of acute diverticulitis. Generalized mild mesenteric edema. Vascular/Lymphatic: Aortoiliac atherosclerosis. No AAA. No lymphadenopathy. Reproductive: Unremarkable. Other: Percutaneous surgical drain courses to the pelvis thin cephalad with tip in the right anterior mid abdomen. The intra-abdominal portion of this drain has migrated towards the pelvis in comparison to prior CT, on which the tip previously resided at the diaphragmatic hiatus. Small fat containing inguinal hernias, left greater than right, also with trace ascites in the left inguinal hernia. Trace perihepatic ascites. Musculoskeletal: No acute osseous abnormality. No suspicious osseous lesion. Degenerative changes of the spine and hips. Prior right proximal femur fixation. IMPRESSION: Postsurgical changes of hiatal hernia repair. Persistent extraluminal collection adjacent to the right aspect of the distal esophagus measuring 4.3 x 2.3 x 4.9 cm, unchanged from prior exam. No evidence of contrast leak to suggest persistent or recurrent perforation. Unchanged small to moderate-sized bilateral pleural effusions, right greater than left, with adjacent lower lung atelectasis. Right basilar chest tube in place with punctate foci of gas within  the right-sided effusion. Caudal migration of the percutaneous surgical drain in the abdomen, tip now within the right anterior mid abdomen, which was previously at the diaphragmatic hiatus on prior CT. Percutaneous jejunostomy tube in place. Trace perihepatic ascites. Distended urinary bladder with intraluminal glass presumably related to recent catheterization, which has been removed in the interval. Electronically Signed   By: Maurine Simmering M.D.   On: 04/23/2022 09:06   DG Chest Port 1 View  Result Date: 04/23/2022 CLINICAL DATA:  Chest tube in place EXAM: PORTABLE CHEST 1 VIEW COMPARISON:  CT chest 04/20/2022 FINDINGS: Bilateral emphysematous changes. Right-sided chest tube. Right pleural effusion. Small amount of loculated fluid in the minor fissure. Small left pleural effusion. Mild bilateral interstitial thickening. No pneumothorax. Stable cardiomediastinal silhouette. No acute osseous abnormality. IMPRESSION: 1. Right-sided chest tube with a small right pleural effusion. No pneumothorax. Electronically Signed   By: Kathreen Devoid M.D.   On: 04/23/2022 08:25   DG ESOPHAGUS W SINGLE CM (SOL OR THIN BA)  Result Date: 04/21/2022 CLINICAL DATA:  Paraesophageal hernia repair with perforation EXAM: ESOPHOGRAM/BARIUM SWALLOW TECHNIQUE: Single contrast examination was performed using thin barium or water soluble. FLUOROSCOPY: Radiation Exposure Index (as provided by the fluoroscopic device): 6.6 mGy COMPARISON:  CT chest 04/20/2022, 04/15/2022 FINDINGS: Water-soluble oral contrast material was ingested by mouth. Contrast opacifies the esophagus without extraluminal contrast. Postsurgical changes involving the proximal stomach. No extraluminal contrast to suggest esophageal perforation. Right-sided chest tube in satisfactory position partially visualized. IMPRESSION: 1. No extraluminal contrast to suggest esophageal perforation. Electronically Signed   By: Kathreen Devoid M.D.   On: 04/21/2022 14:50     Anti-infectives: Anti-infectives (From admission, onward)    Start     Dose/Rate Route Frequency Ordered Stop   04/15/22 1730  piperacillin-tazobactam (ZOSYN) IVPB 3.375 g  Status:  Discontinued        3.375 g 100 mL/hr over 30 Minutes Intravenous Every 8 hours 04/15/22 1716 04/15/22 1719   04/15/22 1730  piperacillin-tazobactam (ZOSYN) IVPB 3.375 g        3.375 g 12.5 mL/hr over 240 Minutes Intravenous Every 8 hours 04/15/22 1719     04/15/22 1730  piperacillin-tazobactam (ZOSYN) 3.375 GM/50ML IVPB       Note to Pharmacy: Rutherford Nail E: cabinet override      04/15/22 1730 04/16/22 0544   04/15/22 1445  ceFAZolin (ANCEF) IVPB 2g/100 mL premix  Status:  Discontinued        2 g 200 mL/hr over 30 Minutes Intravenous Every 8 hours 04/15/22 1435 04/15/22 1719   04/15/22 0645  ceFAZolin (ANCEF) IVPB 2g/100 mL premix        2 g 200 mL/hr over 30 Minutes Intravenous On call to O.R. 04/15/22 [id number] 04/15/22 0806       Assessment/Plan: Doing well Continue liquids and go slow , no carbonation No need for surgical intervention Mobilize Continue enteral nutrition  [name], MD, FACS  04/23/2022

## 2022-04-24 DIAGNOSIS — Z8719 Personal history of other diseases of the digestive system: Secondary | ICD-10-CM | POA: Diagnosis not present

## 2022-04-24 DIAGNOSIS — Z9889 Other specified postprocedural states: Secondary | ICD-10-CM | POA: Diagnosis not present

## 2022-04-24 DIAGNOSIS — I4719 Other supraventricular tachycardia: Secondary | ICD-10-CM | POA: Diagnosis not present

## 2022-04-24 LAB — GLUCOSE, CAPILLARY
Glucose-Capillary: 109 mg/dL — ABNORMAL HIGH (ref 70–99)
Glucose-Capillary: 118 mg/dL — ABNORMAL HIGH (ref 70–99)
Glucose-Capillary: 120 mg/dL — ABNORMAL HIGH (ref 70–99)
Glucose-Capillary: 124 mg/dL — ABNORMAL HIGH (ref 70–99)
Glucose-Capillary: 155 mg/dL — ABNORMAL HIGH (ref 70–99)

## 2022-04-24 MED ORDER — ORAL CARE MOUTH RINSE: 15.0000 mL | OROMUCOSAL | Status: DC | PRN

## 2022-04-24 NOTE — Progress Notes (Signed)
CC: POD # 8  s/p redo hiatal hernia and take back for eso perf and jejunostomy  Tolerating tube feeds Very weak On and off SVT   Objective: Vital signs in last 24 hours: Temp:  [97.9 F (36.6 C)-98.9 F (37.2 C)] 98.1 F (36.7 C) (01/20 1551) Pulse Rate:  [72-132] 128 (01/20 1551) Resp:  [16-18] 18 (01/20 1551) BP: (98-154)/(69-85) 114/81 (01/20 1551) SpO2:  [96 %-99 %] 97 % (01/20 1551) Weight:  [60.2 kg] 60.2 kg (01/20 0345) Last BM Date : 04/23/22  Intake/Output from previous day: 01/19 0701 - 01/20 0700 In: 2403.3 [P.O.:760; I.V.:46.3; NG/GT:1497; IV Piggyback:100] Out: 400 [Urine:200; Emesis/NG output:50; Drains:150] Intake/Output this shift: Total I/O In: 25 [Other:25] Out: 25 [Drains:25]  Physical exam:  NAD, chronically ill and debilitated Chest: right CT in place no air leak serous drainage Abd: soft, nt, incision c/d/I drain serous   Lab Results: CBC  Recent Labs    04/23/22 0526  WBC 18.5*  HGB 10.3*  HCT 31.6*  PLT 416*   BMET Recent Labs    04/22/22 0558 04/23/22 0526  NA 128* 130*  K 3.5 3.6  CL 94* 95*  CO2 26 26  GLUCOSE 165* 150*  BUN 20 19  CREATININE 0.42* 0.58  CALCIUM 7.4* 7.5*   PT/INR No results for input(s): "LABPROT", "INR" in the last 72 hours. ABG No results for input(s): "PHART", "HCO3" in the last 72 hours.  Invalid input(s): "PCO2", "PO2"  Studies/Results: CT CHEST ABDOMEN PELVIS W CONTRAST  Result Date: 04/23/2022 CLINICAL DATA:  Esophageal perforation EXAM: CT CHEST, ABDOMEN, AND PELVIS WITH CONTRAST TECHNIQUE: Multidetector CT imaging of the chest, abdomen and pelvis was performed following the standard protocol during bolus administration of intravenous contrast. RADIATION DOSE REDUCTION: This exam was performed according to the departmental dose-optimization program which includes automated exposure control, adjustment of the mA and/or kV according to patient size and/or use of iterative reconstruction technique.  CONTRAST:  139m OMNIPAQUE IOHEXOL 300 MG/ML SOLN, 561mOMNIPAQUE IOHEXOL 300 MG/ML SOLN COMPARISON:  Esophagram 04/21/2022, chest CT 04/20/2022, CT 04/18/2022. FINDINGS: CT CHEST FINDINGS Cardiovascular: Unchanged cardiomegaly.No pericardial disease.Coronary artery atherosclerosis.Normal size main and branch pulmonary arteries. No central PE. Moderate atherosclerosis of the thoracic aorta. There is noncalcified atherosclerotic plaque projecting towards the descending thoracic aortic lumen (series 2, image 26). Mediastinum/Nodes: No lymphadenopathy.Atrophic thyroid.Intraluminal contrast in the esophagus which remains intraluminal without evidence of contrast leak.There is severe distal esophageal wall thickening/submucosal edema. There is an extraluminal rim enhancing collection adjacent to the right aspect of the distal esophagus measuring 4.3 x 2.3 x 4.9 cm, previously measuring 4.4 x 2.3 x 4.9 cm, remeasured for consistency (series 2, image 38). Lungs/Pleura: Moderate centrilobular and paraseptal emphysema. There are small to moderate-sized bilateral pleural effusions, right greater than left with adjacent lower lung atelectasis. There is mildly thickened and enhancing pleura on the right. Effusions are similar in size to prior. There is a right basilar pigtail chest tube in place in the pleural space with punctate foci of gas within the right-sided effusion. No other pleural gas identified. Musculoskeletal: Unchanged mild anterior T10 compression deformity. No evidence of acute fracture. No aggressive osseous lesion. Multiple vertebral body hemangiomas. CT ABDOMEN PELVIS FINDINGS Hepatobiliary: No focal liver abnormality is seen. No gallstones, gallbladder wall thickening, or biliary dilatation. Pancreas: Unremarkable. No pancreatic ductal dilatation or surrounding inflammatory changes. Spleen: Normal in size without focal abnormality. Adrenals/Urinary Tract: Adrenal glands are unremarkable. No hydronephrosis or  nephroureterolithiasis. The bladder is moderately distended with  intraluminal gas, likely related to recent catheterization. Foley catheter has been removed in the interim. Stomach/Bowel: Postsurgical changes of hiatal hernia repair. No evidence of contrast leak at the gastroesophageal junction. No evidence of bowel obstruction. There is a percutaneous jejunostomy tube in place. There is oral contrast material in the colon from recent esophagram. Extensive colonic diverticulosis primarily affecting the sigmoid, without evidence of acute diverticulitis. Generalized mild mesenteric edema. Vascular/Lymphatic: Aortoiliac atherosclerosis. No AAA. No lymphadenopathy. Reproductive: Unremarkable. Other: Percutaneous surgical drain courses to the pelvis thin cephalad with tip in the right anterior mid abdomen. The intra-abdominal portion of this drain has migrated towards the pelvis in comparison to prior CT, on which the tip previously resided at the diaphragmatic hiatus. Small fat containing inguinal hernias, left greater than right, also with trace ascites in the left inguinal hernia. Trace perihepatic ascites. Musculoskeletal: No acute osseous abnormality. No suspicious osseous lesion. Degenerative changes of the spine and hips. Prior right proximal femur fixation. IMPRESSION: Postsurgical changes of hiatal hernia repair. Persistent extraluminal collection adjacent to the right aspect of the distal esophagus measuring 4.3 x 2.3 x 4.9 cm, unchanged from prior exam. No evidence of contrast leak to suggest persistent or recurrent perforation. Unchanged small to moderate-sized bilateral pleural effusions, right greater than left, with adjacent lower lung atelectasis. Right basilar chest tube in place with punctate foci of gas within the right-sided effusion. Caudal migration of the percutaneous surgical drain in the abdomen, tip now within the right anterior mid abdomen, which was previously at the diaphragmatic hiatus on  prior CT. Percutaneous jejunostomy tube in place. Trace perihepatic ascites. Distended urinary bladder with intraluminal glass presumably related to recent catheterization, which has been removed in the interval. Electronically Signed   By: Maurine Simmering M.D.   On: 04/23/2022 09:06   DG Chest Port 1 View  Result Date: 04/23/2022 CLINICAL DATA:  Chest tube in place EXAM: PORTABLE CHEST 1 VIEW COMPARISON:  CT chest 04/20/2022 FINDINGS: Bilateral emphysematous changes. Right-sided chest tube. Right pleural effusion. Small amount of loculated fluid in the minor fissure. Small left pleural effusion. Mild bilateral interstitial thickening. No pneumothorax. Stable cardiomediastinal silhouette. No acute osseous abnormality. IMPRESSION: 1. Right-sided chest tube with a small right pleural effusion. No pneumothorax. Electronically Signed   By: Kathreen Devoid M.D.   On: 04/23/2022 08:25    Anti-infectives: Anti-infectives (From admission, onward)    Start     Dose/Rate Route Frequency Ordered Stop   04/15/22 1730  piperacillin-tazobactam (ZOSYN) IVPB 3.375 g  Status:  Discontinued        3.375 g 100 mL/hr over 30 Minutes Intravenous Every 8 hours 04/15/22 1716 04/15/22 1719   04/15/22 1730  piperacillin-tazobactam (ZOSYN) IVPB 3.375 g        3.375 g 12.5 mL/hr over 240 Minutes Intravenous Every 8 hours 04/15/22 1719 04/29/22 2359   04/15/22 1730  piperacillin-tazobactam (ZOSYN) 3.375 GM/50ML IVPB       Note to Pharmacy: Rutherford Nail E: cabinet override      04/15/22 1730 04/16/22 0544   04/15/22 1445  ceFAZolin (ANCEF) IVPB 2g/100 mL premix  Status:  Discontinued        2 g 200 mL/hr over 30 Minutes Intravenous Every 8 hours 04/15/22 1435 04/15/22 1719   04/15/22 0645  ceFAZolin (ANCEF) IVPB 2g/100 mL premix        2 g 200 mL/hr over 30 Minutes Intravenous On call to O.R. 04/15/22 5027 04/15/22 0806       Assessment/Plan:  No  further surgical issues at this time Continue enteral nutrition and  completion of a/bs, we will keep CT for the weekend May need to re-evaluate Ct chest to make sure no other loculations are found  [name], MD, [facility]  04/24/2022

## 2022-04-24 NOTE — Progress Notes (Signed)
Patient complained back ache. Patient just finished being assisted with bedpan. Heart rate increased after being settled. PRN Metoprolol given. Will monitor for effect.

## 2022-04-24 NOTE — Progress Notes (Signed)
Physical Therapy Treatment Patient Details Name: Kelly Wood MRN: [id number] DOB: 1940/09/16 Today's Date: 04/24/2022   History of Present Illness Pt is a 82 year old female  68 Days Post-Op s/p robotic assisted laparoscopic paraesophageal hernia repair complicated by esophageal leak requiring immediate take back for robotic assisted primary repair of esophageal perforation, placement of mediastinal drain, feeding jejunostomy placement, and right chest tube placements; On 1/14 1/14: New onset narrow complex tachycardia, SVT, transferred to stepdown.  Cardiology consulted.  Started on IV amiodarone; PMH significant for  HTN, HLD, hypothyroid, COPD, anemia, GERD, s/p repair of paraesophageal hernia robotically approximately 11 months ago    PT Comments    Pt pleasant and put forth good effort during the session but was ultimately limited by nausea, nursing aware.  Pt required only minimal assist with bed mobility tasks and CGA with transfers and ambulation but was limited to ambulating only a few feet near the EOB before requesting to defer further ambulation due to nausea.  Pt was generally steady with standing activities with no overt LOB and with no adverse symptoms noted other than nausea, SpO2 and HR WNL.  Pt will benefit from PT services in an IR setting upon discharge to safely address deficits listed in patient problem list for decreased caregiver assistance and eventual return to PLOF.    Recommendations for follow up therapy are one component of a multi-disciplinary discharge planning process, led by the attending physician.  Recommendations may be updated based on patient status, additional functional criteria and insurance authorization.  Follow Up Recommendations  Acute inpatient rehab (3hours/day)     Assistance Recommended at Discharge Intermittent Supervision/Assistance  Patient can return home with the following A little help with walking and/or transfers;A little help with  bathing/dressing/bathroom;Assist for transportation;Help with stairs or ramp for entrance;Assistance with cooking/housework   Equipment Recommendations  None recommended by PT    Recommendations for Other Services       Precautions / Restrictions Precautions Precautions: Fall Precaution Comments: R chest tube, JP drain, G-tube Restrictions Weight Bearing Restrictions: No     Mobility  Bed Mobility Overal bed mobility: Needs Assistance       Supine to sit: Min assist Sit to supine: Min assist   General bed mobility comments: Min A for BLE and trunk control and cues for use of bed rail    Transfers Overall transfer level: Needs assistance Equipment used: Rolling walker (2 wheels) Transfers: Sit to/from Stand Sit to Stand: Min guard           General transfer comment: Min verbal cues for hand placement    Ambulation/Gait Ambulation/Gait assistance: Min guard Gait Distance (Feet): 3 Feet Assistive device: Rolling walker (2 wheels) Gait Pattern/deviations: Trunk flexed, Step-through pattern, Decreased step length - right, Decreased step length - left Gait velocity: decreased     General Gait Details: Slow cadence but generally steady without LOB   Stairs             Wheelchair Mobility    Modified Rankin (Stroke Patients Only)       Balance Overall balance assessment: Needs assistance Sitting-balance support: Feet supported Sitting balance-Leahy Scale: Good     Standing balance support: Bilateral upper extremity supported, Reliant on assistive device for balance, During functional activity Standing balance-Leahy Scale: Fair                              Cognition Arousal/Alertness: Awake/alert Behavior  During Therapy: Flat affect Overall Cognitive Status: Within Functional Limits for tasks assessed                                          Exercises Total Joint Exercises Ankle Circles/Pumps: Strengthening,  Both, 10 reps Quad Sets: Strengthening, Both, 10 reps Long Arc Quad: AROM, Strengthening, Both, 10 reps Knee Flexion: AROM, Strengthening, Both, 10 reps Other Exercises Other Exercises: Static unsupported sitting at EOB for core therex and increased activity tolerance    General Comments        Pertinent Vitals/Pain Pain Assessment Pain Assessment: 0-10 Pain Score: 7  Pain Location: back Pain Descriptors / Indicators: Sore Pain Intervention(s): Repositioned, Premedicated before session, Monitored during session    Home Living                          Prior Function            PT Goals (current goals can now be found in the care plan section) Progress towards PT goals: Not progressing toward goals - comment (limited by nausea)    Frequency    Min 2X/week      PT Plan Current plan remains appropriate    Co-evaluation              AM-PAC PT "6 Clicks" Mobility   Outcome Measure  Help needed turning from your back to your side while in a flat bed without using bedrails?: A Little Help needed moving from lying on your back to sitting on the side of a flat bed without using bedrails?: A Little Help needed moving to and from a bed to a chair (including a wheelchair)?: A Little Help needed standing up from a chair using your arms (e.g., wheelchair or bedside chair)?: A Little Help needed to walk in hospital room?: A Little Help needed climbing 3-5 steps with a railing? : A Lot 6 Click Score: 17    End of Session Equipment Utilized During Treatment: Oxygen;Gait belt Activity Tolerance: Other (comment) (limited by nausea) Patient left: in bed;with bed alarm set;with call bell/phone within reach;with family/visitor present Nurse Communication: Mobility status PT Visit Diagnosis: Unsteadiness on feet (R26.81);Muscle weakness (generalized) (M62.81);Pain     Time: 1610-9604 PT Time Calculation (min) (ACUTE ONLY): 34 min  Charges:  $Therapeutic  Exercise: 8-22 mins $Therapeutic Activity: 8-22 mins                     D. Scott Denys Salinger PT, DPT 04/24/22, 11:46 AM

## 2022-04-24 NOTE — Progress Notes (Signed)
Progress Note    Kelly Wood  UKG:[id number] DOB: 02-08-1941  DOA: 04/15/2022 PCP: Idelle Crouch, MD      Brief Narrative:    Medical records reviewed and are as summarized below:  Kelly Wood is a 82 y.o. female with PMH of HTN, HLD, hypothyroid, COPD, anemia, GERD, s/p repair of paraesophageal hernia robotically approximately 11 months ago, presented to ED on 04/15/2022 with persistent bloating, eructation and chest discomfort.  She was diagnosed with symptomatic recurrent paraesophageal hernia.  Admitted by the surgical team.  She underwent robotic assisted laparoscopic repair of paraesophageal hernia with bio mesh and Nissen fundoplication, redo on 7/62 which was complicated by esophageal perforation, right pneumothorax requiring right chest tube placement, robotic assisted repair of esophageal perforation, jejunostomy tube, mediastinal Blake drain placement.  TRH consulted 1/12 for management of medical issues.  1/14: New onset narrow complex tachycardia, SVT, transferred to stepdown.  Cardiology consulted.  Started on IV amiodarone and maintaining SR.        Assessment/Plan:   Principal Problem:   S/P repair of paraesophageal hernia Active Problems:   Esophageal perforation   SVT (supraventricular tachycardia)   Atrial tachycardia   Hiatal hernia   Nutrition Problem: Increased nutrient needs Etiology: post-op healing  Signs/Symptoms: estimated needs   Body mass index is 25.92 kg/m.   SVT/paroxysmal atrial tachycardia: Continue metoprolol.  She is no longer on IV amiodarone.  Cardiology signed off on 04/23/2018.  2D echo showed EF estimated at 55 to 83%, grade 1 diastolic dysfunction, moderately elevated pulmonary artery systolic pressure  Delirium, intermittent confusion: Avoid benzodiazepines because patient reportedly had a paradoxical response to IV Ativan.  Minimize opioids and sedating medications.   S/p robotic assisted laparoscopic  paraesophageal hernia repair 1/51, complicated by esophageal leak requiring immediate takeback for robotic assisted primary repair of esophageal perforation, placement of mediastinal drain/right chest tube/feeding jejunostomy tube, leukocytosis: She is on IV Zosyn.  She is on enteral nutrition via jejunostomy tube. CT chest abdomen pelvis on 04/23/2022 did not show any evidence of contrast leak to suggest persistent or recurrent esophageal perforation.   Hypokalemia: Improved  Hyponatremia: Monitor sodium level   Other comorbidities include hyperlipidemia, hypothyroidism, hypertension, chronic anemia   Diet Order             Diet clear liquid Fluid consistency: Thin  Diet effective now                            Consultants: Cardiologist General surgeon  Procedures:  Right chest tube placement Robotic assisted primary repair of esophageal perforation Robotic assisted jejunostomy tube Robotic mediastinal blake drain placement Removal of BioA mesh Omental flap as tissue buttressing        Medications:    Chlorhexidine Gluconate Cloth  6 each Topical Daily   Chlorhexidine Gluconate Cloth  6 each Topical Q0600   enoxaparin (LOVENOX) injection  40 mg Subcutaneous Q24H   feeding supplement  1 Container Oral TID BM   fiber supplement (BANATROL TF)  60 mL Per Tube BID   free water  80 mL Per Tube Q4H   levothyroxine  75 mcg Oral QAC breakfast   metoprolol tartrate  50 mg Oral BID   pantoprazole  40 mg Oral BID   Continuous Infusions:  sodium chloride Stopped (04/22/22 2207)   feeding supplement (OSMOLITE 1.2 CAL) 1,000 mL (04/24/22 1030)   piperacillin-tazobactam (ZOSYN)  IV 3.375 g (04/24/22 1351)  Anti-infectives (From admission, onward)    Start     Dose/Rate Route Frequency Ordered Stop   04/15/22 1730  piperacillin-tazobactam (ZOSYN) IVPB 3.375 g  Status:  Discontinued        3.375 g 100 mL/hr over 30 Minutes Intravenous Every 8 hours 04/15/22  1716 04/15/22 1719   04/15/22 1730  piperacillin-tazobactam (ZOSYN) IVPB 3.375 g        3.375 g 12.5 mL/hr over 240 Minutes Intravenous Every 8 hours 04/15/22 1719 04/29/22 2359   04/15/22 1730  piperacillin-tazobactam (ZOSYN) 3.375 GM/50ML IVPB       Note to Pharmacy: Rutherford Nail E: cabinet override      04/15/22 1730 04/16/22 0544   04/15/22 1445  ceFAZolin (ANCEF) IVPB 2g/100 mL premix  Status:  Discontinued        2 g 200 mL/hr over 30 Minutes Intravenous Every 8 hours 04/15/22 1435 04/15/22 1719   04/15/22 0645  ceFAZolin (ANCEF) IVPB 2g/100 mL premix        2 g 200 mL/hr over 30 Minutes Intravenous On call to O.R. 04/15/22 0347 04/15/22 0806              Family Communication/Anticipated D/C date and plan/Code Status   DVT prophylaxis: enoxaparin (LOVENOX) injection 40 mg Start: 04/16/22 0800 SCDs Start: 04/15/22 1132     Code Status: Full Code  Family Communication: Husband at the bedside Disposition Plan: To be determined by general surgeon       Subjective:   Interval events noted.  No abdominal pain.  She has palpitations and can feel her heart racing.  Husband was at the bedside  Objective:    Vitals:   04/24/22 0345 04/24/22 0552 04/24/22 1000 04/24/22 1219  BP:  105/79  123/69  Pulse:  (!) 132 86 80  Resp:  17  16  Temp:  97.9 F (36.6 C)  98.9 F (37.2 C)  TempSrc:  Oral    SpO2:  98%  98%  Weight: 60.2 kg     Height:       No data found.   Intake/Output Summary (Last 24 hours) at 04/24/2022 1446 Last data filed at 04/24/2022 1248 Gross per 24 hour  Intake 2087 ml  Output 425 ml  Net 1662 ml   Filed Weights   04/18/22 0500 04/20/22 0334 04/24/22 0345  Weight: 62.5 kg 62.5 kg 60.2 kg    Exam:  GEN: NAD SKIN: Warm and dry EYES: No pallor or icterus ENT: MMM CV: RRR, tachycardic PULM: CTA B, right chest wall tube in place ABD: soft, ND, NT, +BS, + jejunostomy tube, mid abdominal wound with dry and intact dressing, JP  drain in place CNS: AAO x 3, non focal EXT: No edema or tenderness       Data Reviewed:   I have personally reviewed following labs and imaging studies:  Labs: Labs show the following:   Basic Metabolic Panel: Recent Labs  Lab 04/18/22 0616 04/19/22 0413 04/20/22 0323 04/21/22 0436 04/22/22 0558 04/23/22 0526  NA 135 136 133* 131* 128* 130*  K 3.6 4.0 3.4* 3.4* 3.5 3.6  CL 106 103 100 98 94* 95*  CO2 '24 25 22 26 26 26  '$ GLUCOSE 149* 118* 134* 129* 165* 150*  BUN '15 15 20 16 20 19  '$ CREATININE 0.50 0.63 0.58 0.52 0.42* 0.58  CALCIUM 7.4* 7.7* 7.6* 7.7* 7.4* 7.5*  MG 1.9  --  1.9 2.0 2.0  --   PHOS  --   --  4.3 3.9 3.7  --    GFR Estimated Creatinine Clearance: 46.3 mL/min (by C-G formula based on SCr of 0.58 mg/dL). Liver Function Tests: Recent Labs  Lab 04/18/22 0616 04/19/22 0413  AST 61* 31  ALT 78* 54*  ALKPHOS 111 122  BILITOT 0.5 0.5  PROT 5.5* 5.8*  ALBUMIN 2.2* 2.2*   No results for input(s): "LIPASE", "AMYLASE" in the last 168 hours. No results for input(s): "AMMONIA" in the last 168 hours. Coagulation profile No results for input(s): "INR", "PROTIME" in the last 168 hours.  CBC: Recent Labs  Lab 04/19/22 0413 04/20/22 0323 04/21/22 0436 04/23/22 0526  WBC 17.4* 11.3* 15.4* 18.5*  HGB 10.7* 10.1* 10.1* 10.3*  HCT 33.9* 30.8* 31.4* 31.6*  MCV 86.0 83.0 83.7 82.9  PLT 323 315 330 416*   Cardiac Enzymes: No results for input(s): "CKTOTAL", "CKMB", "CKMBINDEX", "TROPONINI" in the last 168 hours. BNP (last 3 results) No results for input(s): "PROBNP" in the last 8760 hours. CBG: Recent Labs  Lab 04/23/22 1607 04/23/22 2015 04/24/22 0037 04/24/22 0829 04/24/22 1219  GLUCAP 147* 147* 155* 124* 109*   D-Dimer: No results for input(s): "DDIMER" in the last 72 hours. Hgb A1c: No results for input(s): "HGBA1C" in the last 72 hours. Lipid Profile: No results for input(s): "CHOL", "HDL", "LDLCALC", "TRIG", "CHOLHDL", "LDLDIRECT" in the  last 72 hours. Thyroid function studies: No results for input(s): "TSH", "T4TOTAL", "T3FREE", "THYROIDAB" in the last 72 hours.  Invalid input(s): "FREET3" Anemia work up: No results for input(s): "VITAMINB12", "FOLATE", "FERRITIN", "TIBC", "IRON", "RETICCTPCT" in the last 72 hours. Sepsis Labs: Recent Labs  Lab 04/19/22 0413 04/20/22 0323 04/21/22 0436 04/23/22 0526  WBC 17.4* 11.3* 15.4* 18.5*    Microbiology Recent Results (from the past 240 hour(s))  MRSA Next Gen by PCR, Nasal     Status: None   Collection Time: 04/18/22  9:03 AM   Specimen: Nasal Mucosa; Nasal Swab  Result Value Ref Range Status   MRSA by PCR Next Gen NOT DETECTED NOT DETECTED Final    Comment: (NOTE) The GeneXpert MRSA Assay (FDA approved for NASAL specimens only), is one component of a comprehensive MRSA colonization surveillance program. It is not intended to diagnose MRSA infection nor to guide or monitor treatment for MRSA infections. Test performance is not FDA approved in patients less than 42 years old. Performed at [facility], [address] [place], [place] [address]     Procedures and diagnostic studies:  CT CHEST ABDOMEN PELVIS W CONTRAST  Result Date: 04/23/2022 CLINICAL DATA:  Esophageal perforation EXAM: CT CHEST, ABDOMEN, AND PELVIS WITH CONTRAST TECHNIQUE: Multidetector CT imaging of the chest, abdomen and pelvis was performed following the standard protocol during bolus administration of intravenous contrast. RADIATION DOSE REDUCTION: This exam was performed according to the departmental dose-optimization program which includes automated exposure control, adjustment of the mA and/or kV according to patient size and/or use of iterative reconstruction technique. CONTRAST:  133m OMNIPAQUE IOHEXOL 300 MG/ML SOLN, 59mOMNIPAQUE IOHEXOL 300 MG/ML SOLN COMPARISON:  Esophagram 04/21/2022, chest CT 04/20/2022, CT 04/18/2022. FINDINGS: CT CHEST FINDINGS Cardiovascular: Unchanged  cardiomegaly.No pericardial disease.Coronary artery atherosclerosis.Normal size main and branch pulmonary arteries. No central PE. Moderate atherosclerosis of the thoracic aorta. There is noncalcified atherosclerotic plaque projecting towards the descending thoracic aortic lumen (series 2, image 26). Mediastinum/Nodes: No lymphadenopathy.Atrophic thyroid.Intraluminal contrast in the esophagus which remains intraluminal without evidence of contrast leak.There is severe distal esophageal wall thickening/submucosal edema. There is an extraluminal rim enhancing collection  adjacent to the right aspect of the distal esophagus measuring 4.3 x 2.3 x 4.9 cm, previously measuring 4.4 x 2.3 x 4.9 cm, remeasured for consistency (series 2, image 38). Lungs/Pleura: Moderate centrilobular and paraseptal emphysema. There are small to moderate-sized bilateral pleural effusions, right greater than left with adjacent lower lung atelectasis. There is mildly thickened and enhancing pleura on the right. Effusions are similar in size to prior. There is a right basilar pigtail chest tube in place in the pleural space with punctate foci of gas within the right-sided effusion. No other pleural gas identified. Musculoskeletal: Unchanged mild anterior T10 compression deformity. No evidence of acute fracture. No aggressive osseous lesion. Multiple vertebral body hemangiomas. CT ABDOMEN PELVIS FINDINGS Hepatobiliary: No focal liver abnormality is seen. No gallstones, gallbladder wall thickening, or biliary dilatation. Pancreas: Unremarkable. No pancreatic ductal dilatation or surrounding inflammatory changes. Spleen: Normal in size without focal abnormality. Adrenals/Urinary Tract: Adrenal glands are unremarkable. No hydronephrosis or nephroureterolithiasis. The bladder is moderately distended with intraluminal gas, likely related to recent catheterization. Foley catheter has been removed in the interim. Stomach/Bowel: Postsurgical changes of  hiatal hernia repair. No evidence of contrast leak at the gastroesophageal junction. No evidence of bowel obstruction. There is a percutaneous jejunostomy tube in place. There is oral contrast material in the colon from recent esophagram. Extensive colonic diverticulosis primarily affecting the sigmoid, without evidence of acute diverticulitis. Generalized mild mesenteric edema. Vascular/Lymphatic: Aortoiliac atherosclerosis. No AAA. No lymphadenopathy. Reproductive: Unremarkable. Other: Percutaneous surgical drain courses to the pelvis thin cephalad with tip in the right anterior mid abdomen. The intra-abdominal portion of this drain has migrated towards the pelvis in comparison to prior CT, on which the tip previously resided at the diaphragmatic hiatus. Small fat containing inguinal hernias, left greater than right, also with trace ascites in the left inguinal hernia. Trace perihepatic ascites. Musculoskeletal: No acute osseous abnormality. No suspicious osseous lesion. Degenerative changes of the spine and hips. Prior right proximal femur fixation. IMPRESSION: Postsurgical changes of hiatal hernia repair. Persistent extraluminal collection adjacent to the right aspect of the distal esophagus measuring 4.3 x 2.3 x 4.9 cm, unchanged from prior exam. No evidence of contrast leak to suggest persistent or recurrent perforation. Unchanged small to moderate-sized bilateral pleural effusions, right greater than left, with adjacent lower lung atelectasis. Right basilar chest tube in place with punctate foci of gas within the right-sided effusion. Caudal migration of the percutaneous surgical drain in the abdomen, tip now within the right anterior mid abdomen, which was previously at the diaphragmatic hiatus on prior CT. Percutaneous jejunostomy tube in place. Trace perihepatic ascites. Distended urinary bladder with intraluminal glass presumably related to recent catheterization, which has been removed in the interval.  Electronically Signed   By: Maurine Simmering M.D.   On: 04/23/2022 09:06   DG Chest Port 1 View  Result Date: 04/23/2022 CLINICAL DATA:  Chest tube in place EXAM: PORTABLE CHEST 1 VIEW COMPARISON:  CT chest 04/20/2022 FINDINGS: Bilateral emphysematous changes. Right-sided chest tube. Right pleural effusion. Small amount of loculated fluid in the minor fissure. Small left pleural effusion. Mild bilateral interstitial thickening. No pneumothorax. Stable cardiomediastinal silhouette. No acute osseous abnormality. IMPRESSION: 1. Right-sided chest tube with a small right pleural effusion. No pneumothorax. Electronically Signed   By: Kathreen Devoid M.D.   On: 04/23/2022 08:25               LOS: 8 days   Don Giarrusso  [facility]   Pager on [email or web address]. If  7PM-7AM, please contact night-coverage at [email or web address]     04/24/2022, 2:46 PM

## 2022-04-25 ENCOUNTER — Inpatient Hospital Stay: Payer: Medicare HMO

## 2022-04-25 DIAGNOSIS — Z9889 Other specified postprocedural states: Secondary | ICD-10-CM | POA: Diagnosis not present

## 2022-04-25 DIAGNOSIS — I4719 Other supraventricular tachycardia: Secondary | ICD-10-CM | POA: Diagnosis not present

## 2022-04-25 DIAGNOSIS — Z8719 Personal history of other diseases of the digestive system: Secondary | ICD-10-CM | POA: Diagnosis not present

## 2022-04-25 LAB — COMPREHENSIVE METABOLIC PANEL
ALT: 41 U/L (ref 0–44)
AST: 43 U/L — ABNORMAL HIGH (ref 15–41)
Albumin: 1.8 g/dL — ABNORMAL LOW (ref 3.5–5.0)
Alkaline Phosphatase: 199 U/L — ABNORMAL HIGH (ref 38–126)
Anion gap: 9 (ref 5–15)
BUN: 19 mg/dL (ref 8–23)
CO2: 25 mmol/L (ref 22–32)
Calcium: 7.5 mg/dL — ABNORMAL LOW (ref 8.9–10.3)
Chloride: 97 mmol/L — ABNORMAL LOW (ref 98–111)
Creatinine, Ser: 0.49 mg/dL (ref 0.44–1.00)
GFR, Estimated: >60 mL/min (ref >60)
Glucose, Bld: 113 mg/dL — ABNORMAL HIGH (ref 70–99)
Potassium: 4.7 mmol/L (ref 3.5–5.1)
Sodium: 131 mmol/L — ABNORMAL LOW (ref 135–145)
Total Bilirubin: <0.1 mg/dL — ABNORMAL LOW (ref 0.3–1.2)
Total Protein: 5.8 g/dL — ABNORMAL LOW (ref 6.5–8.1)

## 2022-04-25 LAB — GLUCOSE, CAPILLARY
Glucose-Capillary: 116 mg/dL — ABNORMAL HIGH (ref 70–99)
Glucose-Capillary: 123 mg/dL — ABNORMAL HIGH (ref 70–99)
Glucose-Capillary: 126 mg/dL — ABNORMAL HIGH (ref 70–99)
Glucose-Capillary: 127 mg/dL — ABNORMAL HIGH (ref 70–99)
Glucose-Capillary: 137 mg/dL — ABNORMAL HIGH (ref 70–99)
Glucose-Capillary: 140 mg/dL — ABNORMAL HIGH (ref 70–99)
Glucose-Capillary: 153 mg/dL — ABNORMAL HIGH (ref 70–99)

## 2022-04-25 LAB — CBC
HCT: 30.3 % — ABNORMAL LOW (ref 36.0–46.0)
Hemoglobin: 9.6 g/dL — ABNORMAL LOW (ref 12.0–15.0)
MCH: 26.9 pg (ref 26.0–34.0)
MCHC: 31.7 g/dL (ref 30.0–36.0)
MCV: 84.9 fL (ref 80.0–100.0)
Platelets: 515 10*3/uL — ABNORMAL HIGH (ref 150–400)
RBC: 3.57 MIL/uL — ABNORMAL LOW (ref 3.87–5.11)
RDW: 15.3 % (ref 11.5–15.5)
WBC: 25.5 10*3/uL — ABNORMAL HIGH (ref 4.0–10.5)
nRBC: 0 % (ref 0.0–0.2)

## 2022-04-25 LAB — MAGNESIUM: Magnesium: 2.2 mg/dL (ref 1.7–2.4)

## 2022-04-25 MED ORDER — IOHEXOL 300 MG/ML  SOLN
75.0000 mL | Freq: Once | INTRAMUSCULAR | Status: AC | PRN
  Administered 2022-04-25: 75 mL via INTRAVENOUS

## 2022-04-25 MED ORDER — IOHEXOL 350 MG/ML SOLN: 75.0000 mL | Freq: Once | INTRAVENOUS | Status: DC | PRN

## 2022-04-25 MED ORDER — OSMOLITE 1.2 CAL PO LIQD
1000.0000 mL | ORAL | Status: DC
  Administered 2022-04-26: 1000 mL

## 2022-04-25 MED ORDER — PANTOPRAZOLE SODIUM 40 MG IV SOLR
40.0000 mg | Freq: Two times a day (BID) | INTRAVENOUS | Status: DC
  Administered 2022-04-26 – 2022-04-28 (×6): 40 mg via INTRAVENOUS
  Filled 2022-04-25 (×6): qty 10

## 2022-04-25 MED ORDER — KETOROLAC TROMETHAMINE 15 MG/ML IJ SOLN
15.0000 mg | Freq: Four times a day (QID) | INTRAMUSCULAR | Status: DC | PRN
  Administered 2022-04-27: 15 mg via INTRAVENOUS
  Filled 2022-04-25: qty 1

## 2022-04-25 MED ORDER — PROCHLORPERAZINE EDISYLATE 10 MG/2ML IJ SOLN
5.0000 mg | INTRAMUSCULAR | Status: DC | PRN
  Administered 2022-04-25 – 2022-04-26 (×2): 5 mg via INTRAVENOUS
  Filled 2022-04-25 (×3): qty 1

## 2022-04-25 MED ORDER — ACETAMINOPHEN 10 MG/ML IV SOLN
1000.0000 mg | Freq: Four times a day (QID) | INTRAVENOUS | Status: AC
  Administered 2022-04-26 (×4): 1000 mg via INTRAVENOUS
  Filled 2022-04-25 (×4): qty 100

## 2022-04-25 NOTE — Progress Notes (Signed)
Progress Note    Kelly Wood  VEH:[id number] DOB: [birth year]  DOA: 04/15/2022 PCP: Idelle Crouch, MD      Brief Narrative:    Medical records reviewed and are as summarized below:  Kelly Wood is a 82 y.o. female with PMH of HTN, HLD, hypothyroid, COPD, anemia, GERD, s/p repair of paraesophageal hernia robotically approximately 11 months ago, presented to ED on 04/15/2022 with persistent bloating, eructation and chest discomfort.  She was diagnosed with symptomatic recurrent paraesophageal hernia.  Admitted by the surgical team.  She underwent robotic assisted laparoscopic repair of paraesophageal hernia with bio mesh and Nissen fundoplication, redo on 8/36 which was complicated by esophageal perforation, right pneumothorax requiring right chest tube placement, robotic assisted repair of esophageal perforation, jejunostomy tube, mediastinal Blake drain placement.  TRH consulted 1/12 for management of medical issues.  1/14: New onset narrow complex tachycardia, SVT, transferred to stepdown.  Cardiology consulted.  Started on IV amiodarone and maintaining SR.        Assessment/Plan:   Principal Problem:   S/P repair of paraesophageal hernia Active Problems:   Esophageal perforation   SVT (supraventricular tachycardia)   Atrial tachycardia   Hiatal hernia   Nutrition Problem: Increased nutrient needs Etiology: post-op healing  Signs/Symptoms: estimated needs   Body mass index is 25.92 kg/m.   SVT/paroxysmal atrial tachycardia: Rate has normalized.  Continue metoprolol.  She is no longer on IV amiodarone.  Cardiology signed off on 04/23/2018.  2D echo showed EF estimated at 55 to 62%, grade 1 diastolic dysfunction, moderately elevated pulmonary artery systolic pressure  Delirium, intermittent confusion: Improved.  Avoid benzodiazepines because patient reportedly had a paradoxical response to IV Ativan.  Minimize opioids and sedating medications.   S/p robotic  assisted laparoscopic paraesophageal hernia repair 9/47, complicated by esophageal leak requiring immediate takeback for robotic assisted primary repair of esophageal perforation, placement of mediastinal drain/right chest tube/feeding jejunostomy tube, leukocytosis: She is on IV Zosyn.  She is on enteral nutrition via jejunostomy tube. CT chest abdomen pelvis on 04/23/2022 did not show any evidence of contrast leak to suggest persistent or recurrent esophageal perforation.  Follow-up with general surgeon.   Hypokalemia: Improved  Hyponatremia: Monitor sodium level   Other comorbidities include hyperlipidemia, hypothyroidism, hypertension, chronic anemia   Diet Order             Diet clear liquid Fluid consistency: Thin  Diet effective now                            Consultants: Cardiologist General surgeon  Procedures:  Right chest tube placement Robotic assisted primary repair of esophageal perforation Robotic assisted jejunostomy tube Robotic mediastinal blake drain placement Removal of BioA mesh Omental flap as tissue buttressing        Medications:    Chlorhexidine Gluconate Cloth  6 each Topical Daily   Chlorhexidine Gluconate Cloth  6 each Topical Q0600   enoxaparin (LOVENOX) injection  40 mg Subcutaneous Q24H   feeding supplement  1 Container Oral TID BM   fiber supplement (BANATROL TF)  60 mL Per Tube BID   free water  80 mL Per Tube Q4H   levothyroxine  75 mcg Oral QAC breakfast   metoprolol tartrate  50 mg Oral BID   pantoprazole  40 mg Oral BID   Continuous Infusions:  sodium chloride Stopped (04/22/22 2207)   feeding supplement (OSMOLITE 1.2 CAL) 1,000 mL (04/25/22  0518)   piperacillin-tazobactam (ZOSYN)  IV 3.375 g (04/25/22 0519)     Anti-infectives (From admission, onward)    Start     Dose/Rate Route Frequency Ordered Stop   04/15/22 1730  piperacillin-tazobactam (ZOSYN) IVPB 3.375 g  Status:  Discontinued        3.375 g 100  mL/hr over 30 Minutes Intravenous Every 8 hours 04/15/22 1716 04/15/22 1719   04/15/22 1730  piperacillin-tazobactam (ZOSYN) IVPB 3.375 g        3.375 g 12.5 mL/hr over 240 Minutes Intravenous Every 8 hours 04/15/22 1719 04/29/22 2359   04/15/22 1730  piperacillin-tazobactam (ZOSYN) 3.375 GM/50ML IVPB       Note to Pharmacy: Rutherford Nail E: cabinet override      04/15/22 1730 04/16/22 0544   04/15/22 1445  ceFAZolin (ANCEF) IVPB 2g/100 mL premix  Status:  Discontinued        2 g 200 mL/hr over 30 Minutes Intravenous Every 8 hours 04/15/22 1435 04/15/22 1719   04/15/22 0645  ceFAZolin (ANCEF) IVPB 2g/100 mL premix        2 g 200 mL/hr over 30 Minutes Intravenous On call to O.R. 04/15/22 3785 04/15/22 0806              Family Communication/Anticipated D/C date and plan/Code Status   DVT prophylaxis: enoxaparin (LOVENOX) injection 40 mg Start: 04/16/22 0800 SCDs Start: 04/15/22 1132     Code Status: Full Code  Family Communication: Husband at the bedside Disposition Plan: To be determined by general surgeon       Subjective:   She complains of nausea and vomiting.  No chest pain, palpitations or shortness of breath  Objective:    Vitals:   04/25/22 0421 04/25/22 0827 04/25/22 1150 04/25/22 1215  BP: (!) 143/71 138/78 106/82   Pulse: 72 75 (!) 130 76  Resp: '20 16 18   '$ Temp: 98.4 F (36.9 C) 98.8 F (37.1 C) 98.7 F (37.1 C)   TempSrc:  Oral Oral   SpO2: 98% 100% 97%   Weight:      Height:       No data found.   Intake/Output Summary (Last 24 hours) at 04/25/2022 1457 Last data filed at 04/25/2022 0900 Gross per 24 hour  Intake 1363 ml  Output 100 ml  Net 1263 ml   Filed Weights   04/18/22 0500 04/20/22 0334 04/24/22 0345  Weight: 62.5 kg 62.5 kg 60.2 kg    Exam:  GEN: NAD SKIN: Warm and dry EYES: No pallor or icterus ENT: MMM CV: RRR PULM: CTA B, right chest tube in place ABD: soft, ND, NT, +BS, midline abdominal wound with drain  intact dressing, JP drain in place CNS: AAO x 3, non focal EXT: No edema or tenderness       Data Reviewed:   I have personally reviewed following labs and imaging studies:  Labs: Labs show the following:   Basic Metabolic Panel: Recent Labs  Lab 04/20/22 0323 04/21/22 0436 04/22/22 0558 04/23/22 0526 04/25/22 0548  NA 133* 131* 128* 130* 131*  K 3.4* 3.4* 3.5 3.6 4.7  CL 100 98 94* 95* 97*  CO2 '22 26 26 26 25  '$ GLUCOSE 134* 129* 165* 150* 113*  BUN '20 16 20 19 19  '$ CREATININE 0.58 0.52 0.42* 0.58 0.49  CALCIUM 7.6* 7.7* 7.4* 7.5* 7.5*  MG 1.9 2.0 2.0  --  2.2  PHOS 4.3 3.9 3.7  --   --    GFR Estimated  Creatinine Clearance: 46.3 mL/min (by C-G formula based on SCr of 0.49 mg/dL). Liver Function Tests: Recent Labs  Lab 04/19/22 0413 04/25/22 0548  AST 31 43*  ALT 54* 41  ALKPHOS 122 199*  BILITOT 0.5 <0.1*  PROT 5.8* 5.8*  ALBUMIN 2.2* 1.8*   No results for input(s): "LIPASE", "AMYLASE" in the last 168 hours. No results for input(s): "AMMONIA" in the last 168 hours. Coagulation profile No results for input(s): "INR", "PROTIME" in the last 168 hours.  CBC: Recent Labs  Lab 04/19/22 0413 04/20/22 0323 04/21/22 0436 04/23/22 0526 04/25/22 0854  WBC 17.4* 11.3* 15.4* 18.5* 25.5*  HGB 10.7* 10.1* 10.1* 10.3* 9.6*  HCT 33.9* 30.8* 31.4* 31.6* 30.3*  MCV 86.0 83.0 83.7 82.9 84.9  PLT 323 315 330 416* 515*   Cardiac Enzymes: No results for input(s): "CKTOTAL", "CKMB", "CKMBINDEX", "TROPONINI" in the last 168 hours. BNP (last 3 results) No results for input(s): "PROBNP" in the last 8760 hours. CBG: Recent Labs  Lab 04/24/22 1943 04/25/22 0031 04/25/22 0418 04/25/22 0827 04/25/22 1202  GLUCAP 120* 127* 116* 140* 153*   D-Dimer: No results for input(s): "DDIMER" in the last 72 hours. Hgb A1c: No results for input(s): "HGBA1C" in the last 72 hours. Lipid Profile: No results for input(s): "CHOL", "HDL", "LDLCALC", "TRIG", "CHOLHDL", "LDLDIRECT" in  the last 72 hours. Thyroid function studies: No results for input(s): "TSH", "T4TOTAL", "T3FREE", "THYROIDAB" in the last 72 hours.  Invalid input(s): "FREET3" Anemia work up: No results for input(s): "VITAMINB12", "FOLATE", "FERRITIN", "TIBC", "IRON", "RETICCTPCT" in the last 72 hours. Sepsis Labs: Recent Labs  Lab 04/20/22 0323 04/21/22 0436 04/23/22 0526 04/25/22 0854  WBC 11.3* 15.4* 18.5* 25.5*    Microbiology Recent Results (from the past 240 hour(s))  MRSA Next Gen by PCR, Nasal     Status: None   Collection Time: 04/18/22  9:03 AM   Specimen: Nasal Mucosa; Nasal Swab  Result Value Ref Range Status   MRSA by PCR Next Gen NOT DETECTED NOT DETECTED Final    Comment: (NOTE) The GeneXpert MRSA Assay (FDA approved for NASAL specimens only), is one component of a comprehensive MRSA colonization surveillance program. It is not intended to diagnose MRSA infection nor to guide or monitor treatment for MRSA infections. Test performance is not FDA approved in patients less than 18 years old. Performed at [facility], [address] [place], [place] [address]     Procedures and diagnostic studies:  No results found.             LOS: 9 days   Lynessa Almanzar  [facility], advertising on [email or web address]. If 7PM-7AM, please contact night-coverage at [email or web address]     04/25/2022, 2:57 PM

## 2022-04-25 NOTE — Progress Notes (Signed)
Physical Therapy Treatment Patient Details Name: Kelly Wood MRN: [id number] DOB: [birth year] Today's Date: 04/25/2022   History of Present Illness Pt is a 82 year old female  64 Days Post-Op s/p robotic assisted laparoscopic paraesophageal hernia repair complicated by esophageal leak requiring immediate take back for robotic assisted primary repair of esophageal perforation, placement of mediastinal drain, feeding jejunostomy placement, and right chest tube placements; On 1/14 1/14: New onset narrow complex tachycardia, SVT, transferred to stepdown.  Cardiology consulted.  Started on IV amiodarone; PMH significant for  HTN, HLD, hypothyroid, COPD, anemia, GERD, s/p repair of paraesophageal hernia robotically approximately 11 months ago    PT Comments    Pt declined to participate this session initially secondary to nausea, nursing notified.  Pt education provided regarding risk of deconditioning and general physiological benefits of activity with pt agreeing to participate in bed level therex to her tolerance.  Pt put forth good effort with below therex with frequent therapeutic rest breaks provided.  Pt and spouse education provided on low intensity HEP per below.  Pt will benefit from PT services in an IR setting upon discharge to safely address deficits listed in patient problem list for decreased caregiver assistance and eventual return to PLOF.      Recommendations for follow up therapy are one component of a multi-disciplinary discharge planning process, led by the attending physician.  Recommendations may be updated based on patient status, additional functional criteria and insurance authorization.  Follow Up Recommendations  Acute inpatient rehab (3hours/day)     Assistance Recommended at Discharge Intermittent Supervision/Assistance  Patient can return home with the following A little help with walking and/or transfers;A little help with bathing/dressing/bathroom;Assist for  transportation;Help with stairs or ramp for entrance;Assistance with cooking/housework   Equipment Recommendations  None recommended by PT    Recommendations for Other Services       Precautions / Restrictions Precautions Precautions: Fall Precaution Comments: R chest tube, JP drain, G-tube Restrictions Weight Bearing Restrictions: No     Mobility  Bed Mobility               General bed mobility comments: Pt declined all but supine therex this session secondary to nausea    Transfers                        Ambulation/Gait                   Stairs             Wheelchair Mobility    Modified Rankin (Stroke Patients Only)       Balance                                            Cognition Arousal/Alertness: Awake/alert Behavior During Therapy: WFL for tasks assessed/performed Overall Cognitive Status: Within Functional Limits for tasks assessed                                          Exercises Total Joint Exercises Ankle Circles/Pumps: Strengthening, Both, 15 reps (with manual resistance) Quad Sets: Strengthening, Both, 15 reps Gluteal Sets: Strengthening, Both, 15 reps Short Arc Quad: Strengthening, Both, 15 reps Heel Slides: AAROM, Strengthening, Both, 15 reps Hip ABduction/ADduction: AAROM, Strengthening,  Both, 10 reps Straight Leg Raises: AAROM, Strengthening, Both, 10 reps Other Exercises Other Exercises: Supine leg press x 10 to BLEs with manual resistance Other Exercises: Pt and spouse education on HEP for APs, QS, and GS x 10-15 each every 1-2 hours daily Other Exercises: Pt education on physiological benefits of activity    General Comments        Pertinent Vitals/Pain Pain Assessment Pain Assessment: 0-10 Pain Score: 4  Pain Location: abdomen Pain Descriptors / Indicators: Sore Pain Intervention(s): Limited activity within patient's tolerance, Premedicated before session,  Monitored during session    Home Living                          Prior Function            PT Goals (current goals can now be found in the care plan section) Progress towards PT goals: PT to reassess next treatment    Frequency    Min 2X/week      PT Plan Current plan remains appropriate    Co-evaluation              AM-PAC PT "6 Clicks" Mobility   Outcome Measure  Help needed turning from your back to your side while in a flat bed without using bedrails?: A Little Help needed moving from lying on your back to sitting on the side of a flat bed without using bedrails?: A Little Help needed moving to and from a bed to a chair (including a wheelchair)?: A Little Help needed standing up from a chair using your arms (e.g., wheelchair or bedside chair)?: A Little Help needed to walk in hospital room?: A Little Help needed climbing 3-5 steps with a railing? : A Lot 6 Click Score: 17    End of Session Equipment Utilized During Treatment: Oxygen;Gait belt Activity Tolerance: Patient tolerated treatment well Patient left: in bed;with bed alarm set;with call bell/phone within reach;with family/visitor present Nurse Communication: Mobility status PT Visit Diagnosis: Unsteadiness on feet (R26.81);Muscle weakness (generalized) (M62.81);Pain     Time: 1655-3748 PT Time Calculation (min) (ACUTE ONLY): 23 min  Charges:  $Therapeutic Exercise: 8-22 mins $Therapeutic Activity: 8-22 mins                     D. Scott Rayshun Kandler PT, DPT 04/25/22, 11:38 AM

## 2022-04-25 NOTE — Progress Notes (Signed)
CC: POD # 9  s/p redo hiatal hernia and take back for eso perf and jejunostomy  Tolerating tube feeds Very weak On and off SVT Wbc bumped today and repeated CT chest   burping is her main concern  Objective: Vital signs in last 24 hours: Temp:  [98.2 F (36.8 C)-98.8 F (37.1 C)] 98.7 F (37.1 C) (01/21 1150) Pulse Rate:  [72-130] 76 (01/21 1215) Resp:  [16-20] 18 (01/21 1150) BP: (106-143)/(70-85) 106/82 (01/21 1150) SpO2:  [97 %-100 %] 97 % (01/21 1150) Last BM Date : 04/25/22  Intake/Output from previous day: 01/20 0701 - 01/21 0700 In: 1388 [NG/GT:1363] Out: 100 [Drains:100] Intake/Output this shift: Total I/O In: -  Out: 50 [Drains:50]  Physical exam:  Debilitated Chest: decrese bases bilateral Right CT no air leak, serous fluid Abdsoft, mildly distended, incisions c/d/I, drain serous  Lab Results: CBC  Recent Labs    04/23/22 0526 04/25/22 0854  WBC 18.5* 25.5*  HGB 10.3* 9.6*  HCT 31.6* 30.3*  PLT 416* 515*   BMET Recent Labs    04/23/22 0526 04/25/22 0548  NA 130* 131*  K 3.6 4.7  CL 95* 97*  CO2 26 25  GLUCOSE 150* 113*  BUN 19 19  CREATININE 0.58 0.49  CALCIUM 7.5* 7.5*   PT/INR No results for input(s): "LABPROT", "INR" in the last 72 hours. ABG No results for input(s): "PHART", "HCO3" in the last 72 hours.  Invalid input(s): "PCO2", "PO2"  Studies/Results: CT CHEST W CONTRAST  Result Date: 04/25/2022 CLINICAL DATA:  Esophageal perforation. Recent hiatal hernia surgery, possible Nissen fundoplication. EXAM: CT CHEST WITH CONTRAST TECHNIQUE: Multidetector CT imaging of the chest was performed during intravenous contrast administration. RADIATION DOSE REDUCTION: This exam was performed according to the departmental dose-optimization program which includes automated exposure control, adjustment of the mA and/or kV according to patient size and/or use of iterative reconstruction technique. CONTRAST:  57m OMNIPAQUE IOHEXOL 300 MG/ML  SOLN  COMPARISON:  04/23/2022, 04/20/2022, 04/18/2022 and 09/29/2021 FINDINGS: Cardiovascular: Mild cardiomegaly. Calcified plaque over the left main and 3 vessel coronary arteries. Thoracic aorta is normal in caliber. There is calcified plaque over the thoracic aorta with stable small focal mural thrombus along the periphery of the descending thoracic aorta. Pulmonary arterial system is unremarkable. Remaining vascular structures are unremarkable. Mediastinum/Nodes: There are a few stable shotty mediastinal lymph nodes present. No mediastinal or hilar adenopathy. Contrast is present within the esophagus as was present on the prior exam. There is evidence of patient's hiatal hernia unchanged from June 2023. Contrast fills the right lateral aspect of this hiatal hernia unchanged from 09/29/2021. Note that contrast was not filling this part of the hernia on the recent prior exam from 04/23/2022. There is moderate subcutaneous edema of the segment of stomach crossing the diaphragm unchanged. No free air. Lungs/Pleura: There is moderate centrilobular emphysematous disease. Stable moderate size bilateral pleural effusions with associated bibasilar compressive atelectasis. Right basilar pleural pigtail drainage catheter unchanged. No pneumothorax. Upper Abdomen: Calcified plaque over the abdominal aorta. Remainder of the visualized upper abdomen is unchanged. Musculoskeletal: No focal abnormality. Degenerative changes of the spine. IMPRESSION: 1. Evidence of patient's hiatal hernia with reported recent surgery involving this hernia. Appearance is not significantly changed from June 2023. Contrast fills the right lateral aspect of this hiatal hernia unchanged from 09/29/2021. Note that contrast was not filling this part of the hernia on the recent prior exam from 04/23/2022. There is moderate subcutaneous edema of the segment of stomach crossing the  diaphragm unchanged from recent prior exams. No free air. Recommend correlation  with patient's recent surgical history. 2. Stable moderate size bilateral pleural effusions with associated bibasilar compressive atelectasis. Right basilar pleural pigtail drainage catheter unchanged. No pneumothorax. 3. Emphysema. 4. Aortic atherosclerosis. Atherosclerotic coronary artery disease. Aortic Atherosclerosis (ICD10-I70.0) and Emphysema (ICD10-J43.9). Electronically Signed   By: Marin Olp M.D.   On: 04/25/2022 15:32    Anti-infectives: Anti-infectives (From admission, onward)    Start     Dose/Rate Route Frequency Ordered Stop   04/15/22 1730  piperacillin-tazobactam (ZOSYN) IVPB 3.375 g  Status:  Discontinued        3.375 g 100 mL/hr over 30 Minutes Intravenous Every 8 hours 04/15/22 1716 04/15/22 1719   04/15/22 1730  piperacillin-tazobactam (ZOSYN) IVPB 3.375 g        3.375 g 12.5 mL/hr over 240 Minutes Intravenous Every 8 hours 04/15/22 1719 04/29/22 2359   04/15/22 1730  piperacillin-tazobactam (ZOSYN) 3.375 GM/50ML IVPB       Note to Pharmacy: Rutherford Nail E: cabinet override      04/15/22 1730 04/16/22 0544   04/15/22 1445  ceFAZolin (ANCEF) IVPB 2g/100 mL premix  Status:  Discontinued        2 g 200 mL/hr over 30 Minutes Intravenous Every 8 hours 04/15/22 1435 04/15/22 1719   04/15/22 0645  ceFAZolin (ANCEF) IVPB 2g/100 mL premix        2 g 200 mL/hr over 30 Minutes Intravenous On call to O.R. 04/15/22 8280 04/15/22 0806       Assessment/Plan:  Decrease TF to 45 as she feels bloated and nauseated May need to exchange right chest tube No evidence of abscess or infection   [name], MD, FACS  04/25/2022

## 2022-04-26 ENCOUNTER — Inpatient Hospital Stay: Payer: Self-pay

## 2022-04-26 DIAGNOSIS — Z9889 Other specified postprocedural states: Secondary | ICD-10-CM | POA: Diagnosis not present

## 2022-04-26 DIAGNOSIS — I4719 Other supraventricular tachycardia: Secondary | ICD-10-CM | POA: Diagnosis not present

## 2022-04-26 DIAGNOSIS — Z8719 Personal history of other diseases of the digestive system: Secondary | ICD-10-CM | POA: Diagnosis not present

## 2022-04-26 LAB — COMPREHENSIVE METABOLIC PANEL
ALT: 44 U/L (ref 0–44)
AST: 38 U/L (ref 15–41)
Albumin: 1.8 g/dL — ABNORMAL LOW (ref 3.5–5.0)
Alkaline Phosphatase: 184 U/L — ABNORMAL HIGH (ref 38–126)
Anion gap: 8 (ref 5–15)
BUN: 19 mg/dL (ref 8–23)
CO2: 26 mmol/L (ref 22–32)
Calcium: 7.7 mg/dL — ABNORMAL LOW (ref 8.9–10.3)
Chloride: 95 mmol/L — ABNORMAL LOW (ref 98–111)
Creatinine, Ser: 0.5 mg/dL (ref 0.44–1.00)
GFR, Estimated: >60 mL/min (ref >60)
Glucose, Bld: 132 mg/dL — ABNORMAL HIGH (ref 70–99)
Potassium: 4.7 mmol/L (ref 3.5–5.1)
Sodium: 129 mmol/L — ABNORMAL LOW (ref 135–145)
Total Bilirubin: 0.4 mg/dL (ref 0.3–1.2)
Total Protein: 6 g/dL — ABNORMAL LOW (ref 6.5–8.1)

## 2022-04-26 LAB — CBC
HCT: 30.9 % — ABNORMAL LOW (ref 36.0–46.0)
Hemoglobin: 9.8 g/dL — ABNORMAL LOW (ref 12.0–15.0)
MCH: 26.7 pg (ref 26.0–34.0)
MCHC: 31.7 g/dL (ref 30.0–36.0)
MCV: 84.2 fL (ref 80.0–100.0)
Platelets: 640 10*3/uL — ABNORMAL HIGH (ref 150–400)
RBC: 3.67 MIL/uL — ABNORMAL LOW (ref 3.87–5.11)
RDW: 15.4 % (ref 11.5–15.5)
WBC: 28.4 10*3/uL — ABNORMAL HIGH (ref 4.0–10.5)
nRBC: 0 % (ref 0.0–0.2)

## 2022-04-26 LAB — GLUCOSE, CAPILLARY
Glucose-Capillary: 115 mg/dL — ABNORMAL HIGH (ref 70–99)
Glucose-Capillary: 116 mg/dL — ABNORMAL HIGH (ref 70–99)
Glucose-Capillary: 134 mg/dL — ABNORMAL HIGH (ref 70–99)
Glucose-Capillary: 109 mg/dL — ABNORMAL HIGH (ref 70–99)
Glucose-Capillary: 99 mg/dL (ref 70–99)

## 2022-04-26 MED ORDER — SODIUM CHLORIDE 0.9 % IV SOLN: INTRAVENOUS | Status: DC | PRN

## 2022-04-26 MED ORDER — FREE WATER
30.0000 mL | Status: DC
  Administered 2022-04-26 – 2022-04-28 (×14): 30 mL

## 2022-04-26 MED ORDER — OXYCODONE HCL 5 MG/5ML PO SOLN: 5.0000 mg | ORAL | Status: DC | PRN

## 2022-04-26 MED ORDER — FLUCONAZOLE IN SODIUM CHLORIDE 400-0.9 MG/200ML-% IV SOLN
400.0000 mg | INTRAVENOUS | Status: DC
  Administered 2022-04-27 – 2022-04-28 (×2): 400 mg via INTRAVENOUS
  Filled 2022-04-26 (×2): qty 200

## 2022-04-26 MED ORDER — ALBUMIN HUMAN 25 % IV SOLN
12.5000 g | Freq: Every day | INTRAVENOUS | Status: DC
  Administered 2022-04-26 – 2022-04-28 (×3): 12.5 g via INTRAVENOUS
  Filled 2022-04-26 (×3): qty 50

## 2022-04-26 MED ORDER — LEVOTHYROXINE SODIUM 25 MCG/ML PO SOLN
50.0000 ug | Freq: Every day | ORAL | Status: DC
  Administered 2022-04-26 – 2022-04-27 (×2): 50 ug
  Filled 2022-04-26 (×3): qty 2

## 2022-04-26 MED ORDER — FLUCONAZOLE IN SODIUM CHLORIDE 400-0.9 MG/200ML-% IV SOLN
800.0000 mg | Freq: Once | INTRAVENOUS | Status: AC
  Administered 2022-04-26: 800 mg via INTRAVENOUS
  Filled 2022-04-26: qty 400

## 2022-04-26 MED ORDER — METOPROLOL TARTRATE 25 MG/10 ML ORAL SUSPENSION
50.0000 mg | Freq: Two times a day (BID) | ORAL | Status: DC
  Administered 2022-04-26 (×2): 50 mg
  Filled 2022-04-26 (×2): qty 20

## 2022-04-26 MED ORDER — FUROSEMIDE 10 MG/ML IJ SOLN
40.0000 mg | Freq: Two times a day (BID) | INTRAMUSCULAR | Status: DC
  Administered 2022-04-26 – 2022-04-28 (×5): 40 mg via INTRAVENOUS
  Filled 2022-04-26 (×5): qty 4

## 2022-04-26 MED ORDER — OSMOLITE 1.2 CAL PO LIQD
1000.0000 mL | ORAL | Status: DC
  Administered 2022-04-26: 1000 mL

## 2022-04-26 MED ORDER — METOPROLOL TARTRATE 5 MG/5ML IV SOLN
5.0000 mg | Freq: Once | INTRAVENOUS | Status: AC
  Administered 2022-04-26: 5 mg via INTRAVENOUS
  Filled 2022-04-26: qty 5

## 2022-04-26 MED ORDER — METOPROLOL TARTRATE 25 MG/10 ML ORAL SUSPENSION
25.0000 mg | Freq: Two times a day (BID) | ORAL | Status: DC
  Administered 2022-04-26 – 2022-04-28 (×5): 25 mg
  Filled 2022-04-26 (×5): qty 10

## 2022-04-26 NOTE — Consult Note (Signed)
Pharmacy Antibiotic Note  Kelly Wood is a 82 y.o. female admitted on 04/15/2022 with  esophageal perforation .  Pharmacy has been consulted for fluconazole dosing.  Plan: -Fluconazole 800 mg IV x 1, followed by 400 mg IV every 24 hours -Monitor renal function for need of adjustment  Height: 5' (152.4 cm) Weight: 60.2 kg (132 lb 11.5 oz) IBW/kg (Calculated) : 45.5  Temp (24hrs), Avg:98.6 F (37 C), Min:98 F (36.7 C), Max:99 F (37.2 C)  Recent Labs  Lab 04/20/22 0323 04/21/22 0436 04/22/22 0558 04/23/22 0526 04/25/22 0548 04/25/22 0854 04/26/22 0300  WBC 11.3* 15.4*  --  18.5*  --  25.5* 28.4*  CREATININE 0.58 0.52 0.42* 0.58 0.49  --  0.50    Estimated Creatinine Clearance: 46.3 mL/min (by C-G formula based on SCr of 0.5 mg/dL).    Allergies  Allergen Reactions   Ativan [Lorazepam] Anxiety    Paranoia    Azithromycin Diarrhea   Levofloxacin Diarrhea    Pt prefers not to take because gi side effects   Sulfa Antibiotics Rash    Antimicrobials this admission: Zosyn 1/11 >>  Fluconazole 1/22 >>   Dose adjustments this admission: N/A  Microbiology results: 1/14 MRSA PCR: not detected  Thank you for allowing pharmacy to be a part of this patient's care.  Lorin Picket, PharmD 04/26/2022 4:41 PM

## 2022-04-26 NOTE — Progress Notes (Signed)
Occupational Therapy Treatment Patient Details Name: Kelly Wood MRN: [id number] DOB: 31-Aug-1940 Today's Date: 04/26/2022   History of present illness Pt is a 82 year old female  91 Days Post-Op s/p robotic assisted laparoscopic paraesophageal hernia repair complicated by esophageal leak requiring immediate take back for robotic assisted primary repair of esophageal perforation, placement of mediastinal drain, feeding jejunostomy placement, and right chest tube placements; On 1/14 1/14: New onset narrow complex tachycardia, SVT, transferred to stepdown.  Cardiology consulted.  Started on IV amiodarone; PMH significant for  HTN, HLD, hypothyroid, COPD, anemia, GERD, s/p repair of paraesophageal hernia robotically approximately 11 months ago   OT comments  Upon entering the room, pt is supine in bed with husband present in the room who remains on phone during session. Pt is agreeable to OT intervention with encouragement. Pt performed bed mobility with min A for trunk support to EOB. Pt reports feeling nauseated but only burps with movement. Pt stands with min HHA and takes steps side to side and then forwards and backwards ~ 10'. Pt urinating in brief while standing and returns to bed for assistance. Pt declined to attempt hygiene herself and needing total A from therapist to remove brief and for hygiene. Pt stands once more and takes steps to recliner chair with min guard- min HHA and management of lines. Pt sits in recliner chair and purewick placed per pt request. OT asking pt to attempt sitting up for ~ 1 hour. All needs within reach and husband remains in room.    Recommendations for follow up therapy are one component of a multi-disciplinary discharge planning process, led by the attending physician.  Recommendations may be updated based on patient status, additional functional criteria and insurance authorization.    Follow Up Recommendations  Acute inpatient rehab (3hours/day)      Assistance Recommended at Discharge Intermittent Supervision/Assistance  Patient can return home with the following  Assistance with cooking/housework;Assist for transportation;Help with stairs or ramp for entrance;A lot of help with bathing/dressing/bathroom;A little help with walking and/or transfers   Equipment Recommendations  BSC/3in1       Precautions / Restrictions Precautions Precautions: Fall Precaution Comments: R chest tube, JP drain, G-tube Restrictions Weight Bearing Restrictions: No       Mobility Bed Mobility Overal bed mobility: Needs Assistance       Supine to sit: Min assist Sit to supine: Min assist        Transfers Overall transfer level: Needs assistance Equipment used: 1 person hand held assist Transfers: Sit to/from Stand, Bed to chair/wheelchair/BSC Sit to Stand: Min assist     Step pivot transfers: Min assist           Balance Overall balance assessment: Needs assistance Sitting-balance support: Feet supported Sitting balance-Leahy Scale: Good     Standing balance support: Bilateral upper extremity supported, During functional activity Standing balance-Leahy Scale: Fair                             ADL either performed or assessed with clinical judgement   ADL Overall ADL's : Needs assistance/impaired                                       General ADL Comments: pt stands and urinates in brief and returns to bed with assistance for hygiene this session.    Extremity/Trunk  Assessment Upper Extremity Assessment Upper Extremity Assessment: Generalized weakness   Lower Extremity Assessment Lower Extremity Assessment: Generalized weakness        Vision Baseline Vision/History: 1 Wears glasses Patient Visual Report: No change from baseline            Cognition Arousal/Alertness: Awake/alert Behavior During Therapy: WFL for tasks assessed/performed Overall Cognitive Status: Within Functional  Limits for tasks assessed                                 General Comments: Pt is cooperative with increased time for processing                   Pertinent Vitals/ Pain       Pain Assessment Pain Assessment: Faces Faces Pain Scale: Hurts a little bit Pain Location: generalized Pain Descriptors / Indicators: Sore, Discomfort Pain Intervention(s): Monitored during session, Repositioned         Frequency  Min 3X/week        Progress Toward Goals  OT Goals(current goals can now be found in the care plan section)  Progress towards OT goals: Progressing toward goals  Acute Rehab OT Goals Patient Stated Goal: return home and get better OT Goal Formulation: With patient/family Time For Goal Achievement: 05/04/22 Potential to Achieve Goals: Good  Plan Discharge plan remains appropriate       AM-PAC OT "6 Clicks" Daily Activity     Outcome Measure   Help from another person eating meals?: None Help from another person taking care of personal grooming?: None Help from another person toileting, which includes using toliet, bedpan, or urinal?: A Lot Help from another person bathing (including washing, rinsing, drying)?: A Lot Help from another person to put on and taking off regular upper body clothing?: A Little Help from another person to put on and taking off regular lower body clothing?: A Lot 6 Click Score: 17    End of Session Equipment Utilized During Treatment: Oxygen  OT Visit Diagnosis: Muscle weakness (generalized) (M62.81);Pain;Unsteadiness on feet (R26.81)   Activity Tolerance Patient tolerated treatment well   Patient Left in bed;with call bell/phone within reach;with bed alarm set;with family/visitor present   Nurse Communication Mobility status        Time: 0927-1000 OT Time Calculation (min): 33 min  Charges: OT General Charges $OT Visit: 1 Visit OT Treatments $Self Care/Home Management : 8-22 mins $Therapeutic Activity:  8-22 mins  Darleen Crocker, MS, OTR/L , CBIS ascom [phone]  04/26/22, 12:38 PM

## 2022-04-26 NOTE — Progress Notes (Signed)
D/w Dr. Laverta [place] thoracic surgery from [facility], she will be placed on a waiting list. She agrees w Me that she may benefit from VATS.  We will start antifungal since WBC is going up

## 2022-04-26 NOTE — Progress Notes (Signed)
Inpatient Rehab Admissions Coordinator:   Note WBC up, right CT still in and may need to be exchanged.  Mobilizing with little physical assist but limited endurance 2/2 ongoing medical issues.  Continuing to follow.    Shann Medal, PT, DPT Admissions Coordinator [phone] 04/26/22  10:07 AM

## 2022-04-26 NOTE — Anesthesia Postprocedure Evaluation (Signed)
Anesthesia Post Note  Patient: Kelly Wood  Procedure(s) Performed: XI ROBOTIC ASSISTED PARAESOPHAGEAL HERNIA REPAIR CHEST TUBE INSERTION (Right: Chest) XI ROBOTIC ASSISTED JEJUNOSTOMY TUBE PLACEMENT (Abdomen)  Patient location during evaluation: PACU Anesthesia Type: General Level of consciousness: awake and alert Pain management: pain level controlled Vital Signs Assessment: post-procedure vital signs reviewed and stable Respiratory status: spontaneous breathing, nonlabored ventilation, respiratory function stable and patient connected to nasal cannula oxygen Cardiovascular status: blood pressure returned to baseline and stable Postop Assessment: no apparent nausea or vomiting Anesthetic complications: no   No notable events documented.   Last Vitals:  Vitals:   04/26/22 0415 04/26/22 0906  BP: 108/70 136/84  Pulse: (!) 127 75  Resp: 20 14  Temp: 36.9 C 37.2 C  SpO2: 98% 98%    Last Pain:  Vitals:   04/26/22 0906  TempSrc: Oral  PainSc:                  Dimas Millin

## 2022-04-26 NOTE — Progress Notes (Signed)
Notified by CCMD after 2200 that the patient's HR was sustaining in the 140s. Patient was noted to be asymptomatic upon assessment. Scheduled 50 mg PO metoprolol given earlier at 2102. Patient was given PRN 5 mg IV metoprolol at 2212; HR decreased to 120s-130s sustaining. Notified MD Pabon and MD Gollan for new orders. Per MD Gollan, give 5 mg lopressor IV x1 and per MD Pabon, PO lopressor order modified w/ 1st dose for 0115. Per MD Gollan, okay to give both.  Earleen Reaper, RN

## 2022-04-26 NOTE — Progress Notes (Signed)
Progress Note  Patient Name: Kelly Wood Date of Encounter: 04/26/2022  Primary Cardiologist: new - consult by [place]  Subjective   We signed off last week.  However, the patient developed tachycardia early this morning and responded to 1 dose of IV metoprolol.  There was a concern about poor absorption of oral metoprolol and she was switched to the liquid form.  She is in sinus rhythm this morning.  Overall, she does not feel well with continued nausea and bloating sensation.  In addition, her white cell count seems to be gradually increasing.  Inpatient Medications    Scheduled Meds:  Chlorhexidine Gluconate Cloth  6 each Topical Daily   Chlorhexidine Gluconate Cloth  6 each Topical Q0600   enoxaparin (LOVENOX) injection  40 mg Subcutaneous Q24H   feeding supplement  1 Container Oral TID BM   fiber supplement (BANATROL TF)  60 mL Per Tube BID   free water  80 mL Per Tube Q4H   furosemide  40 mg Intravenous BID   levothyroxine  50 mcg Per Tube Q0600   metoprolol tartrate  25 mg Per Tube BID   pantoprazole (PROTONIX) IV  40 mg Intravenous Q12H   Continuous Infusions:  sodium chloride Stopped (04/22/22 2207)   acetaminophen 1,000 mg (04/26/22 0634)   albumin human     feeding supplement (OSMOLITE 1.2 CAL) 45 mL/hr at 04/26/22 0407   piperacillin-tazobactam (ZOSYN)  IV 3.375 g (04/26/22 0534)   PRN Meds: sodium chloride, albuterol, [DISCONTINUED] diphenhydrAMINE **OR** diphenhydrAMINE, hydrALAZINE, iohexol, ketorolac, menthol-cetylpyridinium, metoprolol tartrate, morphine injection, naphazoline-glycerin, ondansetron (ZOFRAN) IV, mouth rinse, oxyCODONE, phenol, prochlorperazine   Vital Signs    Vitals:   04/25/22 2016 04/25/22 2321 04/26/22 0415 04/26/22 0906  BP: 137/73 (!) 126/93 108/70 136/84  Pulse: 84 (!) 130 (!) 127 75  Resp: '20 20 20 14  '$ Temp: 98.9 F (37.2 C) 98 F (36.7 C) 98.4 F (36.9 C) 99 F (37.2 C)  TempSrc: Oral Oral Oral Oral  SpO2: 99% 97% 98%  98%  Weight:      Height:        Intake/Output Summary (Last 24 hours) at 04/26/2022 1014 Last data filed at 04/26/2022 0407 Gross per 24 hour  Intake 1569.68 ml  Output 275 ml  Net 1294.68 ml    Filed Weights   04/18/22 0500 04/20/22 0334 04/24/22 0345  Weight: 62.5 kg 62.5 kg 60.2 kg    Telemetry    Sinus rhythm this morning.  Intermittent atrial tachycardia overnight- Personally Reviewed  ECG    No new tracings - Personally Reviewed  Physical Exam   GEN: No acute distress.   Neck: No JVD. Cardiac: RRR, no murmurs, rubs, or gallops.  Respiratory: Clear to auscultation bilaterally.  GI: Soft, nontender, non-distended.   MS: No edema; No deformity. Neuro:  Alert and oriented x 3; Nonfocal.  Psych: Normal affect.  Labs    Chemistry Recent Labs  Lab 04/23/22 0526 04/25/22 0548 04/26/22 0300  NA 130* 131* 129*  K 3.6 4.7 4.7  CL 95* 97* 95*  CO2 '26 25 26  '$ GLUCOSE 150* 113* 132*  BUN '19 19 19  '$ CREATININE 0.58 0.49 0.50  CALCIUM 7.5* 7.5* 7.7*  PROT  --  5.8* 6.0*  ALBUMIN  --  1.8* 1.8*  AST  --  43* 38  ALT  --  41 44  ALKPHOS  --  199* 184*  BILITOT  --  <0.1* 0.4  GFRNONAA >60 >60 >60  ANIONGAP 9  9 8      Hematology Recent Labs  Lab 04/23/22 0526 04/25/22 0854 04/26/22 0300  WBC 18.5* 25.5* 28.4*  RBC 3.81* 3.57* 3.67*  HGB 10.3* 9.6* 9.8*  HCT 31.6* 30.3* 30.9*  MCV 82.9 84.9 84.2  MCH 27.0 26.9 26.7  MCHC 32.6 31.7 31.7  RDW 15.1 15.3 15.4  PLT 416* 515* 640*     Cardiac EnzymesNo results for input(s): "TROPONINI" in the last 168 hours. No results for input(s): "TROPIPOC" in the last 168 hours.   BNPNo results for input(s): "BNP", "PROBNP" in the last 168 hours.   DDimer No results for input(s): "DDIMER" in the last 168 hours.   Radiology    DG ESOPHAGUS W SINGLE CM (SOL OR THIN BA)  Result Date: 04/21/2022 IMPRESSION: 1. No extraluminal contrast to suggest esophageal perforation. Electronically Signed   By: Kathreen Devoid M.D.    On: 04/21/2022 14:50   CT CHEST WO CONTRAST  Result Date: 04/20/2022 IMPRESSION: 1. Interval replacement of the right chest tube, with new pigtail drainage catheter traversing the right pleural effusion. No change in right pleural effusion volume. Minimal gas in the right pleural space consistent with recent intervention. 2. Stable small left pleural effusion. 3. Emphysema, with stable dependent lower lobe atelectasis. 4. Postsurgical changes at the gastroesophageal junction, with stable enteric catheter and hiatal hernia. Evaluation of the surgical site is limited without intravenous contrast. 5. Aortic Atherosclerosis (ICD10-I70.0). Coronary artery atherosclerosis. Electronically Signed   By: Randa Ngo M.D.   On: 04/20/2022 16:11    Cardiac Studies   2D echo 04/19/2022: 1. Left ventricular ejection fraction, by estimation, is 55 to 60%. The  left ventricle has normal function. Left ventricular endocardial border  not optimally defined to evaluate regional wall motion. Left ventricular  diastolic parameters are consistent  with Grade I diastolic dysfunction (impaired relaxation).   2. Right ventricular systolic function is normal. The right ventricular  size is normal. There is moderately elevated pulmonary artery systolic  pressure. The estimated right ventricular systolic pressure is 83.4 mmHg.   3. Left atrial size was mildly dilated.   4. The mitral valve is normal in structure. Mild mitral valve  regurgitation. No evidence of mitral stenosis. Moderate mitral annular  calcification.   5. The aortic valve is normal in structure. Aortic valve regurgitation is  not visualized. Aortic valve sclerosis is present, with no evidence of  aortic valve stenosis.   Patient Profile     82 y.o. 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  Assessment & Plan    1. Atrial tachycardia: -Intermittent episodes likely due to hyperadrenergic state and possible underlying infection post complications from hiatal hernia surgery.  I Absorption of oral medications might be an issue given continued GI symptoms.  I agree with switching metoprolol tablet to the oral form 50 mg twice daily. For breakthrough tachycardia, recommend using IV metoprolol 5 mg as needed.  2.  Paraesophageal hernia repair complicated by esophageal leak: She continues to have GI symptoms.  The patient and family seem to be frustrated and considering transfer to a tertiary care center.  For questions or updates, please contact [place] Please consult [email or web address] for contact info under Cardiology/STEMI.  Signed, Kathlyn [place], MD [facility] 04/26/2022, 10:14 AM

## 2022-04-26 NOTE — Consult Note (Signed)
PULMONOLOGY         Date: 04/26/2022,   MRN# [id number] Kelly Wood 08-Jul-1940     AdmissionWeight: 52.6 kg                 CurrentWeight: 60.2 kg  Referring provider: Dr Dahlia Byes   CHIEF COMPLAINT:   Bilateral pleural effusions s/p chest tube insertion    HISTORY OF PRESENT ILLNESS   This is a pleasant 82 year old female with a history of generalized anxiety disorder, anemia, history of COPD and chronic cough with severe GERD and hiatal hernia.  Patient is 12 days postop status post robotic assisted laparoscopic paraesophageal hernia repair complicated by esophageal leak requiring takeback for robotic assisted primary repair of esophageal perforation and placement of a mediastinal drain, feeding jejunostomy placement and right chest tube placement.   She Has Also Been Accepted to [facility] by Thoracic Surgery for Additional Evaluation and Management. She required short Stay in step down unit Now on Medical Floor with Pulmonary Consultation for Findings of Bilateral Pleural Effusion Status Post Chest Tube Placement.  In the Interim Patient Has Been on IV Zosyn and Antifungal Fluconazole.   PAST MEDICAL HISTORY   Past Medical History:  Diagnosis Date   Anemia    Aortic atherosclerosis ([place])    B12 deficiency    Cancer ([place])    basal cell   Cataract cortical, senile    COPD (chronic obstructive pulmonary disease) (HCC)    Dyspnea    Emphysema of lung (HCC)    GERD (gastroesophageal reflux disease)    History of hiatal hernia    Hyperlipidemia    Hypertension    Hypothyroidism    Osteoarthritis of left hip    Osteoporosis    Shingles      SURGICAL HISTORY   Past Surgical History:  Procedure Laterality Date   CATARACT EXTRACTION Right    CHEST TUBE INSERTION Right 04/15/2022   Procedure: CHEST TUBE INSERTION;  Surgeon: Jules Husbands, MD;  Location: [facility];  Service: General;  Laterality: Right;   COLONOSCOPY N/A 06/23/2016   Procedure: COLONOSCOPY;   Surgeon: Manya Silvas, MD;  Location: [facility];  Service: Endoscopy;  Laterality: N/A;   COLONOSCOPY  2007   ESOPHAGOGASTRODUODENOSCOPY  03/03/2021   HARDWARE REMOVAL Right 11/06/2020   Procedure: EXCHANGE OF LAG SCREW, RIGHT HIP;  Surgeon: Corky Mull, MD;  Location: [facility];  Service: Orthopedics;  Laterality: Right;   INSERTION OF MESH  04/07/2021   Procedure: INSERTION OF MESH;  Surgeon: Jules Husbands, MD;  Location: [facility];  Service: General;;   INTRAMEDULLARY (IM) NAIL INTERTROCHANTERIC Right 12/26/2018   Procedure: INTRAMEDULLARY (IM) NAIL INTERTROCHANTRIC;  Surgeon: Corky Mull, MD;  Location: [facility];  Service: Orthopedics;  Laterality: Right;   IR PERC PLEURAL DRAIN W/INDWELL CATH W/IMG GUIDE  04/19/2022   LAPAROSCOPIC NISSEN FUNDOPLICATION  40/01/2724   Procedure: LAPAROSCOPIC NISSEN FUNDOPLICATION;  Surgeon: Jules Husbands, MD;  Location: [facility];  Service: General;;   TUBAL LIGATION  1970   XI ROBOTIC ASSISTED PARAESOPHAGEAL HERNIA REPAIR N/A 04/07/2021   Procedure: XI ROBOTIC ASSISTED PARAESOPHAGEAL HERNIA REPAIR with RNFA to assist;  Surgeon: Jules Husbands, MD;  Location: [facility];  Service: General;  Laterality: N/A;   XI ROBOTIC ASSISTED PARAESOPHAGEAL HERNIA REPAIR N/A 04/15/2022   Procedure: XI ROBOTIC ASSISTED PARAESOPHAGEAL HERNIA REPAIR, RNFA to assist;  Surgeon: Jules Husbands, MD;  Location: [facility];  Service: General;  Laterality: N/A;  XI ROBOTIC ASSISTED PARAESOPHAGEAL HERNIA REPAIR N/A 04/15/2022   Procedure: XI ROBOTIC ASSISTED PARAESOPHAGEAL HERNIA REPAIR;  Surgeon: Jules Husbands, MD;  Location: [facility];  Service: General;  Laterality: N/A;     FAMILY HISTORY   Family History  Problem Relation Age of Onset   Breast cancer Neg Hx      SOCIAL HISTORY   Social History   Tobacco Use   Smoking status: Former    Types: Cigarettes    Quit date: 10/24/2006    Years since quitting: 15.5   Smokeless tobacco: Never  Vaping Use   Vaping  Use: Never used  Substance Use Topics   Alcohol use: Yes    Alcohol/week: 1.0 standard drink of alcohol    Types: 1 Glasses of wine per week    Comment: rarely   Drug use: Never     MEDICATIONS    Home Medication:    Current Medication:  Current Facility-Administered Medications:    0.9 %  sodium chloride infusion, , Intravenous, PRN, Jules Husbands, MD, Stopped at 04/22/22 2207   acetaminophen (OFIRMEV) IV 1,000 mg, 1,000 mg, Intravenous, Q6H, Pabon, Diego F, MD, Last Rate: 400 mL/hr at 04/26/22 0634, 1,000 mg at 04/26/22 0634   albuterol (PROVENTIL) (2.5 MG/3ML) 0.083% nebulizer solution 3 mL, 3 mL, Inhalation, Q6H PRN, Pabon, Diego F, MD   Chlorhexidine Gluconate Cloth 2 % PADS 6 each, 6 each, Topical, Daily, Pabon, [place] F, MD, 6 each at 04/25/22 0850   Chlorhexidine Gluconate Cloth 2 % PADS 6 each, 6 each, Topical, Q0600, Pabon, Diego F, MD, 6 each at 04/26/22 0519   [DISCONTINUED] diphenhydrAMINE (BENADRYL) 12.5 MG/5ML elixir 12.5 mg, 12.5 mg, Oral, Q6H PRN **OR** diphenhydrAMINE (BENADRYL) injection 12.5 mg, 12.5 mg, Intravenous, Q6H PRN, Pabon, Diego F, MD   enoxaparin (LOVENOX) injection 40 mg, 40 mg, Subcutaneous, Q24H, Pabon, Diego F, MD, 40 mg at 04/26/22 7510   feeding supplement (BOOST / RESOURCE BREEZE) liquid 1 Container, 1 Container, Oral, TID BM, Tylene Fantasia, PA-C, 1 Container at 04/25/22 1942   feeding supplement (OSMOLITE 1.2 CAL) liquid 1,000 mL, 1,000 mL, Per Tube, Continuous, Pabon, Diego F, MD, Last Rate: 45 mL/hr at 04/26/22 0407, Infusion Verify at 04/26/22 0407   fiber supplement (BANATROL TF) liquid 60 mL, 60 mL, Per Tube, BID, Tylene Fantasia, PA-C, 60 mL at 04/26/22 0936   free water 80 mL, 80 mL, Per Tube, Q4H, Pabon, Diego F, MD, 80 mL at 04/26/22 0342   hydrALAZINE (APRESOLINE) injection 10 mg, 10 mg, Intravenous, Q2H PRN, Pabon, Diego F, MD, 10 mg at 04/18/22 1824   iohexol (OMNIPAQUE) 350 MG/ML injection 75 mL, 75 mL, Intravenous, Once PRN,  Pabon, Diego F, MD   ketorolac (TORADOL) 15 MG/ML injection 15 mg, 15 mg, Intravenous, Q6H PRN, Pabon, Diego F, MD   levothyroxine (TIROSINT-SOL) 25 MCG/ML oral solution 50 mcg, 50 mcg, Per Tube, Q0600, Pabon, Diego F, MD, 50 mcg at 04/26/22 0537   menthol-cetylpyridinium (CEPACOL) lozenge 3 mg, 1 lozenge, Oral, PRN, Tylene Fantasia, PA-C, 3 mg at 04/18/22 0519   metoprolol tartrate (LOPRESSOR) 25 mg/10 mL oral suspension 50 mg, 50 mg, Per Tube, BID, Pabon, Diego F, MD, 50 mg at 04/26/22 0936   metoprolol tartrate (LOPRESSOR) injection 5 mg, 5 mg, Intravenous, Q6H PRN, Minna Merritts, MD, 5 mg at 04/25/22 2212   morphine (PF) 4 MG/ML injection 2 mg, 2 mg, Intravenous, Q4H PRN, Pabon, Diego F, MD, 2 mg at 04/25/22 [id number]  naphazoline-glycerin (CLEAR EYES REDNESS) ophth solution 1-2 drop, 1-2 drop, Both Eyes, QID PRN, Pabon, Diego F, MD   ondansetron (ZOFRAN) injection 4 mg, 4 mg, Intravenous, Q6H PRN, Pabon, Diego F, MD, 4 mg at 04/26/22 0600   Oral care mouth rinse, 15 mL, Mouth Rinse, PRN, Pabon, Diego F, MD   oxyCODONE (ROXICODONE) 5 MG/5ML solution 5 mg, 5 mg, Per Tube, Q4H PRN, Pabon, Diego F, MD   pantoprazole (PROTONIX) injection 40 mg, 40 mg, Intravenous, Q12H, Pabon, Diego F, MD, 40 mg at 04/26/22 0936   phenol (CHLORASEPTIC) mouth spray 1 spray, 1 spray, Mouth/Throat, PRN, Tylene Fantasia, PA-C, 1 spray at 04/19/22 0815   piperacillin-tazobactam (ZOSYN) IVPB 3.375 g, 3.375 g, Intravenous, Q8H, Pabon, Diego F, MD, Last Rate: 12.5 mL/hr at 04/26/22 0534, 3.375 g at 04/26/22 0534   prochlorperazine (COMPAZINE) injection 5 mg, 5 mg, Intravenous, Q4H PRN, Pabon, Diego F, MD, 5 mg at 04/26/22 0414    ALLERGIES   Ativan [lorazepam], Azithromycin, Levofloxacin, and Sulfa antibiotics     REVIEW OF SYSTEMS    Review of Systems:  Gen:  Denies  fever, sweats, chills weigh loss  HEENT: Denies blurred vision, double vision, ear pain, eye pain, hearing loss, nose bleeds, sore  throat Cardiac:  No dizziness, chest pain or heaviness, chest tightness,edema Resp:   reports dyspnea chronically  Gi: Denies swallowing difficulty, stomach pain, nausea or vomiting, diarrhea, constipation, bowel incontinence Gu:  Denies bladder incontinence, burning urine Ext:   Denies Joint pain, stiffness or swelling Skin: Denies  skin rash, easy bruising or bleeding or hives Endoc:  Denies polyuria, polydipsia , polyphagia or weight change Psych:   Denies depression, insomnia or hallucinations   Other:  All other systems negative   VS: BP 136/84 (BP Location: Left Arm)   Pulse 75   Temp 99 F (37.2 C) (Oral)   Resp 14   Ht 5' (1.524 m)   Wt 60.2 kg   SpO2 98%   BMI 25.92 kg/m      PHYSICAL EXAM    GENERAL:NAD, no fevers, chills, no weakness no fatigue HEAD: Normocephalic, atraumatic.  EYES: Pupils equal, round, reactive to light. Extraocular muscles intact. No scleral icterus.  MOUTH: Moist mucosal membrane. Dentition intact. No abscess noted.  EAR, NOSE, THROAT: Clear without exudates. No external lesions.  NECK: Supple. No thyromegaly. No nodules. No JVD.  PULMONARY: decreased breath sounds with mild rhonchi worse at bases bilaterally. + r chest tube CARDIOVASCULAR: S1 and S2. Regular rate and rhythm. No murmurs, rubs, or gallops. No edema. Pedal pulses 2+ bilaterally.  GASTROINTESTINAL: Soft, nontender, nondistended. No masses. Positive bowel sounds. No hepatosplenomegaly. +JP drain left abd. MUSCULOSKELETAL: No swelling, clubbing, or edema. Range of motion full in all extremities.  NEUROLOGIC: Cranial nerves II through XII are intact. No gross focal neurological deficits. Sensation intact. Reflexes intact.  SKIN: No ulceration, lesions, rashes, or cyanosis. Skin warm and dry. Turgor intact.  PSYCHIATRIC: Mood, affect within normal limits. The patient is awake, alert and oriented x 3. Insight, judgment intact.       IMAGING   '@IMAGES'$ @   ASSESSMENT/PLAN    Bilateral pleural effusions s/p paraesophageal hernia repair with esophageal leak.      Reviewed CT chest with bilateral pleural effusions.  Patient has adequate renal function and we can attempt to diurese more.  I have added lasix 40 IV BID.      - will culture pleural effusion with gram stain and perform additional testing with  cel count and diff, pH , microbiology.   - There is hypoalbuminemia which may be contributing to this and is likely due to poor nourishment over past few weeks.     -MRSA pcr- if positive need to add vanco until micro returns   -Procalcitonin trend   - Zosyn and fluconazole to be continued    - WBC count trending up but patient does not appear toxic and is not in distress clinically , she does not have labored breathing and is able to speak in full sentences on room air during my evaluation.   Chronic centirlobular emphysema with COPD   No signs of acute exacerbation    Hypoalbuminemia      - RD nutrition consult        -contributing to pleural effusion         Thank you for allowing me to participate in the care of this patient.   Patient/Family are satisfied with care plan and all questions have been answered.    Provider disclosure: Patient with at least one acute or chronic illness or injury that poses a threat to life or bodily function and is being managed actively during this encounter.  All of the below services have been performed independently by signing provider:  review of prior documentation from internal and or external health records.  Review of previous and current lab results.  Interview and comprehensive assessment during patient visit today. Review of current and previous chest radiographs/CT scans. Discussion of management and test interpretation with health care team and patient/family.   This document was prepared using Dragon voice recognition software and may include unintentional dictation errors.     Ottie Glazier, M.D.   Division of Pulmonary & Critical Care Medicine

## 2022-04-26 NOTE — Consult Note (Addendum)
POD # 10  s/p redo hiatal hernia and take back for eso perf and jejunostomy  Tolerating tube feeds Very weak On and off SVT Afebrile Wbc bumped continues to increase Mediastinal drain fell out of place and now is intra-abdominally Repeat CT yesterday no definitive leak but loculated rigth collection  Objective: Vital signs in last 24 hours: Temp:  [98 F (36.7 C)-99 F (37.2 C)] 99 F (37.2 C) (01/22 0906) Pulse Rate:  [75-130] 75 (01/22 0906) Resp:  [14-20] 14 (01/22 0906) BP: (108-153)/(70-93) 136/84 (01/22 0906) SpO2:  [97 %-99 %] 98 % (01/22 0906) Last BM Date : 04/25/22  Intake/Output from previous day: 01/21 0701 - 01/22 0700 In: 1569.7 [P.O.:120; NG/GT:1268.3; IV Piggyback:101.4] Out: 300 [Urine:200; Drains:100] Intake/Output this shift: No intake/output data recorded.  Physical exam:  Debilitated Chest: decrese bases bilateral Right CT no air leak, serous fluid Abdsoft, mildly distended, incisions c/d/I, drain serous  Lab Results: CBC  Recent Labs    04/25/22 0854 04/26/22 0300  WBC 25.5* 28.4*  HGB 9.6* 9.8*  HCT 30.3* 30.9*  PLT 515* 640*   BMET Recent Labs    04/25/22 0548 04/26/22 0300  NA 131* 129*  K 4.7 4.7  CL 97* 95*  CO2 25 26  GLUCOSE 113* 132*  BUN 19 19  CREATININE 0.49 0.50  CALCIUM 7.5* 7.7*   PT/INR No results for input(s): "LABPROT", "INR" in the last 72 hours. ABG No results for input(s): "PHART", "HCO3" in the last 72 hours.  Invalid input(s): "PCO2", "PO2"  Studies/Results: CT CHEST W CONTRAST  Result Date: 04/25/2022 CLINICAL DATA:  Esophageal perforation. Recent hiatal hernia surgery, possible Nissen fundoplication. EXAM: CT CHEST WITH CONTRAST TECHNIQUE: Multidetector CT imaging of the chest was performed during intravenous contrast administration. RADIATION DOSE REDUCTION: This exam was performed according to the departmental dose-optimization program which includes automated exposure control, adjustment of the mA  and/or kV according to patient size and/or use of iterative reconstruction technique. CONTRAST:  19m OMNIPAQUE IOHEXOL 300 MG/ML  SOLN COMPARISON:  04/23/2022, 04/20/2022, 04/18/2022 and 09/29/2021 FINDINGS: Cardiovascular: Mild cardiomegaly. Calcified plaque over the left main and 3 vessel coronary arteries. Thoracic aorta is normal in caliber. There is calcified plaque over the thoracic aorta with stable small focal mural thrombus along the periphery of the descending thoracic aorta. Pulmonary arterial system is unremarkable. Remaining vascular structures are unremarkable. Mediastinum/Nodes: There are a few stable shotty mediastinal lymph nodes present. No mediastinal or hilar adenopathy. Contrast is present within the esophagus as was present on the prior exam. There is evidence of patient's hiatal hernia unchanged from June 2023. Contrast fills the right lateral aspect of this hiatal hernia unchanged from 09/29/2021. Note that contrast was not filling this part of the hernia on the recent prior exam from 04/23/2022. There is moderate subcutaneous edema of the segment of stomach crossing the diaphragm unchanged. No free air. Lungs/Pleura: There is moderate centrilobular emphysematous disease. Stable moderate size bilateral pleural effusions with associated bibasilar compressive atelectasis. Right basilar pleural pigtail drainage catheter unchanged. No pneumothorax. Upper Abdomen: Calcified plaque over the abdominal aorta. Remainder of the visualized upper abdomen is unchanged. Musculoskeletal: No focal abnormality. Degenerative changes of the spine. IMPRESSION: 1. Evidence of patient's hiatal hernia with reported recent surgery involving this hernia. Appearance is not significantly changed from June 2023. Contrast fills the right lateral aspect of this hiatal hernia unchanged from 09/29/2021. Note that contrast was not filling this part of the hernia on the recent prior exam from 04/23/2022. There  is moderate  subcutaneous edema of the segment of stomach crossing the diaphragm unchanged from recent prior exams. No free air. Recommend correlation with patient's recent surgical history. 2. Stable moderate size bilateral pleural effusions with associated bibasilar compressive atelectasis. Right basilar pleural pigtail drainage catheter unchanged. No pneumothorax. 3. Emphysema. 4. Aortic atherosclerosis. Atherosclerotic coronary artery disease. Aortic Atherosclerosis (ICD10-I70.0) and Emphysema (ICD10-J43.9). Electronically Signed   By: Marin Olp M.D.   On: 04/25/2022 15:32    Anti-infectives: Anti-infectives (From admission, onward)    Start     Dose/Rate Route Frequency Ordered Stop   04/15/22 1730  piperacillin-tazobactam (ZOSYN) IVPB 3.375 g  Status:  Discontinued        3.375 g 100 mL/hr over 30 Minutes Intravenous Every 8 hours 04/15/22 1716 04/15/22 1719   04/15/22 1730  piperacillin-tazobactam (ZOSYN) IVPB 3.375 g        3.375 g 12.5 mL/hr over 240 Minutes Intravenous Every 8 hours 04/15/22 1719 04/29/22 2359   04/15/22 1730  piperacillin-tazobactam (ZOSYN) 3.375 GM/50ML IVPB       Note to Pharmacy: Rutherford Nail E: cabinet override      04/15/22 1730 04/16/22 0544   04/15/22 1445  ceFAZolin (ANCEF) IVPB 2g/100 mL premix  Status:  Discontinued        2 g 200 mL/hr over 30 Minutes Intravenous Every 8 hours 04/15/22 1435 04/15/22 1719   04/15/22 0645  ceFAZolin (ANCEF) IVPB 2g/100 mL premix        2 g 200 mL/hr over 30 Minutes Intravenous On call to O.R. 04/15/22 6333 04/15/22 0806       Assessment/Plan: Esophageal perf w worsening wbc, concerned for some infected pleural/mediastinal  space  D/W Dr. Roxan Hockey CT surgeon [facility]. He also saw the images . He agrees that the pleural collection will need to be address, I have contacted Dr. Delsa Bern from pulmonary medicine to help me w TPA and DNAase. If this fails may need to do VATS and potential stent ( if that is the case we will  need to xfer) Restart tf at 20cc as she felt full Keep NPO and use feeding jejunostomy for liquid meds I did reach out to [facility] and they have denied her xfer I have also contacted [facility] ( per family wishes) .  They are also open to xfer to [facility] but want to explore options I do feel that she may need stent and VATS as next steps She will need to continue a/bs as she has not improved Family updated in detail and multiple conversation and discussions were held   [name], MD, [facility]  04/26/2022

## 2022-04-26 NOTE — Progress Notes (Signed)
Nutrition Follow-up  DOCUMENTATION CODES:   Not applicable  INTERVENTION:   -D/c Boost Breeze -TF via j-tube:   Trickle feeds per MD Osmolite 1.2 @ 20 ml/hr  30 ml free water flush every 4 hours  Once medically able, increase by 10 ml every 12 hours to goal rate of 60 ml/hr.   80 ml free water flush every 4 hours   Tube feeding regimen provides 1728 kcal (100% of needs), 80 grams of protein, and 1181 ml of H2O.  Total free water: 1661 ml daily  -60 ml Banatrol BID  NUTRITION DIAGNOSIS:   Increased nutrient needs related to post-op healing as evidenced by estimated needs.  Ongoing  GOAL:   Patient will meet greater than or equal to 90% of their needs  Unmet  MONITOR:   Diet advancement, Labs, Weight trends, TF tolerance, I & O's, Skin  REASON FOR ASSESSMENT:   Consult Enteral/tube feeding initiation and management, Assessment of nutrition requirement/status  ASSESSMENT:   82 y/o female with h/o GERD, HTN, shingles, thyroid disease, HTN, hiatal hernia s/p Nissen fundiplocation and paraesophageal hernia repair 04/07/2021, COPD, B12 deficiency, IDA, hip fracture s/p repair 2020 and lumbar spondlyosis who is admitted with recurrent hiatal hernia s/p robotic assisted laparoscopic repair of  paraesophageal hernia with Bio-A Mesh and Nissen fundoplication, redo 0/17 complicated by esophageal perforation now s/p robotic assisted primary repair of esophageal perforation 1/11 (with chest tube placement, jejunostomy tube placement, blake drain placement, removal of BioA mesh and omental flap).  1/11- s/p rt chest tube placement, robotic assisted primary repair to esophageal perforation, robotic assisted jejunostomy tube, robotic mediastinal blake drain placement, removal of BioA mesh, and omental flap  1/15- s/p rt chest tube placement  Reviewed I/O's: +1.3 L x 24 hours and +16.9 L since admission  UOP: 200 ml x 24 hours  Drain output: 200 ml x 24 hours  Chest tube output:  0 ml x 24 hours  Pt sitting up in chair at time of visit, laughing, smiling, and joking with this RD.  Pt husband also at bedside. Both reports frustration over lack of progress. Per husband, pt was tolerating TF well, but struggled at goal rate of 60 ml/hr. This was backed down to 45 ml/hr yesterday. Pt complained of abdominal pain yesterday. Noted pt disconnected from feeding pump; husband reports concern that pt has not had nutrition in over 8 hours.   Case discussed with Dr. Dahlia Byes; plan to resume trickle feeds today.   Medications reviewed and include IV albumin  Per TOC notes, plan for CIR once medically stable.   Labs reviewed: Na: 129, CBGS: 115-134 (inpatient orders for glycemic control are ).    Diet Order:   Diet Order             Diet NPO time specified Except for: Ice Chips, Other (See Comments)  Diet effective now                   EDUCATION NEEDS:   Education needs have been addressed  Skin:  Skin Assessment: Skin Integrity Issues: Skin Integrity Issues:: Incisions Incisions: closed abdomen, s/p j-tube placement  Last BM:  04/26/22 (type 7)  Height:   Ht Readings from Last 1 Encounters:  04/15/22 5' (1.524 m)    Weight:   Wt Readings from Last 1 Encounters:  04/24/22 60.2 kg    Ideal Body Weight:  45.5 kg  BMI:  Body mass index is 25.92 kg/m.  Estimated Nutritional Needs:  Kcal:  1650-1850  Protein:  85-100 grams  Fluid:  > 1.6 L    Kelly Wood, RD, LDN, [place] Registered Dietitian II Certified Diabetes Care and Education Specialist Please refer to [facility] for RD and/or RD on-call/weekend/after hours pager

## 2022-04-26 NOTE — Progress Notes (Addendum)
Spoke with patient and husband via telephone about the PICC.  Explained purpose, risk and benefits of the line as well as the procedure.  Questions answered.  Patient and husband wish to "think about it" and are concerned that with her pending transfer to a different hospital that the PICC will not be used by the accepting hospital or will be removed prior to her being transferred.  Patient currently has 2 PIVs for her IV medications.  Patient/husband aware that if PIV stops working through the night that she will need to have new PIV started.  Both are agreeable and wish to wait.  IV team will follow up 1-23.

## 2022-04-26 NOTE — Progress Notes (Addendum)
Progress Note    Kelly Wood  FBP:[id number] DOB: 19-Jan-1941  DOA: 04/15/2022 PCP: Idelle Crouch, MD      Brief Narrative:    Medical records reviewed and are as summarized below:  Kelly Wood is a 82 y.o. female with PMH of HTN, HLD, hypothyroid, COPD, anemia, GERD, s/p repair of paraesophageal hernia robotically approximately 11 months ago, presented to ED on 04/15/2022 with persistent bloating, eructation and chest discomfort.  She was diagnosed with symptomatic recurrent paraesophageal hernia.  Admitted by the surgical team.  She underwent robotic assisted laparoscopic repair of paraesophageal hernia with bio mesh and Nissen fundoplication, redo on 8/24 which was complicated by esophageal perforation, right pneumothorax requiring right chest tube placement, robotic assisted repair of esophageal perforation, jejunostomy tube, mediastinal Blake drain placement.  TRH consulted 1/12 for management of medical issues.  1/14: New onset narrow complex tachycardia, SVT, transferred to stepdown.  Cardiology consulted.  Started on IV amiodarone and maintaining SR.        Assessment/Plan:   Principal Problem:   S/P repair of paraesophageal hernia Active Problems:   Esophageal perforation   SVT (supraventricular tachycardia)   Atrial tachycardia   Hiatal hernia   Nutrition Problem: Increased nutrient needs Etiology: post-op healing  Signs/Symptoms: estimated needs   Body mass index is 25.92 kg/m.   SVT/paroxysmal atrial tachycardia: Heart rate went into the 140s last night requiring IV metoprolol.  Telemetry today shows normal heart rate with normal sinus rhythm.  Continue metoprolol.  She is no longer on IV amiodarone.   2D echo showed EF estimated at 55 to 23%, grade 1 diastolic dysfunction, moderately elevated pulmonary artery systolic pressure  Delirium, intermittent confusion: Improved.  Avoid benzodiazepines because patient reportedly had a paradoxical  response to IV Ativan.  Minimize opioids and sedating medications.   S/p robotic assisted laparoscopic paraesophageal hernia repair 5/36, complicated by esophageal leak requiring immediate takeback for robotic assisted primary repair of esophageal perforation, placement of mediastinal drain/right chest tube/feeding jejunostomy tube, leukocytosis, concern for infected right pleural fluid: Worsening leukocytosis.  Repeat CT chest on 04/25/2022 reviewed. He still on IV Zosyn.  Notified Dr. Dahlia Byes, general surgeon, about husband's concerns.  Hypokalemia: Improved  Hyponatremia: Sodium level is slightly trending down.  Monitor BMP.  Other comorbidities include hyperlipidemia, hypothyroidism, hypertension, chronic anemia   Diet Order             Diet NPO time specified Except for: Ice Chips, Other (See Comments)  Diet effective now                            Consultants: Cardiologist General surgeon  Procedures:  Right chest tube placement Robotic assisted primary repair of esophageal perforation Robotic assisted jejunostomy tube Robotic mediastinal blake drain placement Removal of BioA mesh Omental flap as tissue buttressing        Medications:    Chlorhexidine Gluconate Cloth  6 each Topical Daily   Chlorhexidine Gluconate Cloth  6 each Topical Q0600   enoxaparin (LOVENOX) injection  40 mg Subcutaneous Q24H   fiber supplement (BANATROL TF)  60 mL Per Tube BID   free water  30 mL Per Tube Q4H   furosemide  40 mg Intravenous BID   levothyroxine  50 mcg Per Tube Q0600   metoprolol tartrate  25 mg Per Tube BID   pantoprazole (PROTONIX) IV  40 mg Intravenous Q12H   Continuous Infusions:  sodium chloride  acetaminophen 1,000 mg (04/26/22 1119)   albumin human 12.5 g (04/26/22 1028)   feeding supplement (OSMOLITE 1.2 CAL) 1,000 mL (04/26/22 1207)   piperacillin-tazobactam (ZOSYN)  IV 3.375 g (04/26/22 1534)     Anti-infectives (From admission, onward)     Start     Dose/Rate Route Frequency Ordered Stop   04/15/22 1730  piperacillin-tazobactam (ZOSYN) IVPB 3.375 g  Status:  Discontinued        3.375 g 100 mL/hr over 30 Minutes Intravenous Every 8 hours 04/15/22 1716 04/15/22 1719   04/15/22 1730  piperacillin-tazobactam (ZOSYN) IVPB 3.375 g        3.375 g 12.5 mL/hr over 240 Minutes Intravenous Every 8 hours 04/15/22 1719 04/29/22 2359   04/15/22 1730  piperacillin-tazobactam (ZOSYN) 3.375 GM/50ML IVPB       Note to Pharmacy: Rutherford Nail E: cabinet override      04/15/22 1730 04/16/22 0544   04/15/22 1445  ceFAZolin (ANCEF) IVPB 2g/100 mL premix  Status:  Discontinued        2 g 200 mL/hr over 30 Minutes Intravenous Every 8 hours 04/15/22 1435 04/15/22 1719   04/15/22 0645  ceFAZolin (ANCEF) IVPB 2g/100 mL premix        2 g 200 mL/hr over 30 Minutes Intravenous On call to O.R. 04/15/22 0626 04/15/22 0806              Family Communication/Anticipated D/C date and plan/Code Status   DVT prophylaxis: enoxaparin (LOVENOX) injection 40 mg Start: 04/16/22 0800 SCDs Start: 04/15/22 1132     Code Status: Full Code  Family Communication: Husband at the bedside Disposition Plan: To be determined by general surgeon       Subjective:   Interval events noted.  Her husband was at the bedside.  He is frustrated about the lack of improvement.  He complained that tube feeding has been discontinued.  He said he is contemplating transfer to another hospital.  Objective:    Vitals:   04/25/22 2016 04/25/22 2321 04/26/22 0415 04/26/22 0906  BP: 137/73 (!) 126/93 108/70 136/84  Pulse: 84 (!) 130 (!) 127 75  Resp: '20 20 20 14  '$ Temp: 98.9 F (37.2 C) 98 F (36.7 C) 98.4 F (36.9 C) 99 F (37.2 C)  TempSrc: Oral Oral Oral Oral  SpO2: 99% 97% 98% 98%  Weight:      Height:       No data found.   Intake/Output Summary (Last 24 hours) at 04/26/2022 1603 Last data filed at 04/26/2022 0407 Gross per 24 hour  Intake  1569.68 ml  Output 250 ml  Net 1319.68 ml   Filed Weights   04/18/22 0500 04/20/22 0334 04/24/22 0345  Weight: 62.5 kg 62.5 kg 60.2 kg    Exam:  GEN: NAD SKIN: Warm and dry EYES: No pallor or icterus ENT: MMM CV: RRR PULM: No wheezing or rales heard, chest tube right chest wall ABD: soft, ND, NT, +BS, dressing on midline surgical wound is clean, dry and intact  CNS: AAO x 3, non focal EXT: No edema or tenderness     Data Reviewed:   I have personally reviewed following labs and imaging studies:  Labs: Labs show the following:   Basic Metabolic Panel: Recent Labs  Lab 04/20/22 0323 04/21/22 0436 04/22/22 0558 04/23/22 0526 04/25/22 0548 04/26/22 0300  NA 133* 131* 128* 130* 131* 129*  K 3.4* 3.4* 3.5 3.6 4.7 4.7  CL 100 98 94* 95* 97* 95*  CO2 22  $'26 26 26 25 26  'i$ GLUCOSE 134* 129* 165* 150* 113* 132*  BUN '20 16 20 19 19 19  '$ CREATININE 0.58 0.52 0.42* 0.58 0.49 0.50  CALCIUM 7.6* 7.7* 7.4* 7.5* 7.5* 7.7*  MG 1.9 2.0 2.0  --  2.2  --   PHOS 4.3 3.9 3.7  --   --   --    GFR Estimated Creatinine Clearance: 46.3 mL/min (by C-G formula based on SCr of 0.5 mg/dL). Liver Function Tests: Recent Labs  Lab 04/25/22 0548 04/26/22 0300  AST 43* 38  ALT 41 44  ALKPHOS 199* 184*  BILITOT <0.1* 0.4  PROT 5.8* 6.0*  ALBUMIN 1.8* 1.8*   No results for input(s): "LIPASE", "AMYLASE" in the last 168 hours. No results for input(s): "AMMONIA" in the last 168 hours. Coagulation profile No results for input(s): "INR", "PROTIME" in the last 168 hours.  CBC: Recent Labs  Lab 04/20/22 0323 04/21/22 0436 04/23/22 0526 04/25/22 0854 04/26/22 0300  WBC 11.3* 15.4* 18.5* 25.5* 28.4*  HGB 10.1* 10.1* 10.3* 9.6* 9.8*  HCT 30.8* 31.4* 31.6* 30.3* 30.9*  MCV 83.0 83.7 82.9 84.9 84.2  PLT 315 330 416* 515* 640*   Cardiac Enzymes: No results for input(s): "CKTOTAL", "CKMB", "CKMBINDEX", "TROPONINI" in the last 168 hours. BNP (last 3 results) No results for input(s):  "PROBNP" in the last 8760 hours. CBG: Recent Labs  Lab 04/25/22 2324 04/26/22 0419 04/26/22 0808 04/26/22 1139 04/26/22 1505  GLUCAP 137* 115* 116* 134* 109*   D-Dimer: No results for input(s): "DDIMER" in the last 72 hours. Hgb A1c: No results for input(s): "HGBA1C" in the last 72 hours. Lipid Profile: No results for input(s): "CHOL", "HDL", "LDLCALC", "TRIG", "CHOLHDL", "LDLDIRECT" in the last 72 hours. Thyroid function studies: No results for input(s): "TSH", "T4TOTAL", "T3FREE", "THYROIDAB" in the last 72 hours.  Invalid input(s): "FREET3" Anemia work up: No results for input(s): "VITAMINB12", "FOLATE", "FERRITIN", "TIBC", "IRON", "RETICCTPCT" in the last 72 hours. Sepsis Labs: Recent Labs  Lab 04/21/22 0436 04/23/22 0526 04/25/22 0854 04/26/22 0300  WBC 15.4* 18.5* 25.5* 28.4*    Microbiology Recent Results (from the past 240 hour(s))  MRSA Next Gen by PCR, Nasal     Status: None   Collection Time: 04/18/22  9:03 AM   Specimen: Nasal Mucosa; Nasal Swab  Result Value Ref Range Status   MRSA by PCR Next Gen NOT DETECTED NOT DETECTED Final    Comment: (NOTE) The GeneXpert MRSA Assay (FDA approved for NASAL specimens only), is one component of a comprehensive MRSA colonization surveillance program. It is not intended to diagnose MRSA infection nor to guide or monitor treatment for MRSA infections. Test performance is not FDA approved in patients less than 63 years old. Performed at [facility], [address], [place],  [address]     Procedures and diagnostic studies:  CT CHEST W CONTRAST  Result Date: 04/25/2022 CLINICAL DATA:  Esophageal perforation. Recent hiatal hernia surgery, possible Nissen fundoplication. EXAM: CT CHEST WITH CONTRAST TECHNIQUE: Multidetector CT imaging of the chest was performed during intravenous contrast administration. RADIATION DOSE REDUCTION: This exam was performed according to the departmental dose-optimization  program which includes automated exposure control, adjustment of the mA and/or kV according to patient size and/or use of iterative reconstruction technique. CONTRAST:  55m OMNIPAQUE IOHEXOL 300 MG/ML  SOLN COMPARISON:  04/23/2022, 04/20/2022, 04/18/2022 and 09/29/2021 FINDINGS: Cardiovascular: Mild cardiomegaly. Calcified plaque over the left main and 3 vessel coronary arteries. Thoracic aorta is normal in caliber. There  is calcified plaque over the thoracic aorta with stable small focal mural thrombus along the periphery of the descending thoracic aorta. Pulmonary arterial system is unremarkable. Remaining vascular structures are unremarkable. Mediastinum/Nodes: There are a few stable shotty mediastinal lymph nodes present. No mediastinal or hilar adenopathy. Contrast is present within the esophagus as was present on the prior exam. There is evidence of patient's hiatal hernia unchanged from June 2023. Contrast fills the right lateral aspect of this hiatal hernia unchanged from 09/29/2021. Note that contrast was not filling this part of the hernia on the recent prior exam from 04/23/2022. There is moderate subcutaneous edema of the segment of stomach crossing the diaphragm unchanged. No free air. Lungs/Pleura: There is moderate centrilobular emphysematous disease. Stable moderate size bilateral pleural effusions with associated bibasilar compressive atelectasis. Right basilar pleural pigtail drainage catheter unchanged. No pneumothorax. Upper Abdomen: Calcified plaque over the abdominal aorta. Remainder of the visualized upper abdomen is unchanged. Musculoskeletal: No focal abnormality. Degenerative changes of the spine. IMPRESSION: 1. Evidence of patient's hiatal hernia with reported recent surgery involving this hernia. Appearance is not significantly changed from June 2023. Contrast fills the right lateral aspect of this hiatal hernia unchanged from 09/29/2021. Note that contrast was not filling this part of  the hernia on the recent prior exam from 04/23/2022. There is moderate subcutaneous edema of the segment of stomach crossing the diaphragm unchanged from recent prior exams. No free air. Recommend correlation with patient's recent surgical history. 2. Stable moderate size bilateral pleural effusions with associated bibasilar compressive atelectasis. Right basilar pleural pigtail drainage catheter unchanged. No pneumothorax. 3. Emphysema. 4. Aortic atherosclerosis. Atherosclerotic coronary artery disease. Aortic Atherosclerosis (ICD10-I70.0) and Emphysema (ICD10-J43.9). Electronically Signed   By: Marin Olp M.D.   On: 04/25/2022 15:32               LOS: 10 days   Jody Aguinaga  [facility]   Pager on [email or web address]. If 7PM-7AM, please contact night-coverage at [email or web address]     04/26/2022, 4:03 PM

## 2022-04-26 NOTE — TOC Progression Note (Signed)
Transition of Care [facility]) - Progression Note    Patient Details  Name: Kelly Wood MRN: [id number] Date of Birth: Feb 15, 1941  Transition of Care [facility]) CM/SW [place], [place] Phone Number: 04/26/2022, 9:46 AM  Clinical Narrative:     CSW notes CIR following once patient is more medically stable.    Expected Discharge Plan: [place] Barriers to Discharge: Continued Medical Work up  Expected Discharge Plan and Services   Discharge Planning Services: CM Consult Post Acute Care Choice: [place] arrangements for the past 2 months: Single Family Home                           HH Arranged: RN, PT, OT [facility] Agency: [place] ([place]) Date HH Agency Contacted: 04/20/22 Time [place]: 1104 Representative spoke with at [place] [employer]: [place] ([place]) Interventions SDOH Screenings   Food Insecurity: No Food Insecurity (04/15/2022)  Housing: Low Risk  (04/15/2022)  Transportation Needs: No Transportation Needs (04/15/2022)  Utilities: Not At Risk (04/15/2022)  Tobacco Use: Medium Risk (04/20/2022)    Readmission Risk Interventions     No data to display

## 2022-04-27 ENCOUNTER — Inpatient Hospital Stay: Payer: Medicare HMO

## 2022-04-27 DIAGNOSIS — Z9889 Other specified postprocedural states: Secondary | ICD-10-CM | POA: Diagnosis not present

## 2022-04-27 DIAGNOSIS — I4719 Other supraventricular tachycardia: Secondary | ICD-10-CM | POA: Diagnosis not present

## 2022-04-27 DIAGNOSIS — Z8719 Personal history of other diseases of the digestive system: Secondary | ICD-10-CM | POA: Diagnosis not present

## 2022-04-27 LAB — COMPREHENSIVE METABOLIC PANEL
ALT: 36 U/L (ref 0–44)
AST: 32 U/L (ref 15–41)
Albumin: 2.1 g/dL — ABNORMAL LOW (ref 3.5–5.0)
Alkaline Phosphatase: 171 U/L — ABNORMAL HIGH (ref 38–126)
Anion gap: 10 (ref 5–15)
BUN: 19 mg/dL (ref 8–23)
CO2: 27 mmol/L (ref 22–32)
Calcium: 7.8 mg/dL — ABNORMAL LOW (ref 8.9–10.3)
Chloride: 93 mmol/L — ABNORMAL LOW (ref 98–111)
Creatinine, Ser: 0.56 mg/dL (ref 0.44–1.00)
GFR, Estimated: >60 mL/min (ref >60)
Glucose, Bld: 100 mg/dL — ABNORMAL HIGH (ref 70–99)
Potassium: 3.9 mmol/L (ref 3.5–5.1)
Sodium: 130 mmol/L — ABNORMAL LOW (ref 135–145)
Total Bilirubin: 0.5 mg/dL (ref 0.3–1.2)
Total Protein: 6.1 g/dL — ABNORMAL LOW (ref 6.5–8.1)

## 2022-04-27 LAB — CBC
HCT: 29.5 % — ABNORMAL LOW (ref 36.0–46.0)
Hemoglobin: 9.4 g/dL — ABNORMAL LOW (ref 12.0–15.0)
MCH: 26.9 pg (ref 26.0–34.0)
MCHC: 31.9 g/dL (ref 30.0–36.0)
MCV: 84.5 fL (ref 80.0–100.0)
Platelets: 644 10*3/uL — ABNORMAL HIGH (ref 150–400)
RBC: 3.49 MIL/uL — ABNORMAL LOW (ref 3.87–5.11)
RDW: 15.6 % — ABNORMAL HIGH (ref 11.5–15.5)
WBC: 24.3 10*3/uL — ABNORMAL HIGH (ref 4.0–10.5)
nRBC: 0 % (ref 0.0–0.2)

## 2022-04-27 LAB — BODY FLUID CELL COUNT WITH DIFFERENTIAL
Eos, Fluid: 0 %
Lymphs, Fluid: 61 %
Monocyte-Macrophage-Serous Fluid: 0 %
Neutrophil Count, Fluid: 39 %
Total Nucleated Cell Count, Fluid: 161 cu mm

## 2022-04-27 LAB — GLUCOSE, CAPILLARY
Glucose-Capillary: 105 mg/dL — ABNORMAL HIGH (ref 70–99)
Glucose-Capillary: 106 mg/dL — ABNORMAL HIGH (ref 70–99)
Glucose-Capillary: 112 mg/dL — ABNORMAL HIGH (ref 70–99)
Glucose-Capillary: 72 mg/dL (ref 70–99)
Glucose-Capillary: 77 mg/dL (ref 70–99)
Glucose-Capillary: 94 mg/dL (ref 70–99)

## 2022-04-27 LAB — MRSA NEXT GEN BY PCR, NASAL: MRSA by PCR Next Gen: NOT DETECTED

## 2022-04-27 MED ORDER — OSMOLITE 1.2 CAL PO LIQD
1000.0000 mL | ORAL | Status: DC
  Administered 2022-04-27: 1000 mL

## 2022-04-27 MED ORDER — LEVOTHYROXINE SODIUM 50 MCG PO TABS
50.0000 ug | ORAL_TABLET | Freq: Every day | ORAL | Status: DC
  Administered 2022-04-28: 50 ug
  Filled 2022-04-27: qty 1

## 2022-04-27 NOTE — Progress Notes (Addendum)
Progress Note    Kelly Wood  TKW:[id number] DOB: Apr 14, 1940  DOA: 04/15/2022 PCP: Idelle Crouch, MD      Brief Narrative:    Medical records reviewed and are as summarized below:  Kelly Wood is a 82 y.o. female with PMH of HTN, HLD, hypothyroid, COPD, anemia, GERD, s/p repair of paraesophageal hernia robotically approximately 11 months ago, presented to ED on 04/15/2022 with persistent bloating, eructation and chest discomfort.  She was diagnosed with symptomatic recurrent paraesophageal hernia.  Admitted by the surgical team.  She underwent robotic assisted laparoscopic repair of paraesophageal hernia with bio mesh and Nissen fundoplication, redo on 9/24 which was complicated by esophageal perforation, right pneumothorax requiring right chest tube placement, robotic assisted repair of esophageal perforation, jejunostomy tube, mediastinal Blake drain placement.  TRH consulted 1/12 for management of medical issues.  1/14: New onset narrow complex tachycardia, SVT, transferred to stepdown.  Cardiology consulted.  Started on IV amiodarone and maintaining SR.        Assessment/Plan:   Principal Problem:   S/P repair of paraesophageal hernia Active Problems:   Esophageal perforation   SVT (supraventricular tachycardia)   Atrial tachycardia   Hiatal hernia   Nutrition Problem: Increased nutrient needs Etiology: post-op healing  Signs/Symptoms: estimated needs   Body mass index is 25.92 kg/m.   SVT/paroxysmal atrial tachycardia: Heart rate went up into the 150s this morning.  She is no longer on IV amiodarone. 2D echo showed EF estimated at 55 to 26%, grade 1 diastolic dysfunction, moderately elevated pulmonary artery systolic pressure  Delirium, intermittent confusion: Improved.  Avoid benzodiazepines because patient reportedly had a paradoxical response to IV Ativan.  Minimize opioids and sedating medications.   S/p robotic assisted laparoscopic  paraesophageal hernia repair 8/34, complicated by esophageal leak requiring immediate takeback for robotic assisted primary repair of esophageal perforation, placement of mediastinal drain/right chest tube/feeding jejunostomy tube, leukocytosis, concern for infected right pleural fluid: WBC is trending down.  He is on IV Zosyn, IV fluconazole.  Follow-up with general surgeon and pulmonologist.  Plan to transfer to [facility] for further management   Hypokalemia: Improved  Hyponatremia: Sodium level is stable.  Continue to monitor.  Other comorbidities include hyperlipidemia, hypothyroidism, hypertension, chronic anemia   Diet Order             Diet NPO time specified Except for: Ice Chips, Other (See Comments)  Diet effective now                            Consultants: Cardiologist General surgeon Pulmonologist  Procedures:  Right chest tube placement Robotic assisted primary repair of esophageal perforation Robotic assisted jejunostomy tube Robotic mediastinal blake drain placement Removal of BioA mesh Omental flap as tissue buttressing        Medications:    Chlorhexidine Gluconate Cloth  6 each Topical Daily   Chlorhexidine Gluconate Cloth  6 each Topical Q0600   enoxaparin (LOVENOX) injection  40 mg Subcutaneous Q24H   fiber supplement (BANATROL TF)  60 mL Per Tube BID   free water  30 mL Per Tube Q4H   furosemide  40 mg Intravenous BID   levothyroxine  50 mcg Per Tube Q0600   metoprolol tartrate  25 mg Per Tube BID   pantoprazole (PROTONIX) IV  40 mg Intravenous Q12H   Continuous Infusions:  sodium chloride     albumin human 12.5 g (04/27/22 0900)  feeding supplement (OSMOLITE 1.2 CAL) 1,000 mL (04/27/22 1027)   fluconazole (DIFLUCAN) IV     piperacillin-tazobactam (ZOSYN)  IV 3.375 g (04/27/22 1344)     Anti-infectives (From admission, onward)    Start     Dose/Rate Route Frequency Ordered Stop   04/27/22 1800  fluconazole (DIFLUCAN)  IVPB 400 mg        400 mg 100 mL/hr over 120 Minutes Intravenous Every 24 hours 04/26/22 1639     04/26/22 1800  fluconazole (DIFLUCAN) IVPB 800 mg        800 mg 100 mL/hr over 240 Minutes Intravenous  Once 04/26/22 1639 04/26/22 2212   04/15/22 1730  piperacillin-tazobactam (ZOSYN) IVPB 3.375 g  Status:  Discontinued        3.375 g 100 mL/hr over 30 Minutes Intravenous Every 8 hours 04/15/22 1716 04/15/22 1719   04/15/22 1730  piperacillin-tazobactam (ZOSYN) IVPB 3.375 g        3.375 g 12.5 mL/hr over 240 Minutes Intravenous Every 8 hours 04/15/22 1719 04/29/22 2359   04/15/22 1730  piperacillin-tazobactam (ZOSYN) 3.375 GM/50ML IVPB       Note to Pharmacy: Rutherford Nail E: cabinet override      04/15/22 1730 04/16/22 0544   04/15/22 1445  ceFAZolin (ANCEF) IVPB 2g/100 mL premix  Status:  Discontinued        2 g 200 mL/hr over 30 Minutes Intravenous Every 8 hours 04/15/22 1435 04/15/22 1719   04/15/22 0645  ceFAZolin (ANCEF) IVPB 2g/100 mL premix        2 g 200 mL/hr over 30 Minutes Intravenous On call to O.R. 04/15/22 7673 04/15/22 0806              Family Communication/Anticipated D/C date and plan/Code Status   DVT prophylaxis: enoxaparin (LOVENOX) injection 40 mg Start: 04/16/22 0800 SCDs Start: 04/15/22 1132     Code Status: Full Code  Family Communication: Husband at the bedside Disposition Plan: To be determined by general surgeon       Subjective:   She has no complaints.  No nausea or vomiting.  Her husband was at the door  Objective:    Vitals:   04/27/22 0045 04/27/22 0330 04/27/22 0900 04/27/22 1300  BP: (!) 153/88 (!) 145/78 (!) 140/76 (!) 145/85  Pulse: 75 74 76 80  Resp: '16 19  18  '$ Temp: 98.4 F (36.9 C) 98.3 F (36.8 C) 98.2 F (36.8 C) 97.8 F (36.6 C)  TempSrc: Oral Axillary Oral Axillary  SpO2: 99% 100% 100% 100%  Weight:      Height:       No data found.   Intake/Output Summary (Last 24 hours) at 04/27/2022 1356 Last data  filed at 04/27/2022 1143 Gross per 24 hour  Intake 1975.91 ml  Output 1925 ml  Net 50.91 ml   Filed Weights   04/18/22 0500 04/20/22 0334 04/24/22 0345  Weight: 62.5 kg 62.5 kg 60.2 kg    Exam:  GEN: NAD SKIN: Warm and dry EYES: Anicteric, no pallor ENT: MMM CV: RRR PULM: CTA B, right-sided chest tube in place ABD: soft, ND, NT, +BS, dressing on midline abdominal wound is clean, dry and intact, + jejunostomy tube in place CNS: AAO x 3, non focal EXT: No edema or tenderness    Data Reviewed:   I have personally reviewed following labs and imaging studies:  Labs: Labs show the following:   Basic Metabolic Panel: Recent Labs  Lab 04/21/22 0436 04/22/22 0558 04/23/22 0526  04/25/22 0548 04/26/22 0300 04/27/22 0345  NA 131* 128* 130* 131* 129* 130*  K 3.4* 3.5 3.6 4.7 4.7 3.9  CL 98 94* 95* 97* 95* 93*  CO2 '26 26 26 25 26 27  '$ GLUCOSE 129* 165* 150* 113* 132* 100*  BUN '16 20 19 19 19 19  '$ CREATININE 0.52 0.42* 0.58 0.49 0.50 0.56  CALCIUM 7.7* 7.4* 7.5* 7.5* 7.7* 7.8*  MG 2.0 2.0  --  2.2  --   --   PHOS 3.9 3.7  --   --   --   --    GFR Estimated Creatinine Clearance: 46.3 mL/min (by C-G formula based on SCr of 0.56 mg/dL). Liver Function Tests: Recent Labs  Lab 04/25/22 0548 04/26/22 0300 04/27/22 0345  AST 43* 38 32  ALT 41 44 36  ALKPHOS 199* 184* 171*  BILITOT <0.1* 0.4 0.5  PROT 5.8* 6.0* 6.1*  ALBUMIN 1.8* 1.8* 2.1*   No results for input(s): "LIPASE", "AMYLASE" in the last 168 hours. No results for input(s): "AMMONIA" in the last 168 hours. Coagulation profile No results for input(s): "INR", "PROTIME" in the last 168 hours.  CBC: Recent Labs  Lab 04/21/22 0436 04/23/22 0526 04/25/22 0854 04/26/22 0300 04/27/22 0345  WBC 15.4* 18.5* 25.5* 28.4* 24.3*  HGB 10.1* 10.3* 9.6* 9.8* 9.4*  HCT 31.4* 31.6* 30.3* 30.9* 29.5*  MCV 83.7 82.9 84.9 84.2 84.5  PLT 330 416* 515* 640* 644*   Cardiac Enzymes: No results for input(s): "CKTOTAL",  "CKMB", "CKMBINDEX", "TROPONINI" in the last 168 hours. BNP (last 3 results) No results for input(s): "PROBNP" in the last 8760 hours. CBG: Recent Labs  Lab 04/26/22 2000 04/27/22 0053 04/27/22 0328 04/27/22 0720 04/27/22 1138  GLUCAP 99 94 77 105* 106*   D-Dimer: No results for input(s): "DDIMER" in the last 72 hours. Hgb A1c: No results for input(s): "HGBA1C" in the last 72 hours. Lipid Profile: No results for input(s): "CHOL", "HDL", "LDLCALC", "TRIG", "CHOLHDL", "LDLDIRECT" in the last 72 hours. Thyroid function studies: No results for input(s): "TSH", "T4TOTAL", "T3FREE", "THYROIDAB" in the last 72 hours.  Invalid input(s): "FREET3" Anemia work up: No results for input(s): "VITAMINB12", "FOLATE", "FERRITIN", "TIBC", "IRON", "RETICCTPCT" in the last 72 hours. Sepsis Labs: Recent Labs  Lab 04/23/22 0526 04/25/22 0854 04/26/22 0300 04/27/22 0345  WBC 18.5* 25.5* 28.4* 24.3*    Microbiology Recent Results (from the past 240 hour(s))  MRSA Next Gen by PCR, Nasal     Status: None   Collection Time: 04/18/22  9:03 AM   Specimen: Nasal Mucosa; Nasal Swab  Result Value Ref Range Status   MRSA by PCR Next Gen NOT DETECTED NOT DETECTED Final    Comment: (NOTE) The GeneXpert MRSA Assay (FDA approved for NASAL specimens only), is one component of a comprehensive MRSA colonization surveillance program. It is not intended to diagnose MRSA infection nor to guide or monitor treatment for MRSA infections. Test performance is not FDA approved in patients less than 62 years old. Performed at [facility], [address] [place], [place] [address]   MRSA Next Gen by PCR, Nasal     Status: None   Collection Time: 04/26/22  9:00 AM   Specimen: Nasal Mucosa; Nasal Swab  Result Value Ref Range Status   MRSA by PCR Next Gen NOT DETECTED NOT DETECTED Final    Comment: (NOTE) The GeneXpert MRSA Assay (FDA approved for NASAL specimens only), is one component of a  comprehensive MRSA colonization surveillance program. It is  not intended to diagnose MRSA infection nor to guide or monitor treatment for MRSA infections. Test performance is not FDA approved in patients less than 104 years old. Performed at [facility], [address] [place], [place] [address]     Procedures and diagnostic studies:  [place] EKG SITE RITE  Result Date: 04/26/2022 If [facility] image not attached, placement could not be confirmed due to current cardiac rhythm.  CT CHEST W CONTRAST  Result Date: 04/25/2022 CLINICAL DATA:  Esophageal perforation. Recent hiatal hernia surgery, possible Nissen fundoplication. EXAM: CT CHEST WITH CONTRAST TECHNIQUE: Multidetector CT imaging of the chest was performed during intravenous contrast administration. RADIATION DOSE REDUCTION: This exam was performed according to the departmental dose-optimization program which includes automated exposure control, adjustment of the mA and/or kV according to patient size and/or use of iterative reconstruction technique. CONTRAST:  70m OMNIPAQUE IOHEXOL 300 MG/ML  SOLN COMPARISON:  04/23/2022, 04/20/2022, 04/18/2022 and 09/29/2021 FINDINGS: Cardiovascular: Mild cardiomegaly. Calcified plaque over the left main and 3 vessel coronary arteries. Thoracic aorta is normal in caliber. There is calcified plaque over the thoracic aorta with stable small focal mural thrombus along the periphery of the descending thoracic aorta. Pulmonary arterial system is unremarkable. Remaining vascular structures are unremarkable. Mediastinum/Nodes: There are a few stable shotty mediastinal lymph nodes present. No mediastinal or hilar adenopathy. Contrast is present within the esophagus as was present on the prior exam. There is evidence of patient's hiatal hernia unchanged from June 2023. Contrast fills the right lateral aspect of this hiatal hernia unchanged from 09/29/2021. Note that contrast was not filling this part of the  hernia on the recent prior exam from 04/23/2022. There is moderate subcutaneous edema of the segment of stomach crossing the diaphragm unchanged. No free air. Lungs/Pleura: There is moderate centrilobular emphysematous disease. Stable moderate size bilateral pleural effusions with associated bibasilar compressive atelectasis. Right basilar pleural pigtail drainage catheter unchanged. No pneumothorax. Upper Abdomen: Calcified plaque over the abdominal aorta. Remainder of the visualized upper abdomen is unchanged. Musculoskeletal: No focal abnormality. Degenerative changes of the spine. IMPRESSION: 1. Evidence of patient's hiatal hernia with reported recent surgery involving this hernia. Appearance is not significantly changed from June 2023. Contrast fills the right lateral aspect of this hiatal hernia unchanged from 09/29/2021. Note that contrast was not filling this part of the hernia on the recent prior exam from 04/23/2022. There is moderate subcutaneous edema of the segment of stomach crossing the diaphragm unchanged from recent prior exams. No free air. Recommend correlation with patient's recent surgical history. 2. Stable moderate size bilateral pleural effusions with associated bibasilar compressive atelectasis. Right basilar pleural pigtail drainage catheter unchanged. No pneumothorax. 3. Emphysema. 4. Aortic atherosclerosis. Atherosclerotic coronary artery disease. Aortic Atherosclerosis (ICD10-I70.0) and Emphysema (ICD10-J43.9). Electronically Signed   By: DMarin OlpM.D.   On: 04/25/2022 15:32               LOS: 11 days   Boysie Bonebrake  [facility]   Pager on [email or web address] If 7PM-7AM, please contact night-coverage at [email or web address]     04/27/2022, 1:56 PM

## 2022-04-27 NOTE — Progress Notes (Signed)
Nutrition Follow-up  DOCUMENTATION CODES:   Not applicable  INTERVENTION:   -TF via j-tube:    Advance feeds per MD Osmolite 1.2 @ 30 ml/hr   30 ml free water flush every 4 hours   Once medically able, increase by 10 ml every 12 hours to goal rate of 60 ml/hr.    80 ml free water flush every 4 hours   Tube feeding regimen provides 1728 kcal (100% of needs), 80 grams of protein, and 1181 ml of H2O.  Total free water: 1661 ml daily   -60 ml Banatrol BID  NUTRITION DIAGNOSIS:   Increased nutrient needs related to post-op healing as evidenced by estimated needs.  Ongoing  GOAL:   Patient will meet greater than or equal to 90% of their needs  Progressing   MONITOR:   Diet advancement, Labs, Weight trends, TF tolerance, I & O's, Skin  REASON FOR ASSESSMENT:   Consult Enteral/tube feeding initiation and management, Assessment of nutrition requirement/status  ASSESSMENT:   82 y/o female with h/o GERD, HTN, shingles, thyroid disease, HTN, hiatal hernia s/p Nissen fundiplocation and paraesophageal hernia repair 04/07/2021, COPD, B12 deficiency, IDA, hip fracture s/p repair 2020 and lumbar spondlyosis who is admitted with recurrent hiatal hernia s/p robotic assisted laparoscopic repair of  paraesophageal hernia with Bio-A Mesh and Nissen fundoplication, redo 1/61 complicated by esophageal perforation now s/p robotic assisted primary repair of esophageal perforation 1/11 (with chest tube placement, jejunostomy tube placement, blake drain placement, removal of BioA mesh and omental flap).  Reviewed I/O's: +81 ml x 24 hours and +17 L since admission  UOP: 1.8 L x 24 hours  Drain output: 85 ml x 24 hours   RD consulted for concern for malnutrition, due to inadequate nutritional intake over the past week.   Case discussed with general surgery; plan to increase TF to 30 ml/hr.   Spoke with pt and husband at bedside. Husband reports "today is a better day". Pt shares that she  just worked with therapy and feels good. Pt denies any nausea, vomiting, or abdominal pain. Pt receiving Osmolite 1.2 @ 30 ml/hr currently.   Reviewed plan of care with pt and husband. Plan to eventually transfer to CIR once medically stable.   Reviewed wt, which has been stable over the past week.   Medications reviewed and include diflucan.   Labs reviewed: Na: 130, CBGS: 77-106 (inpatient orders for glycemic control are none).    NUTRITION - FOCUSED PHYSICAL EXAM:  Flowsheet Row Most Recent Value  Orbital Region No depletion  Upper Arm Region Moderate depletion  Thoracic and Lumbar Region No depletion  Buccal Region No depletion  Temple Region No depletion  Clavicle Bone Region No depletion  Clavicle and Acromion Bone Region No depletion  Scapular Bone Region Mild depletion  Dorsal Hand Mild depletion  Patellar Region Mild depletion  Anterior Thigh Region Mild depletion  Posterior Calf Region Mild depletion  Edema (RD Assessment) None  Hair Reviewed  Eyes Reviewed  Mouth Reviewed  Skin Reviewed  Nails Reviewed       Diet Order:   Diet Order             Diet NPO time specified Except for: Ice Chips, Other (See Comments)  Diet effective now                   EDUCATION NEEDS:   Education needs have been addressed  Skin:  Skin Assessment: Skin Integrity Issues: Skin Integrity Issues:: Incisions Incisions:  closed abdomen, s/p j-tube placement  Last BM:  04/27/22 (type 7)  Height:   Ht Readings from Last 1 Encounters:  04/15/22 5' (1.524 m)    Weight:   Wt Readings from Last 1 Encounters:  04/24/22 60.2 kg    Ideal Body Weight:  45.5 kg  BMI:  Body mass index is 25.92 kg/m.  Estimated Nutritional Needs:   Kcal:  1650-1850  Protein:  85-100 grams  Fluid:  > 1.6 L    Loistine Chance, RD, LDN, [place] Registered Dietitian II Certified Diabetes Care and Education Specialist Please refer to [facility] for RD and/or RD on-call/weekend/after hours pager

## 2022-04-27 NOTE — Progress Notes (Addendum)
[place] Hospital Day(s): 11.   Post op day(s): 12 Days Post-Op.   Interval History:  Patient seen and examined No issues overnight HR better controlled last 24 hours Somewhat depressed this morning; very quite No fever, chills, nausea, emesis Leukocytosis improved some this AM; now 24.3K Hgb stable at 9.4 Renal function normal; sCr - 0.56; UO - 1200 ccs Stable hyponatremia to 130 Drain present; now intra-abdominal; 85 ccs; output more serosanguinous Chest tube present; right; ~10 ccs recorded last 24 hours; serous; no air leak NPO this morning  Continues on Zosyn  Pending potential transfer to [facility]; on waiting list   Vital signs in last 24 hours: [min-max] current  Temp:  [97.6 F (36.4 C)-99 F (37.2 C)] 98.3 F (36.8 C) (01/23 0330) Pulse Rate:  [74-79] 74 (01/23 0330) Resp:  [14-19] 19 (01/23 0330) BP: (124-177)/(78-90) 145/78 (01/23 0330) SpO2:  [96 %-100 %] 100 % (01/23 0330)     Height: 5' (152.4 cm) Weight: 60.2 kg BMI (Calculated): 25.92   Intake/Output last 2 shifts:  01/22 0701 - 01/23 0700 In: 1975.9 [NG/GT:1727.3; IV Piggyback:248.6] Out: 1295 [Urine:1200; Drains:85; Chest Tube:10]   Physical Exam:  Constitutional: alert, cooperative and no distress  Chest: Right sided chest tube; site CDI; minimal serous output; no air leak Respiratory: breathing non-labored at rest; on [place] Cardiovascular: regular rate and sinus rhythm  Gastrointestinal: soft, non-tender, and non-distended, no rebound/guarding. Mediastinal drain; minimal serous output Integumentary: Laparoscopic incisions are CDI; no erythema  Labs:     Latest Ref Rng & Units 04/27/2022    3:45 AM 04/26/2022    3:00 AM 04/25/2022    8:54 AM  CBC  WBC 4.0 - 10.5 K/uL 24.3  28.4  25.5   Hemoglobin 12.0 - 15.0 g/dL 9.4  9.8  9.6   Hematocrit 36.0 - 46.0 % 29.5  30.9  30.3   Platelets 150 - 400 K/uL 644  640  515       Latest Ref Rng & Units 04/27/2022    3:45  AM 04/26/2022    3:00 AM 04/25/2022    5:48 AM  CMP  Glucose 70 - 99 mg/dL 100  132  113   BUN 8 - 23 mg/dL '19  19  19   '$ Creatinine 0.44 - 1.00 mg/dL 0.56  0.50  0.49   Sodium 135 - 145 mmol/L 130  129  131   Potassium 3.5 - 5.1 mmol/L 3.9  4.7  4.7   Chloride 98 - 111 mmol/L 93  95  97   CO2 22 - 32 mmol/L '27  26  25   '$ Calcium 8.9 - 10.3 mg/dL 7.8  7.7  7.5   Total Protein 6.5 - 8.1 g/dL 6.1  6.0  5.8   Total Bilirubin 0.3 - 1.2 mg/dL 0.5  0.4  <0.1   Alkaline Phos 38 - 126 U/L 171  184  199   AST 15 - 41 U/L 32  38  43   ALT 0 - 44 U/L 36  44  41     Imaging studies: No new pertinent imaging studies   Assessment/Plan:  82 y.o. female 12 Days Post-Op s/p robotic assisted laparoscopic paraesophageal hernia repair complicated by esophageal leak requiring immediate take back for robotic assisted primary repair of esophageal perforation, placement of mediastinal drain, feeding jejunostomy placement, and right chest tube placements.    - Discussed PICC further with patient and her husband; they still wish to think  about this  - Continue NPO status; did poorly with PO intake  - Continue enteric feeding via jejunostomy for now; okay to advance; try 30 ml/hr; liquids only; NO PILLS via jejunostomy  - Continue chest tube; monitor and record output - Continue Blake drain; now intra-abdominal; monitor and record output  - Continue IV Abx (Zosyn)   - Continue antifungal (Fluconazole)  - Monitor abdominal examination - Pain control prn; antiemetics prn - Can mobilize with therapies; current recommendations are inpatient rehab - Appreciate medicine/PCCM for assistance   - Dr Dahlia Byes has reviewed case with thoracic surgery (Dr Laverta Baltimore) at [facility]; accepted and on wait-list. I called again this AM for update; still waiting on bed availability  All of the above findings and recommendations were discussed with the patient, patient's family, and the medical team, and all of patient's and family's questions  were answered to their expressed satisfaction.  -- Edison Simon, PA-C [facility] 04/27/2022, 7:23 AM M-F: 7am - 4pm

## 2022-04-27 NOTE — Progress Notes (Signed)
Physical Therapy Treatment Patient Details Name: Kelly Wood MRN: [id number] DOB: 02-20-1941 Today's Date: 04/27/2022   History of Present Illness Pt is a 82 year old female  52 Days Post-Op s/p robotic assisted laparoscopic paraesophageal hernia repair complicated by esophageal leak requiring immediate take back for robotic assisted primary repair of esophageal perforation, placement of mediastinal drain, feeding jejunostomy placement, and right chest tube placements; On 1/14 1/14: New onset narrow complex tachycardia, SVT, transferred to stepdown.  Cardiology consulted.  Started on IV amiodarone; PMH significant for  HTN, HLD, hypothyroid, COPD, anemia, GERD, s/p repair of paraesophageal hernia robotically approximately 11 months ago    PT Comments    Pt is making gradual progress towards goals, however still limited by medical status. Attempted to wean off 2L with sats decreasing sharply to 85% on RA with exertion. Replaced 2L for mobility tasks. Pt continues to be motivated to participate, however fatigues quickly. Encouraged continued supine there-ex. Will continue to progress as able.  Recommendations for follow up therapy are one component of a multi-disciplinary discharge planning process, led by the attending physician.  Recommendations may be updated based on patient status, additional functional criteria and insurance authorization.  Follow Up Recommendations  Acute inpatient rehab (3hours/day)     Assistance Recommended at Discharge Intermittent Supervision/Assistance  Patient can return home with the following A little help with walking and/or transfers;A little help with bathing/dressing/bathroom;Assist for transportation;Help with stairs or ramp for entrance;Assistance with cooking/housework   Equipment Recommendations  None recommended by PT    Recommendations for Other Services       Precautions / Restrictions Precautions Precautions: Fall Precaution Comments: R chest  tube, JP drain, G-tube Restrictions Weight Bearing Restrictions: No     Mobility  Bed Mobility Overal bed mobility: Needs Assistance Bed Mobility: Rolling, Supine to Sit Rolling: Min assist   Supine to sit: Mod assist Sit to supine: Mod assist   General bed mobility comments: educated in log rolling for abdominal protection. Needs cues for sequencing and hand placement. Physical assist required for B LEs.    Transfers Overall transfer level: Needs assistance Equipment used: Rolling walker (2 wheels) Transfers: Sit to/from Stand, Bed to chair/wheelchair/BSC Sit to Stand: Min assist           General transfer comment: very effortful. Once standing, upright posture noted    Ambulation/Gait Ambulation/Gait assistance: Min assist Gait Distance (Feet): 10 Feet Assistive device: Rolling walker (2 wheels) Gait Pattern/deviations: Trunk flexed, Step-through pattern, Decreased step length - right, Decreased step length - left       General Gait Details: slow cadence with ability to take 5' forward/back x 2 and then seated rest break. LImited by lines/leads including chest tube to suction. O2 sats at 97% and HR at 139bpm post exertion.   Stairs             Wheelchair Mobility    Modified Rankin (Stroke Patients Only)       Balance Overall balance assessment: Needs assistance Sitting-balance support: Feet supported Sitting balance-Leahy Scale: Good     Standing balance support: Bilateral upper extremity supported, During functional activity Standing balance-Leahy Scale: Fair                              Cognition Arousal/Alertness: Awake/alert Behavior During Therapy: WFL for tasks assessed/performed Overall Cognitive Status: Within Functional Limits for tasks assessed  General Comments: cooperative and agreeable to session        Exercises Other Exercises Other Exercises: reveiwed HEP and pt  encouraged to continue to progress throughout day. Other Exercises: Pt with incontient BM with time provided for rolling, linen change, and hygiene.    General Comments        Pertinent Vitals/Pain Pain Assessment Pain Assessment: Faces Faces Pain Scale: Hurts a little bit Pain Location: abdominal Pain Descriptors / Indicators: Sore, Discomfort Pain Intervention(s): Limited activity within patient's tolerance, Repositioned    Home Living                          Prior Function            PT Goals (current goals can now be found in the care plan section) Acute Rehab PT Goals Patient Stated Goal: to go home PT Goal Formulation: With patient/family Time For Goal Achievement: 05/04/22 Potential to Achieve Goals: Good Progress towards PT goals: Progressing toward goals    Frequency    Min 2X/week      PT Plan Current plan remains appropriate    Co-evaluation              AM-PAC PT "6 Clicks" Mobility   Outcome Measure  Help needed turning from your back to your side while in a flat bed without using bedrails?: A Little Help needed moving from lying on your back to sitting on the side of a flat bed without using bedrails?: A Little Help needed moving to and from a bed to a chair (including a wheelchair)?: A Little Help needed standing up from a chair using your arms (e.g., wheelchair or bedside chair)?: A Little Help needed to walk in hospital room?: A Little Help needed climbing 3-5 steps with a railing? : A Lot 6 Click Score: 17    End of Session Equipment Utilized During Treatment: Oxygen Activity Tolerance: Patient tolerated treatment well Patient left: in bed;with bed alarm set;with call bell/phone within reach;with family/visitor present Nurse Communication: Mobility status PT Visit Diagnosis: Unsteadiness on feet (R26.81);Muscle weakness (generalized) (M62.81);Pain Pain - Right/Left:  (abdominal) Pain - part of body:  (abdominal)      Time: 4536-4680 PT Time Calculation (min) (ACUTE ONLY): 46 min  Charges:  $Gait Training: 8-22 mins $Therapeutic Activity: 23-37 mins                     Greggory Stallion, PT, DPT, [facility] [phone]    Kelly Wood 04/27/2022, 11:16 AM

## 2022-04-27 NOTE — Progress Notes (Signed)
Pt resting in bed. Tube feeding infusing at 3m/hr. No sign of acute distress noted at this time.

## 2022-04-28 ENCOUNTER — Inpatient Hospital Stay: Payer: Self-pay

## 2022-04-28 ENCOUNTER — Inpatient Hospital Stay: Payer: Medicare HMO

## 2022-04-28 DIAGNOSIS — Z8719 Personal history of other diseases of the digestive system: Secondary | ICD-10-CM | POA: Diagnosis not present

## 2022-04-28 DIAGNOSIS — Z9889 Other specified postprocedural states: Secondary | ICD-10-CM | POA: Diagnosis not present

## 2022-04-28 DIAGNOSIS — I471 Supraventricular tachycardia, unspecified: Secondary | ICD-10-CM | POA: Diagnosis not present

## 2022-04-28 LAB — CBC WITH DIFFERENTIAL/PLATELET
Abs Immature Granulocytes: 0.78 10*3/uL — ABNORMAL HIGH (ref 0.00–0.07)
Basophils Absolute: 0.1 10*3/uL (ref 0.0–0.1)
Basophils Relative: 1 %
Eosinophils Absolute: 0.2 10*3/uL (ref 0.0–0.5)
Eosinophils Relative: 1 %
HCT: 29.5 % — ABNORMAL LOW (ref 36.0–46.0)
Hemoglobin: 9.6 g/dL — ABNORMAL LOW (ref 12.0–15.0)
Immature Granulocytes: 4 %
Lymphocytes Relative: 10 %
Lymphs Abs: 2.3 10*3/uL (ref 0.7–4.0)
MCH: 27 pg (ref 26.0–34.0)
MCHC: 32.5 g/dL (ref 30.0–36.0)
MCV: 82.9 fL (ref 80.0–100.0)
Monocytes Absolute: 1.9 10*3/uL — ABNORMAL HIGH (ref 0.1–1.0)
Monocytes Relative: 8 %
Neutro Abs: 17.2 10*3/uL — ABNORMAL HIGH (ref 1.7–7.7)
Neutrophils Relative %: 76 %
Platelets: 839 10*3/uL — ABNORMAL HIGH (ref 150–400)
RBC: 3.56 MIL/uL — ABNORMAL LOW (ref 3.87–5.11)
RDW: 15.4 % (ref 11.5–15.5)
WBC: 22.5 10*3/uL — ABNORMAL HIGH (ref 4.0–10.5)
nRBC: 0 % (ref 0.0–0.2)

## 2022-04-28 LAB — COMPREHENSIVE METABOLIC PANEL
ALT: 28 U/L (ref 0–44)
AST: 25 U/L (ref 15–41)
Albumin: 2.2 g/dL — ABNORMAL LOW (ref 3.5–5.0)
Alkaline Phosphatase: 182 U/L — ABNORMAL HIGH (ref 38–126)
Anion gap: 12 (ref 5–15)
BUN: 23 mg/dL (ref 8–23)
CO2: 25 mmol/L (ref 22–32)
Calcium: 7.9 mg/dL — ABNORMAL LOW (ref 8.9–10.3)
Chloride: 95 mmol/L — ABNORMAL LOW (ref 98–111)
Creatinine, Ser: 0.77 mg/dL (ref 0.44–1.00)
GFR, Estimated: >60 mL/min (ref >60)
Glucose, Bld: 122 mg/dL — ABNORMAL HIGH (ref 70–99)
Potassium: 4.1 mmol/L (ref 3.5–5.1)
Sodium: 132 mmol/L — ABNORMAL LOW (ref 135–145)
Total Bilirubin: 0.6 mg/dL (ref 0.3–1.2)
Total Protein: 6.5 g/dL (ref 6.5–8.1)

## 2022-04-28 LAB — GLUCOSE, CAPILLARY
Glucose-Capillary: 113 mg/dL — ABNORMAL HIGH (ref 70–99)
Glucose-Capillary: 115 mg/dL — ABNORMAL HIGH (ref 70–99)
Glucose-Capillary: 117 mg/dL — ABNORMAL HIGH (ref 70–99)
Glucose-Capillary: 117 mg/dL — ABNORMAL HIGH (ref 70–99)
Glucose-Capillary: 119 mg/dL — ABNORMAL HIGH (ref 70–99)
Glucose-Capillary: 128 mg/dL — ABNORMAL HIGH (ref 70–99)

## 2022-04-28 LAB — PHOSPHORUS: Phosphorus: 5.8 mg/dL — ABNORMAL HIGH (ref 2.5–4.6)

## 2022-04-28 LAB — PATHOLOGIST SMEAR REVIEW

## 2022-04-28 LAB — MAGNESIUM: Magnesium: 2.2 mg/dL (ref 1.7–2.4)

## 2022-04-28 MED ORDER — SODIUM CHLORIDE 0.9% FLUSH: 10.0000 mL | Freq: Two times a day (BID) | INTRAVENOUS | 0 refills | Status: DC

## 2022-04-28 MED ORDER — PROCHLORPERAZINE EDISYLATE 10 MG/2ML IJ SOLN: 5.0000 mg | INTRAMUSCULAR | 0 refills | Status: DC | PRN

## 2022-04-28 MED ORDER — HYDRALAZINE HCL 20 MG/ML IJ SOLN: 10.0000 mg | INTRAMUSCULAR | 0 refills | Status: DC | PRN

## 2022-04-28 MED ORDER — FREE WATER: 30.0000 mL | 0 refills | Status: DC

## 2022-04-28 MED ORDER — METOPROLOL TARTRATE 25 MG/10 ML ORAL SUSPENSION: 25.0000 mg | Freq: Two times a day (BID) | ORAL | 0 refills | Status: DC

## 2022-04-28 MED ORDER — SODIUM CHLORIDE 0.9% FLUSH
10.0000 mL | Freq: Two times a day (BID) | INTRAVENOUS | Status: DC
  Administered 2022-04-28: 10 mL

## 2022-04-28 MED ORDER — AMIODARONE HCL IN DEXTROSE 360-4.14 MG/200ML-% IV SOLN: 60.0000 mg/h | INTRAVENOUS | 0 refills | Status: DC

## 2022-04-28 MED ORDER — AMIODARONE HCL IN DEXTROSE 360-4.14 MG/200ML-% IV SOLN
60.0000 mg/h | INTRAVENOUS | Status: AC
  Administered 2022-04-28 (×2): 60 mg/h via INTRAVENOUS
  Filled 2022-04-28 (×2): qty 200

## 2022-04-28 MED ORDER — AMIODARONE LOAD VIA INFUSION
150.0000 mg | Freq: Once | INTRAVENOUS | Status: AC
  Administered 2022-04-28: 150 mg via INTRAVENOUS
  Filled 2022-04-28: qty 83.34

## 2022-04-28 MED ORDER — HEPARIN SODIUM (PORCINE) 5000 UNIT/ML IJ SOLN: 5000.0000 [IU] | Freq: Two times a day (BID) | INTRAMUSCULAR | Status: DC

## 2022-04-28 MED ORDER — BANATROL TF EN LIQD: 60.0000 mL | Freq: Two times a day (BID) | ENTERAL | 1 refills | Status: DC

## 2022-04-28 MED ORDER — ONDANSETRON HCL 4 MG/2ML IJ SOLN: 4.0000 mg | Freq: Four times a day (QID) | INTRAMUSCULAR | 0 refills | Status: DC | PRN

## 2022-04-28 MED ORDER — PANTOPRAZOLE SODIUM 40 MG IV SOLR: 40.0000 mg | Freq: Two times a day (BID) | INTRAVENOUS | 1 refills | Status: DC

## 2022-04-28 MED ORDER — HEPARIN SODIUM (PORCINE) 5000 UNIT/ML IJ SOLN: 5000.0000 [IU] | Freq: Two times a day (BID) | INTRAMUSCULAR | 0 refills | Status: DC

## 2022-04-28 MED ORDER — MENTHOL 3 MG MT LOZG: 1.0000 | LOZENGE | OROMUCOSAL | 12 refills | Status: DC | PRN

## 2022-04-28 MED ORDER — METOPROLOL TARTRATE 5 MG/5ML IV SOLN: 5.0000 mg | Freq: Four times a day (QID) | INTRAVENOUS | 1 refills | Status: DC | PRN

## 2022-04-28 MED ORDER — DIPHENHYDRAMINE HCL 50 MG/ML IJ SOLN: 12.5000 mg | Freq: Four times a day (QID) | INTRAMUSCULAR | 0 refills | Status: DC | PRN

## 2022-04-28 MED ORDER — FLUCONAZOLE IN SODIUM CHLORIDE 400-0.9 MG/200ML-% IV SOLN: 400.0000 mg | INTRAVENOUS | 0 refills | Status: DC

## 2022-04-28 MED ORDER — FUROSEMIDE 10 MG/ML IJ SOLN: 40.0000 mg | Freq: Two times a day (BID) | INTRAMUSCULAR | 0 refills | Status: DC

## 2022-04-28 MED ORDER — CHLORHEXIDINE GLUCONATE CLOTH 2 % EX PADS: 6.0000 | MEDICATED_PAD | Freq: Every day | CUTANEOUS | 0 refills | Status: DC

## 2022-04-28 MED ORDER — SODIUM CHLORIDE 0.9 % IV SOLN: 10.0000 mL | INTRAVENOUS | 0 refills | Status: DC | PRN

## 2022-04-28 MED ORDER — SODIUM CHLORIDE 0.9% FLUSH: 10.0000 mL | INTRAVENOUS | 0 refills | Status: DC | PRN

## 2022-04-28 MED ORDER — PHENOL 1.4 % MT LIQD: 1.0000 | OROMUCOSAL | 0 refills | Status: DC | PRN

## 2022-04-28 MED ORDER — SODIUM CHLORIDE 0.9% FLUSH: 10.0000 mL | INTRAVENOUS | Status: DC | PRN

## 2022-04-28 MED ORDER — PIPERACILLIN-TAZOBACTAM 3.375 G IVPB: 3.3750 g | Freq: Three times a day (TID) | INTRAVENOUS | 0 refills | Status: DC

## 2022-04-28 MED ORDER — KETOROLAC TROMETHAMINE 15 MG/ML IJ SOLN: 15.0000 mg | Freq: Four times a day (QID) | INTRAMUSCULAR | 21 refills | Status: DC | PRN

## 2022-04-28 MED ORDER — AMIODARONE HCL IN DEXTROSE 360-4.14 MG/200ML-% IV SOLN
30.0000 mg/h | INTRAVENOUS | Status: DC
  Administered 2022-04-28: 30 mg/h via INTRAVENOUS
  Filled 2022-04-28 (×2): qty 200

## 2022-04-28 MED ORDER — OSMOLITE 1.2 CAL PO LIQD: 1000.0000 mL | ORAL | 0 refills | Status: DC

## 2022-04-28 MED ORDER — METOPROLOL TARTRATE 5 MG/5ML IV SOLN
5.0000 mg | Freq: Once | INTRAVENOUS | Status: AC
  Administered 2022-04-28: 5 mg via INTRAVENOUS
  Filled 2022-04-28: qty 5

## 2022-04-28 NOTE — Progress Notes (Signed)
PULMONOLOGY         Date: 04/28/2022,   MRN# [id number] Kelly Wood 1940-08-06     AdmissionWeight: 52.6 kg                 CurrentWeight: 60.2 kg  Referring provider: Dr Dahlia Byes   CHIEF COMPLAINT:   Bilateral pleural effusions s/p chest tube insertion    HISTORY OF PRESENT ILLNESS   This is a pleasant 82 year old female with a history of generalized anxiety disorder, anemia, history of COPD and chronic cough with severe GERD and hiatal hernia.  Patient is 12 days postop status post robotic assisted laparoscopic paraesophageal hernia repair complicated by esophageal leak requiring takeback for robotic assisted primary repair of esophageal perforation and placement of a mediastinal drain, feeding jejunostomy placement and right chest tube placement.   She Has Also Been Accepted to [facility] by Thoracic Surgery for Additional Evaluation and Management. She required short Stay in step down unit Now on Medical Floor with Pulmonary Consultation for Findings of Bilateral Pleural Effusion Status Post Chest Tube Placement.  In the Interim Patient Has Been on IV Zosyn and Antifungal Fluconazole.  04/27/22- patient was seen and examined.  Met with husband at bedside.  Reviewed care plan. Repeat CXR with effusion.  Chest tube is poorly conducting.  Diuresis is adequate with >1L net negative overnight.  Clinically improved. ON room air non labred breathing.  Abd mildly distended with J feeds active at 30cc/h.  JP drain with light yellow drainage. WBC count is trending down.  Vanco is dcd due to negative MRSA.  Body fluid recent.   04/28/21- chest tube has been flushed with 30cc saline , poorly conducting despite no resistance with flushing.  Will ask for large bore chest tube placement. She reports clinical improvement and wants to eat PO.  She had 600cc urine output. Reviewed careplan with husband at bedside.  Met with surgery this am to discuss medical plan.    PAST MEDICAL HISTORY   Past  Medical History:  Diagnosis Date   Anemia    Aortic atherosclerosis ([place])    B12 deficiency    Cancer ([place])    basal cell   Cataract cortical, senile    COPD (chronic obstructive pulmonary disease) ([facility])    Dyspnea    Emphysema of lung ([facility])    GERD (gastroesophageal reflux disease)    History of hiatal hernia    Hyperlipidemia    Hypertension    Hypothyroidism    Osteoarthritis of left hip    Osteoporosis    Shingles      SURGICAL HISTORY   Past Surgical History:  Procedure Laterality Date   CATARACT EXTRACTION Right    CHEST TUBE INSERTION Right 04/15/2022   Procedure: CHEST TUBE INSERTION;  Surgeon: Jules Husbands, MD;  Location: [facility];  Service: General;  Laterality: Right;   COLONOSCOPY N/A 06/23/2016   Procedure: COLONOSCOPY;  Surgeon: Manya Silvas, MD;  Location: [place];  Service: Endoscopy;  Laterality: N/A;   COLONOSCOPY  2007   ESOPHAGOGASTRODUODENOSCOPY  03/03/2021   HARDWARE REMOVAL Right 11/06/2020   Procedure: EXCHANGE OF LAG SCREW, RIGHT HIP;  Surgeon: Corky Mull, MD;  Location: [facility];  Service: Orthopedics;  Laterality: Right;   INSERTION OF MESH  04/07/2021   Procedure: INSERTION OF MESH;  Surgeon: Jules Husbands, MD;  Location: [facility];  Service: General;;   INTRAMEDULLARY (IM) NAIL INTERTROCHANTERIC Right 12/26/2018   Procedure: INTRAMEDULLARY (IM) NAIL INTERTROCHANTRIC;  Surgeon: Corky Mull, MD;  Location: [facility];  Service: Orthopedics;  Laterality: Right;   IR PERC PLEURAL DRAIN W/INDWELL CATH W/IMG GUIDE  04/19/2022   LAPAROSCOPIC NISSEN FUNDOPLICATION  82/50/[id number]   Procedure: LAPAROSCOPIC NISSEN FUNDOPLICATION;  Surgeon: Jules Husbands, MD;  Location: [facility];  Service: General;;   TUBAL LIGATION  1970   XI ROBOTIC ASSISTED PARAESOPHAGEAL HERNIA REPAIR N/A 04/07/2021   Procedure: XI ROBOTIC ASSISTED PARAESOPHAGEAL HERNIA REPAIR with RNFA to assist;  Surgeon: Jules Husbands, MD;  Location: [facility];  Service: General;   Laterality: N/A;   XI ROBOTIC ASSISTED PARAESOPHAGEAL HERNIA REPAIR N/A 04/15/2022   Procedure: XI ROBOTIC ASSISTED PARAESOPHAGEAL HERNIA REPAIR, RNFA to assist;  Surgeon: Jules Husbands, MD;  Location: [facility];  Service: General;  Laterality: N/A;   XI ROBOTIC ASSISTED PARAESOPHAGEAL HERNIA REPAIR N/A 04/15/2022   Procedure: XI ROBOTIC ASSISTED PARAESOPHAGEAL HERNIA REPAIR;  Surgeon: Jules Husbands, MD;  Location: [facility];  Service: General;  Laterality: N/A;     FAMILY HISTORY   Family History  Problem Relation Age of Onset   Breast cancer Neg Hx      SOCIAL HISTORY   Social History   Tobacco Use   Smoking status: Former    Types: Cigarettes    Quit date: 10/24/2006    Years since quitting: 15.5   Smokeless tobacco: Never  Vaping Use   Vaping Use: Never used  Substance Use Topics   Alcohol use: Yes    Alcohol/week: 1.0 standard drink of alcohol    Types: 1 Glasses of wine per week    Comment: rarely   Drug use: Never     MEDICATIONS    Home Medication:    Current Medication:  Current Facility-Administered Medications:    0.9 %  sodium chloride infusion, , Intravenous, PRN, Pabon, Diego F, MD   albumin human 25 % solution 12.5 g, 12.5 g, Intravenous, Daily, Ottie Glazier, MD, Last Rate: 60 mL/hr at 04/27/22 0900, 12.5 g at 04/27/22 0900   albuterol (PROVENTIL) (2.5 MG/3ML) 0.083% nebulizer solution 3 mL, 3 mL, Inhalation, Q6H PRN, Pabon, Diego F, MD   [COMPLETED] amiodarone (NEXTERONE) 1.8 mg/mL load via infusion 150 mg, 150 mg, Intravenous, Once, 150 mg at 04/28/22 0602 **FOLLOWED BY** amiodarone (NEXTERONE PREMIX) 360-4.14 MG/200ML-% (1.8 mg/mL) IV infusion, 60 mg/hr, Intravenous, Continuous, Last Rate: 33.3 mL/hr at 04/28/22 0600, 60 mg/hr at 04/28/22 0600 **FOLLOWED BY** amiodarone (NEXTERONE PREMIX) 360-4.14 MG/200ML-% (1.8 mg/mL) IV infusion, 30 mg/hr, Intravenous, Continuous, Foust, Katy L, NP   Chlorhexidine Gluconate Cloth 2 % PADS 6 each, 6 each, Topical,  Daily, Pabon, Diego F, MD, 6 each at 04/27/22 0857   Chlorhexidine Gluconate Cloth 2 % PADS 6 each, 6 each, Topical, Q0600, Pabon, Diego F, MD, 6 each at 04/28/22 0546   [DISCONTINUED] diphenhydrAMINE (BENADRYL) 12.5 MG/5ML elixir 12.5 mg, 12.5 mg, Oral, Q6H PRN **OR** diphenhydrAMINE (BENADRYL) injection 12.5 mg, 12.5 mg, Intravenous, Q6H PRN, Pabon, Diego F, MD   enoxaparin (LOVENOX) injection 40 mg, 40 mg, Subcutaneous, Q24H, Pabon, Diego F, MD, 40 mg at 04/27/22 0847   feeding supplement (OSMOLITE 1.2 CAL) liquid 1,000 mL, 1,000 mL, Per Tube, Continuous, Tylene Fantasia, PA-C, Last Rate: 30 mL/hr at 04/27/22 1027, 1,000 mL at 04/27/22 1027   fiber supplement (BANATROL TF) liquid 60 mL, 60 mL, Per Tube, BID, Tylene Fantasia, PA-C, 60 mL at 04/27/22 2144   fluconazole (DIFLUCAN) IVPB 400 mg, 400 mg, Intravenous, Q24H, Lorin Picket, RPH,  Last Rate: 100 mL/hr at 04/27/22 1651, 400 mg at 04/27/22 1651   free water 30 mL, 30 mL, Per Tube, Q4H, Pabon, Diego F, MD, 30 mL at 04/28/22 0409   furosemide (LASIX) injection 40 mg, 40 mg, Intravenous, BID, Ottie Glazier, MD, 40 mg at 04/27/22 1652   hydrALAZINE (APRESOLINE) injection 10 mg, 10 mg, Intravenous, Q2H PRN, Pabon, Diego F, MD, 10 mg at 04/18/22 1824   iohexol (OMNIPAQUE) 350 MG/ML injection 75 mL, 75 mL, Intravenous, Once PRN, Pabon, Diego F, MD   ketorolac (TORADOL) 15 MG/ML injection 15 mg, 15 mg, Intravenous, Q6H PRN, Pabon, Diego F, MD, 15 mg at 04/27/22 0103   levothyroxine (SYNTHROID) tablet 50 mcg, 50 mcg, Per Tube, Q0600, Dorothe Pea, RPH, 50 mcg at 04/28/22 0544   menthol-cetylpyridinium (CEPACOL) lozenge 3 mg, 1 lozenge, Oral, PRN, Tylene Fantasia, PA-C, 3 mg at 04/18/22 0519   metoprolol tartrate (LOPRESSOR) 25 mg/10 mL oral suspension 25 mg, 25 mg, Per Tube, BID, Ottie Glazier, MD, 25 mg at 04/27/22 2146   metoprolol tartrate (LOPRESSOR) injection 5 mg, 5 mg, Intravenous, Q6H PRN, Minna Merritts, MD, 5 mg at  04/28/22 0039   morphine (PF) 4 MG/ML injection 2 mg, 2 mg, Intravenous, Q4H PRN, Pabon, Diego F, MD, 2 mg at 04/28/22 0235   naphazoline-glycerin (CLEAR EYES REDNESS) ophth solution 1-2 drop, 1-2 drop, Both Eyes, QID PRN, Pabon, Diego F, MD   ondansetron (ZOFRAN) injection 4 mg, 4 mg, Intravenous, Q6H PRN, Pabon, Diego F, MD, 4 mg at 04/26/22 0600   Oral care mouth rinse, 15 mL, Mouth Rinse, PRN, Pabon, Diego F, MD   oxyCODONE (ROXICODONE) 5 MG/5ML solution 5 mg, 5 mg, Per Tube, Q4H PRN, Pabon, Diego F, MD   pantoprazole (PROTONIX) injection 40 mg, 40 mg, Intravenous, Q12H, Pabon, Diego F, MD, 40 mg at 04/27/22 2146   phenol (CHLORASEPTIC) mouth spray 1 spray, 1 spray, Mouth/Throat, PRN, Tylene Fantasia, PA-C, 1 spray at 04/28/22 0345   piperacillin-tazobactam (ZOSYN) IVPB 3.375 g, 3.375 g, Intravenous, Q8H, Pabon, Diego F, MD, Last Rate: 12.5 mL/hr at 04/28/22 0544, 3.375 g at 04/28/22 0544   prochlorperazine (COMPAZINE) injection 5 mg, 5 mg, Intravenous, Q4H PRN, Pabon, Diego F, MD, 5 mg at 04/26/22 0414    ALLERGIES   Ativan [lorazepam], Azithromycin, Levofloxacin, and Sulfa antibiotics     REVIEW OF SYSTEMS    Review of Systems:  Gen:  Denies  fever, sweats, chills weigh loss  HEENT: Denies blurred vision, double vision, ear pain, eye pain, hearing loss, nose bleeds, sore throat Cardiac:  No dizziness, chest pain or heaviness, chest tightness,edema Resp:   reports dyspnea chronically  Gi: Denies swallowing difficulty, stomach pain, nausea or vomiting, diarrhea, constipation, bowel incontinence Gu:  Denies bladder incontinence, burning urine Ext:   Denies Joint pain, stiffness or swelling Skin: Denies  skin rash, easy bruising or bleeding or hives Endoc:  Denies polyuria, polydipsia , polyphagia or weight change Psych:   Denies depression, insomnia or hallucinations   Other:  All other systems negative   VS: BP 114/75 (BP Location: Left Arm)   Pulse 76   Temp 98.3 F  (36.8 C) (Oral)   Resp 20   Ht 5' (1.524 m)   Wt 60.2 kg   SpO2 95%   BMI 25.92 kg/m      PHYSICAL EXAM    GENERAL:NAD, no fevers, chills, no weakness no fatigue HEAD: Normocephalic, atraumatic.  EYES: Pupils equal, round, reactive to light.  Extraocular muscles intact. No scleral icterus.  MOUTH: Moist mucosal membrane. Dentition intact. No abscess noted.  EAR, NOSE, THROAT: Clear without exudates. No external lesions.  NECK: Supple. No thyromegaly. No nodules. No JVD.  PULMONARY: decreased breath sounds with mild rhonchi worse at bases bilaterally. + r chest tube CARDIOVASCULAR: S1 and S2. Regular rate and rhythm. No murmurs, rubs, or gallops. No edema. Pedal pulses 2+ bilaterally.  GASTROINTESTINAL: Soft, nontender, nondistended. No masses. Positive bowel sounds. No hepatosplenomegaly. +JP drain left abd. MUSCULOSKELETAL: No swelling, clubbing, or edema. Range of motion full in all extremities.  NEUROLOGIC: Cranial nerves II through XII are intact. No gross focal neurological deficits. Sensation intact. Reflexes intact.  SKIN: No ulceration, lesions, rashes, or cyanosis. Skin warm and dry. Turgor intact.  PSYCHIATRIC: Mood, affect within normal limits. The patient is awake, alert and oriented x 3. Insight, judgment intact.       IMAGING   '@IMAGES'$ @   ASSESSMENT/PLAN   Bilateral pleural effusions s/p paraesophageal hernia repair with esophageal leak.      Reviewed CT chest with bilateral pleural effusions.  Patient has adequate renal function and we can attempt to diurese more.  I have added lasix 40 IV BID.      - will culture pleural effusion with gram stain and perform additional testing with cel count and diff, pH , microbiology.   - There is hypoalbuminemia which may be contributing to this and is likely due to poor nourishment over past few weeks.     -MRSA pcr- if positive need to add vanco until micro returns   -Procalcitonin trend   - Zosyn and fluconazole to be  continued    - WBC count trending down and renal function is normal    Chest tube management   - poorly conducting pleural drain with no output overnight despite CXR with moderate effusion.  Flushed with 30cc saline no resitance but not aspirating well on -20 suction.  There is no air leak with forced expiratory maneuver.  There is no pain around chest tube insertion site.    Chronic centirlobular emphysema with COPD   No signs of acute exacerbation    Hypoalbuminemia      - RD nutrition consult        -contributing to pleural effusion         Thank you for allowing me to participate in the care of this patient.   Patient/Family are satisfied with care plan and all questions have been answered.    Provider disclosure: Patient with at least one acute or chronic illness or injury that poses a threat to life or bodily function and is being managed actively during this encounter.  All of the below services have been performed independently by signing provider:  review of prior documentation from internal and or external health records.  Review of previous and current lab results.  Interview and comprehensive assessment during patient visit today. Review of current and previous chest radiographs/CT scans. Discussion of management and test interpretation with health care team and patient/family.   This document was prepared using Dragon voice recognition software and may include unintentional dictation errors.     Ottie Glazier, M.D.  Division of Pulmonary & Critical Care Medicine

## 2022-04-28 NOTE — Progress Notes (Addendum)
Physical Therapy Treatment Patient Details Name: Kelly Wood MRN: [id number] DOB: [birth year] Today's Date: 04/28/2022   History of Present Illness Pt is a 82 year old female  21 Days Post-Op s/p robotic assisted laparoscopic paraesophageal hernia repair complicated by esophageal leak requiring immediate take back for robotic assisted primary repair of esophageal perforation, placement of mediastinal drain, feeding jejunostomy placement, and right chest tube placements; On 1/14 1/14: New onset narrow complex tachycardia, SVT, transferred to stepdown.  Cardiology consulted.  Started on IV amiodarone; PMH significant for  HTN, HLD, hypothyroid, COPD, anemia, GERD, s/p repair of paraesophageal hernia robotically approximately 11 months ago    PT Comments    Pt is making gradual progress towards goals, however continues to be limited by medical management. Pt with multiple lines/leads, discussed via secure chat to place to waterseal for mobilization. Attempted weaning of O2, however O2 sats quickly decrease to 80% on RA, therefore all mobility performed on 2L of O2 with sats WNL and HR better controlled. Will continue to progress.   Recommendations for follow up therapy are one component of a multi-disciplinary discharge planning process, led by the attending physician.  Recommendations may be updated based on patient status, additional functional criteria and insurance authorization.  Follow Up Recommendations  Skilled nursing-short term rehab (<3 hours/day) Can patient physically be transported by private vehicle: Yes   Assistance Recommended at Discharge Intermittent Supervision/Assistance  Patient can return home with the following A little help with walking and/or transfers;A little help with bathing/dressing/bathroom;Assist for transportation;Help with stairs or ramp for entrance;Assistance with cooking/housework   Equipment Recommendations  None recommended by PT    Recommendations for  Other Services       Precautions / Restrictions Precautions Precautions: Fall Precaution Comments: R chest tube, JP drain, G-tube Restrictions Weight Bearing Restrictions: No     Mobility  Bed Mobility Overal bed mobility: Needs Assistance Bed Mobility: Supine to Sit     Supine to sit: Min assist Sit to supine: Min assist   General bed mobility comments: needs assist for trunkal support. Follows commands well    Transfers Overall transfer level: Needs assistance Equipment used: Rolling walker (2 wheels) Transfers: Sit to/from Stand, Bed to chair/wheelchair/BSC             General transfer comment: improved technique using RW, ONly required cga    Ambulation/Gait Ambulation/Gait assistance: Min guard Gait Distance (Feet): 30 Feet Assistive device: Rolling walker (2 wheels) Gait Pattern/deviations: Step-through pattern       General Gait Details: limited secondary to wall suction via chest tube. All mobility performed with improved balance/endurance. Forward/retro ambulation performed. All mobiity performed on 2L with sats at 97%. HR at 74bpm   Stairs             Wheelchair Mobility    Modified Rankin (Stroke Patients Only)       Balance Overall balance assessment: Needs assistance Sitting-balance support: Feet supported Sitting balance-Leahy Scale: Good     Standing balance support: Bilateral upper extremity supported, During functional activity Standing balance-Leahy Scale: Fair                              Cognition Arousal/Alertness: Awake/alert Behavior During Therapy: WFL for tasks assessed/performed Overall Cognitive Status: Within Functional Limits for tasks assessed  General Comments: cooperative and agreeable to session        Exercises Other Exercises Other Exercises: pt with incontience with mobility during standing attempt and voids in the floor. Able to don mesh  underwear and pad for further ambulation.    General Comments        Pertinent Vitals/Pain Pain Assessment Pain Assessment: No/denies pain    Home Living                          Prior Function            PT Goals (current goals can now be found in the care plan section) Acute Rehab PT Goals Patient Stated Goal: to go home PT Goal Formulation: With patient/family Time For Goal Achievement: 05/04/22 Potential to Achieve Goals: Good Progress towards PT goals: Progressing toward goals    Frequency    Min 2X/week      PT Plan Discharge plan needs to be updated    Co-evaluation              AM-PAC PT "6 Clicks" Mobility   Outcome Measure  Help needed turning from your back to your side while in a flat bed without using bedrails?: A Little Help needed moving from lying on your back to sitting on the side of a flat bed without using bedrails?: A Little Help needed moving to and from a bed to a chair (including a wheelchair)?: A Little Help needed standing up from a chair using your arms (e.g., wheelchair or bedside chair)?: A Little Help needed to walk in hospital room?: A Little Help needed climbing 3-5 steps with a railing? : A Lot 6 Click Score: 17    End of Session Equipment Utilized During Treatment: Oxygen Activity Tolerance: Patient tolerated treatment well Patient left: in bed;with bed alarm set;with call bell/phone within reach;with family/visitor present Nurse Communication: Mobility status PT Visit Diagnosis: Unsteadiness on feet (R26.81);Muscle weakness (generalized) (M62.81);Pain Pain - Right/Left:  (abdominal) Pain - part of body:  (abdominal)     Time: 9741-6384 PT Time Calculation (min) (ACUTE ONLY): 43 min  Charges:  $Gait Training: 23-37 mins $Therapeutic Activity: 8-22 mins                     Greggory Stallion, PT, DPT, GCS [phone]    Kelly Wood 04/28/2022, 3:13 PM

## 2022-04-28 NOTE — Discharge Summary (Signed)
Patient ID: Deslyn Cavenaugh MRN: [id number] DOB/AGE: [birth year] 80 y.o.  Admit date: 04/15/2022 Discharge date: 04/28/2022   Discharge Diagnoses:  Principal Problem:   S/P repair of paraesophageal hernia Active Problems:   Esophageal perforation   SVT (supraventricular tachycardia)   Atrial tachycardia   Hiatal hernia   Procedures: 1.Robotic redo paraesophageal hernia repair w BioA mesh 04/15/22 2.Takeback for robotic repair of esophageal perforation, Removal of Bio-A, placement of right chest tube and feeding jejunostomy, mediastinal drain placement 04/15/22  Hospital Course:  Mrs. 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  She had a recurrence that was symptomatic.  She was scheduled for a redo paraesophageal hernia repair which went uneventful on April 15, 2022.  Given that she has significant Dez adhesions within the mediastinum we performed a swallow of the esophagus showing evidence of a leak.  This was confirmed by CT esophagogram.  Discussed with her in detail I immediately decided to take her back.  Other options included potential stent and transferring to a different facility but I considered that this will further delay her care. I consulted witth Dr. Roxan Hockey from thoracic surgery at [facility], Felt that operative intervention was the best choice. I contemplating esophageal stents but they were not readily available at [facility] and this may have had a delayed in her care. I took her back for a robotic repair of the esophagus with right chest tube placement and feeding jejunostomy , placement of mediastinal drain.She did well postoperatively and will start feeding her the next day.  We left an NG tube in place and Her on Zosyn.  She did develop a supraventricular tachycardia and cardiology was involved initially amiodarone was chosen  and then switched to Lopressor.  She did okay.  On postoperative day #6 she did have a barium swallow showing no evidence of a leak.  The NG tube was removed and her diet was advanced.  She tolerated initially the liquid diet well.  For the next couple of days the patient seem to not had significant improvement and repeat imaging failed to demonstrate evidence of a leak.  I did decided to exchange the right chest tube and ask radiology place a posterior 1.  Her white count continued to climb up and repeat imaging of the chest show more loculated right pleural effusion.  Extensive discussion with interventional radiology was held and they felt that they could not reposition the chest tube and felt that thrombolysis was potentially require.  I was very hesitant on using thrombolysis on a fresh postoperative patient.  I did reach out to Dr. Roxan Hockey with [facility] and he also advised me to potentially transfer to a different tertiary care that may have the capacity to place esophageal stent.  I did reach out to Dr. Laverta Baltimore at [facility] after clinical deterioration of the patient.  He did examined the imaging studies and agree with me that that loculated pleural effusion probably needed to be drained.  We also added Diflucan as an antifungal.  We decreased her tube feeds because she seemed to have a difficult time with Osmolite at full rate.  We also made her n.p.o. again and transitional her medicine via the feeding jejunostomy.  That seemed to have a significant improvement and for the next 48 hours the patient continue to improve.  She again  went back into SVT and required restarting the amiodarone drip.  On 24 January I got a call from [facility] saying that there was already a bed available for her.  Please note that we waited approximately 48 hours for a bed at [facility].  Patient also get a PICC line on 24 January because she had some issues before with infiltration of her peripheral IVs secondary to Amio drip. At  the time of discharge her vital signs were stable she was on normal sinus rhythm.  Right chest had some decreased breath sounds.  The incisions on the abdominal wall were healing well.  There was evidence of a mediastinal drain that had migrated and was putting out serous fluid.  She had a jejunostomy tube in place without evidence of infection and fully patent.  All the other incisions were healing well. Condition of the patient at time of transfer was stable   Consults: pulmonary medicine, cardiology  Disposition: Discharge disposition: [place] Not Defined        Allergies as of 04/28/2022       Reactions   Ativan [lorazepam] Anxiety   Paranoia    Azithromycin Diarrhea   Levofloxacin Diarrhea   Pt prefers not to take because gi side effects   Sulfa Antibiotics Rash        Medication List     STOP taking these medications    alendronate 70 MG tablet Commonly known as: FOSAMAX   aspirin EC 81 MG tablet   bisoprolol-hydrochlorothiazide 5-6.25 MG tablet Commonly known as: ZIAC   diltiazem 240 MG 24 hr capsule Commonly known as: CARDIZEM CD   fluocinonide gel 0.05 % Commonly known as: LIDEX   furosemide 40 MG tablet Commonly known as: LASIX   ibuprofen 200 MG tablet Commonly known as: ADVIL   levothyroxine 75 MCG tablet Commonly known as: SYNTHROID   Melatonin 5 MG Caps   metoCLOPramide 5 MG tablet Commonly known as: Reglan   multivitamin with minerals Tabs tablet   ondansetron 4 MG tablet Commonly known as: Zofran   pantoprazole 40 MG tablet Commonly known as: PROTONIX   pravastatin 80 MG tablet Commonly known as: PRAVACHOL   PROBIOTIC DAILY PO   tetrahydrozoline 0.05 % ophthalmic solution   Turmeric 500 MG Tabs   Ventolin HFA 108 (90 Base) MCG/ACT inhaler Generic drug: albuterol          [name], MD FACS

## 2022-04-28 NOTE — Progress Notes (Signed)
PROGRESS NOTE    Kelly Wood  YQI:[id number] DOB: [birth year] DOA: 04/15/2022 PCP: Idelle Crouch, MD    Brief Narrative:  Kelly Wood is a 82 y.o. female with PMH of HTN, HLD, hypothyroid, COPD, anemia, GERD, s/p repair of paraesophageal hernia robotically approximately 11 months ago, presented to ED on 04/15/2022 with persistent bloating, eructation and chest discomfort.  She was diagnosed with symptomatic recurrent paraesophageal hernia.  Admitted by the surgical team.  She underwent robotic assisted laparoscopic repair of paraesophageal hernia with bio mesh and Nissen fundoplication, redo on 3/87 which was complicated by esophageal perforation, right pneumothorax requiring right chest tube placement, robotic assisted repair of esophageal perforation, jejunostomy tube, mediastinal Blake drain placement.  TRH consulted 1/12 for management of medical issues.  1/14: New onset narrow complex tachycardia, SVT, transferred to stepdown.  Cardiology consulted.  Started on IV amiodarone and maintaining SR.   I am consulted for medical management.  On 1/20 4 AM patient went back into tachyarrhythmia.  Given IV metoprolol without result.  Restarted on amiodarone gtt.  Remains on amiodarone drip at this time.   Assessment & Plan:   Principal Problem:   S/P repair of paraesophageal hernia Active Problems:   Esophageal perforation   SVT (supraventricular tachycardia)   Atrial tachycardia   Hiatal hernia SVT/paroxysmal atrial tachycardia: Issues with heart rate control.  Heart rate back up into 150s on 1/24.  Restarted on amiodarone gtt.  Will continue amiodarone drip at this time.  Oral metoprolol when able to take p.o.  Cardiology following.    Delirium, intermittent confusion: Improved.  Avoid benzodiazepines because patient reportedly had a paradoxical response to IV Ativan.  Minimize opioids and sedating medications.  Patient at baseline level of mentation     S/p robotic assisted  laparoscopic paraesophageal hernia repair 5/64, complicated by esophageal leak requiring immediate takeback for robotic assisted primary repair of esophageal perforation, placement of mediastinal drain/right chest tube/feeding jejunostomy tube, leukocytosis, concern for infected right pleural fluid: WBC is trending down.  He is on IV Zosyn, IV fluconazole.  Follow-up with general surgeon and pulmonologist.  Patient is on transfer list for [facility].  Transfer pending bed availability.  Hypokalemia: Improved   Hyponatremia: Sodium level is stable.  Continue to monitor.   Other comorbidities include hyperlipidemia, hypothyroidism, hypertension, chronic anemia   DVT prophylaxis: Per general surgery Code Status: Full Family Communication: Husband at bedside Disposition Plan: Plan to transfer to [facility] when bed available Level of care: Progressive  Consultants:  Hospitalist Cardiology Pulmonology  Procedures:  Right chest tube placement Robotic assisted primary repair of esophageal perforation Robotic assisted jejunostomy tube Robotic mediastinal blake drain placement Removal of BioA mesh Omental flap as tissue buttressing  Antimicrobials: None   Subjective: Seen and examined.  Resting in bed.  Endorses gas pain and frequent loose bowel movements.  Objective: Vitals:   04/28/22 0642 04/28/22 0657 04/28/22 0809 04/28/22 1205  BP: 108/73 114/75 111/74 (!) 142/78  Pulse: 75 76 74 64  Resp:   16 16  Temp:   98.9 F (37.2 C) 99.2 F (37.3 C)  TempSrc:    Oral  SpO2:   95% 100%  Weight:      Height:        Intake/Output Summary (Last 24 hours) at 04/28/2022 1413 Last data filed at 04/28/2022 0938 Gross per 24 hour  Intake 1179.08 ml  Output 1065 ml  Net 114.08 ml   Filed Weights   04/18/22 0500 04/20/22 0334 04/24/22  0345  Weight: 62.5 kg 62.5 kg 60.2 kg    Examination:  General exam: NAD.  Appears fatigued Respiratory system: Clear to auscultation. Respiratory effort  normal. Cardiovascular system: S1-S2, RRR, no murmurs, no pedal edema Gastrointestinal system: Soft, nondistended, hyperactive bowel sounds, midline abdominal wound dressing CDI, jejunostomy tube in place Central nervous system: Alert and oriented. No focal neurological deficits. Extremities: Symmetric 5 x 5 power. Skin: No rashes, lesions or ulcers Psychiatry: Judgement and insight appear normal. Mood & affect appropriate.     Data Reviewed: I have personally reviewed following labs and imaging studies  CBC: Recent Labs  Lab 04/23/22 0526 04/25/22 0854 04/26/22 0300 04/27/22 0345 04/28/22 0604  WBC 18.5* 25.5* 28.4* 24.3* 22.5*  NEUTROABS  --   --   --   --  17.2*  HGB 10.3* 9.6* 9.8* 9.4* 9.6*  HCT 31.6* 30.3* 30.9* 29.5* 29.5*  MCV 82.9 84.9 84.2 84.5 82.9  PLT 416* 515* 640* 644* 341*   Basic Metabolic Panel: Recent Labs  Lab 04/22/22 0558 04/23/22 0526 04/25/22 0548 04/26/22 0300 04/27/22 0345 04/28/22 0600 04/28/22 0604  NA 128* 130* 131* 129* 130*  --  132*  K 3.5 3.6 4.7 4.7 3.9  --  4.1  CL 94* 95* 97* 95* 93*  --  95*  CO2 '26 26 25 26 27  '$ --  25  GLUCOSE 165* 150* 113* 132* 100*  --  122*  BUN '20 19 19 19 19  '$ --  23  CREATININE 0.42* 0.58 0.49 0.50 0.56  --  0.77  CALCIUM 7.4* 7.5* 7.5* 7.7* 7.8*  --  7.9*  MG 2.0  --  2.2  --   --  2.2  --   PHOS 3.7  --   --   --   --  5.8*  --    GFR: Estimated Creatinine Clearance: 46.3 mL/min (by C-G formula based on SCr of 0.77 mg/dL). Liver Function Tests: Recent Labs  Lab 04/25/22 0548 04/26/22 0300 04/27/22 0345 04/28/22 0604  AST 43* 38 32 25  ALT 41 44 36 28  ALKPHOS 199* 184* 171* 182*  BILITOT <0.1* 0.4 0.5 0.6  PROT 5.8* 6.0* 6.1* 6.5  ALBUMIN 1.8* 1.8* 2.1* 2.2*   No results for input(s): "LIPASE", "AMYLASE" in the last 168 hours. No results for input(s): "AMMONIA" in the last 168 hours. Coagulation Profile: No results for input(s): "INR", "PROTIME" in the last 168 hours. Cardiac  Enzymes: No results for input(s): "CKTOTAL", "CKMB", "CKMBINDEX", "TROPONINI" in the last 168 hours. BNP (last 3 results) No results for input(s): "PROBNP" in the last 8760 hours. HbA1C: No results for input(s): "HGBA1C" in the last 72 hours. CBG: Recent Labs  Lab 04/27/22 2037 04/28/22 0029 04/28/22 0355 04/28/22 0912 04/28/22 1330  GLUCAP 112* 117* 128* 117* 115*   Lipid Profile: No results for input(s): "CHOL", "HDL", "LDLCALC", "TRIG", "CHOLHDL", "LDLDIRECT" in the last 72 hours. Thyroid Function Tests: No results for input(s): "TSH", "T4TOTAL", "FREET4", "T3FREE", "THYROIDAB" in the last 72 hours. Anemia Panel: No results for input(s): "VITAMINB12", "FOLATE", "FERRITIN", "TIBC", "IRON", "RETICCTPCT" in the last 72 hours. Sepsis Labs: No results for input(s): "PROCALCITON", "LATICACIDVEN" in the last 168 hours.  Recent Results (from the past 240 hour(s))  MRSA Next Gen by PCR, Nasal     Status: None   Collection Time: 04/26/22  9:00 AM   Specimen: Nasal Mucosa; Nasal Swab  Result Value Ref Range Status   MRSA by PCR Next Gen NOT DETECTED NOT DETECTED  Final    Comment: (NOTE) The GeneXpert MRSA Assay (FDA approved for NASAL specimens only), is one component of a comprehensive MRSA colonization surveillance program. It is not intended to diagnose MRSA infection nor to guide or monitor treatment for MRSA infections. Test performance is not FDA approved in patients less than 56 years old. Performed at [facility], [address] [place], [place] [address]   Body fluid culture w Gram Stain     Status: None (Preliminary result)   Collection Time: 04/27/22 11:30 AM   Specimen: Pleura; Body Fluid  Result Value Ref Range Status   Specimen Description PLEURAL FLUID  Final   Special Requests CHEST  Final   Gram Stain   Final    NO WBC SEEN NO ORGANISMS SEEN Performed at [facility], [address] [place], [place] [address]    Culture PENDING   Incomplete   Report Status PENDING  Incomplete         Radiology Studies: DG Chest Port 1 View  Result Date: 04/28/2022 CLINICAL DATA:  Follow-up pleural effusion and chest tube EXAM: PORTABLE CHEST 1 VIEW COMPARISON:  04/27/2022 FINDINGS: Right chest tube remains in place. Cardiomegaly as seen yesterday. Aortic atherosclerotic calcification is present. No pneumothorax on the right. Mild atelectasis persists in the right lower lung. Moderate atelectasis persists in the left lower lobe. IMPRESSION: Right chest tube remains in place. No pneumothorax on the right. Persistent atelectasis in the lower lungs, left more than right. Electronically Signed   By: Nelson Chimes M.D.   On: 04/28/2022 09:22   DG Chest Port 1 View  Result Date: 04/27/2022 CLINICAL DATA:  Pleural effusion EXAM: PORTABLE CHEST 1 VIEW COMPARISON:  04/23/2022, CT 04/25/2022, chest x-ray 04/19/2022 FINDINGS: Right-sided chest tube remains in place. Residual bilateral pleural effusions and consolidation at the left lung base. Hazy edema or infiltrates in the right thorax. No visible pneumothorax. Cardiomegaly with aortic atherosclerosis and central congestion. Hiatal hernia. IMPRESSION: 1. Right-sided chest tube in place with residual bilateral pleural effusions and consolidation at the left lung base. Hazy edema or infiltrates in the right thorax. Findings do not appear significantly changed as compared with radiograph from 04/23/2022. 2. Cardiomegaly with central congestion. Electronically Signed   By: Donavan Foil M.D.   On: 04/27/2022 15:34   [place] EKG SITE RITE  Result Date: 04/26/2022 If Site Rite image not attached, placement could not be confirmed due to current cardiac rhythm.       Scheduled Meds:  Chlorhexidine Gluconate Cloth  6 each Topical Daily   Chlorhexidine Gluconate Cloth  6 each Topical Q0600   fiber supplement (BANATROL TF)  60 mL Per Tube BID   free water  30 mL Per Tube Q4H   furosemide  40 mg  Intravenous BID   [START ON 04/29/2022] heparin injection (subcutaneous)  5,000 Units Subcutaneous Q12H   levothyroxine  50 mcg Per Tube Q0600   metoprolol tartrate  25 mg Per Tube BID   pantoprazole (PROTONIX) IV  40 mg Intravenous Q12H   Continuous Infusions:  sodium chloride     albumin human 12.5 g (04/28/22 0938)   amiodarone 30 mg/hr (04/28/22 1206)   feeding supplement (OSMOLITE 1.2 CAL) 1,000 mL (04/27/22 1027)   fluconazole (DIFLUCAN) IV 400 mg (04/27/22 1651)   piperacillin-tazobactam (ZOSYN)  IV 3.375 g (04/28/22 1333)     LOS: 12 days    Sidney Ace, MD [facility]   If 7PM-7AM, please contact night-coverage  04/28/2022, 2:13 PM

## 2022-04-28 NOTE — Consult Note (Addendum)
PULMONOLOGY         Date: 04/28/2022,   MRN# [id number] Kelly Wood 07-20-1940     AdmissionWeight: 52.6 kg                 CurrentWeight: 60.2 kg  Referring provider: Dr Dahlia Byes   CHIEF COMPLAINT:   Bilateral pleural effusions s/p chest tube insertion    HISTORY OF PRESENT ILLNESS   This is a pleasant 82 year old female with a history of generalized anxiety disorder, anemia, history of COPD and chronic cough with severe GERD and hiatal hernia.  Patient is 12 days postop status post robotic assisted laparoscopic paraesophageal hernia repair complicated by esophageal leak requiring takeback for robotic assisted primary repair of esophageal perforation and placement of a mediastinal drain, feeding jejunostomy placement and right chest tube placement.   She Has Also Been Accepted to [facility] by Thoracic Surgery for Additional Evaluation and Management. She required short Stay in step down unit Now on Medical Floor with Pulmonary Consultation for Findings of Bilateral Pleural Effusion Status Post Chest Tube Placement.  In the Interim Patient Has Been on IV Zosyn and Antifungal Fluconazole.  04/27/22- patient was seen and examined.  Met with husband at bedside.  Reviewed care plan. Repeat CXR with effusion.  Chest tube is poorly conducting.  Diuresis is adequate with >1L net negative overnight.  Clinically improved. ON room air non labred breathing.  Abd mildly distended with J feeds active at 30cc/h.  JP drain with light yellow drainage. WBC count is trending down.  Vanco is dcd due to negative MRSA.  Body fluid recent.      PAST MEDICAL HISTORY   Past Medical History:  Diagnosis Date   Anemia    Aortic atherosclerosis ([place])    B12 deficiency    Cancer ([place])    basal cell   Cataract cortical, senile    COPD (chronic obstructive pulmonary disease) (HCC)    Dyspnea    Emphysema of lung (HCC)    GERD (gastroesophageal reflux disease)    History of hiatal hernia     Hyperlipidemia    Hypertension    Hypothyroidism    Osteoarthritis of left hip    Osteoporosis    Shingles      SURGICAL HISTORY   Past Surgical History:  Procedure Laterality Date   CATARACT EXTRACTION Right    CHEST TUBE INSERTION Right 04/15/2022   Procedure: CHEST TUBE INSERTION;  Surgeon: Jules Husbands, MD;  Location: [facility];  Service: General;  Laterality: Right;   COLONOSCOPY N/A 06/23/2016   Procedure: COLONOSCOPY;  Surgeon: Manya Silvas, MD;  Location: [place];  Service: Endoscopy;  Laterality: N/A;   COLONOSCOPY  2007   ESOPHAGOGASTRODUODENOSCOPY  03/03/2021   HARDWARE REMOVAL Right 11/06/2020   Procedure: EXCHANGE OF LAG SCREW, RIGHT HIP;  Surgeon: Corky Mull, MD;  Location: [facility];  Service: Orthopedics;  Laterality: Right;   INSERTION OF MESH  04/07/2021   Procedure: INSERTION OF MESH;  Surgeon: Jules Husbands, MD;  Location: [facility];  Service: General;;   INTRAMEDULLARY (IM) NAIL INTERTROCHANTERIC Right 12/26/2018   Procedure: INTRAMEDULLARY (IM) NAIL INTERTROCHANTRIC;  Surgeon: Corky Mull, MD;  Location: [facility];  Service: Orthopedics;  Laterality: Right;   IR PERC PLEURAL DRAIN W/INDWELL CATH W/IMG GUIDE  04/19/2022   LAPAROSCOPIC NISSEN FUNDOPLICATION  75/01/2584   Procedure: LAPAROSCOPIC NISSEN FUNDOPLICATION;  Surgeon: Jules Husbands, MD;  Location: [facility];  Service: General;;   TUBAL  LIGATION  1970   XI ROBOTIC ASSISTED PARAESOPHAGEAL HERNIA REPAIR N/A 04/07/2021   Procedure: XI ROBOTIC ASSISTED PARAESOPHAGEAL HERNIA REPAIR with RNFA to assist;  Surgeon: Jules Husbands, MD;  Location: [facility];  Service: General;  Laterality: N/A;   XI ROBOTIC ASSISTED PARAESOPHAGEAL HERNIA REPAIR N/A 04/15/2022   Procedure: XI ROBOTIC ASSISTED PARAESOPHAGEAL HERNIA REPAIR, RNFA to assist;  Surgeon: Jules Husbands, MD;  Location: [facility];  Service: General;  Laterality: N/A;   XI ROBOTIC ASSISTED PARAESOPHAGEAL HERNIA REPAIR N/A 04/15/2022   Procedure: XI  ROBOTIC ASSISTED PARAESOPHAGEAL HERNIA REPAIR;  Surgeon: Jules Husbands, MD;  Location: [facility];  Service: General;  Laterality: N/A;     FAMILY HISTORY   Family History  Problem Relation Age of Onset   Breast cancer Neg Hx      SOCIAL HISTORY   Social History   Tobacco Use   Smoking status: Former    Types: Cigarettes    Quit date: 10/24/2006    Years since quitting: 15.5   Smokeless tobacco: Never  Vaping Use   Vaping Use: Never used  Substance Use Topics   Alcohol use: Yes    Alcohol/week: 1.0 standard drink of alcohol    Types: 1 Glasses of wine per week    Comment: rarely   Drug use: Never     MEDICATIONS    Home Medication:    Current Medication:  Current Facility-Administered Medications:    0.9 %  sodium chloride infusion, , Intravenous, PRN, Pabon, Diego F, MD   albumin human 25 % solution 12.5 g, 12.5 g, Intravenous, Daily, Ottie Glazier, MD, Last Rate: 60 mL/hr at 04/27/22 0900, 12.5 g at 04/27/22 0900   albuterol (PROVENTIL) (2.5 MG/3ML) 0.083% nebulizer solution 3 mL, 3 mL, Inhalation, Q6H PRN, Pabon, Diego F, MD   [COMPLETED] amiodarone (NEXTERONE) 1.8 mg/mL load via infusion 150 mg, 150 mg, Intravenous, Once, 150 mg at 04/28/22 0602 **FOLLOWED BY** amiodarone (NEXTERONE PREMIX) 360-4.14 MG/200ML-% (1.8 mg/mL) IV infusion, 60 mg/hr, Intravenous, Continuous, Last Rate: 33.3 mL/hr at 04/28/22 0600, 60 mg/hr at 04/28/22 0600 **FOLLOWED BY** amiodarone (NEXTERONE PREMIX) 360-4.14 MG/200ML-% (1.8 mg/mL) IV infusion, 30 mg/hr, Intravenous, Continuous, Foust, Katy L, NP   Chlorhexidine Gluconate Cloth 2 % PADS 6 each, 6 each, Topical, Daily, Pabon, Diego F, MD, 6 each at 04/27/22 0857   Chlorhexidine Gluconate Cloth 2 % PADS 6 each, 6 each, Topical, Q0600, Pabon, Diego F, MD, 6 each at 04/28/22 0546   [DISCONTINUED] diphenhydrAMINE (BENADRYL) 12.5 MG/5ML elixir 12.5 mg, 12.5 mg, Oral, Q6H PRN **OR** diphenhydrAMINE (BENADRYL) injection 12.5 mg, 12.5 mg,  Intravenous, Q6H PRN, Pabon, Diego F, MD   enoxaparin (LOVENOX) injection 40 mg, 40 mg, Subcutaneous, Q24H, Pabon, Diego F, MD, 40 mg at 04/27/22 0847   feeding supplement (OSMOLITE 1.2 CAL) liquid 1,000 mL, 1,000 mL, Per Tube, Continuous, Tylene Fantasia, PA-C, Last Rate: 30 mL/hr at 04/27/22 1027, 1,000 mL at 04/27/22 1027   fiber supplement (BANATROL TF) liquid 60 mL, 60 mL, Per Tube, BID, Tylene Fantasia, PA-C, 60 mL at 04/27/22 2144   fluconazole (DIFLUCAN) IVPB 400 mg, 400 mg, Intravenous, Q24H, Lorin Picket, RPH, Last Rate: 100 mL/hr at 04/27/22 1651, 400 mg at 04/27/22 1651   free water 30 mL, 30 mL, Per Tube, Q4H, Pabon, Diego F, MD, 30 mL at 04/28/22 0409   furosemide (LASIX) injection 40 mg, 40 mg, Intravenous, BID, Ottie Glazier, MD, 40 mg at 04/27/22 1652   hydrALAZINE (APRESOLINE) injection  10 mg, 10 mg, Intravenous, Q2H PRN, Pabon, Diego F, MD, 10 mg at 04/18/22 1824   iohexol (OMNIPAQUE) 350 MG/ML injection 75 mL, 75 mL, Intravenous, Once PRN, Pabon, Diego F, MD   ketorolac (TORADOL) 15 MG/ML injection 15 mg, 15 mg, Intravenous, Q6H PRN, Pabon, Diego F, MD, 15 mg at 04/27/22 0103   levothyroxine (SYNTHROID) tablet 50 mcg, 50 mcg, Per Tube, Q0600, Dorothe Pea, RPH, 50 mcg at 04/28/22 0544   menthol-cetylpyridinium (CEPACOL) lozenge 3 mg, 1 lozenge, Oral, PRN, Tylene Fantasia, PA-C, 3 mg at 04/18/22 0519   metoprolol tartrate (LOPRESSOR) 25 mg/10 mL oral suspension 25 mg, 25 mg, Per Tube, BID, Ottie Glazier, MD, 25 mg at 04/27/22 2146   metoprolol tartrate (LOPRESSOR) injection 5 mg, 5 mg, Intravenous, Q6H PRN, Minna Merritts, MD, 5 mg at 04/28/22 0039   morphine (PF) 4 MG/ML injection 2 mg, 2 mg, Intravenous, Q4H PRN, Pabon, Diego F, MD, 2 mg at 04/28/22 0235   naphazoline-glycerin (CLEAR EYES REDNESS) ophth solution 1-2 drop, 1-2 drop, Both Eyes, QID PRN, Pabon, Diego F, MD   ondansetron (ZOFRAN) injection 4 mg, 4 mg, Intravenous, Q6H PRN, Pabon, Diego F, MD,  4 mg at 04/26/22 0600   Oral care mouth rinse, 15 mL, Mouth Rinse, PRN, Pabon, Diego F, MD   oxyCODONE (ROXICODONE) 5 MG/5ML solution 5 mg, 5 mg, Per Tube, Q4H PRN, Pabon, Diego F, MD   pantoprazole (PROTONIX) injection 40 mg, 40 mg, Intravenous, Q12H, Pabon, Diego F, MD, 40 mg at 04/27/22 2146   phenol (CHLORASEPTIC) mouth spray 1 spray, 1 spray, Mouth/Throat, PRN, Tylene Fantasia, PA-C, 1 spray at 04/28/22 0345   piperacillin-tazobactam (ZOSYN) IVPB 3.375 g, 3.375 g, Intravenous, Q8H, Pabon, Diego F, MD, Last Rate: 12.5 mL/hr at 04/28/22 0544, 3.375 g at 04/28/22 0544   prochlorperazine (COMPAZINE) injection 5 mg, 5 mg, Intravenous, Q4H PRN, Pabon, Diego F, MD, 5 mg at 04/26/22 0414    ALLERGIES   Ativan [lorazepam], Azithromycin, Levofloxacin, and Sulfa antibiotics     REVIEW OF SYSTEMS    Review of Systems:  Gen:  Denies  fever, sweats, chills weigh loss  HEENT: Denies blurred vision, double vision, ear pain, eye pain, hearing loss, nose bleeds, sore throat Cardiac:  No dizziness, chest pain or heaviness, chest tightness,edema Resp:   reports dyspnea chronically  Gi: Denies swallowing difficulty, stomach pain, nausea or vomiting, diarrhea, constipation, bowel incontinence Gu:  Denies bladder incontinence, burning urine Ext:   Denies Joint pain, stiffness or swelling Skin: Denies  skin rash, easy bruising or bleeding or hives Endoc:  Denies polyuria, polydipsia , polyphagia or weight change Psych:   Denies depression, insomnia or hallucinations   Other:  All other systems negative   VS: BP 114/75 (BP Location: Left Arm)   Pulse 76   Temp 98.3 F (36.8 C) (Oral)   Resp 20   Ht 5' (1.524 m)   Wt 60.2 kg   SpO2 95%   BMI 25.92 kg/m      PHYSICAL EXAM    GENERAL:NAD, no fevers, chills, no weakness no fatigue HEAD: Normocephalic, atraumatic.  EYES: Pupils equal, round, reactive to light. Extraocular muscles intact. No scleral icterus.  MOUTH: Moist mucosal  membrane. Dentition intact. No abscess noted.  EAR, NOSE, THROAT: Clear without exudates. No external lesions.  NECK: Supple. No thyromegaly. No nodules. No JVD.  PULMONARY: decreased breath sounds with mild rhonchi worse at bases bilaterally. + r chest tube CARDIOVASCULAR: S1 and S2. Regular  rate and rhythm. No murmurs, rubs, or gallops. No edema. Pedal pulses 2+ bilaterally.  GASTROINTESTINAL: Soft, nontender, nondistended. No masses. Positive bowel sounds. No hepatosplenomegaly. +JP drain left abd. MUSCULOSKELETAL: No swelling, clubbing, or edema. Range of motion full in all extremities.  NEUROLOGIC: Cranial nerves II through XII are intact. No gross focal neurological deficits. Sensation intact. Reflexes intact.  SKIN: No ulceration, lesions, rashes, or cyanosis. Skin warm and dry. Turgor intact.  PSYCHIATRIC: Mood, affect within normal limits. The patient is awake, alert and oriented x 3. Insight, judgment intact.       IMAGING   '@IMAGES'$ @   ASSESSMENT/PLAN   Bilateral pleural effusions s/p paraesophageal hernia repair with esophageal leak.      Reviewed CT chest with bilateral pleural effusions.  Patient has adequate renal function and we can attempt to diurese more.  I have added lasix 40 IV BID.      - will culture pleural effusion with gram stain and perform additional testing with cel count and diff, pH , microbiology.   - There is hypoalbuminemia which may be contributing to this and is likely due to poor nourishment over past few weeks.     -MRSA pcr- if positive need to add vanco until micro returns   -Procalcitonin trend   - Zosyn and fluconazole to be continued    - WBC count trending down and renal function is normal    Chest tube management   - poorly conducting pleural drain with no output overnight despite CXR with moderate effusion.  Flushed with 30cc saline no resitance but not aspirating well on -20 suction.  There is no air leak with forced expiratory maneuver.   There is no pain around chest tube insertion site.    Chronic centirlobular emphysema with COPD   No signs of acute exacerbation    Hypoalbuminemia      - RD nutrition consult        -contributing to pleural effusion         Thank you for allowing me to participate in the care of this patient.   Patient/Family are satisfied with care plan and all questions have been answered.    Provider disclosure: Patient with at least one acute or chronic illness or injury that poses a threat to life or bodily function and is being managed actively during this encounter.  All of the below services have been performed independently by signing provider:  review of prior documentation from internal and or external health records.  Review of previous and current lab results.  Interview and comprehensive assessment during patient visit today. Review of current and previous chest radiographs/CT scans. Discussion of management and test interpretation with health care team and patient/family.   This document was prepared using Dragon voice recognition software and may include unintentional dictation errors.     Ottie Glazier, M.D.  Division of Pulmonary & Critical Care Medicine

## 2022-04-28 NOTE — Progress Notes (Signed)
CROSS COVER NOTE  NAME: Kelly Wood MRN: [id number] DOB : 1940-12-26 ATTENDING PHYSICIAN: Jules Husbands, MD    Date of Service   04/28/2022   HPI/Events of Note   Notified by RN of tachycardia, HR in 140s and 150s BP 113/74, '5mg'$  IV metoprolol x2 given and ineffective. BP now soft at 102/69 (SBP has been 130-140), Amiodarone infusion restarted.  Interventions   Assessment/Plan:  Supraventricular Tachycardia EKG--> SVT HR 140 5 mg IV metoprolol x 2 BP 102/69 Amiodarone bolus + gtt       To reach the provider On-Call:   7AM- 7PM see care teams to locate the attending and reach out to them via [email or web address] Password: [email or web address] 7PM-7AM contact night-coverage If you still have difficulty reaching the appropriate provider, please page the [facility] (Director on Call) for [facility] on amion for assistance  This document was prepared using Systems analyst and may include unintentional dictation errors.  Neomia Glass DNP, MBA, FNP-BC Nurse Practitioner [facility] Pager [phone]

## 2022-04-28 NOTE — Progress Notes (Signed)
Notified by [facility] patient placement that patient has a bed at [facility]; ICCU Room [phone]. Report given to Lelon Frohlich, RN on receiving unit and MD Pabon notified of update via secure chat. Patient's husband, Laverna Peace updated via phone call on patient transfer. EMTALA and Med Necessity forms completed. All personal belongings sent with patient to [facility].  Earleen Reaper, RN

## 2022-04-28 NOTE — Progress Notes (Signed)
[place] Hospital Day(s): 12.   Post op day(s): 13 Days Post-Op.   Interval History:  Patient seen and examined Continues to have intermittent SVT; overnight as well; improved this AM She is in much brighter spirits this AM She is more distended this morning; still with diarrhea No fever, chills, nausea, emesis Leukocytosis improved some this AM; now 22.5K Hgb stable at 9.6 Renal function normal; sCr - 0.77; UO - 1200 ccs Stable hyponatremia to 132 Drain present; now intra-abdominal; 60 ccs; output grossly serous Chest tube present; right; ~15 ccs recorded last 24 hours; serous; no air leak -- Flushed this AM by Dr A; discussed pleural fluid results  She is NPO Continues on Zosyn + Fluconazole  Pending potential transfer to [facility]; on waiting list   Vital signs in last 24 hours: [min-max] current  Temp:  [97.8 F (36.6 C)-99.8 F (37.7 C)] 98.3 F (36.8 C) (01/24 0550) Pulse Rate:  [75-141] 76 (01/24 0657) Resp:  [18-20] 20 (01/24 0550) BP: (102-145)/(64-90) 114/75 (01/24 0657) SpO2:  [95 %-100 %] 95 % (01/24 0550)     Height: 5' (152.4 cm) Weight: 60.2 kg BMI (Calculated): 25.92   Intake/Output last 2 shifts:  01/23 0701 - 01/24 0700 In: 572.8 [NG/GT:322.8; IV Piggyback:250] Out: 19 [Urine:1000; Drains:60; Chest Tube:15]   Physical Exam:  Constitutional: alert, cooperative and no distress  Chest: Right sided chest tube; site CDI; minimal serous output; no air leak Respiratory: breathing non-labored at rest; on [place] Cardiovascular: regular rate and sinus rhythm  Gastrointestinal: soft, non-tender, she is distended, no rebound/guarding. Mediastinal drain; minimal serous output Integumentary: Laparoscopic incisions are CDI; no erythema  Labs:     Latest Ref Rng & Units 04/28/2022    6:04 AM 04/27/2022    3:45 AM 04/26/2022    3:00 AM  CBC  WBC 4.0 - 10.5 K/uL 22.5  24.3  28.4   Hemoglobin 12.0 - 15.0 g/dL 9.6  9.4  9.8    Hematocrit 36.0 - 46.0 % 29.5  29.5  30.9   Platelets 150 - 400 K/uL 839  644  640       Latest Ref Rng & Units 04/28/2022    6:04 AM 04/27/2022    3:45 AM 04/26/2022    3:00 AM  CMP  Glucose 70 - 99 mg/dL 122  100  132   BUN 8 - 23 mg/dL '23  19  19   '$ Creatinine 0.44 - 1.00 mg/dL 0.77  0.56  0.50   Sodium 135 - 145 mmol/L 132  130  129   Potassium 3.5 - 5.1 mmol/L 4.1  3.9  4.7   Chloride 98 - 111 mmol/L 95  93  95   CO2 22 - 32 mmol/L '25  27  26   '$ Calcium 8.9 - 10.3 mg/dL 7.9  7.8  7.7   Total Protein 6.5 - 8.1 g/dL 6.5  6.1  6.0   Total Bilirubin 0.3 - 1.2 mg/dL 0.6  0.5  0.4   Alkaline Phos 38 - 126 U/L 182  171  184   AST 15 - 41 U/L 25  32  38   ALT 0 - 44 U/L 28  36  44     Imaging studies: No new pertinent imaging studies   Assessment/Plan:  82 y.o. female 13 Days Post-Op s/p robotic assisted laparoscopic paraesophageal hernia repair complicated by esophageal leak requiring immediate take back for robotic assisted primary repair of esophageal perforation, placement of  mediastinal drain, feeding jejunostomy placement, and right chest tube placements.    - Continue NPO status; did poorly with PO intake  - Continue enteric feeding via jejunostomy for now; would hold at 30 ml/hr given increase in distension; liquids only; NO PILLS via jejunostomy  - Continue chest tube; monitor and record output - Appreciate Dr Lanney Gins assistance  - Continue Blake drain; now intra-abdominal; monitor and record output  - Continue IV Abx (Zosyn)   - Continue antifungal (Fluconazole)  - Monitor abdominal examination - Pain control prn; antiemetics prn - Can mobilize with therapies; current recommendations are inpatient rehab - Appreciate medicine/PCCM for assistance   - Dr Dahlia Byes has reviewed case with thoracic surgery (Dr Laverta Baltimore) at [facility]; accepted and on wait-list.  All of the above findings and recommendations were discussed with the patient, patient's family, and the medical team, and all  of patient's and family's questions were answered to their expressed satisfaction.  -- Edison Simon, PA-C [facility] 04/28/2022, 7:28 AM M-F: 7am - 4pm

## 2022-04-28 NOTE — Progress Notes (Signed)
Peripherally Inserted Central Catheter Placement  The IV Nurse has discussed with the patient and/or persons authorized to consent for the patient, the purpose of this procedure and the potential benefits and risks involved with this procedure.  The benefits include less needle sticks, lab draws from the catheter, and the patient may be discharged home with the catheter. Risks include, but not limited to, infection, bleeding, blood clot (thrombus formation), and puncture of an artery; nerve damage and irregular heartbeat and possibility to perform a PICC exchange if needed/ordered by physician.  Alternatives to this procedure were also discussed.  Bard Power PICC patient education guide, fact sheet on infection prevention and patient information card has been provided to patient /or left at bedside.    PICC Placement Documentation  PICC Double Lumen 04/06/70 Right Basilic 33 cm 1 cm (Active)  Indication for Insertion or Continuance of Line Vasoactive infusions 04/28/22 1640  Exposed Catheter (cm) 1 cm 04/28/22 1640  Site Assessment Clean, Dry, Intact 04/28/22 1640  Lumen #1 Status Flushed;Saline locked;Blood return noted 04/28/22 1640  Lumen #2 Status Flushed;Saline locked;Blood return noted 04/28/22 1640  Dressing Type Transparent;Securing device 04/28/22 1640  Dressing Status Antimicrobial disc in place 04/28/22 1640  Dressing Intervention New dressing;Other (Comment) 04/28/22 1640  Dressing Change Due 05/05/22 04/28/22 1640   Consent signed by husband at bedside    Kelly Wood 04/28/2022, 4:58 PM

## 2022-04-28 NOTE — Progress Notes (Signed)
Pt HR was 138 at 0032. Lopressor PRN was given IV at 0039. PT HR  sustained in 130s. On call provider notified, one  dose order received for lopressor 5 mg IV. 0430 pt VS  taken, pt HR sustained in 140s. On call provider notified for further evaluation. STAT ECG order which shows SVT. Pt was then started on amiodarone bolus and  drip. Retail banker , RN started the drip.

## 2022-04-28 NOTE — Progress Notes (Signed)
Rounding Note    Patient Name: Kelly Wood Date of Encounter: 04/28/2022  [place] Cardiologist: None   Subjective   Had episode of SVT earlier this morning.  Not yet taking oral, amiodarone drip restarted.   Inpatient Medications    Scheduled Meds:  Chlorhexidine Gluconate Cloth  6 each Topical Daily   Chlorhexidine Gluconate Cloth  6 each Topical Q0600   fiber supplement (BANATROL TF)  60 mL Per Tube BID   free water  30 mL Per Tube Q4H   furosemide  40 mg Intravenous BID   [START ON 04/29/2022] heparin injection (subcutaneous)  5,000 Units Subcutaneous Q12H   levothyroxine  50 mcg Per Tube Q0600   metoprolol tartrate  25 mg Per Tube BID   pantoprazole (PROTONIX) IV  40 mg Intravenous Q12H   Continuous Infusions:  sodium chloride     albumin human 12.5 g (04/28/22 0938)   amiodarone 30 mg/hr (04/28/22 1206)   feeding supplement (OSMOLITE 1.2 CAL) 1,000 mL (04/27/22 1027)   fluconazole (DIFLUCAN) IV 400 mg (04/27/22 1651)   piperacillin-tazobactam (ZOSYN)  IV 3.375 g (04/28/22 0544)   PRN Meds: sodium chloride, albuterol, [DISCONTINUED] diphenhydrAMINE **OR** diphenhydrAMINE, hydrALAZINE, iohexol, ketorolac, menthol-cetylpyridinium, metoprolol tartrate, morphine injection, naphazoline-glycerin, ondansetron (ZOFRAN) IV, mouth rinse, oxyCODONE, phenol, prochlorperazine   Vital Signs    Vitals:   04/28/22 0642 04/28/22 0657 04/28/22 0809 04/28/22 1205  BP: 108/73 114/75 111/74 (!) 142/78  Pulse: 75 76 74 64  Resp:   16 16  Temp:   98.9 F (37.2 C) 99.2 F (37.3 C)  TempSrc:    Oral  SpO2:   95% 100%  Weight:      Height:        Intake/Output Summary (Last 24 hours) at 04/28/2022 1224 Last data filed at 04/28/2022 1062 Gross per 24 hour  Intake 1179.08 ml  Output 1065 ml  Net 114.08 ml      04/24/2022    3:45 AM 04/20/2022    3:34 AM 04/18/2022    5:00 AM  Last 3 Weights  Weight (lbs) 132 lb 11.5 oz 137 lb 12.6 oz 137 lb 12.6 oz  Weight  (kg) 60.2 kg 62.5 kg 62.5 kg      Telemetry    Sinus rhythm- Personally Reviewed  ECG     - Personally Reviewed  Physical Exam   GEN: No acute distress.  Appears fatigued, soft-spoken Neck: NG tube in place Cardiac: RRR, no murmurs, rubs, or gallops.  Respiratory: Diminished breath sounds bilaterally. GI: Soft, nontender, non-distended  MS: No edema; No deformity. Neuro:  Nonfocal  Psych: Normal affect   Labs    High Sensitivity Troponin:  No results for input(s): "TROPONINIHS" in the last 720 hours.   Chemistry Recent Labs  Lab 04/22/22 0558 04/23/22 0526 04/25/22 0548 04/26/22 0300 04/27/22 0345 04/28/22 0600 04/28/22 0604  NA 128*   < > 131* 129* 130*  --  132*  K 3.5   < > 4.7 4.7 3.9  --  4.1  CL 94*   < > 97* 95* 93*  --  95*  CO2 26   < > '25 26 27  '$ --  25  GLUCOSE 165*   < > 113* 132* 100*  --  122*  BUN 20   < > '19 19 19  '$ --  23  CREATININE 0.42*   < > 0.49 0.50 0.56  --  0.77  CALCIUM 7.4*   < > 7.5* 7.7* 7.8*  --  7.9*  MG 2.0  --  2.2  --   --  2.2  --   PROT  --    < > 5.8* 6.0* 6.1*  --  6.5  ALBUMIN  --    < > 1.8* 1.8* 2.1*  --  2.2*  AST  --    < > 43* 38 32  --  25  ALT  --    < > 41 44 36  --  28  ALKPHOS  --    < > 199* 184* 171*  --  182*  BILITOT  --    < > <0.1* 0.4 0.5  --  0.6  GFRNONAA >60   < > >60 >60 >60  --  >60  ANIONGAP 8   < > '9 8 10  '$ --  12   < > = values in this interval not displayed.    Lipids No results for input(s): "CHOL", "TRIG", "HDL", "LABVLDL", "LDLCALC", "CHOLHDL" in the last 168 hours.  Hematology Recent Labs  Lab 04/26/22 0300 04/27/22 0345 04/28/22 0604  WBC 28.4* 24.3* 22.5*  RBC 3.67* 3.49* 3.56*  HGB 9.8* 9.4* 9.6*  HCT 30.9* 29.5* 29.5*  MCV 84.2 84.5 82.9  MCH 26.7 26.9 27.0  MCHC 31.7 31.9 32.5  RDW 15.4 15.6* 15.4  PLT 640* 644* 839*   Thyroid  No results for input(s): "TSH", "FREET4" in the last 168 hours.   BNPNo results for input(s): "BNP", "PROBNP" in the last 168 hours.  DDimer No  results for input(s): "DDIMER" in the last 168 hours.   Radiology    DG Chest Port 1 View  Result Date: 04/28/2022 CLINICAL DATA:  Follow-up pleural effusion and chest tube EXAM: PORTABLE CHEST 1 VIEW COMPARISON:  04/27/2022 FINDINGS: Right chest tube remains in place. Cardiomegaly as seen yesterday. Aortic atherosclerotic calcification is present. No pneumothorax on the right. Mild atelectasis persists in the right lower lung. Moderate atelectasis persists in the left lower lobe. IMPRESSION: Right chest tube remains in place. No pneumothorax on the right. Persistent atelectasis in the lower lungs, left more than right. Electronically Signed   By: Nelson Chimes M.D.   On: 04/28/2022 09:22   DG Chest Port 1 View  Result Date: 04/27/2022 CLINICAL DATA:  Pleural effusion EXAM: PORTABLE CHEST 1 VIEW COMPARISON:  04/23/2022, CT 04/25/2022, chest x-ray 04/19/2022 FINDINGS: Right-sided chest tube remains in place. Residual bilateral pleural effusions and consolidation at the left lung base. Hazy edema or infiltrates in the right thorax. No visible pneumothorax. Cardiomegaly with aortic atherosclerosis and central congestion. Hiatal hernia. IMPRESSION: 1. Right-sided chest tube in place with residual bilateral pleural effusions and consolidation at the left lung base. Hazy edema or infiltrates in the right thorax. Findings do not appear significantly changed as compared with radiograph from 04/23/2022. 2. Cardiomegaly with central congestion. Electronically Signed   By: Donavan Foil M.D.   On: 04/27/2022 15:34   [place] EKG SITE RITE  Result Date: 04/26/2022 If Site Rite image not attached, placement could not be confirmed due to current cardiac rhythm.   Cardiac Studies   TTE 04/2022  1. Left ventricular ejection fraction, by estimation, is 55 to 60%. The  left ventricle has normal function. Left ventricular endocardial border  not optimally defined to evaluate regional wall motion. Left ventricular   diastolic parameters are consistent  with Grade I diastolic dysfunction (impaired relaxation).   2. Right ventricular systolic function is normal. The right ventricular  size is normal. There is  moderately elevated pulmonary artery systolic  pressure. The estimated right ventricular systolic pressure is 93.2 mmHg.   3. Left atrial size was mildly dilated.   4. The mitral valve is normal in structure. Mild mitral valve  regurgitation. No evidence of mitral stenosis. Moderate mitral annular  calcification.   5. The aortic valve is normal in structure. Aortic valve regurgitation is  not visualized. Aortic valve sclerosis is present, with no evidence of  aortic valve stenosis.   Patient Profile     82 y.o. female with history of hypertension, COPD, paraesophageal hernia s/p repair.  SVT being seen for SVT.  Assessment & Plan    SVT -Had another episode of SVT earlier this morning -Currently maintaining sinus rhythm on IV amiodarone -Continue amiodarone drip -Transition to oral metoprolol when patient able to take p.o.  2.  Esophageal hernia s/p repair -Management as per surgical team     Signed, Kate Sable, MD  04/28/2022, 12:24 PM

## 2022-04-28 NOTE — Progress Notes (Signed)
   04/28/22 0032  Assess: MEWS Score  Temp 98.3 F (36.8 C)  BP 112/87  MAP (mmHg) 96  Pulse Rate (!) 138  ECG Heart Rate (!) 139  Resp 18  Level of Consciousness Alert  SpO2 97 %  O2 Device Nasal Cannula  Assess: MEWS Score  MEWS Temp 0  MEWS Systolic 0  MEWS Pulse 3  MEWS RR 0  MEWS LOC 0  MEWS Score 3  MEWS Score Color Yellow  Assess: if the MEWS score is Yellow or Red  Were vital signs taken at a resting state? Yes  Focused Assessment Change from prior assessment (see assessment flowsheet)  Does the patient meet 2 or more of the SIRS criteria? Yes  Does the patient have a confirmed or suspected source of infection? No  MEWS guidelines implemented *See Row Information* Yes  Treat  MEWS Interventions Escalated (See documentation below)  Pain Scale 0-10  Pain Score 0  Take Vital Signs  Increase Vital Sign Frequency  Yellow: Q 2hr X 2 then Q 4hr X 2, if remains yellow, continue Q 4hrs  Escalate  MEWS: Escalate Yellow: discuss with charge nurse/RN and consider discussing with provider and RRT  Notify: Charge Nurse/RN  Name of Charge Nurse/RN Notified Mar[place]ore, RN  Date Charge Nurse/RN Notified 04/28/22  Time Charge Nurse/RN Notified 0033  Provider Notification  Provider Name/Title Foust, NP  Date Provider Notified 04/28/22  Time Provider Notified 0100  Method of Notification Page  Notification Reason Other (Comment) (Yellow MEWS HR 130s)  Provider response Evaluate remotely  Date of Provider Response 04/28/22  Time of Provider Response 0115  Assess: SIRS CRITERIA  SIRS Temperature  0  SIRS Pulse 1  SIRS Respirations  0  SIRS WBC 0  SIRS Score Sum  1

## 2022-05-01 LAB — BODY FLUID CULTURE W GRAM STAIN
Culture: NO GROWTH
Gram Stain: NONE SEEN

## 2022-05-07 LAB — MISC LABCORP TEST (SEND OUT)
LabCorp test name: 5367
Labcorp test code: 5367

## 2023-02-24 ENCOUNTER — Encounter: Payer: Self-pay | Admitting: Ophthalmology

## 2023-03-02 NOTE — Discharge Instructions (Signed)

## 2023-03-08 ENCOUNTER — Ambulatory Visit: Payer: Medicare HMO | Admitting: Anesthesiology

## 2023-03-08 ENCOUNTER — Other Ambulatory Visit: Payer: Self-pay

## 2023-03-08 ENCOUNTER — Ambulatory Visit
Admission: RE | Admit: 2023-03-08 | Discharge: 2023-03-08 | Disposition: A | Discharge status: Home / Self Care | Payer: Medicare HMO | Source: Ambulatory Visit | Attending: Ophthalmology | Admitting: Ophthalmology

## 2023-03-08 ENCOUNTER — Encounter
Admission: RE | Disposition: A | Discharge status: Home / Self Care | Payer: Self-pay | Source: Ambulatory Visit | Attending: Ophthalmology

## 2023-03-08 DIAGNOSIS — E039 Hypothyroidism, unspecified: Secondary | ICD-10-CM | POA: Insufficient documentation

## 2023-03-08 DIAGNOSIS — J439 Emphysema, unspecified: Secondary | ICD-10-CM | POA: Insufficient documentation

## 2023-03-08 DIAGNOSIS — H2512 Age-related nuclear cataract, left eye: Secondary | ICD-10-CM | POA: Insufficient documentation

## 2023-03-08 DIAGNOSIS — E1136 Type 2 diabetes mellitus with diabetic cataract: Secondary | ICD-10-CM | POA: Insufficient documentation

## 2023-03-08 DIAGNOSIS — Z87891 Personal history of nicotine dependence: Secondary | ICD-10-CM | POA: Insufficient documentation

## 2023-03-08 DIAGNOSIS — I10 Essential (primary) hypertension: Secondary | ICD-10-CM | POA: Diagnosis not present

## 2023-03-08 HISTORY — PX: CATARACT EXTRACTION W/PHACO: SHX586

## 2023-03-08 HISTORY — DX: Presence of external hearing-aid: Z97.4

## 2023-03-08 SURGERY — PHACOEMULSIFICATION, CATARACT, WITH IOL INSERTION
Anesthesia: Topical | Laterality: Left

## 2023-03-08 MED ORDER — MOXIFLOXACIN HCL 0.5 % OP SOLN
OPHTHALMIC | Status: DC | PRN
  Administered 2023-03-08: .2 mL via OPHTHALMIC

## 2023-03-08 MED ORDER — ARMC OPHTHALMIC DILATING DROPS
1.0000 | OPHTHALMIC | Status: DC | PRN
  Administered 2023-03-08 (×3): 1 via OPHTHALMIC

## 2023-03-08 MED ORDER — FENTANYL CITRATE (PF) 100 MCG/2ML IJ SOLN
INTRAMUSCULAR | Status: DC | PRN
  Administered 2023-03-08: 50 ug via INTRAVENOUS

## 2023-03-08 MED ORDER — MIDAZOLAM HCL 2 MG/2ML IJ SOLN
INTRAMUSCULAR | Status: AC
  Filled 2023-03-08: qty 2

## 2023-03-08 MED ORDER — SIGHTPATH DOSE#1 BSS IO SOLN
INTRAOCULAR | Status: DC | PRN
  Administered 2023-03-08: 15 mL via INTRAOCULAR

## 2023-03-08 MED ORDER — SIGHTPATH DOSE#1 NA CHONDROIT SULF-NA HYALURON 40-17 MG/ML IO SOLN
INTRAOCULAR | Status: DC | PRN
  Administered 2023-03-08: 1 mL via INTRAOCULAR

## 2023-03-08 MED ORDER — FENTANYL CITRATE (PF) 100 MCG/2ML IJ SOLN
INTRAMUSCULAR | Status: AC
  Filled 2023-03-08: qty 2

## 2023-03-08 MED ORDER — TETRACAINE HCL 0.5 % OP SOLN
OPHTHALMIC | Status: AC
  Filled 2023-03-08: qty 4

## 2023-03-08 MED ORDER — BRIMONIDINE TARTRATE-TIMOLOL 0.2-0.5 % OP SOLN
OPHTHALMIC | Status: DC | PRN
  Administered 2023-03-08: 1 [drp] via OPHTHALMIC

## 2023-03-08 MED ORDER — SIGHTPATH DOSE#1 BSS IO SOLN
INTRAOCULAR | Status: DC | PRN
  Administered 2023-03-08: 53 mL via OPHTHALMIC

## 2023-03-08 MED ORDER — SIGHTPATH DOSE#1 BSS IO SOLN
INTRAOCULAR | Status: DC | PRN
  Administered 2023-03-08: 1 mL

## 2023-03-08 MED ORDER — TETRACAINE HCL 0.5 % OP SOLN
1.0000 [drp] | OPHTHALMIC | Status: DC | PRN
  Administered 2023-03-08 (×3): 1 [drp] via OPHTHALMIC

## 2023-03-08 SURGICAL SUPPLY — 10 items
ANGLE REVERSE CUT SHRT 25GA (CUTTER) ×1
CATARACT SUITE SIGHTPATH (MISCELLANEOUS) ×1
CYSTOTOME ANGL RVRS SHRT 25G (CUTTER) ×1 IMPLANT
FEE CATARACT SUITE SIGHTPATH (MISCELLANEOUS) ×1 IMPLANT
GLOVE BIOGEL PI IND STRL 8 (GLOVE) ×1 IMPLANT
GLOVE SURG LX STRL 8.0 MICRO (GLOVE) ×1 IMPLANT
LENS IOL TECNIS EYHANCE 19.0 (Intraocular Lens) IMPLANT
NDL FILTER BLUNT 18X1 1/2 (NEEDLE) ×1 IMPLANT
NEEDLE FILTER BLUNT 18X1 1/2 (NEEDLE) ×1
SYR 3ML LL SCALE MARK (SYRINGE) ×1 IMPLANT

## 2023-03-08 NOTE — Anesthesia Preprocedure Evaluation (Signed)
Anesthesia Evaluation  Patient identified by MRN, date of birth, ID band Patient awake    Reviewed: Allergy & Precautions, H&P , NPO status , Patient's Chart, lab work & pertinent test results, reviewed documented beta blocker date and time   Airway Mallampati: II  TM Distance: >3 FB Neck ROM: full    Dental no notable dental hx. (+) Teeth Intact   Pulmonary shortness of breath, COPD, former smoker   Pulmonary exam normal breath sounds clear to auscultation       Cardiovascular Exercise Tolerance: Good hypertension, negative cardio ROS  Rhythm:regular Rate:Normal     Neuro/Psych  Neuromuscular disease  negative psych ROS   GI/Hepatic Neg liver ROS, hiatal hernia,GERD  ,,  Endo/Other  diabetesHypothyroidism    Renal/GU      Musculoskeletal   Abdominal   Peds  Hematology  (+) Blood dyscrasia, anemia , HIV  Anesthesia Other Findings   Reproductive/Obstetrics negative OB ROS                             Anesthesia Physical Anesthesia Plan  ASA: 3  Anesthesia Plan: MAC   Post-op Pain Management:    Induction:   PONV Risk Score and Plan:   Airway Management Planned:   Additional Equipment:   Intra-op Plan:   Post-operative Plan:   Informed Consent: I have reviewed the patients History and Physical, chart, labs and discussed the procedure including the risks, benefits and alternatives for the proposed anesthesia with the patient or authorized representative who has indicated his/her understanding and acceptance.       Plan Discussed with: CRNA  Anesthesia Plan Comments:        Anesthesia Quick Evaluation

## 2023-03-08 NOTE — Anesthesia Postprocedure Evaluation (Signed)
Anesthesia Post Note  Patient: Kelly Wood  Procedure(s) Performed: CATARACT EXTRACTION PHACO AND INTRAOCULAR LENS PLACEMENT (IOC) LEFT 8.92 00:48.3 (Left)  Patient location during evaluation: PACU Anesthesia Type: MAC Level of consciousness: awake and alert Pain management: pain level controlled Vital Signs Assessment: post-procedure vital signs reviewed and stable Respiratory status: spontaneous breathing, nonlabored ventilation, respiratory function stable and patient connected to nasal cannula oxygen Cardiovascular status: stable and blood pressure returned to baseline Postop Assessment: no apparent nausea or vomiting Anesthetic complications: no   No notable events documented.   Last Vitals:  Vitals:   03/08/23 1004 03/08/23 1008  BP: (!) 144/80 (!) 157/78  Pulse: (!) 56 84  Resp: 18 (!) 23  Temp: (!) 36.1 C 36.5 C  SpO2: 99% 98%    Last Pain:  Vitals:   03/08/23 1008  TempSrc:   PainSc: 0-No pain                 Yevette Edwards

## 2023-03-08 NOTE — Anesthesia Postprocedure Evaluation (Signed)
Anesthesia Post Note  Patient: Troya Cianciulli Orgeron  Procedure(s) Performed: CATARACT EXTRACTION PHACO AND INTRAOCULAR LENS PLACEMENT (IOC) LEFT 8.92 00:48.3 (Left)  Patient location during evaluation: PACU Anesthesia Type: MAC Level of consciousness: awake and alert Pain management: pain level controlled Vital Signs Assessment: post-procedure vital signs reviewed and stable Respiratory status: spontaneous breathing, nonlabored ventilation, respiratory function stable and patient connected to nasal cannula oxygen Cardiovascular status: blood pressure returned to baseline and stable Postop Assessment: no apparent nausea or vomiting Anesthetic complications: no   No notable events documented.   Last Vitals:  Vitals:   03/08/23 1004 03/08/23 1008  BP: (!) 144/80 (!) 157/78  Pulse: (!) 56 84  Resp: 18 (!) 23  Temp: (!) 36.1 C 36.5 C  SpO2: 99% 98%    Last Pain:  Vitals:   03/08/23 1008  TempSrc:   PainSc: 0-No pain                 Yevette Edwards

## 2023-03-08 NOTE — Op Note (Signed)
PREOPERATIVE DIAGNOSIS:  Nuclear sclerotic cataract of the left eye.   POSTOPERATIVE DIAGNOSIS:  Nuclear sclerotic cataract of the left eye.   OPERATIVE PROCEDURE:ORPROCALL@   SURGEON:  Galen Manila, MD.   ANESTHESIA:  Anesthesiologist: Yevette Edwards, MD CRNA: Lanell Matar, CRNA  1.      Managed anesthesia care. 2.     0.70ml of Shugarcaine was instilled following the paracentesis   COMPLICATIONS:  None.   TECHNIQUE:   Stop and chop   DESCRIPTION OF PROCEDURE:  The patient was examined and consented in the preoperative holding area where the aforementioned topical anesthesia was applied to the left eye and then brought back to the Operating Room where the left eye was prepped and draped in the usual sterile ophthalmic fashion and a lid speculum was placed. A paracentesis was created with the side port blade and the anterior chamber was filled with viscoelastic. A near clear corneal incision was performed with the steel keratome. A continuous curvilinear capsulorrhexis was performed with a cystotome followed by the capsulorrhexis forceps. Hydrodissection and hydrodelineation were carried out with BSS on a blunt cannula. The lens was removed in a stop and chop  technique and the remaining cortical material was removed with the irrigation-aspiration handpiece. The capsular bag was inflated with viscoelastic and the Technis [id number] lens was placed in the capsular bag without complication. The remaining viscoelastic was removed from the eye with the irrigation-aspiration handpiece. The wounds were hydrated. The anterior chamber was flushed with BSS and the eye was inflated to physiologic pressure. 0.35ml Vigamox was placed in the anterior chamber. The wounds were found to be water tight. The eye was dressed with Combigan. The patient was given protective glasses to wear throughout the day and a shield with which to sleep tonight. The patient was also given drops with which to begin a drop regimen  today and will follow-up with me in one day. Implant Name Type Inv. Item Serial No. Manufacturer Lot No. LRB No. Used Action  LENS IOL TECNIS EYHANCE 19.0 - [id number] Intraocular Lens LENS IOL TECNIS EYHANCE 19.0 [id number] [employer]  Left 1 Implanted    Procedure(s): CATARACT EXTRACTION PHACO AND INTRAOCULAR LENS PLACEMENT (IOC) LEFT 8.92 00:48.3 (Left)  Electronically signed: Galen Manila 03/08/2023 10:03 AM

## 2023-03-08 NOTE — H&P (Signed)
[facility]   Primary Care Physician:  Marguarite Arbour, MD Ophthalmologist: Dr. Druscilla Brownie  Pre-Procedure History & Physical: HPI:  Kelly Wood is a 82 y.o. female here for cataract surgery.   Past Medical History:  Diagnosis Date   Anemia    Aortic atherosclerosis (HCC)    B12 deficiency    Cancer (HCC)    basal cell   Cataract cortical, senile    COPD (chronic obstructive pulmonary disease) (HCC)    Dyspnea    Emphysema of lung (HCC)    GERD (gastroesophageal reflux disease)    History of hiatal hernia    Hyperlipidemia    Hypertension    Hypothyroidism    Osteoarthritis of left hip    Osteoporosis    Shingles    Wears hearing aid in both ears     Past Surgical History:  Procedure Laterality Date   CATARACT EXTRACTION Right    CHEST TUBE INSERTION Right 04/15/2022   Procedure: CHEST TUBE INSERTION;  Surgeon: Leafy Ro, MD;  Location: [facility] ORS;  Service: General;  Laterality: Right;   COLONOSCOPY N/A 06/23/2016   Procedure: COLONOSCOPY;  Surgeon: Scot Jun, MD;  Location: [facility];  Service: Endoscopy;  Laterality: N/A;   COLONOSCOPY  2007   ESOPHAGOGASTRODUODENOSCOPY  03/03/2021   HARDWARE REMOVAL Right 11/06/2020   Procedure: EXCHANGE OF LAG SCREW, RIGHT HIP;  Surgeon: Christena Flake, MD;  Location: [facility] OR[facility];  Service: Orthopedics;  Laterality: Right;   INSERTION OF MESH  04/07/2021   Procedure: INSERTION OF MESH;  Surgeon: Leafy Ro, MD;  Location: [facility] ORS;  Service: General;;   INTRAMEDULLARY (IM) NAIL INTERTROCHANTERIC Right 12/26/2018   Procedure: INTRAMEDULLARY (IM) NAIL INTERTROCHANTRIC;  Surgeon: Christena Flake, MD;  Location: [facility] OR[facility];  Service: Orthopedics;  Laterality: Right;   IR PERC PLEURAL DRAIN W/INDWELL CATH W/IMG GUIDE  04/19/2022   LAPAROSCOPIC NISSEN FUNDOPLICATION  04/07/2021   Procedure: LAPAROSCOPIC NISSEN FUNDOPLICATION;  Surgeon: Leafy Ro, MD;  Location: [facility] ORS;  Service: General;;   TUBAL LIGATION   1970   XI ROBOTIC ASSISTED PARAESOPHAGEAL HERNIA REPAIR N/A 04/07/2021   Procedure: XI ROBOTIC ASSISTED PARAESOPHAGEAL HERNIA REPAIR with RNFA to assist;  Surgeon: Leafy Ro, MD;  Location: [facility] ORS;  Service: General;  Laterality: N/A;   XI ROBOTIC ASSISTED PARAESOPHAGEAL HERNIA REPAIR N/A 04/15/2022   Procedure: XI ROBOTIC ASSISTED PARAESOPHAGEAL HERNIA REPAIR, RNFA to assist;  Surgeon: Leafy Ro, MD;  Location: [facility] OR[facility];  Service: General;  Laterality: N/A;   XI ROBOTIC ASSISTED PARAESOPHAGEAL HERNIA REPAIR N/A 04/15/2022   Procedure: XI ROBOTIC ASSISTED PARAESOPHAGEAL HERNIA REPAIR;  Surgeon: Leafy Ro, MD;  Location: [facility] OR[facility];  Service: General;  Laterality: N/A;    Prior to Admission medications   Medication Sig Start Date End Date Taking? Authorizing Provider  ACIDOPHILUS LACTOBACILLUS PO Take by mouth daily.   Yes [provider]  albuterol (VENTOLIN HFA) 108 (90 Base) MCG/ACT inhaler Inhale 2 puffs into the lungs every 6 (six) hours as needed for wheezing or shortness of breath.   Yes [provider]  alendronate (FOSAMAX) 70 MG tablet Take 70 mg by mouth once a week. Take with a full glass of water on an empty stomach.   Yes [provider]  ASPIRIN 81 PO Take 81 mg by mouth daily.   Yes [provider]  cholestyramine Lanetta Inch) 4 GM/DOSE powder Take 4 g by mouth daily before breakfast.   Yes [provider]  cyanocobalamin (VITAMIN B12) 1000 MCG/ML injection Inject 1,000 mcg into the muscle every 30 (thirty) days.   Yes [provider]  diltiazem (CARDIZEM) 90 MG tablet Take 90 mg by mouth 2 (two) times daily.   Yes [provider]  hydrochlorothiazide (HYDRODIURIL) 25 MG tablet Take 25 mg by mouth daily.   Yes [provider]  ibuprofen (ADVIL) 200 MG tablet Take 200 mg by mouth every 6 (six) hours as needed for mild pain (pain score 1-3).   Yes [provider]  levothyroxine (SYNTHROID) 125  MCG tablet Take 125 mcg by mouth daily before breakfast.   Yes [provider]  meclizine (ANTIVERT) 25 MG tablet Take 25 mg by mouth 3 (three) times daily as needed for dizziness.   Yes [provider]  Melatonin 3 MG CAPS Take by mouth at bedtime.   Yes [provider]  Multiple Vitamin (MULTIVITAMIN) capsule Take 1 capsule by mouth daily.   Yes [provider]  ondansetron (ZOFRAN) 4 MG tablet Take 4 mg by mouth every 8 (eight) hours as needed for nausea or vomiting.   Yes [provider]  pravastatin (PRAVACHOL) 80 MG tablet Take 80 mg by mouth daily.   Yes [provider]  tamsulosin (FLOMAX) 0.4 MG CAPS capsule Take 0.4 mg by mouth daily.   Yes [provider]    Allergies as of 02/16/2023 - Review Complete 04/17/2022  Allergen Reaction Noted   Ativan [lorazepam] Anxiety 04/19/2022   Azithromycin Diarrhea 01/27/2017   Levofloxacin Diarrhea 03/06/2018   Sulfa antibiotics Rash 06/22/2016    Family History  Problem Relation Age of Onset   Breast cancer Neg Hx     Social History   Socioeconomic History   Marital status: Married    Spouse name: Chanetta Marshall   Number of children: 2   Years of education: Not on file   Highest education level: Not on file  Occupational History   Not on file  Tobacco Use   Smoking status: Former    Current packs/day: 0.00    Types: Cigarettes    Quit date: 10/24/2006    Years since quitting: 16.3   Smokeless tobacco: Never  Vaping Use   Vaping status: Never Used  Substance and Sexual Activity   Alcohol use: Yes    Alcohol/week: 1.0 standard drink of alcohol    Types: 1 Glasses of wine per week    Comment: rarely   Drug use: Never   Sexual activity: Not on file  Other Topics Concern   Not on file  Social History Narrative   Not on file   Social Determinants of Health   Financial Resource Strain: Low Risk  (01/14/2023)   Received from [facility]   Overall  Financial Resource Strain (CARDIA)    Difficulty of Paying Living Expenses: Not hard at all  Food Insecurity: No Food Insecurity (01/14/2023)   Received from [facility]   Hunger Vital Sign    Worried About Running Out of Food in the Last Year: Never true    Ran Out of Food in the Last Year: Never true  Transportation Needs: No Transportation Needs (01/14/2023)   Received from [facility] - Transportation    In the past 12 months, has lack of transportation kept you from medical appointments or from getting medications?: No    Lack of Transportation (Non-Medical): No  Physical Activity: Not on file  Stress: Not on file  Social Connections: Not on file  Intimate Partner Violence: Not At Risk (04/15/2022)   Humiliation, Afraid, Rape, and Kick questionnaire    Fear of Current or Ex-Partner: No    Emotionally Abused: No    Physically Abused: No    Sexually Abused: No    Review of Systems: See HPI, otherwise negative ROS  Physical Exam: BP (!) 157/78   Pulse 61   Temp (!) 97.3 F (36.3 C) (Temporal)   Resp 16   Ht 5' (1.524 m)   Wt 51.3 kg   SpO2 98%   BMI 22.07 kg/m  General:   Alert, cooperative in NAD Head:  Normocephalic and atraumatic. Respiratory:  Normal work of breathing. Cardiovascular:  RRR  Impression/Plan: Kelly Wood is here for cataract surgery.  Risks, benefits, limitations, and alternatives regarding cataract surgery have been reviewed with the patient.  Questions have been answered.  All parties agreeable.   Galen Manila, MD  03/08/2023, 9:35 AM

## 2023-03-08 NOTE — Transfer of Care (Signed)
Immediate Anesthesia Transfer of Care Note  Patient: Kelly Wood  Procedure(s) Performed: CATARACT EXTRACTION PHACO AND INTRAOCULAR LENS PLACEMENT (IOC) LEFT 8.92 00:48.3 (Left)  Patient Location: PACU  Anesthesia Type:MAC  Level of Consciousness: awake, alert , and oriented  Airway & Oxygen Therapy: Patient Spontanous Breathing  Post-op Assessment: Report given to RN, Post -op Vital signs reviewed and stable, and Patient moving all extremities X 4  Post vital signs: Reviewed and stable  Last Vitals:  Vitals Value Taken Time  BP    Temp    Pulse    Resp    SpO2      Last Pain:  Vitals:   03/08/23 0918  TempSrc: Temporal         Complications: No notable events documented.

## 2023-03-14 ENCOUNTER — Encounter: Payer: Self-pay | Admitting: Ophthalmology

## 2023-04-20 ENCOUNTER — Other Ambulatory Visit: Payer: Self-pay | Admitting: Internal Medicine

## 2023-04-20 DIAGNOSIS — Z1231 Encounter for screening mammogram for malignant neoplasm of breast: Secondary | ICD-10-CM

## 2023-04-22 ENCOUNTER — Other Ambulatory Visit: Payer: Self-pay | Admitting: Orthopedic Surgery

## 2023-04-22 DIAGNOSIS — M25551 Pain in right hip: Secondary | ICD-10-CM

## 2023-04-26 ENCOUNTER — Ambulatory Visit
Admission: RE | Admit: 2023-04-26 | Discharge: 2023-04-26 | Disposition: A | Discharge status: Home / Self Care | Payer: Medicare HMO | Source: Ambulatory Visit | Attending: Orthopedic Surgery | Admitting: Orthopedic Surgery

## 2023-04-26 DIAGNOSIS — M25551 Pain in right hip: Secondary | ICD-10-CM | POA: Insufficient documentation

## 2023-04-28 ENCOUNTER — Other Ambulatory Visit: Payer: Self-pay | Admitting: Orthopedic Surgery

## 2023-05-09 ENCOUNTER — Other Ambulatory Visit: Payer: Self-pay

## 2023-05-09 ENCOUNTER — Encounter
Admission: RE | Admit: 2023-05-09 | Discharge: 2023-05-09 | Disposition: A | Discharge status: Home / Self Care | Payer: Medicare HMO | Source: Ambulatory Visit | Attending: Orthopedic Surgery | Admitting: Orthopedic Surgery

## 2023-05-09 VITALS — Ht 60.0 in | Wt 117.0 lb

## 2023-05-09 DIAGNOSIS — I4719 Other supraventricular tachycardia: Secondary | ICD-10-CM

## 2023-05-09 DIAGNOSIS — I10 Essential (primary) hypertension: Secondary | ICD-10-CM

## 2023-05-09 HISTORY — DX: Atherosclerotic heart disease of native coronary artery without angina pectoris: I25.10

## 2023-05-09 HISTORY — DX: Gastro-esophageal reflux disease without esophagitis: K21.9

## 2023-05-09 HISTORY — DX: Pain in right hip: M25.551

## 2023-05-09 NOTE — Patient Instructions (Addendum)
Your procedure is scheduled on: Tuesday 05/17/23 Report to the Registration Desk on the 1st floor of the [facility]. To find out your arrival time, please call [phone] between 1PM - 3PM on: Monday 05/16/23 If your arrival time is 6:00 am, do not arrive before that time as the [facility] entrance doors do not open until 6:00 am.  REMEMBER: Instructions that are not followed completely may result in serious medical risk, up to and including death; or upon the discretion of your surgeon and anesthesiologist your surgery may need to be rescheduled.  Do not eat food after midnight the night before surgery.  No gum chewing or hard candies.  You may however, drink CLEAR liquids up to 2 hours before you are scheduled to arrive for your surgery. Do not drink anything within 2 hours of your scheduled arrival time.  Clear liquids include: - water  - apple juice without pulp - black coffee or tea (Do NOT add milk or creamers to the coffee or tea) Do NOT drink anything that is not on this list.  In addition, your doctor has ordered for you to drink the provided:  Ensure Pre-Surgery Clear Carbohydrate Drink  Drinking this carbohydrate drink up to two hours before surgery helps to reduce insulin resistance and improve patient outcomes. Please complete drinking 2 hours before scheduled arrival time.  One week prior to surgery: Stop Anti-inflammatories (NSAIDS) such as Advil, Aleve, Ibuprofen, Motrin, Naproxen, Naprosyn and Aspirin based products such as Excedrin, Goody's Powder, BC Powder. Stop ANY OVER THE COUNTER supplements until after surgery.  You may however, continue to take Tylenol if needed for pain up until the day of surgery.  Continue taking all of your other prescription medications up until the day of surgery.  ON THE DAY OF SURGERY ONLY TAKE THESE MEDICATIONS WITH SIPS OF WATER:  diltiazem (CARDIZEM) 90 MG  levothyroxine (SYNTHROID) 125 MCG  omeprazole (PRILOSEC) 20 MG    Use inhalers on the day of surgery and bring to the hospital. albuterol (VENTOLIN HFA) 108 (90 Base) MCG/ACT inhaler  TRELEGY ELLIPTA 100-62.5-25 MCG/ACT AEPB   No Alcohol for 24 hours before or after surgery.  No Smoking including e-cigarettes for 24 hours before surgery.  No chewable tobacco products for at least 6 hours before surgery.  No nicotine patches on the day of surgery.  Do not use any "recreational" drugs for at least a week (preferably 2 weeks) before your surgery.  Please be advised that the combination of cocaine and anesthesia may have negative outcomes, up to and including death. If you test positive for cocaine, your surgery will be cancelled.  On the morning of surgery brush your teeth with toothpaste and water, you may rinse your mouth with mouthwash if you wish. Do not swallow any toothpaste or mouthwash.  Use CHG Soap or wipes as directed on instruction sheet.  Do not wear jewelry, make-up, hairpins, clips or nail polish.  For welded (permanent) jewelry: bracelets, anklets, waist bands, etc.  Please have this removed prior to surgery.  If it is not removed, there is a chance that hospital personnel will need to cut it off on the day of surgery.  Do not wear lotions, powders, or perfumes.   Do not shave body hair from the neck down 48 hours before surgery.  Contact lenses, hearing aids and dentures may not be worn into surgery.  Do not bring valuables to the hospital. [facility] is not responsible for any missing/lost belongings or  valuables.   Notify your doctor if there is any change in your medical condition (cold, fever, infection).  Wear comfortable clothing (specific to your surgery type) to the hospital.  After surgery, you can help prevent lung complications by doing breathing exercises.  Take deep breaths and cough every 1-2 hours. Your doctor may order a device called an Incentive Spirometer to help you take deep breaths. When coughing or  sneezing, hold a pillow firmly against your incision with both hands. This is called "splinting." Doing this helps protect your incision. It also decreases belly discomfort.  If you are being admitted to the hospital overnight, leave your suitcase in the car. After surgery it may be brought to your room.  In case of increased patient census, it may be necessary for you, the patient, to continue your postoperative care in the Same Day Surgery department.  If you are being discharged the day of surgery, you will not be allowed to drive home. You will need a responsible individual to drive you home and stay with you for 24 hours after surgery.   If you are taking public transportation, you will need to have a responsible individual with you.  Please call the Pre-admissions Testing Dept. at [phone] if you have any questions about these instructions.  Surgery Visitation Policy:  Patients having surgery or a procedure may have two visitors.  Children under the age of 51 must have an adult with them who is not the patient.  Temporary Visitor Restrictions Due to increasing cases of flu, RSV and COVID-19: Children ages 61 and under will not be able to visit patients in [facility] hospitals under most circumstances.  Inpatient Visitation:    Visiting hours are 7 a.m. to 8 p.m. Up to four visitors are allowed at one time in a patient room. The visitors may rotate out with other people during the day.  One visitor age 26 or older may stay with the patient overnight and must be in the room by 8 p.m.     Preparing for Surgery with CHLORHEXIDINE GLUCONATE (CHG) Soap  Chlorhexidine Gluconate (CHG) Soap  o An antiseptic cleaner that kills germs and bonds with the skin to continue killing germs even after washing  o Used for showering the night before surgery and morning of surgery  Before surgery, you can play an important role by reducing the number of germs on your skin.  CHG  (Chlorhexidine gluconate) soap is an antiseptic cleanser which kills germs and bonds with the skin to continue killing germs even after washing.  Please do not use if you have an allergy to CHG or antibacterial soaps. If your skin becomes reddened/irritated stop using the CHG.  1. Shower the NIGHT BEFORE SURGERY and the MORNING OF SURGERY with CHG soap.  2. If you choose to wash your hair, wash your hair first as usual with your normal shampoo.  3. After shampooing, rinse your hair and body thoroughly to remove the shampoo.  4. Use CHG as you would any other liquid soap. You can apply CHG directly to the skin and wash gently with a scrungie or a clean washcloth.  5. Apply the CHG soap to your body only from the neck down. Do not use on open wounds or open sores. Avoid contact with your eyes, ears, mouth, and genitals (private parts). Wash face and genitals (private parts) with your normal soap.  6. Wash thoroughly, paying special attention to the area where your surgery will be  performed.  7. Thoroughly rinse your body with warm water.  8. Do not shower/wash with your normal soap after using and rinsing off the CHG soap.  9. Pat yourself dry with a clean towel.  10. Wear clean pajamas to bed the night before surgery.  12. Place clean sheets on your bed the night of your first shower and do not sleep with pets.  13. Shower again with the CHG soap on the day of surgery prior to arriving at the hospital.  14. Do not apply any deodorants/lotions/powders.  15. Please wear clean clothes to the hospital.

## 2023-05-10 ENCOUNTER — Encounter
Admission: RE | Admit: 2023-05-10 | Discharge: 2023-05-10 | Disposition: A | Discharge status: Home / Self Care | Payer: Medicare HMO | Source: Ambulatory Visit | Attending: Orthopedic Surgery | Admitting: Orthopedic Surgery

## 2023-05-10 DIAGNOSIS — I10 Essential (primary) hypertension: Secondary | ICD-10-CM | POA: Insufficient documentation

## 2023-05-10 DIAGNOSIS — I4719 Other supraventricular tachycardia: Secondary | ICD-10-CM | POA: Insufficient documentation

## 2023-05-10 DIAGNOSIS — Z01810 Encounter for preprocedural cardiovascular examination: Secondary | ICD-10-CM | POA: Diagnosis present

## 2023-05-17 ENCOUNTER — Encounter: Payer: Self-pay | Admitting: Orthopedic Surgery

## 2023-05-17 ENCOUNTER — Ambulatory Visit: Payer: Medicare HMO | Admitting: Anesthesiology

## 2023-05-17 ENCOUNTER — Other Ambulatory Visit: Payer: Self-pay

## 2023-05-17 ENCOUNTER — Ambulatory Visit: Payer: Medicare HMO | Admitting: Urgent Care

## 2023-05-17 ENCOUNTER — Ambulatory Visit
Admission: RE | Admit: 2023-05-17 | Discharge: 2023-05-17 | Disposition: A | Discharge status: Home / Self Care | Payer: Medicare HMO | Attending: Orthopedic Surgery | Admitting: Orthopedic Surgery

## 2023-05-17 ENCOUNTER — Encounter
Admission: RE | Disposition: A | Discharge status: Home / Self Care | Payer: Self-pay | Source: Home / Self Care | Attending: Orthopedic Surgery

## 2023-05-17 ENCOUNTER — Ambulatory Visit: Payer: Medicare HMO

## 2023-05-17 DIAGNOSIS — J449 Chronic obstructive pulmonary disease, unspecified: Secondary | ICD-10-CM | POA: Insufficient documentation

## 2023-05-17 DIAGNOSIS — Z87891 Personal history of nicotine dependence: Secondary | ICD-10-CM | POA: Insufficient documentation

## 2023-05-17 DIAGNOSIS — S73191A Other sprain of right hip, initial encounter: Secondary | ICD-10-CM | POA: Insufficient documentation

## 2023-05-17 DIAGNOSIS — E119 Type 2 diabetes mellitus without complications: Secondary | ICD-10-CM | POA: Insufficient documentation

## 2023-05-17 DIAGNOSIS — I10 Essential (primary) hypertension: Secondary | ICD-10-CM | POA: Insufficient documentation

## 2023-05-17 DIAGNOSIS — M7061 Trochanteric bursitis, right hip: Secondary | ICD-10-CM | POA: Insufficient documentation

## 2023-05-17 DIAGNOSIS — M719 Bursopathy, unspecified: Secondary | ICD-10-CM | POA: Diagnosis present

## 2023-05-17 DIAGNOSIS — M7631 Iliotibial band syndrome, right leg: Secondary | ICD-10-CM | POA: Insufficient documentation

## 2023-05-17 SURGERY — ARTHROSCOPIC TROCHANTERIC BURSECTOMY
Anesthesia: General | Site: Hip | Laterality: Right

## 2023-05-17 MED ORDER — OXYCODONE HCL 5 MG PO TABS
5.0000 mg | ORAL_TABLET | Freq: Once | ORAL | Status: AC | PRN
  Administered 2023-05-17: 5 mg via ORAL

## 2023-05-17 MED ORDER — ASPIRIN 325 MG PO TBEC
325.0000 mg | DELAYED_RELEASE_TABLET | Freq: Every day | ORAL | 0 refills | Status: AC
Start: 2023-05-17 — End: 2023-06-14

## 2023-05-17 MED ORDER — ACETAMINOPHEN 10 MG/ML IV SOLN
INTRAVENOUS | Status: AC
  Filled 2023-05-17: qty 100

## 2023-05-17 MED ORDER — BUPIVACAINE HCL (PF) 0.5 % IJ SOLN
INTRAMUSCULAR | Status: AC
Start: 2023-05-17 — End: ?
  Filled 2023-05-17: qty 30

## 2023-05-17 MED ORDER — MIDAZOLAM HCL 2 MG/2ML IJ SOLN
INTRAMUSCULAR | Status: DC | PRN
  Administered 2023-05-17: 1 mg via INTRAVENOUS

## 2023-05-17 MED ORDER — MIDAZOLAM HCL 2 MG/2ML IJ SOLN
INTRAMUSCULAR | Status: AC
  Filled 2023-05-17: qty 2

## 2023-05-17 MED ORDER — ONDANSETRON HCL 4 MG/2ML IJ SOLN
INTRAMUSCULAR | Status: AC
  Filled 2023-05-17: qty 2

## 2023-05-17 MED ORDER — FENTANYL CITRATE (PF) 100 MCG/2ML IJ SOLN
25.0000 ug | INTRAMUSCULAR | Status: DC | PRN
  Administered 2023-05-17 (×2): 50 ug via INTRAVENOUS

## 2023-05-17 MED ORDER — ACETAMINOPHEN 10 MG/ML IV SOLN
INTRAVENOUS | Status: DC | PRN
  Administered 2023-05-17: 1000 mg via INTRAVENOUS

## 2023-05-17 MED ORDER — FENTANYL CITRATE (PF) 100 MCG/2ML IJ SOLN
INTRAMUSCULAR | Status: AC
  Filled 2023-05-17: qty 2

## 2023-05-17 MED ORDER — CHLORHEXIDINE GLUCONATE 0.12 % MT SOLN
15.0000 mL | Freq: Once | OROMUCOSAL | Status: AC
  Administered 2023-05-17: 15 mL via OROMUCOSAL

## 2023-05-17 MED ORDER — LACTATED RINGERS IR SOLN
Status: DC | PRN
  Administered 2023-05-17 (×4): 3000 mL

## 2023-05-17 MED ORDER — LIDOCAINE-EPINEPHRINE 1 %-1:100000 IJ SOLN
INTRAMUSCULAR | Status: DC | PRN
  Administered 2023-05-17: 30 mL via INTRAMUSCULAR

## 2023-05-17 MED ORDER — CEFAZOLIN SODIUM-DEXTROSE 2-4 GM/100ML-% IV SOLN
2.0000 g | INTRAVENOUS | Status: AC
  Administered 2023-05-17: 2 g via INTRAVENOUS

## 2023-05-17 MED ORDER — LACTATED RINGERS IV SOLN: INTRAVENOUS | Status: DC

## 2023-05-17 MED ORDER — CEFAZOLIN SODIUM-DEXTROSE 2-4 GM/100ML-% IV SOLN
INTRAVENOUS | Status: AC
  Filled 2023-05-17: qty 100

## 2023-05-17 MED ORDER — ONDANSETRON HCL 4 MG/2ML IJ SOLN
INTRAMUSCULAR | Status: DC | PRN
  Administered 2023-05-17: 4 mg via INTRAVENOUS

## 2023-05-17 MED ORDER — CHLORHEXIDINE GLUCONATE 0.12 % MT SOLN
OROMUCOSAL | Status: AC
Start: 2023-05-17 — End: ?
  Filled 2023-05-17: qty 15

## 2023-05-17 MED ORDER — HYDROCODONE-ACETAMINOPHEN 5-325 MG PO TABS: 1.0000 | ORAL_TABLET | ORAL | 0 refills | Status: AC | PRN

## 2023-05-17 MED ORDER — FENTANYL CITRATE (PF) 100 MCG/2ML IJ SOLN
INTRAMUSCULAR | Status: DC | PRN
  Administered 2023-05-17 (×2): 25 ug via INTRAVENOUS

## 2023-05-17 MED ORDER — ROCURONIUM BROMIDE 100 MG/10ML IV SOLN
INTRAVENOUS | Status: DC | PRN
  Administered 2023-05-17: 50 mg via INTRAVENOUS

## 2023-05-17 MED ORDER — DEXAMETHASONE SODIUM PHOSPHATE 10 MG/ML IJ SOLN
INTRAMUSCULAR | Status: AC
  Filled 2023-05-17: qty 1

## 2023-05-17 MED ORDER — PROPOFOL 10 MG/ML IV BOLUS
INTRAVENOUS | Status: AC
  Filled 2023-05-17: qty 20

## 2023-05-17 MED ORDER — ONDANSETRON HCL 4 MG/2ML IJ SOLN: 4.0000 mg | Freq: Once | INTRAMUSCULAR | Status: DC

## 2023-05-17 MED ORDER — ONDANSETRON 4 MG PO TBDP: 4.0000 mg | ORAL_TABLET | Freq: Three times a day (TID) | ORAL | 0 refills | Status: AC | PRN

## 2023-05-17 MED ORDER — LIDOCAINE-EPINEPHRINE 1 %-1:100000 IJ SOLN
INTRAMUSCULAR | Status: AC
  Filled 2023-05-17: qty 1

## 2023-05-17 MED ORDER — OXYCODONE HCL 5 MG PO TABS
ORAL_TABLET | ORAL | Status: AC
  Filled 2023-05-17: qty 1

## 2023-05-17 MED ORDER — LIDOCAINE HCL (CARDIAC) PF 100 MG/5ML IV SOSY
PREFILLED_SYRINGE | INTRAVENOUS | Status: DC | PRN
  Administered 2023-05-17: 80 mg via INTRAVENOUS

## 2023-05-17 MED ORDER — PROPOFOL 10 MG/ML IV BOLUS
INTRAVENOUS | Status: DC | PRN
Start: 2023-05-17 — End: 2023-05-17
  Administered 2023-05-17: 80 mg via INTRAVENOUS

## 2023-05-17 MED ORDER — OXYCODONE HCL 5 MG/5ML PO SOLN: 5.0000 mg | Freq: Once | ORAL | Status: AC | PRN

## 2023-05-17 MED ORDER — DEXAMETHASONE SODIUM PHOSPHATE 10 MG/ML IJ SOLN
INTRAMUSCULAR | Status: DC | PRN
  Administered 2023-05-17: 10 mg via INTRAVENOUS

## 2023-05-17 MED ORDER — ACETAMINOPHEN 500 MG PO TABS: 1000.0000 mg | ORAL_TABLET | Freq: Three times a day (TID) | ORAL | 2 refills | Status: AC

## 2023-05-17 MED ORDER — ORAL CARE MOUTH RINSE: 15.0000 mL | Freq: Once | OROMUCOSAL | Status: AC

## 2023-05-17 MED ORDER — SUGAMMADEX SODIUM 200 MG/2ML IV SOLN
INTRAVENOUS | Status: DC | PRN
  Administered 2023-05-17: 200 mg via INTRAVENOUS

## 2023-05-17 SURGICAL SUPPLY — 46 items
ADAPTER IRRIG TUBE 2 SPIKE SOL (ADAPTER) ×2 IMPLANT
ANCHOR SUT FBRTK 2.6 SP #5 (Anchor) IMPLANT
BLADE FULL RADIUS 3.5 (BLADE) IMPLANT
BLADE SHAVER 4.5X7 STR FR (MISCELLANEOUS) IMPLANT
BNDG ADH 1X3 SHEER STRL LF (GAUZE/BANDAGES/DRESSINGS) ×6 IMPLANT
BUR BR 5.5 WIDE MOUTH (BURR) IMPLANT
CANNULA TWIST IN 8.25X7CM (CANNULA) ×1 IMPLANT
CHLORAPREP W/TINT 26 (MISCELLANEOUS) ×1 IMPLANT
COOLER POLAR GLACIER W/PUMP (MISCELLANEOUS) IMPLANT
COVER LIGHT HANDLE STERIS (MISCELLANEOUS) IMPLANT
DRAPE C-ARM XRAY 36X54 (DRAPES) ×1 IMPLANT
DRAPE U-SHAPE 47X51 STRL (DRAPES) ×2 IMPLANT
GAUZE SPONGE 4X4 12PLY STRL (GAUZE/BANDAGES/DRESSINGS) ×1 IMPLANT
GAUZE XEROFORM 1X8 LF (GAUZE/BANDAGES/DRESSINGS) ×1 IMPLANT
GLOVE BIO SURGEON STRL SZ7.5 (GLOVE) ×2 IMPLANT
GLOVE BIO SURGEON STRL SZ8 (GLOVE) ×2 IMPLANT
GLOVE BIOGEL PI IND STRL 8 (GLOVE) ×1 IMPLANT
GLOVE INDICATOR 8.0 STRL GRN (GLOVE) ×1 IMPLANT
GLOVE SURG ORTHO 8.0 STRL STRW (GLOVE) ×1 IMPLANT
GLOVE SURG SYN 7.5 E (GLOVE) ×1 IMPLANT
GLOVE SURG SYN 7.5 PF PI (GLOVE) ×1 IMPLANT
GOWN STRL REUS W/ TWL LRG LVL3 (GOWN DISPOSABLE) ×1 IMPLANT
GOWN STRL REUS W/ TWL XL LVL3 (GOWN DISPOSABLE) ×1 IMPLANT
IV NS IRRIG 3000ML ARTHROMATIC (IV SOLUTION) ×4 IMPLANT
KIT ANCHOR FBRTK 2.6 STR (KITS) IMPLANT
KIT TURNOVER KIT A (KITS) ×1 IMPLANT
MANIFOLD NEPTUNE II (INSTRUMENTS) ×1 IMPLANT
MAT ABSORB FLUID 56X50 GRAY (MISCELLANEOUS) ×1 IMPLANT
NDL HYPO 22X1.5 SAFETY MO (MISCELLANEOUS) ×1 IMPLANT
NEEDLE HYPO 22X1.5 SAFETY MO (MISCELLANEOUS) ×1 IMPLANT
PACK ARTHROSCOPY SHOULDER (MISCELLANEOUS) ×1 IMPLANT
PAD ABD DERMACEA PRESS 5X9 (GAUZE/BANDAGES/DRESSINGS) ×1 IMPLANT
PAD WRAPON POLOR MULTI XL (MISCELLANEOUS) IMPLANT
SPONGE T-LAP 18X18 ~~LOC~~+RFID (SPONGE) ×1 IMPLANT
SUT ETHILON 3-0 FS-10 30 BLK (SUTURE) ×1 IMPLANT
SUT VIC AB 2-0 CT2 27 (SUTURE) ×1 IMPLANT
SUTURE EHLN 3-0 FS-10 30 BLK (SUTURE) ×1 IMPLANT
SYR 10ML LL (SYRINGE) ×1 IMPLANT
SYR 20ML LL LF (SYRINGE) ×1 IMPLANT
TRAP FLUID SMOKE EVACUATOR (MISCELLANEOUS) ×1 IMPLANT
TUBE SET DOUBLEFLO INFLOW (TUBING) ×1 IMPLANT
TUBE SET DOUBLEFLO OUTFLOW (TUBING) ×1 IMPLANT
WAND 4.6 50D HIPVAC 50 IFS (SURGICAL WAND) ×1 IMPLANT
WAND WEREWOLF FLOW 90D (MISCELLANEOUS) IMPLANT
WATER STERILE IRR 500ML POUR (IV SOLUTION) ×1 IMPLANT
WRAP-ON POLOR PAD MULTI XL (MISCELLANEOUS) ×1 IMPLANT

## 2023-05-17 NOTE — Transfer of Care (Signed)
Immediate Anesthesia Transfer of Care Note  Patient: Kelly Wood  Procedure(s) Performed: Right endoscopic trochanteric bursectomy and IT band release with gluteus medius repair (Right: Hip)  Patient Location: PACU  Anesthesia Type:General  Level of Consciousness: awake, oriented, and patient cooperative  Airway & Oxygen Therapy: Patient Spontanous Breathing and Patient connected to face mask oxygen  Post-op Assessment: Report given to RN and Post -op Vital signs reviewed and stable  Post vital signs: Reviewed and stable  Last Vitals:  Vitals Value Taken Time  BP 121/59 05/17/23 1249  Temp 97.1   Pulse 69 05/17/23 1254  Resp 19 05/17/23 1254  SpO2 100 % 05/17/23 1254  Vitals shown include unfiled device data.  Last Pain:  Vitals:   05/17/23 0926  TempSrc: Tympanic  PainSc: 0-No pain         Complications: No notable events documented.

## 2023-05-17 NOTE — Discharge Instructions (Signed)
Endoscopic Trochanteric Bursectomy   Post-Op Instructions   1. Bracing or crutches: Crutches will be provided at the time of discharge from the surgery center if you do not already have them. You can use a walker if you prefer.    2. Ice: You may be provided with a device [facility]) that allows you to ice the affected area effectively. Otherwise you can ice manually.    3. Driving:  Plan on not driving for at least one week. Please note that you are advised NOT to drive while taking narcotic pain medications as you may be impaired and unsafe to drive.   4. Activity: Ankle pumps several times an hour while awake to prevent blood clots. Weight bearing: Flat foot weight bearing x 2 weeks. Use walker as for 2 weeks. Avoid standing more than 5 minutes (consecutively) for the first week.  Avoid long distance travel for 2 weeks.   5. Medications:  - You have been provided a prescription for narcotic pain medicine. After surgery, take 1-2 narcotic tablets every 4 hours if needed for severe pain.  - You may take up to 3000mg /day of tylenol (acetaminophen). You can take 1000mg  3x/day. Please check your narcotic. If you have acetaminophen in your narcotic (each tablet will be 325mg ), be careful not to exceed a total of 3000mg /day of acetaminophen.  - A prescription for anti-nausea medication will be provided in case the narcotic medicine causes nausea - take 1 tablet every 6 hours only if nauseated.  - Take naproxen 500mg  twice daily with food.  - Take enteric coated aspirin 325 mg once daily for 2 weeks to prevent blood clots.   6. Bandages: You may remove the bandages after 5 days. Drainage from the bandages (clear/reddish) can frequently occur. If this does occur, you may remove the dressing and apply another sterile dressing. You can shower after bandages are removed.    7. Physical Therapy: not needed   8. Work: May return to full work usually around 2 weeks after 1st post-operative visit. May do  light duty/desk job in approximately 1-2 weeks when off of narcotics, pain is well-controlled, and swelling has decreased. Labor intensive jobs may require 4-6 weeks to return.      9. Post-Op Appointments: Your first post-op appointment will be with Dr. Allena Katz in approximately 2 weeks time.    If you find that they have not been scheduled please call the Orthopaedic Appointment front desk at [phone].

## 2023-05-17 NOTE — Anesthesia Preprocedure Evaluation (Signed)
Anesthesia Evaluation  Patient identified by MRN, date of birth, ID band Patient awake    Reviewed: Allergy & Precautions, H&P , NPO status , Patient's Chart, lab work & pertinent test results, reviewed documented beta blocker date and time   Airway Mallampati: IV  TM Distance: >3 FB Neck ROM: full    Dental no notable dental hx. (+) Dental Advidsory Given, Implants   Pulmonary neg pulmonary ROS, shortness of breath, COPD, former smoker   Pulmonary exam normal breath sounds clear to auscultation       Cardiovascular Exercise Tolerance: Good hypertension, negative cardio ROS Normal cardiovascular exam Rhythm:regular Rate:Normal     Neuro/Psych  Neuromuscular disease negative neurological ROS  negative psych ROS   GI/Hepatic negative GI ROS, Neg liver ROS, hiatal hernia,GERD  Medicated,,  Endo/Other  negative endocrine ROSdiabetesHypothyroidism    Renal/GU      Musculoskeletal   Abdominal   Peds  Hematology negative hematology ROS (+) Blood dyscrasia, anemia   Anesthesia Other Findings Past Medical History: No date: Anemia No date: Aortic atherosclerosis (HCC) No date: B12 deficiency No date: Cancer (HCC)     Comment:  basal cell No date: Cataract cortical, senile No date: COPD (chronic obstructive pulmonary disease) (HCC) No date: Coronary artery disease No date: Dyspnea No date: Emphysema of lung (HCC) No date: GERD (gastroesophageal reflux disease) No date: History of hiatal hernia No date: Hyperlipidemia No date: Hypertension No date: Hypothyroidism No date: LPRD (laryngopharyngeal reflux disease) No date: Osteoarthritis of left hip No date: Osteoporosis No date: Right hip pain No date: Shingles No date: Wears hearing aid in both ears  Past Surgical History: No date: CATARACT EXTRACTION; Right 03/08/2023: CATARACT EXTRACTION W/PHACO; Left     Comment:  Procedure: CATARACT EXTRACTION PHACO AND  INTRAOCULAR               LENS PLACEMENT (IOC) LEFT 8.92 00:48.3;  Surgeon:               Galen Manila, MD;  Location: [facility];                Service: Ophthalmology;  Laterality: Left; 04/15/2022: CHEST TUBE INSERTION; Right     Comment:  Procedure: CHEST TUBE INSERTION;  Surgeon: Leafy Ro, MD;  Location: [facility];  Service: General;                Laterality: Right; 06/23/2016: COLONOSCOPY; N/A     Comment:  Procedure: COLONOSCOPY;  Surgeon: Scot Jun, MD;               Location: [facility];  Service: Endoscopy;                Laterality: N/A; 2007: COLONOSCOPY 03/03/2021: ESOPHAGOGASTRODUODENOSCOPY 11/06/2020: HARDWARE REMOVAL; Right     Comment:  Procedure: EXCHANGE OF LAG SCREW, RIGHT HIP;  Surgeon:               Christena Flake, MD;  Location: [facility];  Service:               Orthopedics;  Laterality: Right; 04/07/2021: INSERTION OF MESH     Comment:  Procedure: INSERTION OF MESH;  Surgeon: Leafy Ro,               MD;  Location: [facility];  Service: General;; 12/26/2018: INTRAMEDULLARY (IM) NAIL INTERTROCHANTERIC; Right     Comment:  Procedure: INTRAMEDULLARY (IM) NAIL INTERTROCHANTRIC;                Surgeon: Christena Flake, MD;  Location: [facility];                Service: Orthopedics;  Laterality: Right; 04/19/2022: IR PERC PLEURAL DRAIN W/INDWELL CATH W/IMG GUIDE 04/07/2021: LAPAROSCOPIC NISSEN FUNDOPLICATION     Comment:  Procedure: LAPAROSCOPIC NISSEN FUNDOPLICATION;  Surgeon:              Leafy Ro, MD;  Location: [facility];  Service:               General;; 1970: TUBAL LIGATION 04/07/2021: XI ROBOTIC ASSISTED PARAESOPHAGEAL HERNIA REPAIR; N/A     Comment:  Procedure: XI ROBOTIC ASSISTED PARAESOPHAGEAL HERNIA               REPAIR with RNFA to assist;  Surgeon: Leafy Ro, MD;              Location: [facility];  Service: General;  Laterality: N/A; 04/15/2022: XI ROBOTIC ASSISTED PARAESOPHAGEAL HERNIA REPAIR; N/A      Comment:  Procedure: XI ROBOTIC ASSISTED PARAESOPHAGEAL HERNIA               REPAIR, RNFA to assist;  Surgeon: Leafy Ro, MD;                Location: [facility];  Service: General;  Laterality: N/A; 04/15/2022: XI ROBOTIC ASSISTED PARAESOPHAGEAL HERNIA REPAIR; N/A     Comment:  Procedure: XI ROBOTIC ASSISTED PARAESOPHAGEAL HERNIA               REPAIR;  Surgeon: Leafy Ro, MD;  Location: [facility]               ORS;  Service: General;  Laterality: N/A;  BMI    Body Mass Index: 22.65 kg/m      Reproductive/Obstetrics negative OB ROS                             Anesthesia Physical Anesthesia Plan  ASA: 2  Anesthesia Plan: General ETT   Post-op Pain Management:    Induction: Intravenous  PONV Risk Score and Plan: 3 and Ondansetron and Dexamethasone  Airway Management Planned: Oral ETT  Additional Equipment:   Intra-op Plan:   Post-operative Plan: Extubation in OR  Informed Consent: I have reviewed the patients History and Physical, chart, labs and discussed the procedure including the risks, benefits and alternatives for the proposed anesthesia with the patient or authorized representative who has indicated his/her understanding and acceptance.     Dental Advisory Given  Plan Discussed with: Anesthesiologist, CRNA and Surgeon  Anesthesia Plan Comments: (Patient consented for risks of anesthesia including but not limited to:  - adverse reactions to medications - damage to eyes, teeth, lips or other oral mucosa - nerve damage due to positioning  - sore throat or hoarseness - Damage to heart, brain, nerves, lungs, other parts of body or loss of life  Patient voiced understanding and assent.)       Anesthesia Quick Evaluation

## 2023-05-17 NOTE — Op Note (Addendum)
DATE OF SURGERY:  05/17/2023   PREOPERATIVE DIAGNOSIS:  1. Right hip trochanteric bursitis 2. Right hip iliotibial band tightness   POSTOPERATIVE DIAGNOSIS:  1. Right hip partial-thickness gluteus medius tear 2. Right hip trochanteric bursitis 3. Right hip iliotibial band tightness   PROCEDURE:  1. Right hip endoscopic gluteus medius and minimus repair  2. Right hip endoscopic trochanteric bursectomy 3. Right hip endoscopic iliotibial band release  SURGEON: Rosealee Albee, MD  ASSISTANT: Sonny Dandy, PA; Meade Maw, PA-S    EBL: 5cc  ANESTHESIA: Gen  IMPLANTS: - Arthrex Knotless Fibertak 2.73mm anchor x 2   INDICATION(S): The patient is a 83 y.o. female who presents with persistent lateral sided hip pain. She had previously undergone R hip IM nail for intertrochanteric femur fracture by Dr. Joice Lofts in 2020. She had persistent lateral sided pain and underwent a lag screw exchange. This did not improve her symptoms. The MRI revealed edema about the trochanteric bursa with tendinosis of the gluteus medius tendons without frank tear.  The patient has failed all non-operative care to date including multiple corticosteroid injections, physical therapy/exercises, medications, and activity modification.  Please see the preoperative notes for further detail.   The patient elected to undergo the above mentioned procedure after detailed explanation of the expected outcomes and recovery path and after discussion of risks, benefits, and alternatives to surgery  OPERATIVE FINDINGS: partial-thickness gluteus medius tear, significant trochanteric bursitis.  OPERATIVE REPORT:   The patient was brought to the operating room and underwent anesthesia. The patient was placed in a supine fashion on the fracture table.  The operative extremity was flexed approximately 10 degrees and abducted approximately 30 degrees.  The foot was internally rotated. All bony prominences were padded.  Appropriate IV  antibiotics were administered.  The patient was prepped and draped in a sterile fashion.  Time-out was performed and landmarks were identified with fluoroscopic assistance.  Needle localization with fluoroscopy was used to make a standard anterolateral portal.  The blunt trocar was inserted deep to the IT band and along the greater trochanter.  The peritrochanteric space was opened with a blunt trocar.  Appropriate positioning was confirmed with fluoroscopy.  A 70 degree knee arthroscope was used for this procedure, and it was inserted.  Next a distal anterolateral portal was established approximately 6cm distal to the anterior portal just anterior to the IT band and greater trochanter. This was also done under needle localization to ensure appropriate trajectory.  A switching stick was inserted and appropriate positioning was confirmed with fluoroscopy.  A cannula was placed over the switching stick.  A shaver was introduced.  Using combination of oscillating shaver and electrocautery wand, the significant amount of trochanteric bursa was excised.  After excision and debridement of the bursa, the gluteal sling, vastus lateralis, and gluteus medius/minimus insertion were well visualized.  There was a partial-thickness gluteus medius tear present as evidenced by increased mobility of the tendon at the footprint.  Given this finding, decision was made to perform gluteus medius repair.  An awl was used to perform a micropuncture of the footprint.  An accessory posterior portal was made under spinal needle localization just proximal to the posterior aspect of the greater trochanter.  An Arthrex 2.6 mm knotless FiberTak anchor was placed through the anterolateral portal along the anterior aspect of the gluteus medius/minimus insertion.  Another anchor was placed through the posterior portal along the posterior aspect of the gluteus medius insertion.  Repair stitch from the anterior anchor  was brought out through the  posterior cannula and the repair stitch from the posterior anchor was brought out of the anterior cannula.  The anterior repair stitch was shuttled through the posterior anchor and vice versa.  Both repair sutures were tensioned and this allowed for excellent compression of the gluteus medius/minimus tendon onto its footprint.  Sutures were cut. Construct was stable to internal/external rotation and slight adduction/abduction.   Next, the leg was then placed in a slightly adducted position.  The most prominent portion of the greater trochanter was adjacent to the IT band.  The IT band was then released via a cruciate type incision using an ArthroCare wand to reduce friction and irritation in this region of prominence.  Arthroscopic fluid was then evacuated from the joint.    Local anesthetic was injected.  Portal sites were closed with 2-0 Vicryl and 3-0 nylon sutures.  Sterile dressing was applied.  The patient was awakened from anesthesia without difficulty and transferred to PACU in stable condition.   Of note, assistance from a PA was essential to performing the surgery.  PA was present for the entire surgery.  PA assisted with patient positioning, retraction, instrumentation, and wound closure. The surgery would have been more difficult and had longer operative time without PA assistance.    POSTOPERATIVE PLAN:  Patient to be discharged home from PACU. FFWB x 2 weeks, and then make progress towards weightbearing as tolerated. ASA 325 mg daily x2 weeks for DVT prophylaxis.  Follow-up with me in approximately 2 weeks for postoperative visit.

## 2023-05-17 NOTE — Evaluation (Addendum)
Physical Therapy Evaluation Patient Details Name: Kelly Wood MRN: [id number] DOB: 09/18/1940 Today's Date: 05/17/2023  History of Present Illness  Pt is an 83 yo female s/p R endoscopic trochanteric bursectomy and IT band release with gluteus medius repair. PMH of COPD, HTN, hiatal hernia, DM, abdominal surgery.  Clinical Impression  Patient alert, agreeable to PT, family at bedside. Endorsed 5/10 R hip pain. At baseline the pt is independent but due to previous prolonged hospital stay, pt has RW, tub transfer bench, SPC, and BSC. Sit <> stand from recliner with RW and CGA, and she ambulated ~45ft. Unable to maintain initial weight bearing orders of FFWB despite time, education, and even assistance to offweight RLE. MD contacted (Dr. Allena Katz) via secure chat and stated PWB of 30-40% would be permitted. Pt did become nauseous during ambulation, seated rest break to contact MD and allow pt to recover. With PWB 30-40% the patient was able to ambulate ~46ft but did fatigue. Stair navigation with CGA and step by step cues, pt/family verbalized understanding of safe technique. Pt with ~50%weight bearing but did improve to 30-40% with time and repetition.  Overall the patient demonstrated deficits (see "PT Problem List") that impede the patient's functional abilities, safety, and mobility and would benefit from skilled PT intervention.          If plan is discharge home, recommend the following: A little help with walking and/or transfers;A little help with bathing/dressing/bathroom;Assistance with cooking/housework;Assist for transportation;Direct supervision/assist for medications management;Help with stairs or ramp for entrance   Can travel by private vehicle        Equipment Recommendations None recommended by PT (pt has RW, SPC, BSC, tub transfer bench, husband rented a [facility])  Recommendations for Other Services       Functional Status Assessment Patient has had a recent decline in their  functional status and demonstrates the ability to make significant improvements in function in a reasonable and predictable amount of time.     Precautions / Restrictions Precautions Precautions: Fall Recall of Precautions/Restrictions: Intact Restrictions Weight Bearing Restrictions Per Provider Order: Yes RLE Weight Bearing Per Provider Order:  (FFWB per chart, but contacted MD who confirmed 30-40% PWB was okay due to pt's difficulty maintaining FFWB.)      Mobility  Bed Mobility               General bed mobility comments: pt up in recliner at start of session/end of session    Transfers                        Ambulation/Gait   Gait Distance (Feet):  (31ft, then additional 70ft) Assistive device: Rolling walker (2 wheels) Gait Pattern/deviations: Step-to pattern       General Gait Details: step by step cueing for RW management and positioning, weight bearing status  Stairs Stairs: Yes Stairs assistance: Contact guard assist Stair Management: One rail Right, Step to pattern, Forwards Number of Stairs: 4 General stair comments: pt with difficulty maintaining weight bearing status, but did improve over time. pt ~50% weight bearing ascending the stairs on RLE. able to teach back correct technique  Wheelchair Mobility     Tilt Bed    Modified Rankin (Stroke Patients Only)       Balance Overall balance assessment: Needs assistance Sitting-balance support: Feet supported Sitting balance-Leahy Scale: Good     Standing balance support: Bilateral upper extremity supported, During functional activity Standing balance-Leahy Scale: Fair  Pertinent Vitals/Pain Pain Assessment Pain Assessment: 0-10 Pain Score: 5  Pain Location: R hip Pain Descriptors / Indicators: Aching, Grimacing, Guarding Pain Intervention(s): Limited activity within patient's tolerance, Monitored during session, Repositioned    Home  Living Family/patient expects to be discharged to:: Private residence Living Arrangements: Spouse/significant other Available Help at Discharge: Family;Available 24 hours/day Type of Home: House Home Access: Stairs to enter Entrance Stairs-Rails: Right Entrance Stairs-Number of Steps: 2   Home Layout: Two level;Able to live on main level with bedroom/bathroom Home Equipment: Rolling Walker (2 wheels);BSC/3in1;Tub bench;Transport chair      Prior Function Prior Level of Function : Independent/Modified Independent                     Extremity/Trunk Assessment   Upper Extremity Assessment Upper Extremity Assessment: Overall WFL for tasks assessed    Lower Extremity Assessment Lower Extremity Assessment:  (able to move BLE against gravity)       Communication        Cognition Arousal: Alert Behavior During Therapy: WFL for tasks assessed/performed   PT - Cognitive impairments: No apparent impairments                                 Cueing       General Comments      Exercises     Assessment/Plan    PT Assessment Patient needs continued PT services  PT Problem List Decreased strength;Decreased range of motion;Decreased activity tolerance;Decreased balance;Decreased mobility;Decreased knowledge of precautions;Pain;Decreased knowledge of use of DME       PT Treatment Interventions DME instruction;Balance training;Gait training;Neuromuscular re-education;Stair training;Functional mobility training;Patient/family education;Therapeutic activities;Therapeutic exercise    PT Goals (Current goals can be found in the Care Plan section)  Acute Rehab PT Goals Patient Stated Goal: to go home PT Goal Formulation: With patient Time For Goal Achievement: 05/31/23 Potential to Achieve Goals: Good    Frequency 7X/week     Co-evaluation               AM-PAC PT "6 Clicks" Mobility  Outcome Measure Help needed turning from your back to your side  while in a flat bed without using bedrails?: A Little Help needed moving from lying on your back to sitting on the side of a flat bed without using bedrails?: A Little Help needed moving to and from a bed to a chair (including a wheelchair)?: A Little Help needed standing up from a chair using your arms (e.g., wheelchair or bedside chair)?: A Little Help needed to walk in hospital room?: A Little Help needed climbing 3-5 steps with a railing? : A Little 6 Click Score: 18    End of Session Equipment Utilized During Treatment: Gait belt Activity Tolerance: Patient tolerated treatment well;Other (comment) (initially limited by nausea) Patient left: in chair;with family/visitor present Nurse Communication: Mobility status PT Visit Diagnosis: Other abnormalities of gait and mobility (R26.89);Difficulty in walking, not elsewhere classified (R26.2);Muscle weakness (generalized) (M62.81);Pain Pain - Right/Left: Right Pain - part of body: Hip    Time: 7628-3151 PT Time Calculation (min) (ACUTE ONLY): 45 min   Charges:   PT Evaluation $PT Eval Low Complexity: 1 Low PT Treatments $Gait Training: 23-37 mins PT General Charges $$ ACUTE PT VISIT: 1 Visit        Olga Coaster PT, DPT 3:41 PM,05/17/23

## 2023-05-17 NOTE — Anesthesia Procedure Notes (Signed)
Procedure Name: Intubation Date/Time: 05/17/2023 11:23 AM  Performed by: Rich Brave, CRNAPre-anesthesia Checklist: Patient identified, Emergency Drugs available, Suction available, Patient being monitored and Timeout performed Patient Re-evaluated:Patient Re-evaluated prior to induction Oxygen Delivery Method: Circle system utilized Preoxygenation: Pre-oxygenation with 100% oxygen Induction Type: IV induction Ventilation: Mask ventilation without difficulty Laryngoscope Size: McGrath and 3 Grade View: Grade I Tube type: Oral Tube size: 7.0 mm Number of attempts: 1 Airway Equipment and Method: Stylet and Video-laryngoscopy Placement Confirmation: ETT inserted through vocal cords under direct vision, positive ETCO2 and breath sounds checked- equal and bilateral Secured at: 20 cm Tube secured with: Tape Dental Injury: Teeth and Oropharynx as per pre-operative assessment

## 2023-05-17 NOTE — H&P (Signed)
Paper H&P to be scanned into permanent record. H&P reviewed. No significant changes noted.

## 2023-05-17 NOTE — Anesthesia Postprocedure Evaluation (Signed)
Anesthesia Post Note  Patient: Kelly Wood  Procedure(s) Performed: Right endoscopic trochanteric bursectomy and IT band release with gluteus medius repair (Right: Hip)  Patient location during evaluation: PACU Anesthesia Type: General Level of consciousness: awake and alert Pain management: pain level controlled Vital Signs Assessment: post-procedure vital signs reviewed and stable Respiratory status: spontaneous breathing, nonlabored ventilation, respiratory function stable and patient connected to nasal cannula oxygen Cardiovascular status: blood pressure returned to baseline and stable Postop Assessment: no apparent nausea or vomiting Anesthetic complications: no  No notable events documented.   Last Vitals:  Vitals:   05/17/23 1305 05/17/23 1330  BP:  124/60  Pulse: 63 (!) 50  Resp: (!) 37 14  Temp:    SpO2: 98% 100%    Last Pain:  Vitals:   05/17/23 1330  TempSrc:   PainSc: Asleep                 Stephanie Coup

## 2023-06-20 IMAGING — RF DG FEMUR 2+V*R*
1 series · 2 of 2 positions shown · non-contrast
Comparison: CT abdomen/pelvis October 08, 2020.

CLINICAL DATA: Surgery.  Screw replacement.

EXAM:
DG C-ARM 1-60 MIN; RIGHT FEMUR 2 VIEWS
FLUOROSCOPY TIME:  Fluoroscopy Time:  15 seconds.
Number of Acquired Spot Images: 2.

[Series 1: dg x-ray · 0.20mm/px · 2 of 2 slices shown]
[im 1/2]
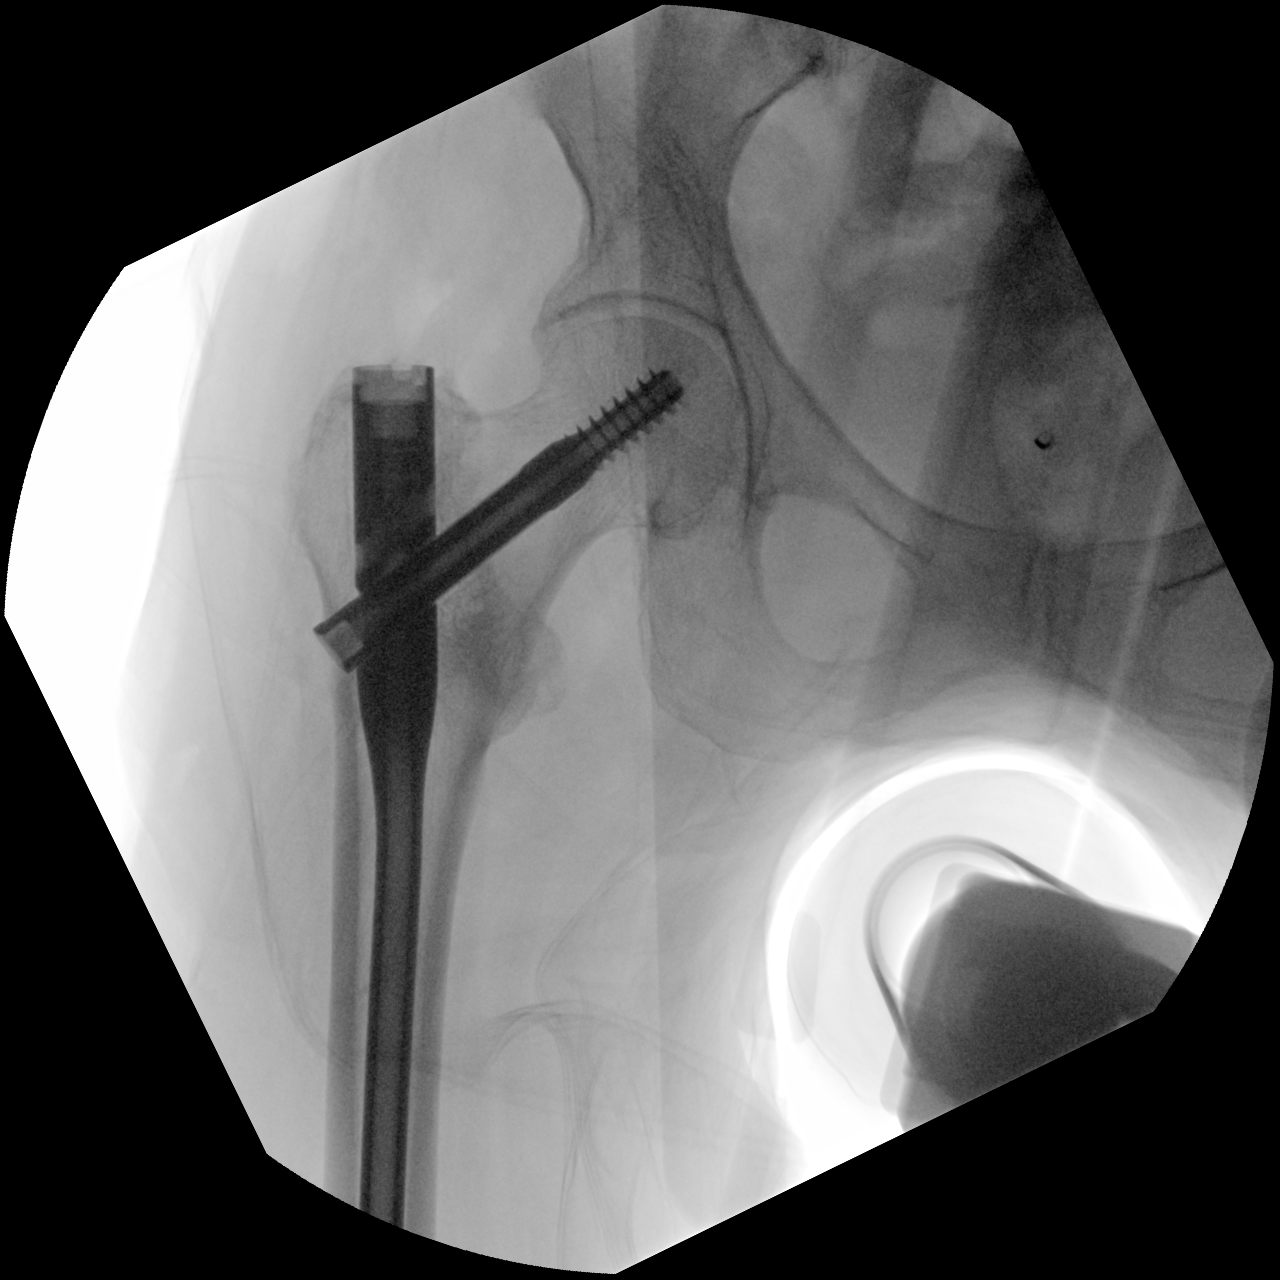
[im 2/2]
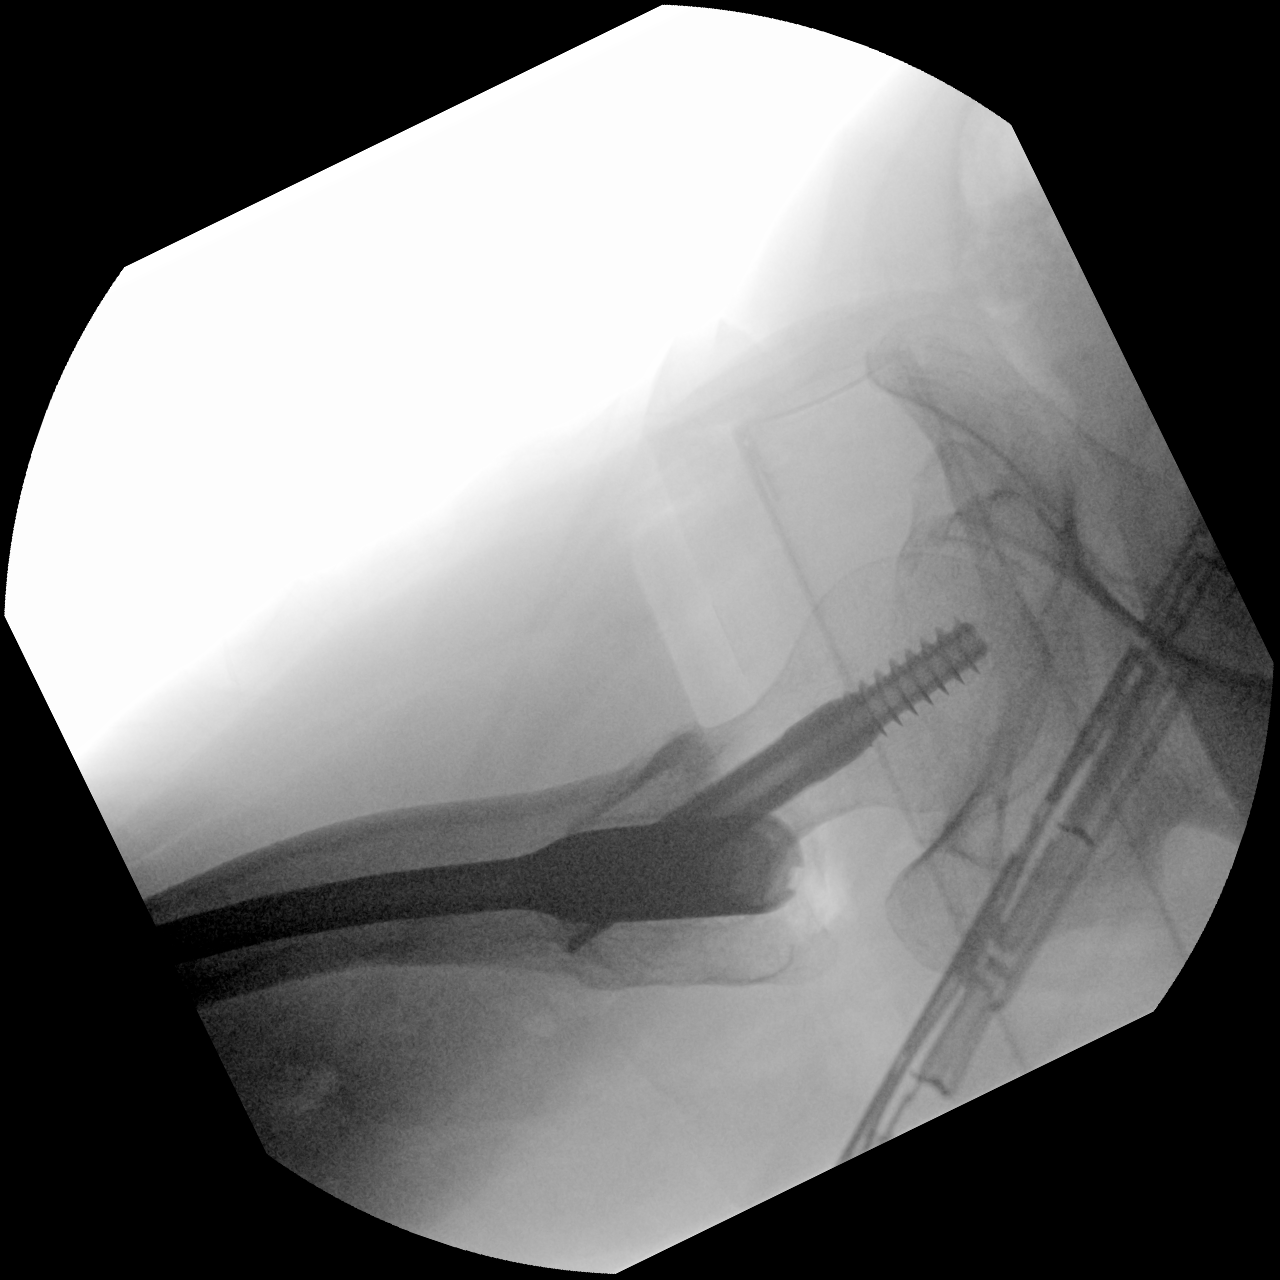

[2 of 2 positions shown; findings below may reference images not displayed]

FINDINGS: Two C-arm fluoroscopic images were obtained intraoperatively and
submitted for post operative interpretation. These images
demonstrate intramedullary rod and lag screw fixation of a chronic
intertrochanteric femur fracture. Reported exchange of the lag
screw. Intramedullary nail is partially imaged. Please see the
performing provider's procedural report for further detail.
IMPRESSION: Intraoperative fluoroscopy, as detailed above.

## 2023-08-19 IMAGING — MG MM DIGITAL SCREENING BILAT W/ TOMO AND CAD
8 series · 8 of 24 positions shown · non-contrast
Comparison: Previous exam(s).

CLINICAL DATA: Screening.

EXAM:
DIGITAL SCREENING BILATERAL MAMMOGRAM WITH TOMOSYNTHESIS AND CAD
TECHNIQUE: Bilateral screening digital craniocaudal and mediolateral oblique
mammograms were obtained. Bilateral screening digital breast
tomosynthesis was performed. The images were evaluated with
computer-aided detection.

[R MLO synth-2D]
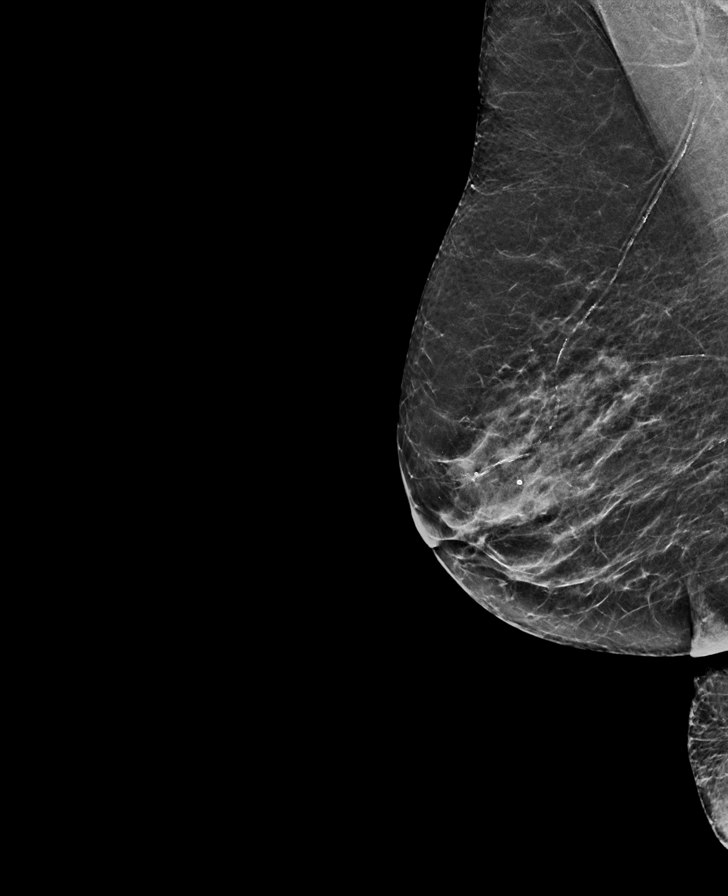

[L MLO synth-2D]
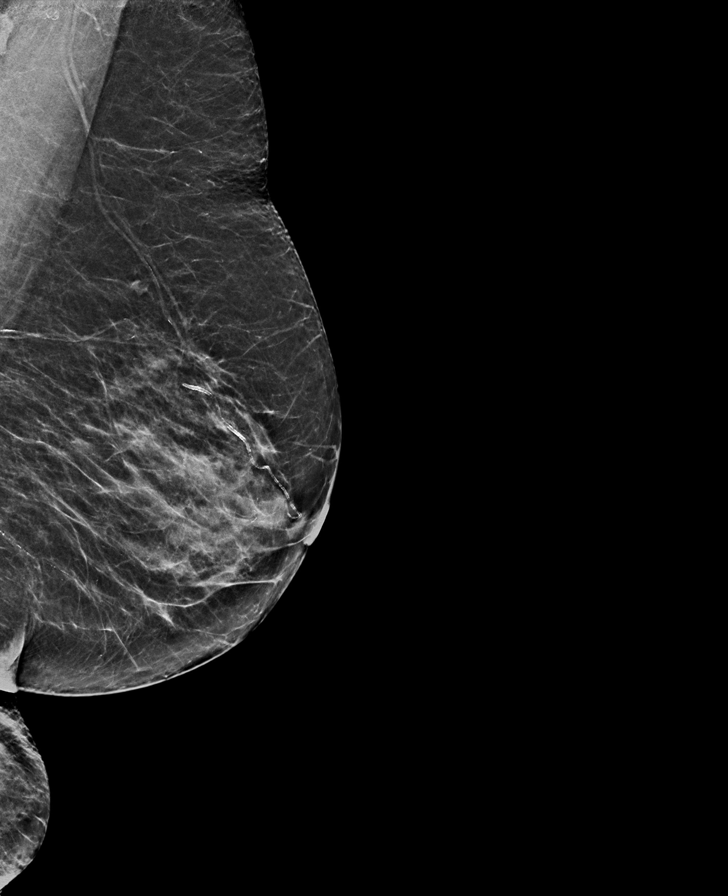

[L CC synth-2D]
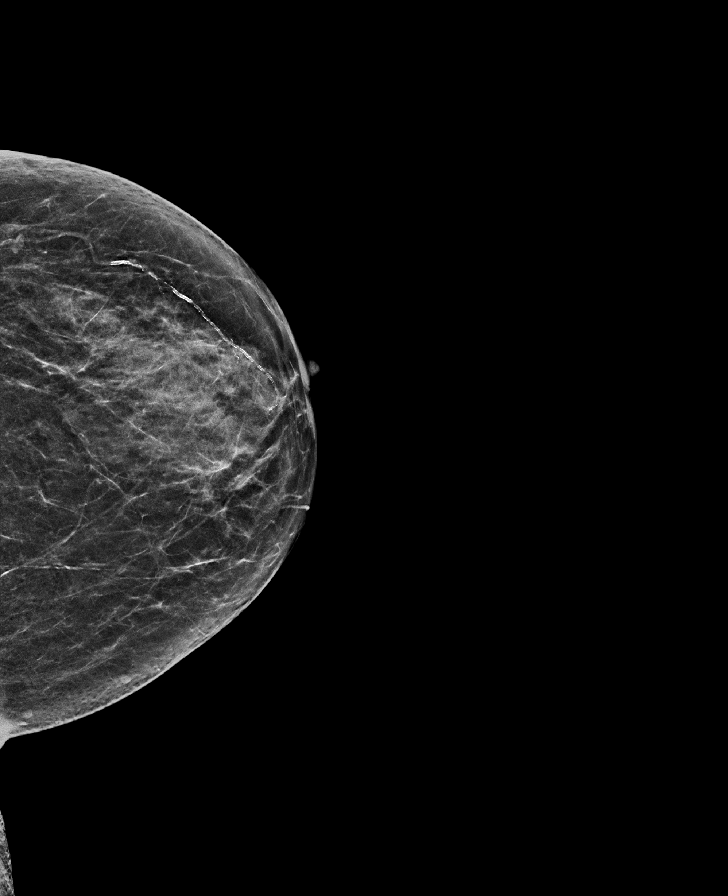

[R CC synth-2D]
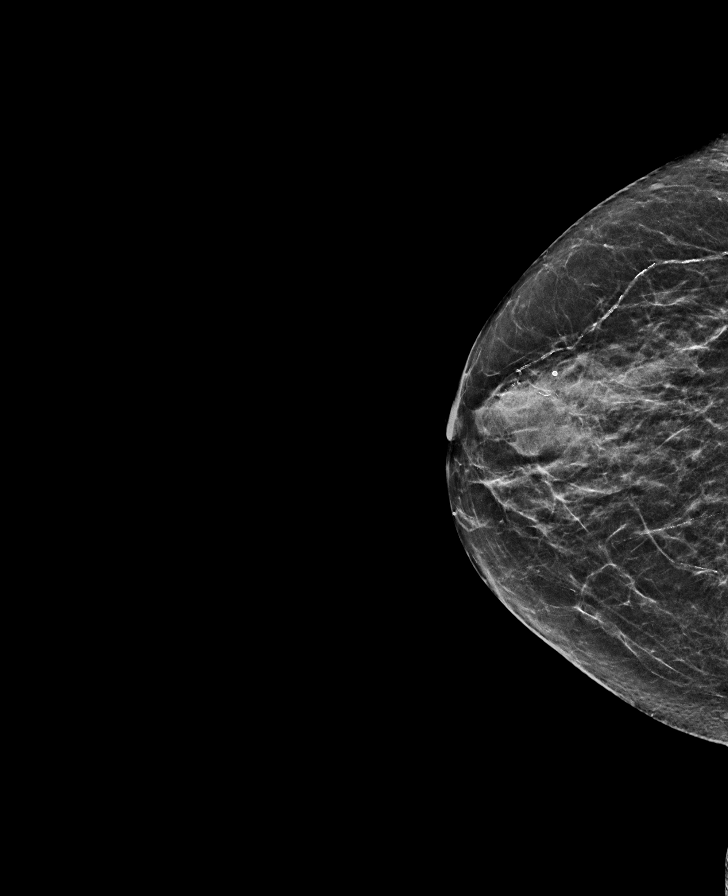

[L MLO tomo · tomo slice 29/57.0]
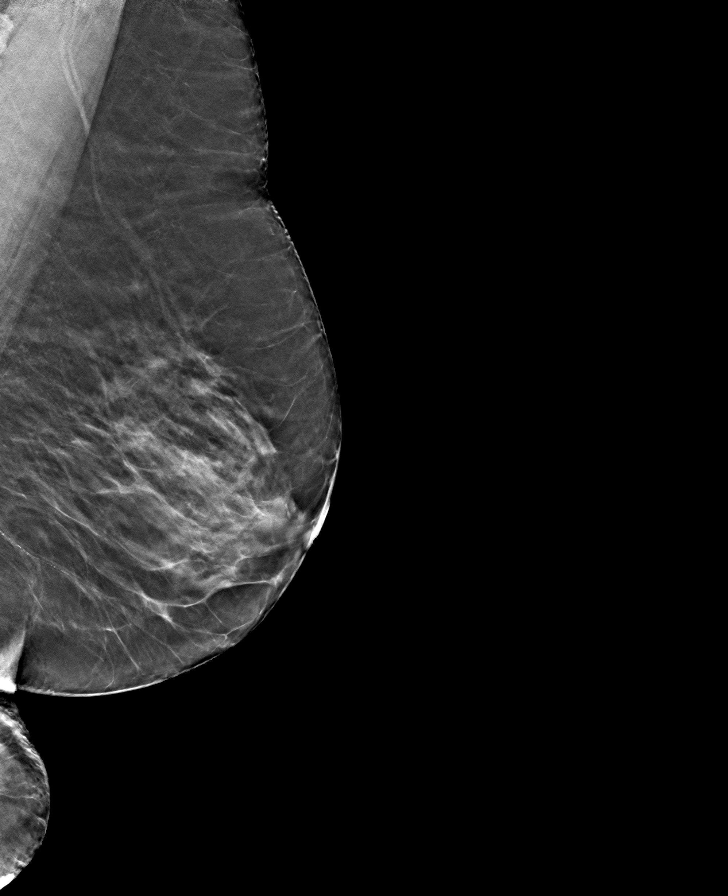

[R CC tomo · tomo slice 24/47.0]
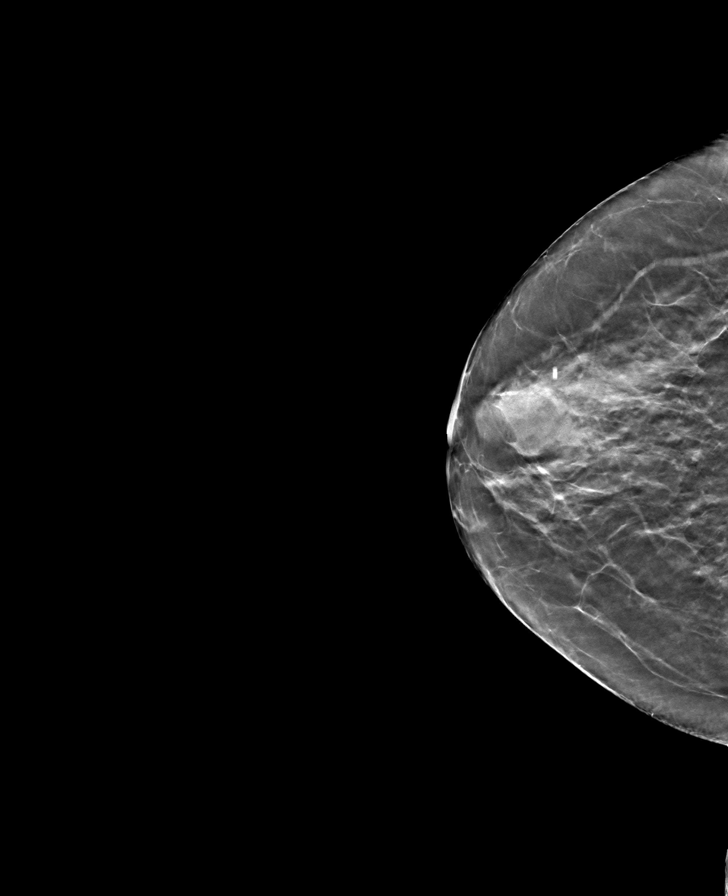

[R MLO tomo · tomo slice 27/53.0]
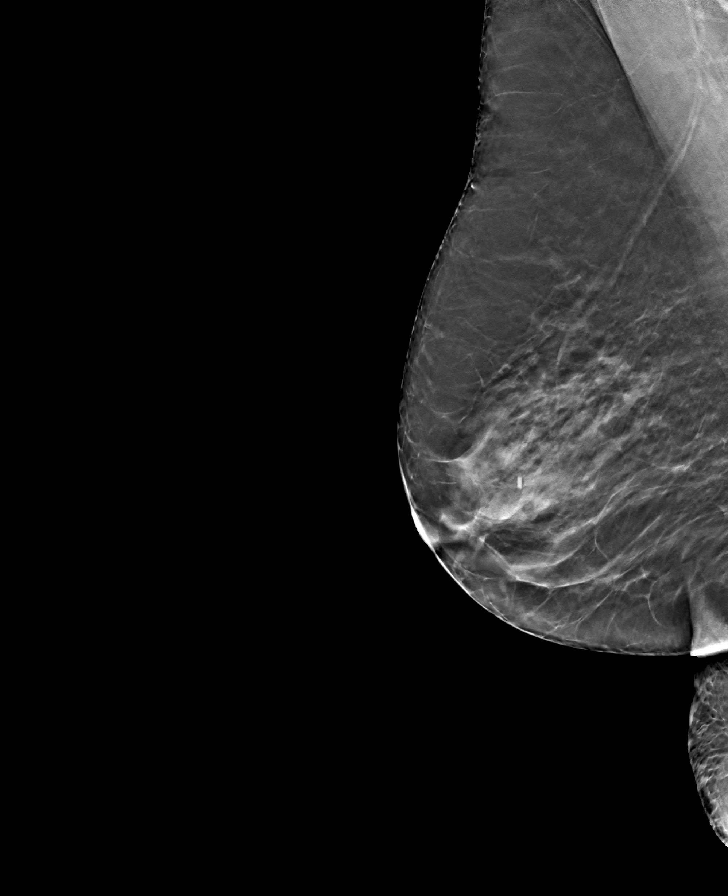

[L CC tomo · tomo slice 27/53.0]
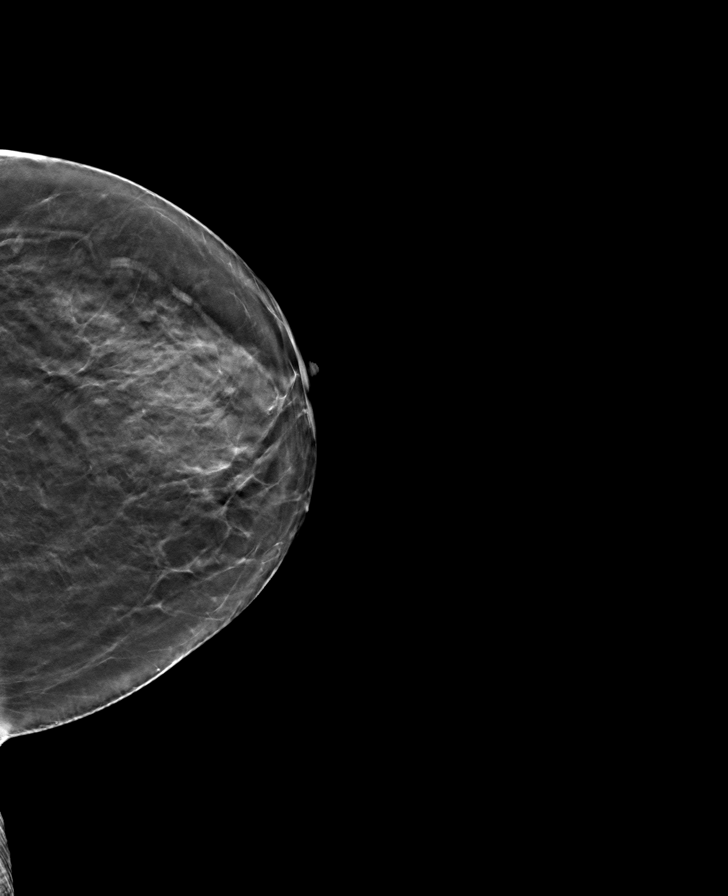

[8 of 24 positions shown; findings below may reference images not displayed]

ACR Breast Density Category c: The breast tissue is heterogeneously
dense, which may obscure small masses.
FINDINGS: There are no findings suspicious for malignancy.
IMPRESSION: No mammographic evidence of malignancy. A result letter of this
screening mammogram will be mailed directly to the patient.

RECOMMENDATION:
Screening mammogram in one year. (Code:[id number]-[id number])

BI-RADS CATEGORY  1: Negative.

## 2023-10-26 ENCOUNTER — Ambulatory Visit (INDEPENDENT_AMBULATORY_CARE_PROVIDER_SITE_OTHER)

## 2023-10-26 ENCOUNTER — Ambulatory Visit
Admission: EM | Admit: 2023-10-26 | Discharge: 2023-10-26 | Disposition: A | Discharge status: Home / Self Care | Attending: Family Medicine | Admitting: Family Medicine

## 2023-10-26 DIAGNOSIS — S90822A Blister (nonthermal), left foot, initial encounter: Secondary | ICD-10-CM

## 2023-10-26 DIAGNOSIS — M79672 Pain in left foot: Secondary | ICD-10-CM

## 2023-10-26 MED ORDER — DOXYCYCLINE HYCLATE 100 MG PO CAPS
100.0000 mg | ORAL_CAPSULE | Freq: Two times a day (BID) | ORAL | 0 refills | Status: AC
Start: 2023-10-26 — End: ?

## 2023-10-26 NOTE — ED Triage Notes (Signed)
 Patient states that she dropped a can on her left foot Friday. She now has bruising and a blister on her left pinky toe.

## 2023-10-26 NOTE — Discharge Instructions (Signed)
 You did not have any fractures or dislocated bones on your xray.  You should see your results in MyChart.   Follow up with a podiatrist like Dr Ashley or the [facility].   Follow up with [facility] in [place].   Call [phone] [address] [place], [place] [address]

## 2023-10-26 NOTE — ED Provider Notes (Signed)
 [facility]    CSN: [id number] Arrival date & time: 10/26/23  1028      History   Chief Complaint Chief Complaint  Patient presents with   Toe Injury    HPI  HPI Winter Jocelyn is a 83 y.o. female.   Adrionna presents for left foot pain after dropping a can on her foot on Friday.  Now there is a blister and her toes are black and blue.  The blister is a lot bigger now.  At night she was swelling in her ankles and feet at night.  Denies significant pain with walking. Did not hear any abnormal pops or sounds.    She missed her physical therapy yesterday for her hip pain due to her foot discomfort and blister.      Past Medical History:  Diagnosis Date   Anemia    Aortic atherosclerosis (HCC)    B12 deficiency    Cancer (HCC)    basal cell   Cataract cortical, senile    COPD (chronic obstructive pulmonary disease) (HCC)    Coronary artery disease    Dyspnea    Emphysema of lung (HCC)    GERD (gastroesophageal reflux disease)    History of hiatal hernia    Hyperlipidemia    Hypertension    Hypothyroidism    LPRD (laryngopharyngeal reflux disease)    Osteoarthritis of left hip    Osteoporosis    Right hip pain    Shingles    Wears hearing aid in both ears     Patient Active Problem List   Diagnosis Date Noted   Atrial tachycardia (HCC) 04/22/2022   Hiatal hernia 04/22/2022   SVT (supraventricular tachycardia) (HCC) 04/21/2022   Esophageal perforation 04/15/2022   S/P repair of paraesophageal hernia 04/07/2021   Pain from implanted hardware 11/06/2020   Lumbar spondylosis 03/10/2020   HTN, goal below 140/80 08/09/2019   Trochanteric bursitis, right hip 06/27/2019   Cataract cortical, senile 05/11/2019   GERD (gastroesophageal reflux disease) 05/11/2019   Hyperlipidemia 05/11/2019   Shingles 05/11/2019   Thyroid disease 05/11/2019   Closed right hip fracture (HCC) 12/26/2018   Anemia due to vitamin B12 deficiency 11/06/2013    Past Surgical  History:  Procedure Laterality Date   CATARACT EXTRACTION Right    CATARACT EXTRACTION W/PHACO Left 03/08/2023   Procedure: CATARACT EXTRACTION PHACO AND INTRAOCULAR LENS PLACEMENT (IOC) LEFT 8.92 00:48.3;  Surgeon: Jaye Fallow, MD;  Location: [facility];  Service: Ophthalmology;  Laterality: Left;   CHEST TUBE INSERTION Right 04/15/2022   Procedure: CHEST TUBE INSERTION;  Surgeon: Jordis Laneta FALCON, MD;  Location: [facility];  Service: General;  Laterality: Right;   COLONOSCOPY N/A 06/23/2016   Procedure: COLONOSCOPY;  Surgeon: Lamar ONEIDA Holmes, MD;  Location: [facility];  Service: Endoscopy;  Laterality: N/A;   COLONOSCOPY  2007   ESOPHAGOGASTRODUODENOSCOPY  03/03/2021   HARDWARE REMOVAL Right 11/06/2020   Procedure: EXCHANGE OF LAG SCREW, RIGHT HIP;  Surgeon: Edie Norleen PARAS, MD;  Location: [facility];  Service: Orthopedics;  Laterality: Right;   INSERTION OF MESH  04/07/2021   Procedure: INSERTION OF MESH;  Surgeon: Jordis Laneta FALCON, MD;  Location: [facility];  Service: General;;   INTRAMEDULLARY (IM) NAIL INTERTROCHANTERIC Right 12/26/2018   Procedure: INTRAMEDULLARY (IM) NAIL INTERTROCHANTRIC;  Surgeon: Edie Norleen PARAS, MD;  Location: [facility];  Service: Orthopedics;  Laterality: Right;   IR PERC PLEURAL DRAIN W/INDWELL CATH W/IMG GUIDE  04/19/2022   LAPAROSCOPIC NISSEN FUNDOPLICATION  04/07/2021   Procedure: LAPAROSCOPIC NISSEN FUNDOPLICATION;  Surgeon: Jordis Laneta FALCON, MD;  Location: [facility];  Service: General;;   TUBAL LIGATION  1970   XI ROBOTIC ASSISTED PARAESOPHAGEAL HERNIA REPAIR N/A 04/07/2021   Procedure: XI ROBOTIC ASSISTED PARAESOPHAGEAL HERNIA REPAIR with RNFA to assist;  Surgeon: Jordis Laneta FALCON, MD;  Location: [facility];  Service: General;  Laterality: N/A;   XI ROBOTIC ASSISTED PARAESOPHAGEAL HERNIA REPAIR N/A 04/15/2022   Procedure: XI ROBOTIC ASSISTED PARAESOPHAGEAL HERNIA REPAIR, RNFA to assist;  Surgeon: Jordis Laneta FALCON, MD;  Location: [facility];  Service: General;   Laterality: N/A;   XI ROBOTIC ASSISTED PARAESOPHAGEAL HERNIA REPAIR N/A 04/15/2022   Procedure: XI ROBOTIC ASSISTED PARAESOPHAGEAL HERNIA REPAIR;  Surgeon: Jordis Laneta FALCON, MD;  Location: [facility];  Service: General;  Laterality: N/A;    OB History   No obstetric history on file.      Home Medications    Prior to Admission medications   Medication Sig Start Date End Date Taking? Authorizing Provider  acetaminophen  (TYLENOL ) 500 MG tablet Take 2 tablets (1,000 mg total) by mouth every 8 (eight) hours. 05/17/23 05/16/24 Yes Tobie Priest, MD  ACIDOPHILUS LACTOBACILLUS PO Take 1 capsule by mouth in the morning.   Yes [provider]  albuterol  (VENTOLIN  HFA) 108 (90 Base) MCG/ACT inhaler Inhale 2 puffs into the lungs every 6 (six) hours as needed for wheezing or shortness of breath.   Yes [provider]  alendronate  (FOSAMAX ) 70 MG tablet Take 70 mg by mouth every Monday. Take with a full glass of water  on an empty stomach.   Yes [provider]  diltiazem  (CARDIZEM ) 90 MG tablet Take 90 mg by mouth 2 (two) times daily.   Yes [provider]  doxycycline  (VIBRAMYCIN ) 100 MG capsule Take 1 capsule (100 mg total) by mouth 2 (two) times daily. 10/26/23  Yes Delana Manganello, DO  hydrochlorothiazide  (HYDRODIURIL ) 25 MG tablet Take 25 mg by mouth daily. 06/12/23  Yes [provider]  ibuprofen  (ADVIL ) 200 MG tablet Take 200 mg by mouth every 8 (eight) hours as needed (pain.).   Yes [provider]  levothyroxine  (SYNTHROID ) 125 MCG tablet Take 62.5-125 mcg by mouth as directed. Take 0.5 tablet (62.5 mcg) by mouth every other day, alternating with 1 tablet (125 mcg) by mouth every other day   Yes [provider]  Magnesium  100 MG TABS Take 100 mg by mouth every other day. In the morning   Yes [provider]  melatonin 5 MG TABS Take 5 mg by mouth at bedtime.   Yes [provider]  Multiple Vitamin (MULTIVITAMIN) capsule Take 1  capsule by mouth in the morning.   Yes [provider]  naphazoline-pheniramine (VISINE) 0.025-0.3 % ophthalmic solution Place 1-2 drops into both eyes 4 (four) times daily as needed for eye irritation.   Yes [provider]  omeprazole (PRILOSEC) 20 MG capsule Take 20 mg by mouth daily before breakfast. 07/28/22  Yes [provider]  pravastatin  (PRAVACHOL ) 80 MG tablet Take 80 mg by mouth every evening.   Yes [provider]  tamsulosin (FLOMAX) 0.4 MG CAPS capsule Take 0.4 mg by mouth at bedtime.   Yes [provider]  TRELEGY ELLIPTA 100-62.5-25 MCG/ACT AEPB Inhale 1 puff into the lungs daily in the afternoon. 01/18/23  Yes [provider]  HYDROcodone -acetaminophen  (NORCO/VICODIN) 5-325 MG tablet Take 1 tablet by mouth every 4 (four) hours as needed for moderate pain (pain score 4-6).  05/17/23   Tobie Priest, MD  ondansetron  (ZOFRAN -ODT) 4 MG disintegrating tablet Take 1 tablet (4 mg total) by mouth every 8 (eight) hours as needed for nausea or vomiting. 05/17/23   Tobie Priest, MD    Family History Family History  Problem Relation Age of Onset   Breast cancer Neg Hx     Social History Social History   Tobacco Use   Smoking status: Former    Current packs/day: 0.00    Types: Cigarettes    Quit date: 10/24/2006    Years since quitting: 17.0   Smokeless tobacco: Never  Vaping Use   Vaping status: Never Used  Substance Use Topics   Alcohol use: Yes    Alcohol/week: 1.0 standard drink of alcohol    Types: 1 Glasses of wine per week    Comment: rarely   Drug use: Never     Allergies   Ativan  [lorazepam ], Azithromycin, Levofloxacin, and Sulfa antibiotics   Review of Systems Review of Systems: :negative unless otherwise stated in HPI.      Physical Exam Triage Vital Signs ED Triage Vitals  Encounter Vitals Group     BP 10/26/23 1049 (!) 146/83     Girls Systolic BP Percentile --      Girls Diastolic BP Percentile --       Boys Systolic BP Percentile --      Boys Diastolic BP Percentile --      Pulse Rate 10/26/23 1049 (!) 58     Resp 10/26/23 1049 17     Temp 10/26/23 1049 (!) 97.3 F (36.3 C)     Temp Source 10/26/23 1049 Oral     SpO2 10/26/23 1049 98 %     Weight --      Height --      Head Circumference --      Peak Flow --      Pain Score 10/26/23 1047 0     Pain Loc --      Pain Education --      Exclude from Growth Chart --    No data found.  Updated Vital Signs BP (!) 146/83 (BP Location: Right Arm)   Pulse (!) 58   Temp (!) 97.3 F (36.3 C) (Oral)   Resp 17   SpO2 98%   Visual Acuity Right Eye Distance:   Left Eye Distance:   Bilateral Distance:    Right Eye Near:   Left Eye Near:    Bilateral Near:     Physical Exam GEN: well appearing female in no acute distress  CVS: well perfused  RESP: speaking in full sentences without pause, no respiratory distress  MSK:  Ankle/Foot, Left: TTP noted at the 5th metatarsal. + visible erythema, swelling, ecchymosis at the 3rd-5th proximal toes but no bony deformity. No notable pes planus deformity.  No evidence of tibiotalar deviation; Range of motion is full in all directions. Strength is 5/5 in all directions. No tenderness at the insertion/body/myotendinous junction of the Achilles tendon; No tenderness on posterior aspects of lateral and medial malleolus; Unremarkable squeeze; Talar dome nontender; Unremarkable calcaneal squeeze; No plantar calcaneal tenderness; No tenderness over the navicular prominence or  over cuboid; No pain at base of 5th MT; Able to walk 4 steps.     UC Treatments / Results  Labs (all labs ordered are listed, but only abnormal results are displayed) Labs Reviewed - No data to display  EKG   Radiology DG Foot Complete Left Result Date: 10/26/2023  CLINICAL DATA:  trauma, proximal 3rd - 5th toe bruising, pain, swelling Patient reports dropping a can on her foot 5 days ago with bruising and blistering of  the small toe. EXAM: LEFT FOOT - COMPLETE 3+ VIEW COMPARISON:  None Available. FINDINGS: The bones are demineralized. No evidence of acute fracture or dislocation. There is prominent dorsal soft tissue swelling in the proximal aspect of the small toe without evidence of associated foreign body or soft tissue emphysema. Moderate degenerative changes at the 1st metatarsophalangeal joint. IMPRESSION: Prominent dorsal soft tissue swelling in the proximal aspect of the small toe without evidence of acute fracture or dislocation. Electronically Signed   By: Elsie Perone M.D.   On: 10/26/2023 11:23     Procedures Incision and Drainage  Date/Time: 10/26/2023 12:37 PM  Performed by: Kriste Berth, DO Authorized by: Kriste Berth, DO   Consent:    Consent obtained:  Verbal   Consent given by:  Patient   Risks, benefits, and alternatives were discussed: yes   Universal protocol:    Patient identity confirmed:  Verbally with patient Location:    Type:  Cyst   Size:  3 cm x 1.5   Location:  Lower extremity   Lower extremity location:  Foot   Foot location:  L foot Procedure type:    Complexity:  Simple Procedure details:    Incision types:  Stab incision   Drainage:  Serous   Drainage amount:  Copious   Packing materials:  None Post-procedure details:    Procedure completion:  Tolerated  (including critical care time)  Medications Ordered in UC Medications - No data to display  Initial Impression / Assessment and Plan / UC Course  I have reviewed the triage vital signs and the nursing notes.  Pertinent labs & imaging results that were available during my care of the patient were reviewed by me and considered in my medical decision making (see chart for details).      Pt is a 83 y.o.  female with 5 days of left foot pain after dropping a can on her foot. On exam, pt has tenderness and bruising with large blister concerning for fracture.   Obtained left foot plain films.   Personally interpreted by me were unremarkable for fracture or dislocation. Radiologist report reviewed and additionally notes prominent dorsal soft tissue swelling at the 5th toe.    Patient to gradually return to normal activities, as tolerated and continue ordinary activities within the limits permitted by pain. Tylenol  PRN. Blister/Cyst drained with sterile needle here.  Cover with doxycycline  to prevent infection.   Patient to follow up with orthopedic provider or return to urgent care, if symptoms do not improve with conservative treatment.  Return and ED precautions given. Understanding voiced. Discussed MDM, treatment plan and plan for follow-up with patient and her husband who agree with plan.   Final Clinical Impressions(s) / UC Diagnoses   Final diagnoses:  Left foot pain     Discharge Instructions      You did not have any fractures or dislocated bones on your xray.  You should see your results in MyChart.   Follow up with a podiatrist like Dr Ashley or the [facility].   Follow up with [facility] in [place].   Call [phone] [address] [place], [place] [address]         ED Prescriptions     Medication Sig Dispense Auth. Provider   doxycycline  (VIBRAMYCIN ) 100  MG capsule Take 1 capsule (100 mg total) by mouth 2 (two) times daily. 14 capsule Malu Pellegrini, DO      PDMP not reviewed this encounter.   Cooper Stamp, DO 10/26/23 1239

## 2023-10-28 ENCOUNTER — Ambulatory Visit
Admission: EM | Admit: 2023-10-28 | Discharge: 2023-10-28 | Disposition: A | Discharge status: Home / Self Care | Attending: Family Medicine | Admitting: Family Medicine

## 2023-10-28 ENCOUNTER — Other Ambulatory Visit: Payer: Self-pay

## 2023-10-28 DIAGNOSIS — S90425D Blister (nonthermal), left lesser toe(s), subsequent encounter: Secondary | ICD-10-CM | POA: Diagnosis not present

## 2023-10-28 NOTE — Discharge Instructions (Signed)
 Keep the wound covered for the next 72 hours.  Remove prior to bathing and re-wrap with ace bandage after bathing. Continue taking your antibiotics as previously prescribed.

## 2023-10-28 NOTE — ED Triage Notes (Signed)
 Pt states she dropped a can on her left foot a week ago, came here 2 days ago and had the blister drained. Pt states the blister has came back full of fluid and bigger now.

## 2023-10-31 NOTE — ED Provider Notes (Signed)
 [facility]    CSN: [id number] Arrival date & time: 10/28/23  1532      History   Chief Complaint Chief Complaint  Patient presents with   Blister    HPI Kelly Wood is a 83 y.o. female.   HPI  Kelly Wood presents for returns for follow up for right foot blister after dropping a can on her foot over a week ago. She didn't know what to do about it so she came back to the urgent care.     Past Medical History:  Diagnosis Date   Anemia    Aortic atherosclerosis (HCC)    B12 deficiency    Cancer (HCC)    basal cell   Cataract cortical, senile    COPD (chronic obstructive pulmonary disease) (HCC)    Coronary artery disease    Dyspnea    Emphysema of lung (HCC)    GERD (gastroesophageal reflux disease)    History of hiatal hernia    Hyperlipidemia    Hypertension    Hypothyroidism    LPRD (laryngopharyngeal reflux disease)    Osteoarthritis of left hip    Osteoporosis    Right hip pain    Shingles    Wears hearing aid in both ears     Patient Active Problem List   Diagnosis Date Noted   Atrial tachycardia (HCC) 04/22/2022   Hiatal hernia 04/22/2022   SVT (supraventricular tachycardia) (HCC) 04/21/2022   Esophageal perforation 04/15/2022   S/P repair of paraesophageal hernia 04/07/2021   Pain from implanted hardware 11/06/2020   Lumbar spondylosis 03/10/2020   HTN, goal below 140/80 08/09/2019   Trochanteric bursitis, right hip 06/27/2019   Cataract cortical, senile 05/11/2019   GERD (gastroesophageal reflux disease) 05/11/2019   Hyperlipidemia 05/11/2019   Shingles 05/11/2019   Thyroid disease 05/11/2019   Closed right hip fracture (HCC) 12/26/2018   Anemia due to vitamin B12 deficiency 11/06/2013    Past Surgical History:  Procedure Laterality Date   CATARACT EXTRACTION Right    CATARACT EXTRACTION W/PHACO Left 03/08/2023   Procedure: CATARACT EXTRACTION PHACO AND INTRAOCULAR LENS PLACEMENT (IOC) LEFT 8.92 00:48.3;  Surgeon: Jaye Fallow, MD;  Location: [facility];  Service: Ophthalmology;  Laterality: Left;   CHEST TUBE INSERTION Right 04/15/2022   Procedure: CHEST TUBE INSERTION;  Surgeon: Jordis Laneta FALCON, MD;  Location: [facility];  Service: General;  Laterality: Right;   COLONOSCOPY N/A 06/23/2016   Procedure: COLONOSCOPY;  Surgeon: Lamar ONEIDA Holmes, MD;  Location: [facility];  Service: Endoscopy;  Laterality: N/A;   COLONOSCOPY  2007   ESOPHAGOGASTRODUODENOSCOPY  03/03/2021   HARDWARE REMOVAL Right 11/06/2020   Procedure: EXCHANGE OF LAG SCREW, RIGHT HIP;  Surgeon: Edie Norleen PARAS, MD;  Location: [facility];  Service: Orthopedics;  Laterality: Right;   INSERTION OF MESH  04/07/2021   Procedure: INSERTION OF MESH;  Surgeon: Jordis Laneta FALCON, MD;  Location: [facility];  Service: General;;   INTRAMEDULLARY (IM) NAIL INTERTROCHANTERIC Right 12/26/2018   Procedure: INTRAMEDULLARY (IM) NAIL INTERTROCHANTRIC;  Surgeon: Edie Norleen PARAS, MD;  Location: [facility];  Service: Orthopedics;  Laterality: Right;   IR PERC PLEURAL DRAIN W/INDWELL CATH W/IMG GUIDE  04/19/2022   LAPAROSCOPIC NISSEN FUNDOPLICATION  04/07/2021   Procedure: LAPAROSCOPIC NISSEN FUNDOPLICATION;  Surgeon: Jordis Laneta FALCON, MD;  Location: [facility];  Service: General;;   TUBAL LIGATION  1970   XI ROBOTIC ASSISTED PARAESOPHAGEAL HERNIA REPAIR N/A 04/07/2021   Procedure: XI ROBOTIC ASSISTED PARAESOPHAGEAL HERNIA REPAIR with RNFA  to assist;  Surgeon: Jordis Laneta FALCON, MD;  Location: [facility];  Service: General;  Laterality: N/A;   XI ROBOTIC ASSISTED PARAESOPHAGEAL HERNIA REPAIR N/A 04/15/2022   Procedure: XI ROBOTIC ASSISTED PARAESOPHAGEAL HERNIA REPAIR, RNFA to assist;  Surgeon: Jordis Laneta FALCON, MD;  Location: [facility];  Service: General;  Laterality: N/A;   XI ROBOTIC ASSISTED PARAESOPHAGEAL HERNIA REPAIR N/A 04/15/2022   Procedure: XI ROBOTIC ASSISTED PARAESOPHAGEAL HERNIA REPAIR;  Surgeon: Jordis Laneta FALCON, MD;  Location: [facility];  Service: General;  Laterality: N/A;     OB History   No obstetric history on file.      Home Medications    Prior to Admission medications   Medication Sig Start Date End Date Taking? Authorizing Provider  acetaminophen  (TYLENOL ) 500 MG tablet Take 2 tablets (1,000 mg total) by mouth every 8 (eight) hours. 05/17/23 05/16/24  Tobie Priest, MD  ACIDOPHILUS LACTOBACILLUS PO Take 1 capsule by mouth in the morning.    [provider]  albuterol  (VENTOLIN  HFA) 108 (90 Base) MCG/ACT inhaler Inhale 2 puffs into the lungs every 6 (six) hours as needed for wheezing or shortness of breath.    [provider]  alendronate  (FOSAMAX ) 70 MG tablet Take 70 mg by mouth every Monday. Take with a full glass of water  on an empty stomach.    [provider]  diltiazem  (CARDIZEM ) 90 MG tablet Take 90 mg by mouth 2 (two) times daily.    [provider]  doxycycline  (VIBRAMYCIN ) 100 MG capsule Take 1 capsule (100 mg total) by mouth 2 (two) times daily. 10/26/23   Julea Hutto, DO  hydrochlorothiazide  (HYDRODIURIL ) 25 MG tablet Take 25 mg by mouth daily. 06/12/23   [provider]  HYDROcodone -acetaminophen  (NORCO/VICODIN) 5-325 MG tablet Take 1 tablet by mouth every 4 (four) hours as needed for moderate pain (pain score 4-6). 05/17/23   Tobie Priest, MD  ibuprofen  (ADVIL ) 200 MG tablet Take 200 mg by mouth every 8 (eight) hours as needed (pain.).    [provider]  levothyroxine  (SYNTHROID ) 125 MCG tablet Take 62.5-125 mcg by mouth as directed. Take 0.5 tablet (62.5 mcg) by mouth every other day, alternating with 1 tablet (125 mcg) by mouth every other day    [provider]  Magnesium  100 MG TABS Take 100 mg by mouth every other day. In the morning    [provider]  melatonin 5 MG TABS Take 5 mg by mouth at bedtime.    [provider]  Multiple Vitamin (MULTIVITAMIN) capsule Take 1 capsule by mouth in the morning.    [provider]  naphazoline-pheniramine  (VISINE) 0.025-0.3 % ophthalmic solution Place 1-2 drops into both eyes 4 (four) times daily as needed for eye irritation.    [provider]  omeprazole (PRILOSEC) 20 MG capsule Take 20 mg by mouth daily before breakfast. 07/28/22   [provider]  ondansetron  (ZOFRAN -ODT) 4 MG disintegrating tablet Take 1 tablet (4 mg total) by mouth every 8 (eight) hours as needed for nausea or vomiting. 05/17/23   Tobie Priest, MD  pravastatin  (PRAVACHOL ) 80 MG tablet Take 80 mg by mouth every evening.    [provider]  tamsulosin (FLOMAX) 0.4 MG CAPS capsule Take 0.4 mg by mouth at bedtime.    [provider]  TRELEGY ELLIPTA 100-62.5-25 MCG/ACT AEPB Inhale 1 puff into the lungs daily in the afternoon. 01/18/23   [provider]    Family History Family History  Problem Relation  Age of Onset   Breast cancer Neg Hx     Social History Social History   Tobacco Use   Smoking status: Former    Current packs/day: 0.00    Types: Cigarettes    Quit date: 10/24/2006    Years since quitting: 17.0   Smokeless tobacco: Never  Vaping Use   Vaping status: Never Used  Substance Use Topics   Alcohol use: Yes    Alcohol/week: 1.0 standard drink of alcohol    Types: 1 Glasses of wine per week    Comment: rarely   Drug use: Never     Allergies   Ativan  [lorazepam ], Azithromycin, Levofloxacin, and Sulfa antibiotics   Review of Systems Review of Systems :negative unless otherwise stated in HPI.      Physical Exam Triage Vital Signs ED Triage Vitals  Encounter Vitals Group     BP 10/28/23 1549 135/66     Girls Systolic BP Percentile --      Girls Diastolic BP Percentile --      Boys Systolic BP Percentile --      Boys Diastolic BP Percentile --      Pulse Rate 10/28/23 1549 61     Resp --      Temp 10/28/23 1549 98.1 F (36.7 C)     Temp Source 10/28/23 1549 Oral     SpO2 10/28/23 1549 98 %     Weight --      Height --      Head Circumference --       Peak Flow --      Pain Score 10/28/23 1552 3     Pain Loc --      Pain Education --      Exclude from Growth Chart --    No data found.  Updated Vital Signs BP 135/66 (BP Location: Left Arm)   Pulse 61   Temp 98.1 F (36.7 C) (Oral)   SpO2 98%   Visual Acuity Right Eye Distance:   Left Eye Distance:   Bilateral Distance:    Right Eye Near:   Left Eye Near:    Bilateral Near:     Physical Exam GEN: well appearing female in no acute distress  CVS: well perfused  RESP: speaking in full sentences without pause, no respiratory distress  MSK:  Ankle/Foot, Left: 3 cm blister at the head of the 5th MT.  TTP noted at the 5th metatarsal. + visible erythema, ecchymosis at the 3rd-5th proximal toes but no bony deformity. Range of motion is full in all directions. Strength is 5/5 in all directions. No plantar calcaneal tenderness; No pain at base of 5th MT; Able to walk 4 steps.   UC Treatments / Results  Labs (all labs ordered are listed, but only abnormal results are displayed) Labs Reviewed - No data to display  EKG   Radiology No results found.  Procedures Procedures (including critical care time)  Medications Ordered in UC Medications - No data to display  Initial Impression / Assessment and Plan / UC Course  I have reviewed the triage vital signs and the nursing notes.  Pertinent labs & imaging results that were available during my care of the patient were reviewed by me and considered in my medical decision making (see chart for details).     Pt is a 83 y.o.  female with new blister on left foot after dropping a can on her foot over  week ago. Left foot plain films were  negative at [facility] visit 2 days ago. Blister opened with sterile 18-gauge needle.    Patient to gradually return to normal activities, as tolerated and continue ordinary activities within the limits permitted by pain. Tylenol  PRN.  Continue doxycycline  to prevent infection.    Patient to follow  up with orthopedic provider or return to urgent care, if symptoms do not improve with conservative treatment.  Return and ED precautions given. Understanding voiced. Discussed MDM, treatment plan and plan for follow-up with patient and her husband who agree with plan.   Final Clinical Impressions(s) / UC Diagnoses   Final diagnoses:  Blister of fifth toe of left foot, subsequent encounter     Discharge Instructions      Keep the wound covered for the next 72 hours.  Remove prior to bathing and re-wrap with ace bandage after bathing. Continue taking your antibiotics as previously prescribed.      ED Prescriptions   None    PDMP not reviewed this encounter.              Santo Zahradnik, DO 10/31/23 [phone]

## 2023-11-22 ENCOUNTER — Ambulatory Visit
Admission: RE | Admit: 2023-11-22 | Discharge: 2023-11-22 | Disposition: A | Discharge status: Home / Self Care | Source: Ambulatory Visit | Attending: Internal Medicine | Admitting: Internal Medicine

## 2023-11-22 DIAGNOSIS — Z1231 Encounter for screening mammogram for malignant neoplasm of breast: Secondary | ICD-10-CM | POA: Insufficient documentation

## 2023-12-14 ENCOUNTER — Other Ambulatory Visit: Payer: Self-pay | Admitting: Orthopedic Surgery

## 2023-12-14 DIAGNOSIS — S76019A Strain of muscle, fascia and tendon of unspecified hip, initial encounter: Secondary | ICD-10-CM

## 2024-01-04 ENCOUNTER — Ambulatory Visit
Admission: RE | Admit: 2024-01-04 | Discharge: 2024-01-04 | Disposition: A | Discharge status: Home / Self Care | Source: Ambulatory Visit | Attending: Orthopedic Surgery | Admitting: Orthopedic Surgery

## 2024-01-04 DIAGNOSIS — S76019A Strain of muscle, fascia and tendon of unspecified hip, initial encounter: Secondary | ICD-10-CM | POA: Insufficient documentation

## 2024-01-09 NOTE — Progress Notes (Signed)
 ORTHOPEDIC SURGERY - HIP EVALUATION  Chief Complaint: Chief Complaint  Patient presents with  . Hip Pain    Right hip MRI results   05/17/23: PROCEDURE:  1. Right hip endoscopic gluteus medius and minimus repair (partial-thickness gluteus medius/minimus tear)  2. Right hip endoscopic trochanteric bursectomy 3. Right hip endoscopic iliotibial band release    History of Present Illness: 01/09/24: At last visit with me on 11/03/2023, she was given a corticosteroid injection to the right trochanteric bursa.  She did note almost complete relief of her symptoms, but only for 2 days.  She then underwent another course of physical therapy with last visit on 12/06/2023.  She states that this did not really help her overall symptoms.  Pain continues to be located over the lateral aspect of her hip.  Her symptoms are tolerable during the day, but significantly worsened at night.  Pain at night wakes her up from her sleep and she has to walk around to help that ease up.  She is using a CBD oil about the lateral aspect of the hip, and this does help her symptoms somewhat.  11/03/2023: Now almost 6 months after above procedures.  She had been doing quite well with only mild lateral sided hip pain at night after a corticosteroid injection at her last visit until approximately 2 weeks ago when she injured her foot and has altered her gait to avoid pressure on her lateral foot.  Since that time, she has had increased lateral sided hip pain, especially at night.  This is more prominent on days where she has seated for prolonged period of time and is improved on days she has physical therapy stretches.   08/25/2023: Now over 3 months after above surgeries.  She is continued with physical therapy and continues to do relatively well.  She can sleep through the night and perform all daily activities without limitation.  She has some occasional soreness about the lateral aspect of the hip that is worsened with prolonged  sitting.  Overall, she is pleased with the results of her surgery, but she would like improvement in her current lateral sided symptoms.  06/27/2023: Now 6 weeks after going above procedures.  Overall, she is quite pleased with the result of her surgery. She is not having any significant pain at baseline currently. She did note that there was a day where she was more active and then started started physical therapy exercises the next day, and she had some increased soreness, but her symptoms quickly resolved 1-2 days afterwards.  She is ambulating with a cane for longer distances, but feels comfortable ambulating unassisted.   06/02/23: She is now 2 weeks after undergoing above procedures.  She was seen 2 days after surgery due to reddish drainage from around the bandages.  This was replaced and redressed.  Drainage stopped shortly afterwards.  She reports that her preoperative symptoms are have resolved.  She is able to sleep at night without having to wake up.  Her current level of pain is improved with Tylenol  and icing.  She is maintaining weightbearing precautions.  04/21/23: Jamie-Lee Galdamez is a 83 y.o. female  referred by Tobie for R hip evaluation and management.  She presents with her husband today.  Prior medical records were reviewed.  She initially underwent IM nailing of right hip on 12/26/2018 by Dr. Edie.  The fracture healed, but she had significant pain about the lateral aspect of the hip and underwent exchange of lag screw on the  right hip to a shorter screw.  This was performed in August 2022.  Since that time, she has undergone multiple right greater trochanteric bursa corticosteroid injections, each offering significant relief, but only for a limited duration.  She currently rates pain severity as a 4/10. Her symptoms began after her initial hip surgery in 2020.  She had minimal improvement in her symptoms after undergoing screw exchange.  Pain continues to be located over the lateral  aspect of the hip over the prominence of the greater trochanter.  She states that she has difficulty ambulating longer distances.  She also has significant pain at night when she attempts to sleep.  She wakes up at least a few times every night, and she feels more tired throughout the day.  She has had multiple trochanteric bursa corticosteroid injections.  These had helped her symptoms significantly, but last few injections only lasted for 1-2 nights before symptoms returned.  She is frustrated with her quality of life due to the symptoms.  She is retired.  She enjoys walking for recreation.  She has never smoked.  Recent lab work from 04/19/2023 was reviewed.  Hemoglobin A1c was 5.7. TSH was normal.  CMP was essentially normal.  CBC was most unconcerning with a slight elevation in white count of 11.4.  PMHx, PSurgHx, Fam Hx, Soc Hx, Meds, Allergies: Past Medical History:  Diagnosis Date  . Cataract cortical, senile   . GERD (gastroesophageal reflux disease)   . Hyperlipidemia   . Shingles   . Thyroid disease     Past Surgical History:  Procedure Laterality Date  . COLONOSCOPY  03/31/2006   FH Colon Polyps (Mother): [facility] 03/2011  . COLONOSCOPY  06/23/2016   FH Colon Polyps (Mother): [facility] 06/2021  . Reduction and internal fixation of Right hip fracture with biomet Affixis TFn nail  Right 12/26/2018   Dr.Poggi   . Lag screw exchange, right hip Right 11/06/2020   Dr.Poggi  . EGD  03/03/2021   GERD/RptPRN/TKT  . OTHER SURGERY  04/15/2022   PARAESOPHAGEAL HERNIA REPAIR  . CATARACT EXTRACTION Right   . EPIGASTRIC HERNIA REPAIR N/A    Patient reported hernia was originally in chest  . Pneumothoraces x 2    . TUBAL LIGATION Bilateral     Family History  Problem Relation Age of Onset  . Myocardial Infarction (Heart attack) Father   . Hyperlipidemia (Elevated cholesterol) Brother   . Colon polyps Mother   . Colon cancer Neg Hx     Social History   Socioeconomic History  . Marital  status: Married    Spouse name: Rutha  . Number of children: 2  . Years of education: 13  Occupational History  . Occupation: Retired Administrator, arts  . Smoking status: Former    Current packs/day: 0.00    Average packs/day: 1 pack/day for 43.0 years (43.0 ttl pk-yrs)    Types: Cigarettes    Start date: 81    Quit date: 2006    Years since quitting: 19.7  . Smokeless tobacco: Never  Vaping Use  . Vaping status: Never Used  Substance and Sexual Activity  . Alcohol use: Yes    Comment: Wine Socially maybe once a month  . Drug use: Never  . Sexual activity: Defer    Partners: Male   Social Drivers of Health   Financial Resource Strain: Low Risk  (11/14/2023)   Overall Financial Resource Strain (CARDIA)   . Difficulty of Paying Living Expenses: Not hard  at all  Food Insecurity: No Food Insecurity (11/14/2023)   Hunger Vital Sign   . Worried About Programme researcher, broadcasting/film/video in the Last Year: Never true   . Ran Out of Food in the Last Year: Never true  Transportation Needs: No Transportation Needs (11/14/2023)   PRAPARE - Transportation   . Lack of Transportation (Medical): No   . Lack of Transportation (Non-Medical): No  Housing Stability: Low Risk  (11/14/2023)   Housing Stability Vital Sign   . Unable to Pay for Housing in the Last Year: No   . Number of Times Moved in the Last Year: 0   . Homeless in the Last Year: No     Current Outpatient Medications  Medication Sig Dispense Refill  . albuterol  MDI, PROVENTIL , VENTOLIN , PROAIR , HFA 90 mcg/actuation inhaler Inhale 2 inhalations into the lungs every 6 (six) hours as needed for Wheezing 1 each 1  . aspirin  81 MG EC tablet Take 81 mg by mouth once daily    . cefUROXime  (CEFTIN ) 250 MG tablet Take 1 tablet (250 mg total) by mouth 2 (two) times daily for 10 days 20 tablet 0  . dilTIAZem  (CARDIZEM ) 90 MG immediate release tablet TAKE 1 TABLET BY MOUTH 2 TIMES DAILY. 200 tablet 3  . fluticasone  propion-salmeteroL (WIXELA  INHUB) 250-50 mcg/dose diskus inhaler Inhale 1 Puff into the lungs every 12 (twelve) hours 60 each 5  . ibuprofen  (MOTRIN ) 200 MG tablet Take 200 mg by mouth every 6 (six) hours as needed for Pain    . Lactobacillus acidophilus (PROBIOTIC ORAL) Take by mouth once daily    . levothyroxine  (SYNTHROID ) 125 MCG tablet TAKE 1 TABLET (125 MCG TOTAL) BY MOUTH ONCE DAILY TAKE ON AN EMPTY STOMACH WITH A GLASS OF WATER  AT LEAST 30-60 MINUTES BEFORE BREAKFAST. (Patient taking differently: Take 125 mcg by mouth once daily Pt taking 125mcg one day and alternating with a half) 90 tablet 3  . MELATONIN ORAL Take by mouth    . multivitamin with minerals Cap Take 1 capsule by mouth once daily    . omeprazole (PRILOSEC) 20 MG DR capsule TAKE 1 CAPSULE BY MOUTH TWICE A DAY BEFORE MEALS (Patient taking differently: Take 20 mg by mouth once daily) 180 capsule 1  . pravastatin  (PRAVACHOL ) 80 MG tablet TAKE 1 TABLET (80 MG TOTAL) BY MOUTH ONCE DAILY. 90 tablet 1  . tamsulosin (FLOMAX) 0.4 mg capsule TAKE 1 CAPSULE (0.4 MG TOTAL) BY MOUTH ONCE DAILY, 30 MINUTES AFTER SAME MEAL EACH DAY. 90 capsule 3   Current Facility-Administered Medications  Medication Dose Route Frequency Provider Last Rate Last Admin  . cyanocobalamin (VITAMIN B12) injection 1,000 mcg  1,000 mcg Intramuscular Q30 Days Auston Reyes BIRCH, MD   1,000 mcg at 12/15/23 1614    Allergies  Allergen Reactions  . Lorazepam  Anxiety    Paranoia  . Azithromycin Diarrhea  . Levaquin [Levofloxacin] Other (See Comments)    Pt prefers not to take because og side effects  . Sulfa (Sulfonamide Antibiotics) Rash    Review of Systems: A 10+ ROS was performed, reviewed, and the pertinent orthopaedic findings are documented in the HPI.  I have reviewed and agree with the ROS captured by the CMA.    Physical Exam: There were no vitals filed for this visit.  General/Constitutional: NAD, conversant Eyes: Pupils equal and round, extraocular movements intact ENT:  atraumatic external nose and ears, moist mucous membranes Respiratory: non-labored breathing, symmetric chest rise Cardiovascular: no visible lower extremity  edema, peripheral pulses present  Skin: normal skin turgor, warm and dry Neurological: cranial nerves grossly intact, sensation grossly intact Psychological:  Appropriate mood and affect; appropriate judgment Musculoskeletal: as detailed below:  Comprehensive Hip Exam:  Gait   Leg Lengths Equal   RIGHT  LEFT  Inspection Well-healed surgical incisions from prior hip fracture.  Endoscopic incisions are well healed No atrophy, skin lesions or scars   Palpation    Tenderness moderate over greater trochanter No location  Lateral Compression of the pelvis does not reproduce pain complaints.  Range of Motion    Qualitative Normal flexion Normal flexion  Pain with ROM at mid-flexion Evidence of hip irritability consistent with synovitis  Is not present   Quantitative    Flexion 95 95  Extension    Abduction    Adduction    ER at 90 of flexion 40 40  IR at 90 of flexion 15 15  Strength    Flexion    Adduction    Abduction 5/5   Pain Provocation Tests    Impingement (FADIR) Positive, does partially reproduce patient's symptoms   Sub-spine Impingement    Psoas Impingement/Anterior Capsule    Instability/log roll    Posterior Impingement    Lateral Rim Impingement    Trochanteric Pain Sign    Ischio-femoral Impingement Sign    Seated piriformis stretch (90 deg hip flexion, knee extension --> leg adducted and IR passively)    Piriformis active test (lateral decubitus, heel on table --> active abd + ER against resistance)    Straight leg raise (passive)    Straight leg raise (active)    Circumduction clunk    Peritrochanteric Space Exam    Pain over trochanter Positive   Snapping with the bicycle maneuver    Ober's Test    Weakness in abduction Negative   Posterior Chain    Recruitment Moderate   Neurovascular    Distal  Pulse normal normal  Distal Motor Normal Normal  Distal Sensation Normal Normal    Lumbar Spine   Patient has symmetric lumbar range of motion.    Imaging:  R Hip radiographs:  04/21/2023: 3 views of the right hip (AP, Dunn lateral, and false profile) were obtained. There are no fractures or dislocations.  There are mild degenerative changes to the hip joints bilaterally with slight joint space narrowing and central osteophytosis.  Right hip has undergone long cephalomedullary nailing.  No significant lateral sided hardware prominence.  Intertrochanteric fracture appears to have healed.  R Hip MRI: 04/26/23: IMPRESSION:  1. Status post right femoral long cephalomedullary nail fixation of  a remote proximal right femoral intertrochanteric fracture. Note is  made of revision of this hardware 2 a shorter lag screw on  11/06/2020.  2. Edema without frank fluid within the right trochanteric bursa,  new from 08/12/2020.  3. Moderate bilateral femoroacetabular osteoarthritis. Moderate  subchondral cystic change within the bilateral superomedial  acetabula, new from 08/12/2020 MRI.  4. Minimal bilateral proximal hamstring tendinosis, decreased from  08/12/2020 MRI.   01/04/24: FINDINGS:  Soft tissue and Muscle:   There is no obvious soft tissue swelling.Postoperative changes  related to interval right gluteus medius and minimus cuff repair,  trochanteric bursectomy, and IT band release. No appreciable acute  tear is identified, although evaluation is limited by surrounding  susceptibility artifact. Hamstring tendon origins are intact. No  bursitis.   Bones:   Status post ORIF of a remote proximal right femoral  intertrochanteric fracture with associated  susceptibility artifact.  Similar healed fracture line sclerosis at the level of the lesser  trochanter. No acute osseous abnormality. No evidence of avascular  necrosis. Moderate degenerative arthropathy of the pubic symphysis.    Hip:   The hip joint is anatomically aligned. Moderate superolateral joint  space narrowing with osteophytosis and subchondral cystic change in  the superomedial acetabulum.Labrum is grossly intact, although  evaluation is limited by lack of intra-articular contrast and  susceptibility artifact.Diffuse thinning of the superior  weight-bearing articular cartilage. No joint effusion.   Large field-of-view coronal images demonstrate similar moderate  osteoarthritis of the left hip.   Other:   Sigmoid colonic diverticulosis.   IMPRESSION:  1. Postoperative changes related to interval right gluteus medius  and minimus cuff repair, trochanteric bursectomy, and IT band  release. No appreciable acute tear is identified, although  evaluation is limited by surrounding susceptibility artifact.  2. Status post ORIF of a remote proximal right femoral  intertrochanteric fracture.  3. Moderate osteoarthritis of the bilateral hips.   I personally reviewed and visualized the aforementioned imaging studies. I additionally personally interpreted any radiographs taken during today's visit.   Assessment & Plan: There are no diagnoses linked to this encounter.     Kaylie Ritter is a 83 y.o. 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 Edie.  She is now s/p right endoscopic gluteus medius and minimus repair of partial-thickness tear, trochanteric bursectomy, and IT band release on 05/17/2023.  She has been doing well postoperatively until she developed recurrent pain that has not fully responded to trochanteric bursa corticosteroid injections. 1.  We discussed her pain symptoms as well as the various further diagnostic and therapeutic treatment options.  Given her current physical exam, there may be some pain referred from the hip joint.  I recommend that she  undergo an ultrasound-guided corticosteroid injection to the right hip joint by Dr. Sharrie.  Patient was in agreement with this plan.  2.  Patient will plan to message us  a few days after the injection in regards to her response to this.  If she did get excellent, complete relief of her symptoms, we could conclude that she did have an element of pain from underlying arthritis.  If she gets no response from this injection, we can conclude that pain is all from the peritrochanteric space.  3.  Further follow-up/plan is pending patient's response to left hip joint injection.   Over 30 minutes were spent on this patient visit including reviewing prior records, imaging studies including MRI and/or radiographs and associated reports, coordination of future care, documentation of today's visit, and discussion with and counseling the patient in regards to the clinical findings, diagnosis, and treatment options.

## 2024-01-17 NOTE — Progress Notes (Signed)
 [facility] ULTRASOUND GUIDED PROCEDURE VISIT  The patient presents for ultrasound-guided right intra-articular hip joint steroid injection at the referral of Earnestine Blanch, MD.  She was last evaluated by Dr. Blanch on 01/09/2024.  At that time, she was following up ongoing right hip pain.  She is status post endoscopic gluteus medius and minimus repair with trochanteric bursectomy and IT band release by Dr. Blanch on 05/17/2023.  She has previously had proximal femur fracture with ORIF as well.  At her recent visit, she was following up MRI results from 01/04/2024 that showed some moderate hip arthritis as well as expected postoperative changes around the lateral hip.  She was reporting lateral hip pain that only got 2 days of improvement from landmark guided trochanteric bursa steroid injection on 11/03/2023.  She also has been using topical pain cream, physical therapy, activity modification, ibuprofen  with persistent symptoms.  On evaluation, she had tenderness palpation over her lateral hip still but some pain also with hip range of motion.  Based on her history, physical, imaging, there was suspicion for intra-articular hip joint causing her pain so she was recommended diagnostic/therapeutic intra-articular hip joint steroid injection.  Today, she rates her pain severity as 7/10.  RIGHT ultrasound guided intra-articular hip joint Injection   Consent After discussing the various treatment options for the condition,  It was agreed that a corticosteroid injection would be the next step in treatment.  The nature of and the indications for a corticosteroid and / or local anaesthetic injection were reviewed in detail with the patient today.  The inherent risks of injection including infection, allergic reaction, increased pain, incomplete relief or temporary relief of symptoms, alterations of blood glucose levels requiring careful monitoring and treatment as indicated,  tendon, ligament or articular cartilage rupture or degeneration, nerve injury, skin depigmentation, and/or fatty atrophy were discussed.    Indication for ultrasound Ultrasound-guided needle placement was indicated during this procedure due to the deep location of the target structure and its proximity to neurovascular structures.  Diagnostic injection where accurate injectate placement is critical for diagnosis.   Procedure After the risks and benefits of the procedure were explained, consent was given, and time-out was performed.  The right hip joint and surrounding structures were visualized with ultrasound.  There was mild synovial hypertrophy without effusion.  The site for the injection was properly marked and prepped with Chlorhexadine/Isopropyl alcohol solution.      The injection site was anesthetized with ethyl chloride and 3cc of 1% Lidocaine  using a 22 gauge 3.5 inch needle. Using ultrasound guidance, the hip joint was visualized and injected with 6 milligrams of Betamethasone, 2 milliliters of 0.25% Bupivacaine , and 2 milliliters of 1% Lidocaine  using a sterile technique and a 22 gauge 3.5 inch needle. During injection, there was unrestricted flow and care was taken not to inject corticosteroid into the skin or subcutaneous tissues.    A sterile band-aide was applied.  Post-injection instructions were given regarding post-procedure care, when to follow up in clinic and what to expect from the procedure.  The patient tolerated the injection well and was discharged without complication.     Contact our office with any questions or concerns.  Follow up as indicated, or sooner should any new problems arise, if conditions worsen, or if they are otherwise concerned.     Prentice Reges, DO [facility] [address] [place], [place] [address] Phone: [phone]   This note was  generated in part with voice recognition software and I apologize for  any typographical errors that were not detected and corrected.  Large Joint Injection: R hip joint  Date/Time: 01/17/2024 11:00 AM  Performed by: Sharrie Barter, DO Authorized by: Sharrie Barter, DO   Procedure discussed: discussed risks, benefits, and alternatives   Consent Given by:  Patient Timeout: timeout called immediately prior to procedure   Prep: patient was prepped and draped in usual sterile fashion   Needle Size:  22 G Guidance: ultrasound   Location:  Hip Site:  R hip joint Topical skin anesthesia: obtained using ethyl chloride spray   Medications:  3 mL lidocaine  1 %; 6 mg betamethasone acetate-betamethasone sodium phosphate  6 mg/mL; 2 mL lidocaine  1 %; 2 mL BUPivacaine  HCl 0.25 % Outcome:  Tolerated well, no immediate complications Patient tolerance:  Patient tolerated the procedure well Instructions: patient was counseled as to the expected post injection course, including the possibility of temporary worsening of symptoms   patient was instructed as to concerning symptoms or signs and instructed to contact the office if these should appear

## 2024-01-21 ENCOUNTER — Emergency Department

## 2024-01-21 ENCOUNTER — Other Ambulatory Visit: Payer: Self-pay

## 2024-01-21 ENCOUNTER — Emergency Department: Admission: EM | Admit: 2024-01-21 | Discharge: 2024-01-22 | Disposition: A | Discharge status: Home / Self Care

## 2024-01-21 DIAGNOSIS — K420 Umbilical hernia with obstruction, without gangrene: Secondary | ICD-10-CM | POA: Diagnosis not present

## 2024-01-21 DIAGNOSIS — K4030 Unilateral inguinal hernia, with obstruction, without gangrene, not specified as recurrent: Secondary | ICD-10-CM | POA: Diagnosis not present

## 2024-01-21 DIAGNOSIS — J449 Chronic obstructive pulmonary disease, unspecified: Secondary | ICD-10-CM | POA: Diagnosis not present

## 2024-01-21 DIAGNOSIS — I10 Essential (primary) hypertension: Secondary | ICD-10-CM | POA: Diagnosis not present

## 2024-01-21 DIAGNOSIS — R109 Unspecified abdominal pain: Secondary | ICD-10-CM | POA: Diagnosis present

## 2024-01-21 LAB — CBC WITH DIFFERENTIAL/PLATELET
Abs Immature Granulocytes: 0.04 K/uL (ref 0.00–0.07)
Basophils Absolute: 0 K/uL (ref 0.0–0.1)
Basophils Relative: 0 %
Eosinophils Absolute: 0.2 K/uL (ref 0.0–0.5)
Eosinophils Relative: 2 %
HCT: 37.5 % (ref 36.0–46.0)
Hemoglobin: 12 g/dL (ref 12.0–15.0)
Immature Granulocytes: 0 %
Lymphocytes Relative: 24 %
Lymphs Abs: 2.8 K/uL (ref 0.7–4.0)
MCH: 28.6 pg (ref 26.0–34.0)
MCHC: 32 g/dL (ref 30.0–36.0)
MCV: 89.3 fL (ref 80.0–100.0)
Monocytes Absolute: 0.9 K/uL (ref 0.1–1.0)
Monocytes Relative: 8 %
Neutro Abs: 7.7 K/uL (ref 1.7–7.7)
Neutrophils Relative %: 66 %
Platelets: 198 K/uL (ref 150–400)
RBC: 4.2 MIL/uL (ref 3.87–5.11)
RDW: 13.8 % (ref 11.5–15.5)
WBC: 11.7 K/uL — ABNORMAL HIGH (ref 4.0–10.5)
nRBC: 0 % (ref 0.0–0.2)

## 2024-01-21 LAB — COMPREHENSIVE METABOLIC PANEL WITH GFR
ALT: 22 U/L (ref 0–44)
AST: 25 U/L (ref 15–41)
Albumin: 3.7 g/dL (ref 3.5–5.0)
Alkaline Phosphatase: 56 U/L (ref 38–126)
Anion gap: 11 (ref 5–15)
BUN: 30 mg/dL — ABNORMAL HIGH (ref 8–23)
CO2: 25 mmol/L (ref 22–32)
Calcium: 9 mg/dL (ref 8.9–10.3)
Chloride: 104 mmol/L (ref 98–111)
Creatinine, Ser: 0.62 mg/dL (ref 0.44–1.00)
GFR, Estimated: >60 mL/min (ref >60)
Glucose, Bld: 141 mg/dL — ABNORMAL HIGH (ref 70–99)
Potassium: 3.5 mmol/L (ref 3.5–5.1)
Sodium: 140 mmol/L (ref 135–145)
Total Bilirubin: 0.2 mg/dL (ref 0.0–1.2)
Total Protein: 6.8 g/dL (ref 6.5–8.1)

## 2024-01-21 MED ORDER — ONDANSETRON 4 MG PO TBDP
4.0000 mg | ORAL_TABLET | Freq: Once | ORAL | Status: AC
  Administered 2024-01-21: 4 mg via ORAL
  Filled 2024-01-21: qty 1

## 2024-01-21 MED ORDER — FENTANYL CITRATE (PF) 50 MCG/ML IJ SOSY
50.0000 ug | PREFILLED_SYRINGE | Freq: Once | INTRAMUSCULAR | Status: AC
  Administered 2024-01-21: 50 ug via INTRAVENOUS
  Filled 2024-01-21: qty 1

## 2024-01-21 MED ORDER — MORPHINE SULFATE (PF) 4 MG/ML IV SOLN
4.0000 mg | Freq: Once | INTRAVENOUS | Status: AC
  Administered 2024-01-21: 4 mg via INTRAVENOUS
  Filled 2024-01-21: qty 1

## 2024-01-21 MED ADMIN — Iohexol Inj 300 MG/ML: 100 mL | INTRAVENOUS | NDC 00407141363

## 2024-01-21 NOTE — ED Provider Notes (Incomplete)
 [facility] Provider Note    Event Date/Time   First MD Initiated Contact with Patient 01/21/24 2310     (approximate)  History   Chief Complaint: Hernia  HPI  Kelly Wood is a 83 y.o. female with a past medical history of anemia, COPD, gastric reflux, hypertension, hyperlipidemia, presents to the emergency department for abdominal pain.  According to the patient and husband around 6 PM tonight they noticed a bulge in the patient's abdomen that she was having a lot of abdominal pain and nausea.  Patient states continued nausea but no vomiting.  Does have an umbilical bulge fairly large.  States last bowel movement was this morning but smaller than normal.  No urinary symptoms.  No fever.  No history of prior umbilical hernia patient does state a history of a hiatal hernia previously and states she has had a prior abdominal surgery but does not recall what type.   Physical Exam   Triage Vital Signs: ED Triage Vitals  Encounter Vitals Group     BP 01/21/24 2229 (!) 174/95     Girls Systolic BP Percentile --      Girls Diastolic BP Percentile --      Boys Systolic BP Percentile --      Boys Diastolic BP Percentile --      Pulse Rate 01/21/24 2229 82     Resp 01/21/24 2229 (!) 22     Temp 01/21/24 2232 97.9 F (36.6 C)     Temp Source 01/21/24 2232 Oral     SpO2 01/21/24 2229 93 %     Weight 01/21/24 2225 123 lb (55.8 kg)     Height 01/21/24 2225 5' (1.524 m)     Head Circumference --      Peak Flow --      Pain Score 01/21/24 2224 10     Pain Loc --      Pain Education --      Exclude from Growth Chart --     Most recent vital signs: Vitals:   01/21/24 2229 01/21/24 2232  BP: (!) 174/95   Pulse: 82   Resp: (!) 22   Temp:  97.9 F (36.6 C)  SpO2: 93%     General: Awake, no distress.  CV:  Good peripheral perfusion.  Regular rate and rhythm  Resp:  Normal effort.  Equal breath sounds bilaterally.  Abd:  Mild distention.  There is  moderate bulge of the umbilical area with tympanic percussion consistent with likely umbilical hernia with loops of bowel.  Abdomen is mildly tender throughout and appears distended even in the area is not directly related to the umbilical hernia  ED Results / Procedures / Treatments   EKG  EKG viewed and interpreted by myself shows a normal sinus rhythm 83 bpm the narrow QRS, normal axis, normal intervals, no concerning ST changes.  RADIOLOGY  I have reviewed interpreted the CT images.  Patient appears to have a bowel obstruction likely secondary to this large umbilical hernia.  Hernia appears to contain bowel loops.  The defect in the muscular wall appears fairly large we will attempt manual reduction at the bedside.   MEDICATIONS ORDERED IN ED: Medications  fentaNYL  (SUBLIMAZE ) injection 50 mcg (has no administration in time range)  ondansetron  (ZOFRAN -ODT) disintegrating tablet 4 mg (4 mg Oral Given 01/21/24 2249)  morphine  (PF) 4 MG/ML injection 4 mg (4 mg Intravenous Given 01/21/24 2259)  iohexol  (OMNIPAQUE ) 300 MG/ML solution 100 mL (  100 mLs Intravenous Contrast Given 01/21/24 2336)     IMPRESSION / MDM / ASSESSMENT AND PLAN / ED COURSE  I reviewed the triage vital signs and the nursing notes.  Patient's presentation is most consistent with acute presentation with potential threat to life or bodily function.  Patient presents to the emergency department for abdominal distention discomfort nausea and a large umbilical bulge.  Clinically appears consistent with a large umbilical hernia.  However the remainder of the abdomen is also distended concerning for small bowel obstruction.  We will obtain CT imaging.  Will treat pain.  Will continue to closely monitor. CT scan reviewed by myself appears show a large umbilical hernia containing bowel loops likely causing bowel obstruction.  Will attempt manual reduction at bedside.  FINAL CLINICAL IMPRESSION(S) / ED DIAGNOSES   Abdominal  pain Bowel obstruction Umbilical hernia  Rx / DC Orders   ***  Note:  This document was prepared using Dragon voice recognition software and may include unintentional dictation errors.

## 2024-01-21 NOTE — ED Triage Notes (Signed)
 Pt arrives from home via [facility].  C/O hernia that spontaneously popped out mid stomach at rest approx 2-3 hours ago.  Pt with pain and nausea with EMS, Zofran  4mg  and Fentanyl  75mcg given. HX 'sliding hiatal hernia.' Pt denies nausea at this time, states pain of 10.  LBM today.

## 2024-01-21 NOTE — ED Notes (Signed)
 Husband at bedside.

## 2024-01-22 ENCOUNTER — Encounter
Admission: EM | Disposition: A | Discharge status: Home / Self Care | Payer: Self-pay | Source: Home / Self Care | Attending: Emergency Medicine

## 2024-01-22 DIAGNOSIS — K420 Umbilical hernia with obstruction, without gangrene: Secondary | ICD-10-CM

## 2024-01-22 DIAGNOSIS — K4030 Unilateral inguinal hernia, with obstruction, without gangrene, not specified as recurrent: Secondary | ICD-10-CM

## 2024-01-22 LAB — TYPE AND SCREEN
ABO/RH(D): A POS
Antibody Screen: NEGATIVE

## 2024-01-22 LAB — LACTIC ACID, PLASMA: Lactic Acid, Venous: 1.1 mmol/L (ref 0.5–1.9)

## 2024-01-22 SURGERY — EXAM UNDER ANESTHESIA, PEDIATRIC
Anesthesia: Choice

## 2024-01-22 NOTE — ED Notes (Signed)
 Assisted pt up to the bedside commode, steady on her feet and back to bed, adjusted her pulse ox and also gave her 2 new warm blankets and changed the linen/chux as it was bloody . No further needs at this time

## 2024-01-22 NOTE — ED Notes (Signed)
 Per [facility] in [place], pt will not be going to OR tonight/no procedure scheduled at this time.

## 2024-01-22 NOTE — Discharge Instructions (Addendum)
 Please call surgery to arrange a follow-up appointment to discuss further management of your hernia.  As we discussed should you develop any abdominal pain, abdominal distention, nausea/vomiting please return immediately to the emergency department.  Advised to pursue a goal of 25 to 30 g of fiber daily.  The majority of this may be through natural sources, advised to ensure a minimal daily fiber supplementation.  Various forms of supplements discussed.  Recommend Psyllium husk, with options of mixing with beverage or applesauce to make more tolerable. Strongly advised to consume more fluids(especially in proximity to fiber intake) and to ensure adequate hydration.   Watch color of urine to determine adequacy of hydration.  Clarity is pursued in urine output, and bowel activity that responds and corresponds to significant meal intake.   We need to avoid deferring having bowel movements, advised to take the time at the first sign of sensation, typically following meals, and in the morning.  The need to avoid more frequently, and the presence of flatus may indicate the need for bowel movement.  Do not defer for later.  Addition of MiraLAX (or its generic equivalent) may be needed ensure at least twice daily bowel movements.  If multiple doses of MiraLAX are necessary utilize them. Never skip a day... Do not tolerate a day without a bowel movement unless you are fasting.  To be regular, we must do the above EVERY day.   Soluble Fiber Dissolves in Water : Soluble fiber dissolves in water  to form a gel-like substance. Slows Digestion: This type of fiber slows down digestion, which can help control blood sugar levels and lower cholesterol. Sources: Common sources include oats, beans, apples, citrus fruits, and psyllium. Benefits: Helps manage cholesterol levels. Aids in blood sugar control. Increases healthy gut bacteria, which can lower inflammation and improve digestion.  Insoluble Fiber Does Not  Dissolve in Water : Insoluble fiber does not dissolve in water  and remains mostly intact as it passes through the digestive system. Adds Bulk to Stool: It adds bulk to stool, which helps promote regular bowel movements and prevent constipation. Sources: Common sources include whole grains, nuts, beans, and vegetables like cauliflower and potatoes. Benefits: Improves bowel health and regularity. Reduces the risk of colorectal conditions like hemorrhoids and diverticulitis. Supports insulin sensitivity in people with diabetes.  Both types of fiber are essential for overall health, and it's beneficial to include a 50/50 mix of both in your diet.

## 2024-01-22 NOTE — Consult Note (Signed)
 [facility] SURGICAL CONSULTATION NOTE (initial) - cpt: 99244 (Outpatient/ED)   HISTORY OF PRESENT ILLNESS (HPI):  83 y.o. female presented to [facility] ED today for evaluation of abdominal pain. Patient reports having some constipation and bleeding with some very small round stool balls, requiring significant effort.  She reports coming out of the shower and noting significant distention of her known umbilical hernia with significantly increased pain.  She became nauseated, but did not vomit.  Surgery is consulted by ED physician Dr. Dorothyann in this context for evaluation and management of an incarcerated left inguinal hernia resulting in extensive large and small bowel obstruction exacerbating her widemouth umbilical hernia presentation.  Apparently the ED physician had applied some significant manipulation to the left groin and along with some pain medication achieved some slight result which has been more significant than he had appreciated.  Unaware of the reduced nature, I was consulted to evaluate the patient for urgent/emergent hernia repairs in light of large bowel obstruction.   PAST MEDICAL HISTORY (PMH):  Past Medical History:  Diagnosis Date   Anemia    Aortic atherosclerosis    B12 deficiency    Cancer (HCC)    basal cell   Cataract cortical, senile    COPD (chronic obstructive pulmonary disease) (HCC)    Coronary artery disease    Dyspnea    Emphysema of lung (HCC)    GERD (gastroesophageal reflux disease)    History of hiatal hernia    Hyperlipidemia    Hypertension    Hypothyroidism    LPRD (laryngopharyngeal reflux disease)    Osteoarthritis of left hip    Osteoporosis    Right hip pain    Shingles    Wears hearing aid in both ears      PAST SURGICAL HISTORY (PSH):  Past Surgical History:  Procedure Laterality Date   CATARACT EXTRACTION Right    CATARACT EXTRACTION W/PHACO Left 03/08/2023   Procedure: CATARACT EXTRACTION PHACO AND INTRAOCULAR  LENS PLACEMENT (IOC) LEFT 8.92 00:48.3;  Surgeon: Jaye Fallow, MD;  Location: [facility];  Service: Ophthalmology;  Laterality: Left;   CHEST TUBE INSERTION Right 04/15/2022   Procedure: CHEST TUBE INSERTION;  Surgeon: Jordis Laneta FALCON, MD;  Location: [facility];  Service: General;  Laterality: Right;   COLONOSCOPY N/A 06/23/2016   Procedure: COLONOSCOPY;  Surgeon: Lamar ONEIDA Holmes, MD;  Location: [facility] END[facility];  Service: Endoscopy;  Laterality: N/A;   COLONOSCOPY  2007   ESOPHAGOGASTRODUODENOSCOPY  03/03/2021   HARDWARE REMOVAL Right 11/06/2020   Procedure: EXCHANGE OF LAG SCREW, RIGHT HIP;  Surgeon: Edie Norleen PARAS, MD;  Location: [facility];  Service: Orthopedics;  Laterality: Right;   INSERTION OF MESH  04/07/2021   Procedure: INSERTION OF MESH;  Surgeon: Jordis Laneta FALCON, MD;  Location: [facility];  Service: General;;   INTRAMEDULLARY (IM) NAIL INTERTROCHANTERIC Right 12/26/2018   Procedure: INTRAMEDULLARY (IM) NAIL INTERTROCHANTRIC;  Surgeon: Edie Norleen PARAS, MD;  Location: [facility];  Service: Orthopedics;  Laterality: Right;   IR PERC PLEURAL DRAIN W/INDWELL CATH W/IMG GUIDE  04/19/2022   LAPAROSCOPIC NISSEN FUNDOPLICATION  04/07/2021   Procedure: LAPAROSCOPIC NISSEN FUNDOPLICATION;  Surgeon: Jordis Laneta FALCON, MD;  Location: [facility];  Service: General;;   TUBAL LIGATION  1970   XI ROBOTIC ASSISTED PARAESOPHAGEAL HERNIA REPAIR N/A 04/07/2021   Procedure: XI ROBOTIC ASSISTED PARAESOPHAGEAL HERNIA REPAIR with RNFA to assist;  Surgeon: Jordis Laneta FALCON, MD;  Location: [facility];  Service: General;  Laterality: N/A;   XI ROBOTIC  ASSISTED PARAESOPHAGEAL HERNIA REPAIR N/A 04/15/2022   Procedure: XI ROBOTIC ASSISTED PARAESOPHAGEAL HERNIA REPAIR, RNFA to assist;  Surgeon: Jordis Laneta FALCON, MD;  Location: [facility];  Service: General;  Laterality: N/A;   XI ROBOTIC ASSISTED PARAESOPHAGEAL HERNIA REPAIR N/A 04/15/2022   Procedure: XI ROBOTIC ASSISTED PARAESOPHAGEAL HERNIA REPAIR;  Surgeon: Jordis Laneta FALCON,  MD;  Location: [facility];  Service: General;  Laterality: N/A;     MEDICATIONS:  Prior to Admission medications   Medication Sig Start Date End Date Taking? Authorizing Provider  acetaminophen  (TYLENOL ) 500 MG tablet Take 2 tablets (1,000 mg total) by mouth every 8 (eight) hours. 05/17/23 05/16/24  Tobie Priest, MD  ACIDOPHILUS LACTOBACILLUS PO Take 1 capsule by mouth in the morning.    [provider]  albuterol  (VENTOLIN  HFA) 108 (90 Base) MCG/ACT inhaler Inhale 2 puffs into the lungs every 6 (six) hours as needed for wheezing or shortness of breath.    [provider]  alendronate  (FOSAMAX ) 70 MG tablet Take 70 mg by mouth every Monday. Take with a full glass of water  on an empty stomach.    [provider]  diltiazem  (CARDIZEM ) 90 MG tablet Take 90 mg by mouth 2 (two) times daily.    [provider]  doxycycline  (VIBRAMYCIN ) 100 MG capsule Take 1 capsule (100 mg total) by mouth 2 (two) times daily. 10/26/23   Brimage, Vondra, DO  hydrochlorothiazide  (HYDRODIURIL ) 25 MG tablet Take 25 mg by mouth daily. 06/12/23   [provider]  HYDROcodone -acetaminophen  (NORCO/VICODIN) 5-325 MG tablet Take 1 tablet by mouth every 4 (four) hours as needed for moderate pain (pain score 4-6). 05/17/23   Tobie Priest, MD  ibuprofen  (ADVIL ) 200 MG tablet Take 200 mg by mouth every 8 (eight) hours as needed (pain.).    [provider]  levothyroxine  (SYNTHROID ) 125 MCG tablet Take 62.5-125 mcg by mouth as directed. Take 0.5 tablet (62.5 mcg) by mouth every other day, alternating with 1 tablet (125 mcg) by mouth every other day    [provider]  Magnesium  100 MG TABS Take 100 mg by mouth every other day. In the morning    [provider]  melatonin 5 MG TABS Take 5 mg by mouth at bedtime.    [provider]  Multiple Vitamin (MULTIVITAMIN) capsule Take 1 capsule by mouth in the morning.    [provider]  naphazoline-pheniramine  (VISINE) 0.025-0.3 % ophthalmic solution Place 1-2 drops into both eyes 4 (four) times daily as needed for eye irritation.    [provider]  omeprazole (PRILOSEC) 20 MG capsule Take 20 mg by mouth daily before breakfast. 07/28/22   [provider]  ondansetron  (ZOFRAN -ODT) 4 MG disintegrating tablet Take 1 tablet (4 mg total) by mouth every 8 (eight) hours as needed for nausea or vomiting. 05/17/23   Tobie Priest, MD  pravastatin  (PRAVACHOL ) 80 MG tablet Take 80 mg by mouth every evening.    [provider]  tamsulosin (FLOMAX) 0.4 MG CAPS capsule Take 0.4 mg by mouth at bedtime.    [provider]  TRELEGY ELLIPTA 100-62.5-25 MCG/ACT AEPB Inhale 1 puff into the lungs daily in the afternoon. 01/18/23   [provider]     ALLERGIES:  Allergies  Allergen Reactions   Ativan  [Lorazepam ] Anxiety    Paranoia    Azithromycin Diarrhea   Levofloxacin Diarrhea    Pt prefers not to take because gi side effects   Sulfa Antibiotics Rash  SOCIAL HISTORY:  Social History   Socioeconomic History   Marital status: Married    Spouse name: Jimmy   Number of children: 2   Years of education: Not on file   Highest education level: Not on file  Occupational History   Not on file  Tobacco Use   Smoking status: Former    Current packs/day: 0.00    Types: Cigarettes    Quit date: 10/24/2006    Years since quitting: 17.2   Smokeless tobacco: Never  Vaping Use   Vaping status: Never Used  Substance and Sexual Activity   Alcohol use: Yes    Alcohol/week: 1.0 standard drink of alcohol    Types: 1 Glasses of wine per week    Comment: rarely   Drug use: Never   Sexual activity: Not on file  Other Topics Concern   Not on file  Social History Narrative   Not on file   Social Drivers of Health   Financial Resource Strain: Low Risk  (11/14/2023)   Received from [facility]   Overall Financial Resource Strain (CARDIA)    Difficulty  of Paying Living Expenses: Not hard at all  Food Insecurity: No Food Insecurity (11/14/2023)   Received from [facility]   Hunger Vital Sign    Within the past 12 months, you worried that your food would run out before you got the money to buy more.: Never true    Within the past 12 months, the food you bought just didn't last and you didn't have money to get more.: Never true  Transportation Needs: No Transportation Needs (11/14/2023)   Received from [facility] - Transportation    In the past 12 months, has lack of transportation kept you from medical appointments or from getting medications?: No    Lack of Transportation (Non-Medical): No  Physical Activity: Not on file  Stress: Not on file  Social Connections: Not on file  Intimate Partner Violence: Not At Risk (04/15/2022)   Humiliation, Afraid, Rape, and Kick questionnaire    Fear of Current or Ex-Partner: No    Emotionally Abused: No    Physically Abused: No    Sexually Abused: No     FAMILY HISTORY:  Family History  Problem Relation Age of Onset   Breast cancer Neg Hx       VITAL SIGNS:  Temp:  [97.9 F (36.6 C)] 97.9 F (36.6 C) (10/18 2232) Pulse Rate:  [71-85] 85 (10/19 0100) Resp:  [18-24] 24 (10/19 0100) BP: (137-174)/(83-118) 137/118 (10/19 0100) SpO2:  [93 %-100 %] 100 % (10/19 0100) Weight:  [55.8 kg] 55.8 kg (10/18 2225)     Height: 5' (152.4 cm) Weight: 55.8 kg BMI (Calculated): 24.02   INTAKE/OUTPUT:  No intake/output data recorded.  PHYSICAL EXAM:  Physical Exam Blood pressure (!) 137/118, pulse 85, temperature 97.9 F (36.6 C), temperature source Oral, resp. rate (!) 24, height 5' (1.524 m), weight 55.8 kg, SpO2 100%. Last Weight  Most recent update: 01/21/2024 10:26 PM    Weight  55.8 kg (123 lb)             CONSTITUTIONAL: Well developed, and nourished, appropriately responsive and aware without distress.   EYES: Sclera non-icteric.   EARS,  NOSE, MOUTH AND THROAT:  The oropharynx is clear. Oral mucosa is pink and moist.   Hearing is intact to voice.  NECK: Trachea is midline, and there is no jugular venous distension.  LYMPH NODES:  Lymph nodes in the neck are not enlarged. RESPIRATORY:   Normal respiratory effort without pathologic use of accessory muscles. CARDIOVASCULAR:  Well perfused.  GI: The abdomen is notable for a widemouth approximately 3+ to 4 cm diameter umbilical fascial defect, soft readily reducible nontender.  The remainder the abdomen is soft, nontender, and surprisingly nondistended, considering the appearance on CT scan. There were no palpable masses. I did not appreciate hepatosplenomegaly. There were normal bowel sounds. GU: I cannot appreciate a mass in the left inguinal area, consistent with any incarcerated hernia.  The left groin area is soft and nontender. MUSCULOSKELETAL:  Symmetrical muscle tone appreciated in all four extremities. Warm without edema.  SKIN: Skin turgor is normal. No pathologic skin lesions appreciated.  NEUROLOGIC:  Motor and sensation appear grossly normal.  Cranial nerves are grossly without defect. PSYCH:  Alert and oriented to person, place and time. Affect is appropriate for situation.  Data Reviewed I have personally reviewed what is currently available of the patient's imaging, recent labs and medical records.    Labs:     Latest Ref Rng & Units 01/21/2024   11:04 PM 04/28/2022    6:04 AM 04/27/2022    3:45 AM  CBC  WBC 4.0 - 10.5 K/uL 11.7  22.5  24.3   Hemoglobin 12.0 - 15.0 g/dL 87.9  9.6  9.4   Hematocrit 36.0 - 46.0 % 37.5  29.5  29.5   Platelets 150 - 400 K/uL 198  839  644       Latest Ref Rng & Units 01/21/2024   11:04 PM 04/28/2022    6:04 AM 04/27/2022    3:45 AM  CMP  Glucose 70 - 99 mg/dL 858  877  899   BUN 8 - 23 mg/dL 30  23  19    Creatinine 0.44 - 1.00 mg/dL 9.37  9.22  9.43   Sodium 135 - 145 mmol/L 140  132  130   Potassium 3.5 - 5.1 mmol/L 3.5  4.1   3.9   Chloride 98 - 111 mmol/L 104  95  93   CO2 22 - 32 mmol/L 25  25  27    Calcium 8.9 - 10.3 mg/dL 9.0  7.9  7.8   Total Protein 6.5 - 8.1 g/dL 6.8  6.5  6.1   Total Bilirubin 0.0 - 1.2 mg/dL 0.2  0.6  0.5   Alkaline Phos 38 - 126 U/L 56  182  171   AST 15 - 41 U/L 25  25  32   ALT 0 - 44 U/L 22  28  36      Imaging studies:   Last 24 hrs: CT ABDOMEN PELVIS W CONTRAST Result Date: 01/21/2024 CLINICAL DATA:  Abdominal pain, hernia, nausea EXAM: CT ABDOMEN AND PELVIS WITH CONTRAST TECHNIQUE: Multidetector CT imaging of the abdomen and pelvis was performed using the standard protocol following bolus administration of intravenous contrast. RADIATION DOSE REDUCTION: This exam was performed according to the departmental dose-optimization program which includes automated exposure control, adjustment of the mA and/or kV according to patient size and/or use of iterative reconstruction technique. CONTRAST:  OMNIPAQUE  IOHEXOL  300 MG/ML  SOLN COMPARISON:  Went 1924 FINDINGS: Lower chest: Emphysema and bibasilar scarring. No acute pleural or parenchymal lung disease. Moderate hiatal hernia. Hepatobiliary: No focal liver abnormality is seen. No gallstones, gallbladder wall thickening, or biliary dilatation. Pancreas: Unremarkable. No pancreatic ductal dilatation or surrounding inflammatory changes. Spleen: Normal in size without focal abnormality.  Adrenals/Urinary Tract: Adrenal glands are unremarkable. Kidneys are normal, without renal calculi, focal lesion, or hydronephrosis. Bladder is unremarkable. Stomach/Bowel: There is a left inguinal hernia containing a portion of the sigmoid colon. There is marked distension of the small and large bowel proximal to this region. Maximal diameter of the small bowel measures up to 3.3 cm, and maximal diameter of the colon measures up to 9.0 cm. The rectosigmoid colon beyond the left inguinal hernia is decompressed. No bowel wall thickening or inflammatory change.  There is a large midline umbilical hernia containing multiple loops of dilated small bowel. Moderate hiatal hernia is also noted. Diverticulosis of the rectosigmoid colon without evidence of acute diverticulitis. Vascular/Lymphatic: Aortic atherosclerosis. No enlarged abdominal or pelvic lymph nodes. Reproductive: Uterus and bilateral adnexa are unremarkable. Other: No free fluid or free intraperitoneal gas. Large umbilical hernia and left inguinal hernia as described above. Musculoskeletal: No acute or destructive bony abnormalities. Prior ORIF of the right hip. Reconstructed images demonstrate no additional findings. IMPRESSION: 1. Distal colonic obstruction at the site of a left inguinal hernia containing a segment of the sigmoid colon. Marked gaseous distension of the proximal colon and small bowel. No evidence of bowel ischemia. 2. Large umbilical hernia containing multiple loops of dilated small bowel. No obstruction at this location. 3. Moderate hiatal hernia. 4. Distal colonic diverticulosis without diverticulitis. 5. Aortic Atherosclerosis (ICD10-I70.0) and Emphysema (ICD10-J43.9). Electronically Signed   By: Ozell Daring M.D.   On: 01/21/2024 23:59     Assessment/Plan:  83 y.o. female with significant large and small bowel dilatation with gas likely secondary to transiently incarcerated left groin hernia, now clinically reduced, with significant improvement in pain symptoms which she presented.   Patient Active Problem List   Diagnosis Date Noted   Atrial tachycardia 04/22/2022   Hiatal hernia 04/22/2022   SVT (supraventricular tachycardia) 04/21/2022   Esophageal perforation 04/15/2022   S/P repair of paraesophageal hernia 04/07/2021   Pain from implanted hardware 11/06/2020   Lumbar spondylosis 03/10/2020   HTN, goal below 140/80 08/09/2019   Trochanteric bursitis, right hip 06/27/2019   Cataract cortical, senile 05/11/2019   GERD (gastroesophageal reflux disease) 05/11/2019    Hyperlipidemia 05/11/2019   Shingles 05/11/2019   Thyroid disease 05/11/2019   Closed right hip fracture (HCC) 12/26/2018   Anemia due to vitamin B12 deficiency 11/06/2013    - Due to the apparent elective nature of pursuing these hernia repairs, the patient and her husband would like to follow-up with their [facility] surgery department that they had a great experience with on her last hiatal hernia surgical adventures.  - I believe the most important part of her care either inpatient or out would be that she tolerates the clear liquids, allow her bowels to rest but also get aggressive with a MiraLAX and Gatorade regimen to ensure that she is no longer straining with her bowel activity.  - She may also benefit from fiber supplementation.  - An abdominal binder may also prevent further transient dilation of the small bowel present within her umbilical hernia, and provide support and perhaps additional comfort. All of the above findings and recommendations were discussed with the patient and  family(if present), and all of patient's and present family's questions were answered to their expressed satisfaction.  I personally spent a total of 50 minutes in the care of the patient today including preparing to see the patient, getting/reviewing separately obtained history, performing a medically appropriate exam/evaluation, counseling and educating, placing orders, referring and communicating with  other health care professionals, documenting clinical information in the EHR, independently interpreting results, communicating results, and coordinating care.    Thank you for the opportunity to participate in this patient's care.   -- Honor Leghorn, M.D., FACS 01/22/2024, 2:05 AM
